# Patient Record
Sex: Female | Born: 1950 | Race: White | Hispanic: No | Smoking: Never smoker
Health system: Southern US, Community
[De-identification: ages and names within clinical notes are randomized; demographics above are authoritative.]

## PROBLEM LIST (undated history)

## (undated) DIAGNOSIS — R232 Flushing: Secondary | ICD-10-CM

## (undated) DIAGNOSIS — R55 Syncope and collapse: Secondary | ICD-10-CM

## (undated) DIAGNOSIS — R5383 Other fatigue: Secondary | ICD-10-CM

## (undated) DIAGNOSIS — M1990 Unspecified osteoarthritis, unspecified site: Secondary | ICD-10-CM

## (undated) DIAGNOSIS — N289 Disorder of kidney and ureter, unspecified: Secondary | ICD-10-CM

## (undated) DIAGNOSIS — Z9289 Personal history of other medical treatment: Secondary | ICD-10-CM

## (undated) DIAGNOSIS — G4730 Sleep apnea, unspecified: Secondary | ICD-10-CM

## (undated) DIAGNOSIS — IMO0002 Reserved for concepts with insufficient information to code with codable children: Secondary | ICD-10-CM

## (undated) DIAGNOSIS — D649 Anemia, unspecified: Secondary | ICD-10-CM

## (undated) DIAGNOSIS — Z87440 Personal history of urinary (tract) infections: Secondary | ICD-10-CM

## (undated) DIAGNOSIS — C801 Malignant (primary) neoplasm, unspecified: Secondary | ICD-10-CM

## (undated) DIAGNOSIS — K56609 Unspecified intestinal obstruction, unspecified as to partial versus complete obstruction: Secondary | ICD-10-CM

## (undated) DIAGNOSIS — Z932 Ileostomy status: Secondary | ICD-10-CM

## (undated) DIAGNOSIS — R9439 Abnormal result of other cardiovascular function study: Secondary | ICD-10-CM

## (undated) DIAGNOSIS — C50919 Malignant neoplasm of unspecified site of unspecified female breast: Secondary | ICD-10-CM

## (undated) DIAGNOSIS — M419 Scoliosis, unspecified: Secondary | ICD-10-CM

## (undated) DIAGNOSIS — K631 Perforation of intestine (nontraumatic): Secondary | ICD-10-CM

## (undated) DIAGNOSIS — M75100 Unspecified rotator cuff tear or rupture of unspecified shoulder, not specified as traumatic: Secondary | ICD-10-CM

## (undated) DIAGNOSIS — T4145XA Adverse effect of unspecified anesthetic, initial encounter: Secondary | ICD-10-CM

## (undated) DIAGNOSIS — Z8719 Personal history of other diseases of the digestive system: Secondary | ICD-10-CM

## (undated) DIAGNOSIS — I10 Essential (primary) hypertension: Secondary | ICD-10-CM

## (undated) DIAGNOSIS — Z923 Personal history of irradiation: Secondary | ICD-10-CM

## (undated) DIAGNOSIS — I809 Phlebitis and thrombophlebitis of unspecified site: Secondary | ICD-10-CM

## (undated) DIAGNOSIS — I2699 Other pulmonary embolism without acute cor pulmonale: Secondary | ICD-10-CM

## (undated) HISTORY — PX: CARDIAC CATHETERIZATION: SHX172

## (undated) HISTORY — DX: Sleep apnea, unspecified: G47.30

## (undated) HISTORY — PX: JOINT REPLACEMENT: SHX530

## (undated) HISTORY — DX: Phlebitis and thrombophlebitis of unspecified site: I80.9

## (undated) HISTORY — PX: APPENDECTOMY: SHX54

## (undated) HISTORY — DX: Scoliosis, unspecified: M41.9

## (undated) HISTORY — DX: Perforation of intestine (nontraumatic): K63.1

## (undated) HISTORY — PX: OTHER SURGICAL HISTORY: SHX169

## (undated) HISTORY — PX: BREAST BIOPSY: SHX20

## (undated) HISTORY — DX: Malignant neoplasm of unspecified site of unspecified female breast: C50.919

## (undated) HISTORY — DX: Flushing: R23.2

## (undated) HISTORY — DX: Unspecified intestinal obstruction, unspecified as to partial versus complete obstruction: K56.609

## (undated) HISTORY — PX: EXPLORATORY LAPAROTOMY: SUR591

## (undated) HISTORY — DX: Unspecified rotator cuff tear or rupture of unspecified shoulder, not specified as traumatic: M75.100

## (undated) HISTORY — DX: Personal history of other medical treatment: Z92.89

## (undated) HISTORY — PX: TOTAL SHOULDER ARTHROPLASTY: SHX126

## (undated) HISTORY — DX: Personal history of other diseases of the digestive system: Z87.19

## (undated) HISTORY — DX: Other pulmonary embolism without acute cor pulmonale: I26.99

## (undated) HISTORY — DX: Other fatigue: R53.83

## (undated) HISTORY — PX: CATARACT EXTRACTION: SUR2

## (undated) HISTORY — DX: Malignant (primary) neoplasm, unspecified: C80.1

## (undated) HISTORY — PX: KNEE ARTHROPLASTY: SHX992

## (undated) HISTORY — DX: Unspecified osteoarthritis, unspecified site: M19.90

---

## 1969-04-24 DIAGNOSIS — I809 Phlebitis and thrombophlebitis of unspecified site: Secondary | ICD-10-CM

## 1969-04-24 DIAGNOSIS — I2699 Other pulmonary embolism without acute cor pulmonale: Secondary | ICD-10-CM

## 1969-04-24 HISTORY — DX: Phlebitis and thrombophlebitis of unspecified site: I80.9

## 1969-04-24 HISTORY — DX: Other pulmonary embolism without acute cor pulmonale: I26.99

## 1971-04-25 HISTORY — PX: COLECTOMY: SHX59

## 1971-04-25 HISTORY — PX: ILEOSTOMY: SHX1783

## 1973-04-24 HISTORY — PX: VAGINOPLASTY: SHX329

## 1994-04-24 DIAGNOSIS — T8859XA Other complications of anesthesia, initial encounter: Secondary | ICD-10-CM

## 1994-04-24 HISTORY — DX: Other complications of anesthesia, initial encounter: T88.59XA

## 1994-04-24 HISTORY — PX: ABDOMINAL HYSTERECTOMY: SHX81

## 1997-07-17 ENCOUNTER — Ambulatory Visit (HOSPITAL_COMMUNITY): Admission: RE | Admit: 1997-07-17 | Discharge: 1997-07-17 | Payer: Self-pay | Admitting: Obstetrics and Gynecology

## 1998-08-24 ENCOUNTER — Encounter: Payer: Self-pay | Admitting: Obstetrics and Gynecology

## 1998-08-24 ENCOUNTER — Ambulatory Visit (HOSPITAL_COMMUNITY): Admission: RE | Admit: 1998-08-24 | Discharge: 1998-08-24 | Payer: Self-pay | Admitting: Obstetrics and Gynecology

## 1999-08-31 ENCOUNTER — Ambulatory Visit (HOSPITAL_COMMUNITY): Admission: RE | Admit: 1999-08-31 | Discharge: 1999-08-31 | Payer: Self-pay | Admitting: *Deleted

## 1999-08-31 ENCOUNTER — Encounter: Payer: Self-pay | Admitting: Internal Medicine

## 2000-10-12 ENCOUNTER — Encounter: Payer: Self-pay | Admitting: Obstetrics and Gynecology

## 2000-10-12 ENCOUNTER — Ambulatory Visit (HOSPITAL_COMMUNITY): Admission: RE | Admit: 2000-10-12 | Discharge: 2000-10-12 | Payer: Self-pay | Admitting: Obstetrics and Gynecology

## 2002-11-25 ENCOUNTER — Encounter: Payer: Self-pay | Admitting: Internal Medicine

## 2002-11-25 ENCOUNTER — Ambulatory Visit (HOSPITAL_COMMUNITY): Admission: RE | Admit: 2002-11-25 | Discharge: 2002-11-25 | Payer: Self-pay | Admitting: Internal Medicine

## 2003-04-25 DIAGNOSIS — M75100 Unspecified rotator cuff tear or rupture of unspecified shoulder, not specified as traumatic: Secondary | ICD-10-CM

## 2003-04-25 HISTORY — DX: Unspecified rotator cuff tear or rupture of unspecified shoulder, not specified as traumatic: M75.100

## 2004-04-24 HISTORY — PX: ROTATOR CUFF REPAIR: SHX139

## 2004-10-04 ENCOUNTER — Ambulatory Visit (HOSPITAL_COMMUNITY): Admission: RE | Admit: 2004-10-04 | Discharge: 2004-10-04 | Payer: Self-pay | Admitting: Obstetrics and Gynecology

## 2004-12-31 ENCOUNTER — Inpatient Hospital Stay (HOSPITAL_COMMUNITY): Admission: RE | Admit: 2004-12-31 | Discharge: 2005-01-02 | Payer: Self-pay | Admitting: Orthopedic Surgery

## 2006-04-04 ENCOUNTER — Ambulatory Visit (HOSPITAL_COMMUNITY): Admission: RE | Admit: 2006-04-04 | Discharge: 2006-04-04 | Payer: Self-pay | Admitting: Obstetrics and Gynecology

## 2007-04-25 DIAGNOSIS — G4730 Sleep apnea, unspecified: Secondary | ICD-10-CM

## 2007-04-25 HISTORY — DX: Sleep apnea, unspecified: G47.30

## 2007-06-11 ENCOUNTER — Ambulatory Visit (HOSPITAL_COMMUNITY): Admission: RE | Admit: 2007-06-11 | Discharge: 2007-06-11 | Payer: Self-pay | Admitting: Obstetrics and Gynecology

## 2009-01-07 ENCOUNTER — Ambulatory Visit (HOSPITAL_COMMUNITY): Admission: RE | Admit: 2009-01-07 | Discharge: 2009-01-07 | Payer: Self-pay | Admitting: Internal Medicine

## 2010-02-09 ENCOUNTER — Ambulatory Visit (HOSPITAL_COMMUNITY): Admission: RE | Admit: 2010-02-09 | Discharge: 2010-02-09 | Payer: Self-pay | Admitting: Obstetrics and Gynecology

## 2010-04-24 DIAGNOSIS — Z9289 Personal history of other medical treatment: Secondary | ICD-10-CM

## 2010-04-24 HISTORY — DX: Personal history of other medical treatment: Z92.89

## 2010-05-15 ENCOUNTER — Encounter: Payer: Self-pay | Admitting: Obstetrics and Gynecology

## 2010-09-09 NOTE — Op Note (Signed)
NAMELAKEDRA, WASHINGTON            ACCOUNT NO.:  [id number]   MEDICAL RECORD NO.:  [id number]          PATIENT TYPE:  AMB   LOCATION:  DAY                          FACILITY:  [facility]   PHYSICIAN:  Tarri Glenn, M.D.  DATE OF BIRTH:  [birth year]   DATE OF PROCEDURE:  12/30/2004  DATE OF DISCHARGE:                                 OPERATIVE REPORT   PREOPERATIVE DIAGNOSES:  1.  Torn rotator cuff.  2.  Degenerative arthritis acromioclavicular joint, right shoulder.   POSTOPERATIVE DIAGNOSES:  1.  Torn rotator cuff.  2.  Degenerative arthritis acromioclavicular joint, right shoulder.   OPERATION:  1.  Anterior acromionectomy and repair of torn rotator cuff.  2.  Open resection distal clavicle right shoulder.   SURGEON:  Tarri Glenn, M.D.   ASSISTANTElodia Florence. Delorse Lek, P.A.-.C.   ANESTHESIA:  General.   INDICATIONS FOR PROCEDURE:  Because of a painful right shoulder, he obtained  an MRI demonstrating the above findings.  The 2 cm of retraction of the  supraspinous tendon were accurate.   DESCRIPTION OF PROCEDURE:  Prophylactic antibiotic, satisfactory general  anesthesia, beach-chair position on the swine frame, right shoulder girdle  was prepped with DuraPrep and draped in a sterile field.  Ioban employed.  A  vertical incision over the distal clavicle curving downward to roughly the  greater tuberosity.  I opened the fascia over the distal clavicle after  identifying the [facility] joint with cutting cautery and measured out the distal 1  to 1.5 cm.  I then undermined the clavicle at this point and protected the  underlying tissues and amputated the clavicle with a microsaw.  Small  spicules of bone were removed from the cut surface which I covered with bone  wax and irrigated well.  I then continued lateralward and undermined the  distal acromion with a small Cobb elevator followed by a larger Cobb  elevator, then protecting the underlying rotator cuff tissues using  microsaw  to make my initial anterior acromionectomy.  I then performed additional  beveling of the underneath surface and also used a small rongeur until she  had a wide decompression.  The subdeltoid bursa was  excised and the large  tear identified.  It was about 1 to 1.5 cm in width and 2 cm of retraction.  The biceps tendon was intact.  After identifying the anatomy of the tear, I  roughened up the humeral head next to the greater tuberosity and used two  super Mitek anchors to splice through the torn retraction portion of the  tear and with the arm abducted reattached the rotator cuff to its original  position and then supplemented the sutures with additional lateral sutures  through the end of the rotator cuff and soft tissue in the lateral humerus.  This gave a nice smooth tight repair.  We checked to be sure there was no  additional impingement.  She had an interscalene block preoperatively and  did not use any Marcaine with Adrenaline.  The wound was irrigated with  sterile saline. Closure performed.  The deltoid muscle and fascia over the  anterior  acromion and distal clavicle was reapproximated with interrupted #1  Vicryl after placing Gelfoam in the resected bone interval.  The  subcutaneous tissue was closed with 2-0 Vicryl, skin with Steri-Strips.  Dry  sterile dressing and short immobilizer were applied.  She tolerated the  procedure well and was taken to recovery in satisfactory condition with no  known complications.           ______________________________  Tarri Glenn, M.D.     JA/MEDQ  D:  12/30/2004  T:  12/30/2004  Job:  [id number]

## 2010-09-09 NOTE — Discharge Summary (Signed)
Deanna Burke, Deanna Burke            ACCOUNT NO.:  [id number]   MEDICAL RECORD NO.:  [id number]          PATIENT TYPE:  INP   LOCATION:  [place]                         FACILITY:  [facility]   PHYSICIAN:  Tarri Glenn, M.D.  DATE OF BIRTH:  [birth year]   DATE OF ADMISSION:  12/30/2004  DATE OF DISCHARGE:  01/02/2005                                 DISCHARGE SUMMARY   ADMISSION DIAGNOSES:  1.  Torn rotator cuff of the right shoulder with degenerative arthritis of      the acromioclavicular joint and chronic, painful impingement to the      right shoulder.  2.  Hypertension.  3.  History of phlebitis.  4.  History of anemia.  5.  Ulcerative colitis.  6.  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  7.  Multiple allergies.   DISCHARGE DIAGNOSES:  1.  Torn rotator cuff of the right shoulder with degenerative arthritis of      the acromioclavicular joint and chronic, painful impingement to the      right shoulder.  2.  Hypertension.  3.  History of phlebitis.  4.  History of anemia.  5.  Ulcerative colitis.  6.  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  7.  Multiple allergies.   OPERATION:  On December 30, 2004, patient underwent repair of the rotator  cuff of the right shoulder, distal clavicle resection of the right shoulder,  and subacromial decompression, all open.  D.L. Underwood assisted.   BRIEF HISTORY:  This 60 year old lady having progressive problems concerning  pain into her right shoulder had interference with her day-to-day activity  due to this pain, lack of range of motion.  After much consideration,  including the findings on MI of torn rotator cuff as well as impingement and  AC arthritis, it was decided to go ahead with the above procedure.   HOSPITAL COURSE:  Patient tolerated the surgical procedure quite well.  She  had a regional block as well as general anesthetic.  Once the block wore  off, however, she had a marked amount of pain and  discomfort.  Intravenous  analgesics were used.  Morphine as well as Demerol IM.  Once her pain was  under control with p.o. analgesics, it was decided that she could be  returned home safely.  The wound was dry.  Neurovascular was intact to the  right upper extremity.  She had instructions from occupational therapy.  She  will not range motion of the right shoulder.  She will return to see [place] in  our office in 10-14 days.  May shower four days after surgery.  Continue  home medications and diet.   Laboratory values in the hospital hematologically showed a CBC with  differential.  The hemoglobin and hematocrit were normal.  Blood chemistries  were also normal.   Chest x-ray showed no active cardiopulmonary disease.   Electrocardiogram showed normal sinus rhythm.   CONDITION ON DISCHARGE:  Improved, stable.   PLAN:  The patient is discharged home to the care of her family.  She is to  continue with her home medications and  diet.  Vicodin will be used for  discomfort.  Robaxin as a muscle relaxer.  She is suggested to call should  she have any problems.      Dooley L. Vanita Ingles.    ______________________________  Tarri Glenn, M.D.    DLU/MEDQ  D:  01/02/2005  T:  01/02/2005  Job:  [id number]   cc:   Theda Belfast. Baird Cancer, M.D.  [address]  [place] [address]  Fax: [phone]

## 2011-02-17 ENCOUNTER — Other Ambulatory Visit (HOSPITAL_COMMUNITY): Payer: Self-pay | Admitting: Obstetrics and Gynecology

## 2011-02-17 DIAGNOSIS — Z1231 Encounter for screening mammogram for malignant neoplasm of breast: Secondary | ICD-10-CM

## 2011-03-22 ENCOUNTER — Ambulatory Visit (HOSPITAL_COMMUNITY)
Admission: RE | Admit: 2011-03-22 | Discharge: 2011-03-22 | Disposition: A | Discharge status: Home / Self Care | Payer: PRIVATE HEALTH INSURANCE | Source: Ambulatory Visit | Attending: Obstetrics and Gynecology | Admitting: Obstetrics and Gynecology

## 2011-03-22 DIAGNOSIS — Z1231 Encounter for screening mammogram for malignant neoplasm of breast: Secondary | ICD-10-CM

## 2011-03-24 ENCOUNTER — Other Ambulatory Visit: Payer: Self-pay | Admitting: Obstetrics and Gynecology

## 2011-03-24 DIAGNOSIS — R928 Other abnormal and inconclusive findings on diagnostic imaging of breast: Secondary | ICD-10-CM

## 2011-04-07 ENCOUNTER — Other Ambulatory Visit: Payer: Self-pay | Admitting: Obstetrics and Gynecology

## 2011-04-07 ENCOUNTER — Ambulatory Visit
Admission: RE | Admit: 2011-04-07 | Discharge: 2011-04-07 | Disposition: A | Discharge status: Home / Self Care | Payer: PRIVATE HEALTH INSURANCE | Source: Ambulatory Visit | Attending: Obstetrics and Gynecology | Admitting: Obstetrics and Gynecology

## 2011-04-07 DIAGNOSIS — R928 Other abnormal and inconclusive findings on diagnostic imaging of breast: Secondary | ICD-10-CM

## 2011-04-19 ENCOUNTER — Ambulatory Visit
Admission: RE | Admit: 2011-04-19 | Discharge: 2011-04-19 | Disposition: A | Discharge status: Home / Self Care | Payer: PRIVATE HEALTH INSURANCE | Source: Ambulatory Visit | Attending: Obstetrics and Gynecology | Admitting: Obstetrics and Gynecology

## 2011-04-19 DIAGNOSIS — R928 Other abnormal and inconclusive findings on diagnostic imaging of breast: Secondary | ICD-10-CM

## 2011-04-20 ENCOUNTER — Other Ambulatory Visit: Payer: Self-pay | Admitting: Obstetrics and Gynecology

## 2011-04-20 DIAGNOSIS — C50912 Malignant neoplasm of unspecified site of left female breast: Secondary | ICD-10-CM

## 2011-04-21 ENCOUNTER — Other Ambulatory Visit: Payer: Self-pay | Admitting: *Deleted

## 2011-04-21 ENCOUNTER — Telehealth: Payer: Self-pay | Admitting: *Deleted

## 2011-04-21 DIAGNOSIS — D0510 Intraductal carcinoma in situ of unspecified breast: Secondary | ICD-10-CM

## 2011-04-21 NOTE — Telephone Encounter (Signed)
Confirmed [facility] for 05/03/11 at 1215.  Instructions and contact information given.

## 2011-04-27 ENCOUNTER — Ambulatory Visit
Admission: RE | Admit: 2011-04-27 | Discharge: 2011-04-27 | Disposition: A | Discharge status: Home / Self Care | Payer: PRIVATE HEALTH INSURANCE | Source: Ambulatory Visit | Attending: Obstetrics and Gynecology | Admitting: Obstetrics and Gynecology

## 2011-04-27 DIAGNOSIS — C50912 Malignant neoplasm of unspecified site of left female breast: Secondary | ICD-10-CM

## 2011-04-27 MED ORDER — GADOBENATE DIMEGLUMINE 529 MG/ML IV SOLN
15.0000 mL | Freq: Once | INTRAVENOUS | Status: AC | PRN
  Administered 2011-04-27: 15 mL via INTRAVENOUS

## 2011-04-28 ENCOUNTER — Other Ambulatory Visit: Payer: Self-pay | Admitting: Obstetrics and Gynecology

## 2011-04-28 DIAGNOSIS — R928 Other abnormal and inconclusive findings on diagnostic imaging of breast: Secondary | ICD-10-CM

## 2011-05-03 ENCOUNTER — Other Ambulatory Visit: Payer: Self-pay | Admitting: Diagnostic Radiology

## 2011-05-03 ENCOUNTER — Ambulatory Visit: Payer: PRIVATE HEALTH INSURANCE

## 2011-05-03 ENCOUNTER — Ambulatory Visit: Payer: PRIVATE HEALTH INSURANCE | Attending: Surgery | Admitting: Physical Therapy

## 2011-05-03 ENCOUNTER — Ambulatory Visit (HOSPITAL_BASED_OUTPATIENT_CLINIC_OR_DEPARTMENT_OTHER): Payer: PRIVATE HEALTH INSURANCE | Admitting: Surgery

## 2011-05-03 ENCOUNTER — Encounter: Payer: Self-pay | Admitting: *Deleted

## 2011-05-03 ENCOUNTER — Ambulatory Visit
Admission: RE | Admit: 2011-05-03 | Discharge: 2011-05-03 | Disposition: A | Discharge status: Home / Self Care | Payer: PRIVATE HEALTH INSURANCE | Source: Ambulatory Visit | Attending: Obstetrics and Gynecology | Admitting: Obstetrics and Gynecology

## 2011-05-03 ENCOUNTER — Ambulatory Visit (HOSPITAL_BASED_OUTPATIENT_CLINIC_OR_DEPARTMENT_OTHER): Payer: PRIVATE HEALTH INSURANCE | Admitting: Oncology

## 2011-05-03 ENCOUNTER — Encounter: Payer: Self-pay | Admitting: Specialist

## 2011-05-03 ENCOUNTER — Ambulatory Visit
Admission: RE | Admit: 2011-05-03 | Discharge: 2011-05-03 | Disposition: A | Discharge status: Home / Self Care | Payer: PRIVATE HEALTH INSURANCE | Source: Ambulatory Visit | Attending: Radiation Oncology | Admitting: Radiation Oncology

## 2011-05-03 ENCOUNTER — Other Ambulatory Visit (HOSPITAL_BASED_OUTPATIENT_CLINIC_OR_DEPARTMENT_OTHER): Payer: PRIVATE HEALTH INSURANCE | Admitting: Lab

## 2011-05-03 VITALS — BP 144/78 | HR 103 | Temp 97.9°F | Ht 65.0 in | Wt 172.8 lb

## 2011-05-03 DIAGNOSIS — D0590 Unspecified type of carcinoma in situ of unspecified breast: Secondary | ICD-10-CM

## 2011-05-03 DIAGNOSIS — R928 Other abnormal and inconclusive findings on diagnostic imaging of breast: Secondary | ICD-10-CM

## 2011-05-03 DIAGNOSIS — Z7982 Long term (current) use of aspirin: Secondary | ICD-10-CM | POA: Insufficient documentation

## 2011-05-03 DIAGNOSIS — Z170 Estrogen receptor positive status [ER+]: Secondary | ICD-10-CM

## 2011-05-03 DIAGNOSIS — G4730 Sleep apnea, unspecified: Secondary | ICD-10-CM | POA: Insufficient documentation

## 2011-05-03 DIAGNOSIS — D0510 Intraductal carcinoma in situ of unspecified breast: Secondary | ICD-10-CM

## 2011-05-03 DIAGNOSIS — Z79899 Other long term (current) drug therapy: Secondary | ICD-10-CM | POA: Insufficient documentation

## 2011-05-03 DIAGNOSIS — Z01818 Encounter for other preprocedural examination: Secondary | ICD-10-CM | POA: Insufficient documentation

## 2011-05-03 DIAGNOSIS — R293 Abnormal posture: Secondary | ICD-10-CM | POA: Insufficient documentation

## 2011-05-03 DIAGNOSIS — Z803 Family history of malignant neoplasm of breast: Secondary | ICD-10-CM | POA: Insufficient documentation

## 2011-05-03 DIAGNOSIS — Z86711 Personal history of pulmonary embolism: Secondary | ICD-10-CM | POA: Insufficient documentation

## 2011-05-03 DIAGNOSIS — M79609 Pain in unspecified limb: Secondary | ICD-10-CM | POA: Insufficient documentation

## 2011-05-03 DIAGNOSIS — Z510 Encounter for antineoplastic radiation therapy: Secondary | ICD-10-CM | POA: Insufficient documentation

## 2011-05-03 DIAGNOSIS — C50919 Malignant neoplasm of unspecified site of unspecified female breast: Secondary | ICD-10-CM | POA: Insufficient documentation

## 2011-05-03 DIAGNOSIS — Z800 Family history of malignant neoplasm of digestive organs: Secondary | ICD-10-CM | POA: Insufficient documentation

## 2011-05-03 HISTORY — PX: BREAST SURGERY: SHX581

## 2011-05-03 LAB — CBC WITH DIFFERENTIAL/PLATELET
Eosinophils Absolute: 0.1 10*3/uL (ref 0.0–0.5)
HCT: 37 % (ref 34.8–46.6)
MCV: 89.8 fL (ref 79.5–101.0)
MONO#: 0.3 10*3/uL (ref 0.1–0.9)
NEUT#: 4.6 10*3/uL (ref 1.5–6.5)
NEUT%: 78.9 % — ABNORMAL HIGH (ref 38.4–76.8)
Platelets: 279 10*3/uL (ref 145–400)
RDW: 11.9 % (ref 11.2–14.5)
lymph#: 0.9 10*3/uL (ref 0.9–3.3)

## 2011-05-03 LAB — COMPREHENSIVE METABOLIC PANEL
Albumin: 3.7 g/dL (ref 3.5–5.2)
Alkaline Phosphatase: 83 U/L (ref 39–117)
BUN: 29 mg/dL — ABNORMAL HIGH (ref 6–23)
CO2: 27 mEq/L (ref 19–32)
Glucose, Bld: 107 mg/dL — ABNORMAL HIGH (ref 70–99)
Potassium: 3.6 mEq/L (ref 3.5–5.3)

## 2011-05-03 MED ORDER — GADOBENATE DIMEGLUMINE 529 MG/ML IV SOLN: 14.0000 mL | Freq: Once | INTRAVENOUS | Status: DC | PRN

## 2011-05-03 NOTE — Progress Notes (Signed)
Met pt at multi-disciplinary clinic.  Explained available support services, including breast cancer support group and [facility].

## 2011-05-03 NOTE — Progress Notes (Signed)
Patient ID: Deanna Burke, female   DOB: 1951-01-01, 61 y.o.   MRN: [id number]  Chief Complaint  Patient presents with  . Other    DCIS left breast    HPI Deanna Burke is a 61 y.o. female.  HPIThis is a pleasant female referred by Deanna Chessman PA for evaluation of a recent finding of ductal carcinoma in situ of the left breast. She actually has a separate lesion it has been demonstrated on MRI of the left breast and that lesion was just biopsied today. She has no previous history of breast biopsies or breast malignancies. She denies any nipple discharge. She is otherwise without complaints.  No past medical history on file.  No past surgical history on file.  No family history on file.  Social History History  Substance Use Topics  . Smoking status: Not on file  . Smokeless tobacco: Not on file  . Alcohol Use: Not on file    Allergies  Allergen Reactions  . Demerol Nausea And Vomiting  . Dilaudid (Hydromorphone Hcl) Nausea And Vomiting  . Stadol (Butorphanol Tartrate) Nausea And Vomiting  . Amoxicillin Rash  . Keflex Rash  . Penicillins Rash    No current outpatient prescriptions on file.   No current facility-administered medications for this visit.   Facility-Administered Medications Ordered in Other Visits  Medication Dose Route Frequency Provider Last Rate Last Dose  . gadobenate dimeglumine (MULTIHANCE) injection 14 mL  14 mL Intravenous Once PRN Medication Radiologist        Review of Systems Review of Systems  Constitutional: Negative for fever, chills and unexpected weight change.  HENT: Negative for hearing loss, congestion, sore throat, trouble swallowing and voice change.   Eyes: Negative for visual disturbance.  Respiratory: Negative for cough and wheezing.   Cardiovascular: Negative for chest pain, palpitations and leg swelling.  Gastrointestinal: Negative for nausea, vomiting, abdominal pain, diarrhea, constipation, blood in stool, abdominal  distention and anal bleeding.  Genitourinary: Positive for dysuria. Negative for hematuria, vaginal bleeding and difficulty urinating.  Musculoskeletal: Positive for back pain and joint swelling. Negative for arthralgias.  Skin: Negative for rash and wound.  Neurological: Negative for seizures, syncope and headaches.  Hematological: Negative for adenopathy. Does not bruise/bleed easily.  Psychiatric/Behavioral: Negative for confusion.    There were no vitals taken for this visit.  Physical Exam Physical Exam  Constitutional: She is oriented to person, place, and time. She appears well-developed and well-nourished. No distress.  HENT:  Head: Normocephalic and atraumatic.  Right Ear: External ear normal.  Left Ear: External ear normal.  Nose: Nose normal.  Mouth/Throat: Oropharynx is clear and moist. No oropharyngeal exudate.  Eyes: Conjunctivae are normal. Pupils are equal, round, and reactive to light. Right eye exhibits no discharge. Left eye exhibits no discharge. No scleral icterus.  Neck: Normal range of motion. Neck supple. No tracheal deviation present. No thyromegaly present.  Cardiovascular: Normal rate, regular rhythm, normal heart sounds and intact distal pulses.   No murmur heard. Pulmonary/Chest: Effort normal and breath sounds normal. No respiratory distress. She has no wheezes.  Abdominal: Soft. Bowel sounds are normal. She exhibits no distension. There is no tenderness.  Musculoskeletal: Normal range of motion. She exhibits no edema and no tenderness.  Lymphadenopathy:    She has no cervical adenopathy.    She has no axillary adenopathy.  Neurological: She is alert and oriented to person, place, and time.  Skin: Skin is warm and dry. No rash noted. No erythema.  Psychiatric: Her behavior is normal. Judgment normal.  Breasts are examined bilaterally. There are no palpable masses in either breast. Both her stereotactic biopsy sites on the left breast are healing well  with only minor scanning. Areola and nipples are normal  Data Reviewed We have reviewed the MRI, mammogram, biopsy results etc. Again, there are see suspicious areas on her MRI. Where the biopsies had shown ductal carcinoma in situ with necrosis.She is ER and PR positive.  Assessment     Ductal carcinoma in situ of the left breast the second lesion and biopsy pending.    Plan    Definitive plans for surgery will be made when the second biopsy result was noted. If it is benign, then we will proceed with a needle localized lumpectomy and possible sentinel lymph node biopsy. If the lesion is positive, I want to see whether we can proceed with a lumpectomy including but the specimens versus a mastectomy. I color back when we know the results of the second biopsy.       Jaylynne Birkhead A 05/03/2011, 1:43 PM

## 2011-05-03 NOTE — Progress Notes (Signed)
Deanna Burke  DOB: 13-Jul-1950  MR#: [id number]  CSN#: [id number]    History of present illness:   Deanna Burke is a 61 year old [place] woman with a new diagnosis of breast cancer.  She had routine screening mammography 03/22/2011 at [facility]. This showed new microcalcifications in the left breast. She was recalled for additional views December 14, and these showed the calcifications to be pleomorphic and linear. Stereotactic biopsy was performed 04/19/2011, and showed [phone] intermediate to high-grade ductal carcinoma in situ, estrogen receptor 100% positive, progesterone receptor 50% positive.  With this information the patient was referred for bilateral breast MRI, performed 04/27/2011. This showed no suspicious findings on the right. On the left there was 11 mm lobulated mass 5 cm from the prior biopsy clip. This second mass was biopsied today, but results are not yet available.  The patient presented to the multidisciplinary breast cancer clinic 05/03/2011 for definitive treatment plan.  Past medical history:      Past Medical History  Diagnosis Date  . Ulcerative colitis [phone]  . Pulmonary embolus 1971  . Phlebitis 1971  . Small bowel obstruction [id number]  . Small bowel perforation 1971/1996  . Large bowel perforation 1971/1996  . Scoliosis   . Torn rotator cuff 2005  . Sleep apnea 2009  . History of bone density study 2012  . Fatigue   . Cataract     Left Eye  . Arthritis   . Hot flashes   the patient underwent a total colectomy with ileostomy placement 1973 for her ulcerative colitis. She has done very well since.  Past surgical history:      Past Surgical History  Procedure Date  . Colectomy 1973  . Ileostomy 1973  . Vaginoplasty 1975  . Exploratory laparotomy 1978/1990    with lysis of adhesions  . Abdominal hysterectomy 1996    partial  status post appendectomy, status post hysterectomy with bilateral  salpingo-oophorectomy.  Family history:     Family History  Problem Relation Age of Onset  . Breast cancer Maternal Grandmother   . Stomach cancer Maternal Grandfather   the patient's father died at the age of 34 from congestive heart failure. The patient's mother is alive at age 47. She had one brothers no sisters. There is no history of breast or ovarian cancer in the immediate family.  Gynecologic history:menarche age 72, underwent hysterectomy and bilateral salpingo-oophorectomy in 1996. She took hormone replacement for 16 years, stopping only at the time of this diagnosis. She is GX P0.    Social history:   She is a retired Arts development officer Investment banker, corporate). She is divorced. She lives by herself, with no pets. Her good friend, Deanna Burke is present today.    ADVANCED DIRECTIVES: no  Health maintenance:       History  Substance Use Topics  . Smoking status: Never Smoker   . Smokeless tobacco: Not on file  . Alcohol Use: No      Colonoscopy:never  PAP:  Bone density: normal 2007  Cholesterol: about 200, with a high HDL  Review of systems:  She is occasionally a little fatigued, but it does not affect her daily activities. As she is having cataract surgery in the left eye this week. She has a history of urinary tract infections and kidney infections and occasionally has pain with urination. She has some back pain and joint pain with some arthritis, but these are not either more intense or more frequent than before. She does have hot  flashes which she describes as moderate. A detailed review of systems was otherwise noncontributory.  Allergies:     Allergies  Allergen Reactions  . Demerol Nausea And Vomiting  . Dilaudid (Hydromorphone Hcl) Nausea And Vomiting  . Stadol (Butorphanol Tartrate) Nausea And Vomiting  . Amoxicillin Rash  . Keflex Rash  . Penicillins Rash    Medications:      Current Outpatient Prescriptions  Medication Sig Dispense Refill  . aspirin 325 MG tablet  Take 325 mg by mouth daily.      . calcium carbonate (OS-CAL) 600 MG TABS Take 600 mg by mouth 2 (two) times daily with a meal.      . ciprofloxacin (CIPRO) 500 MG tablet Take 500 mg by mouth 2 (two) times daily.      . ferrous fumarate (HEMOCYTE - 106 MG FE) 325 (106 FE) MG TABS Take 1 tablet by mouth.      . fish oil-omega-3 fatty acids 1000 MG capsule Take 1 g by mouth daily.      . Multiple Vitamins-Minerals (MULTIVITAMIN WITH MINERALS) tablet Take 1 tablet by mouth daily.      [name] triamterene-hydrochlorothiazide (DYAZIDE) 37.5-25 MG per capsule Take 1 capsule by mouth every morning.      . vitamin E 400 UNIT capsule Take 400 Units by mouth daily.       No current facility-administered medications for this visit.   Facility-Administered Medications Ordered in Other Visits  Medication Dose Route Frequency Provider Last Rate Last Dose  . gadobenate dimeglumine (MULTIHANCE) injection 14 mL  14 mL Intravenous Once PRN Medication Radiologist        Physical exam:  Middle-aged white woman who appears comfortable    Filed Vitals:   05/03/11 1314  BP: 144/78  Pulse: 103  Temp: 97.9 F (36.6 C)     Body mass index is 28.76 kg/(m^2).  ECOG PS: 0  Sclerae unicteric Oropharynx clear No peripheral adenopathy Lungs no rales or rhonchi Heart regular rate and rhythm Abd benign, status post multiple surgeries. No masses palpated. MSK no focal spinal tenderness, no peripheral edema Neuro: nonfocal Breasts: right breast no suspicious findings. Left breast, status post recent biopsy. There is minimal ecchymosis. There is no skin change of concern.   Lab results:      CBC  Lab 05/03/11 1240  WBC 5.8  HGB 12.8  HCT 37.0  PLT 279  MCV 89.8  MCH 31.0  MCHC 34.6  RDW 11.9  LYMPHSABS 0.9  MONOABS 0.3  EOSABS 0.1  BASOSABS 0.0  BANDABS --    Chemistries   Lab 05/03/11 1240  NA 137  K 3.6  CL 100  CO2 27  GLUCOSE 107*  BUN 29*  CREATININE 1.24*  CALCIUM 9.9  MG --    Studies:   Mr Breast Bilateral W Wo Contrast  04/27/2011  *RADIOLOGY REPORT*  Clinical Data: new diagnosis of DCIS left breast upper outer quadrant  BUN and creatinine were obtained on site at [facility] at [address] Results:  BUN 25 mg/dL,  Creatinine 1.1 mg/dL.  BILATERAL BREAST MRI WITH AND WITHOUT CONTRAST  Technique: Multiplanar, multisequence MR images of both breasts were obtained prior to and following the intravenous administration of ml of .  Three dimensional images were evaluated at the independent DynaCad workstation.  Comparison:  mammograms dated 03/22/11 and subsequent diagnostic and biopsy-related images  Findings: There is mild background parenchymal enhancement bilaterally.  There no suspicious findings on the right.  There is a normal subcentimeter intramammary lymph node on the right in the 11:oo position at the middle third depth.  On the left, there is a marker clip in the 2:00 position at the middle third depth, and this is associated with an anticipated core needle biopsy tract leading to the skin.  5cm posterior, medial, and inferior to the clip is an 11 x 30m lobulated mass showing moderate initial enhancement and plateau type kinetics.  There is a normal intramammary lymph node about 1.5cm inferolateral to this.  IMPRESSION: Focal enhancing mass in the left breast.  BI-RADS CATEGORY 4:  Suspicious abnormality - biopsy should be considered.  RECOMMENDATION:  MRI-guided biopsy of the left breast mass.  THREE-DIMENSIONAL MR IMAGE RENDERING ON INDEPENDENT WORKSTATION:  Three-dimensional MR images were rendered by post-processing of the original MR data on an independent workstation.  The three- dimensional MR images were interpreted, and findings were reported in the accompanying complete MRI report for this study.  Original Report Authenticated By: [email or web address]  Assessment: 61year old [place] status post left breast biopsy December of 2012 for a grade 2 or 3  ductal carcinoma in situ, estrogen and progesterone receptor positive; with a second area noted by MRI in the same breast, status post biopsy 05/02/2010, with results pending.    Plan: we spent the better part of 3 hour-long visit today discussing the biology of her tumor, her prognosis, and treatment options. She understands that none invasive breast cancers generally are not life-threatening. She does have a second lesion it may or may not be possible to keep her breast. If she has to have a mastectomy she May consider immediate reconstruction. If breast conservation therapy is possible, of course she will need adjuvant radiation treatment.  If her cancer proves to be noninvasive and there is only 1 lesion I think she would be a good candidate for B-43--we discussed the study in detail and we will alert our research nurses to contact the patient.  From a systemic point of view I think she would be a good candidate for tamoxifen, since she does not have a uterus and she took hormone replacement for many years with no clotting issues. We will make this decision however when she returns to see me in 4-6 weeks. By that time we should have all the pathology available and a decision will have been made regarding radiation therapy.   Shanora Christensen C 05/03/2011

## 2011-05-03 NOTE — Progress Notes (Signed)
[facility] NEW PATIENT EVALUATION  Name: Deanna Burke MRN: [id number]  Date: 05/03/2011  DOB: 03-12-1951  Status: outpatient   CC: No primary provider on file.  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     HISTORY OF PRESENT ILLNESS:  Deanna Burke is a 61 y.o. 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 She was in her usual state of health when she underwent her yearly screening mammogram. She had not palpated anything in her breast. The mammogram demonstrated suspicious microcalcifications in the left breast. The right breast appeared benign. The study was on 03/22/2011. She then underwent additional diagnostic mammogram of the left breast on 04/07/2011. This demonstrated suspicious microcalcifications in the upper outer left breast. She then underwent biopsy on 04/19/2011 by stereotactic guidance. The pathology demonstrated ductal carcinoma in situ with calcifications. This was ER and PR positive. She underwent an MRI of her breasts on 04/26/2010 and this demonstrated a marker clip in the 2:00 position which was associated with the core needle biopsy chopped from her biopsy. However, additionally, there was an 11 x 6 mm lobulated mass that was 5 cm posterior, medial, and inferior to the clinic. This area was biopsied today and the pathology is pending.  She has met with Dr. Rush Farmer and he has discussed surgical options with her. If the second site is found to be benign, and she is a candidate for lumpectomy. However she may need a mastectomy if both areas demonstrated malignant cells, since they are 5 cm apart. However, there is some chance that she can undergo 2 lumpectomies with acceptable cosmesis. This will require further review of her MRI with radiology.  PREVIOUS RADIATION THERAPY: No   PAST MEDICAL  HISTORY:  has a past medical history of Ulcerative colitis (4235-3614); Pulmonary embolus (1971); Phlebitis (1971); Small bowel obstruction (4315-4008); Small bowel perforation (1971/1996); Large bowel perforation (1971/1996); Scoliosis; Torn rotator cuff (2005); Sleep apnea (2009); History of bone density study (2012); Fatigue; Cataract; Arthritis; and Hot flashes.     PAST SURGICAL HISTORY:  Past Surgical History  Procedure Date  . Colectomy 1973  . Ileostomy 1973  . Vaginoplasty 1975  . Exploratory laparotomy 1978/1990    with lysis of adhesions  . Abdominal hysterectomy 1996    partial   Gynecologic history: Age of first menstrual period was age 83; she had her last period in 21. She took hormone replacement therapy for 16 years, but she stopped this on 04/21/2011. She has not carried any children to term. She used birth control from [place].  FAMILY HISTORY: family history includes Breast cancer in her maternal grandmother and Stomach cancer in her maternal grandfather.   SOCIAL HISTORY:  reports that she has never smoked. She does not have any smokeless tobacco history on file. She reports that she does not drink alcohol or use illicit drugs.   ALLERGIES: Demerol; Dilaudid; Stadol; Amoxicillin; Keflex; and Penicillins   MEDICATIONS:  Current Outpatient Prescriptions  Medication Sig Dispense Refill  . aspirin 325 MG tablet Take 325 mg by mouth daily.      . calcium carbonate (OS-CAL) 600 MG TABS Take 600 mg by mouth 2 (two) times daily with a meal.      . ciprofloxacin (CIPRO) 500 MG tablet Take 500 mg by mouth 2 (two) times daily.      [name]  ferrous fumarate (HEMOCYTE - 106 MG FE) 325 (106 FE) MG TABS Take 1 tablet by mouth.      . fish oil-omega-3 fatty acids 1000 MG capsule Take 1 g by mouth daily.      . Multiple Vitamins-Minerals (MULTIVITAMIN WITH MINERALS) tablet Take 1 tablet by mouth daily.      [name] triamterene-hydrochlorothiazide (DYAZIDE) 37.5-25 MG per capsule  Take 1 capsule by mouth every morning.      . vitamin E 400 UNIT capsule Take 400 Units by mouth daily.       No current facility-administered medications for this encounter.   Facility-Administered Medications Ordered in Other Encounters  Medication Dose Route Frequency Provider Last Rate Last Dose  . gadobenate dimeglumine (MULTIHANCE) injection 14 mL  14 mL Intravenous Once PRN Medication Radiologist          REVIEW OF SYSTEMS: review of systems is positive for loss of sleep, fatigue, change in vision, urinary tract infections and hot flashes    PHYSICAL EXAM:   General: Alert and oriented, in no acute distress HEENT: Head is normocephalic. Pupils are equally round and reactive to light. Extraocular movements are intact. Oropharynx is clear. Neck: Neck is supple, no palpable cervical or supraclavicular lymphadenopathy. Heart: Regular in rate and rhythm with no murmurs, rubs, or gallops. Chest: Clear to auscultation bilaterally, with no rhonchi, wheezes, or rales. Abdomen: Soft, nontender, nondistended, with no rigidity or guarding. Extremities: No cyanosis or edema. Lymphatics: No concerning lymphadenopathy. Skin: No concerning lesions. Musculoskeletal: symmetric strength and muscle tone throughout. Neurologic: Cranial nerves II through XII are grossly intact. No obvious focalities. Speech is fluent. Coordination is intact. Psychiatric: Judgment and insight are intact. Affect is appropriate. Breast exam demonstrates a healing biopsy scar in the approximate 3:00 position of the left breast. Her breast is very sore and I did not palpate it much at all as she just had her biopsy today. The right breast demonstrates no palpable lesions. There is no palpable axillary adenopathy on either side.     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 She has an adjacent lesion that is about 5 cm from the proven biopsy site which was biopsied today as well in the left breast. The pathology is pending. Depending on the pathology, we will be able to tell if she will be a good candidate for breast conservation. She is interested in breast conservation, but she understands that her cosmetic outcome may be suboptimal if both abnormal sites seen on MRI need to be resected.  I talked to her about lumpectomy versus mastectomy as curative treatment for her cancer. Since she is interested in breast conservation, I explained to her that in the setting of breast conservation radiotherapy plays an important role in decreasing the risk of a local recurrence in the breast. I explained that radiotherapy to the whole  breast would take place over approximately 6 weeks. The main side effects would be fatigue and skin irritation in the acute setting. I will review the results of her pathology with her breast cancer team and be happy to see the patient in followup after surgery if she undergoes breast conservation. All the patient's questions were answered today and she has my contact information she has any further questions the future. It was a pleasure meeting her.

## 2011-05-08 ENCOUNTER — Other Ambulatory Visit (INDEPENDENT_AMBULATORY_CARE_PROVIDER_SITE_OTHER): Payer: Self-pay | Admitting: Surgery

## 2011-05-08 DIAGNOSIS — D0510 Intraductal carcinoma in situ of unspecified breast: Secondary | ICD-10-CM

## 2011-05-08 HISTORY — PX: EYE SURGERY: SHX253

## 2011-05-09 ENCOUNTER — Encounter: Payer: Self-pay | Admitting: *Deleted

## 2011-05-09 NOTE — Progress Notes (Signed)
Mailed after appt letter to pt. 

## 2011-05-10 ENCOUNTER — Other Ambulatory Visit (INDEPENDENT_AMBULATORY_CARE_PROVIDER_SITE_OTHER): Payer: Self-pay | Admitting: Surgery

## 2011-05-10 DIAGNOSIS — D0510 Intraductal carcinoma in situ of unspecified breast: Secondary | ICD-10-CM

## 2011-05-11 ENCOUNTER — Telehealth: Payer: Self-pay | Admitting: *Deleted

## 2011-05-11 NOTE — Telephone Encounter (Signed)
Left VM concerning [facility] from 05/03/11.

## 2011-05-18 ENCOUNTER — Encounter (HOSPITAL_COMMUNITY): Payer: Self-pay | Admitting: Pharmacy Technician

## 2011-05-24 ENCOUNTER — Other Ambulatory Visit: Payer: Self-pay

## 2011-05-24 ENCOUNTER — Encounter (HOSPITAL_COMMUNITY)
Admission: RE | Admit: 2011-05-24 | Discharge: 2011-05-24 | Disposition: A | Discharge status: Home / Self Care | Payer: PRIVATE HEALTH INSURANCE | Source: Ambulatory Visit | Attending: Surgery | Admitting: Surgery

## 2011-05-24 ENCOUNTER — Ambulatory Visit (HOSPITAL_COMMUNITY)
Admission: RE | Admit: 2011-05-24 | Discharge: 2011-05-24 | Disposition: A | Discharge status: Home / Self Care | Payer: PRIVATE HEALTH INSURANCE | Source: Ambulatory Visit | Attending: Anesthesiology | Admitting: Anesthesiology

## 2011-05-24 ENCOUNTER — Encounter (HOSPITAL_COMMUNITY): Payer: Self-pay

## 2011-05-24 DIAGNOSIS — D0510 Intraductal carcinoma in situ of unspecified breast: Secondary | ICD-10-CM

## 2011-05-24 DIAGNOSIS — Z01812 Encounter for preprocedural laboratory examination: Secondary | ICD-10-CM | POA: Insufficient documentation

## 2011-05-24 DIAGNOSIS — Z01818 Encounter for other preprocedural examination: Secondary | ICD-10-CM | POA: Insufficient documentation

## 2011-05-24 DIAGNOSIS — Z01810 Encounter for preprocedural cardiovascular examination: Secondary | ICD-10-CM | POA: Insufficient documentation

## 2011-05-24 HISTORY — DX: Adverse effect of unspecified anesthetic, initial encounter: T41.45XA

## 2011-05-24 HISTORY — DX: Essential (primary) hypertension: I10

## 2011-05-24 LAB — BASIC METABOLIC PANEL
CO2: 31 mEq/L (ref 19–32)
Chloride: 97 mEq/L (ref 96–112)
Potassium: 3.3 mEq/L — ABNORMAL LOW (ref 3.5–5.1)
Sodium: 141 mEq/L (ref 135–145)

## 2011-05-24 LAB — SURGICAL PCR SCREEN: Staphylococcus aureus: NEGATIVE

## 2011-05-24 LAB — CBC
HCT: 36.2 % (ref 36.0–46.0)
MCV: 87.4 fL (ref 78.0–100.0)
RBC: 4.14 MIL/uL (ref 3.87–5.11)
WBC: 5.6 10*3/uL (ref 4.0–10.5)

## 2011-05-24 NOTE — Anesthesia Preprocedure Evaluation (Addendum)
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  She is very fearful of recurrence with surgery.    Endo/Other    Renal/GU      Musculoskeletal   Abdominal   Peds  Hematology   Anesthesia Other Findings   Reproductive/Obstetrics                        Anesthesia Physical Anesthesia Plan  ASA: III  Anesthesia Plan: General   Post-op Pain Management:    Induction: Intravenous  Airway Management Planned: LMA  Additional Equipment:   Intra-op Plan:   Post-operative Plan: Extubation in OR  Informed Consent: I have reviewed the patients History and Physical, chart, labs and discussed the procedure including the risks, benefits and alternatives for the proposed anesthesia with the patient or authorized representative who has indicated his/her understanding and acceptance.   Dental advisory given  Plan Discussed with: CRNA, Anesthesiologist and Surgeon  Anesthesia Plan Comments:         Anesthesia Quick Evaluation

## 2011-05-24 NOTE — Pre-Procedure Instructions (Addendum)
[place]  05/24/2011   Your procedure is scheduled on:  May 31, 2011 (Wed)  Report to [facility] at [place] AM.  Call this number if you have problems the morning of surgery: [phone]   Remember:   Do not eat food:After Midnight.  May have clear liquids: up to 4 Hours before arrival.  Clear liquids include soda, tea, black coffee, apple or grape juice, broth.  Take these medicines the morning of surgery with A SIP OF WATER: hemocyte, Stop fish oil, ibuprofen   Do not wear jewelry, make-up or nail polish.  Do not wear lotions, powders, or perfumes. You may wear deodorant.  Do not shave 48 hours prior to surgery.  Do not bring valuables to the hospital.  Contacts, dentures or bridgework may not be worn into surgery.  Leave suitcase in the car. After surgery it may be brought to your room.  For patients admitted to the hospital, checkout time is 11:00 AM the day of discharge.   Patients discharged the day of surgery will not be allowed to drive home.  Name and phone number of your driver: Thera Flake  [phone]  Special Instructions: CHG Shower Use Special Wash: 1/2 bottle night before surgery and 1/2 bottle morning of surgery.   Please read over the following fact sheets that you were given: Pain Booklet, MRSA Information and Surgical Site Infection Prevention

## 2011-05-24 NOTE — Pre-Procedure Instructions (Deleted)
[place]  05/24/2011   Your procedure is scheduled on:  2.6.13  Report to [facility] at730am or when finish at breast clinic  Call this number if you have problems the morning of surgery: [phone]   Remember:   Do not eat food:After Midnight.  May have clear liquids: up to 4 Hours before arrival.  Clear liquids include soda, tea, black coffee, apple or grape juice, broth.  Take these medicines the morning of surgery with A SIP OF WATER: none              STOP fish oil, ibuprofen, multi vit, vit E TODAY  Do not wear jewelry, make-up or nail polish.  Do not wear lotions, powders, or perfumes. You may wear deodorant.  Do not shave 48 hours prior to surgery.  Do not bring valuables to the hospital.  Contacts, dentures or bridgework may not be worn into surgery.  Leave suitcase in the car. After surgery it may be brought to your room.  For patients admitted to the hospital, checkout time is 11:00 AM the day of discharge.   Patients discharged the day of surgery will not be allowed to drive home.  Name and phone number of your driver:   Special Instructions: CHG Shower Use Special Wash: 1/2 bottle night before surgery and 1/2 bottle morning of surgery.   Please read over the following fact sheets that you were given: Pain Booklet, Coughing and Deep Breathing, MRSA Information and Surgical Site Infection Prevention

## 2011-05-25 ENCOUNTER — Telehealth (INDEPENDENT_AMBULATORY_CARE_PROVIDER_SITE_OTHER): Payer: Self-pay

## 2011-05-25 NOTE — Telephone Encounter (Signed)
Patient is scheduled for surgery next week.  She wants you to know that in 1996 her small bowel ruptured and she had emergency hysterectomy.  She has an ileostomy.  Postop she had an ileus due to  the anesthesia and her ileostomy wasn't functioning right away.  She wants you to know that in considering her type of anesthesia.  In recovery she needs her ileostomy to function before she goes home.  She doesn't want to get home and have a blockage.  She said she may need to be given food that she knows will go through her ostomy.

## 2011-05-30 MED ORDER — CIPROFLOXACIN IN D5W 400 MG/200ML IV SOLN
400.0000 mg | INTRAVENOUS | Status: AC
  Administered 2011-05-31: 400 mg via INTRAVENOUS
  Filled 2011-05-30: qty 200

## 2011-05-30 NOTE — H&P (Signed)
Chief Complaint   Patient presents with   .  Other     DCIS left breast    HPI  Deanna Burke is Burke 61 y.o. female.  HPIThis is Burke pleasant female referred by Halford Chessman PA for evaluation of Burke recent finding of ductal carcinoma in situ of the left breast. She actually has Burke separate lesion it has been demonstrated on MRI of the left breast and that lesion was just biopsied today. She has no previous history of breast biopsies or breast malignancies. She denies any nipple discharge. She is otherwise without complaints.  No past medical history on file.  No past surgical history on file.  No family history on file.  Social History  History   Substance Use Topics   .  Smoking status:  Not on file   .  Smokeless tobacco:  Not on file   .  Alcohol Use:  Not on file    Allergies   Allergen  Reactions   .  Demerol  Nausea And Vomiting   .  Dilaudid (Hydromorphone Hcl)  Nausea And Vomiting   .  Stadol (Butorphanol Tartrate)  Nausea And Vomiting   .  Amoxicillin  Rash   .  Keflex  Rash   .  Penicillins  Rash    No current outpatient prescriptions on file.    No current facility-administered medications for this visit.    Facility-Administered Medications Ordered in Other Visits   Medication  Dose  Route  Frequency  Provider  Last Rate  Last Dose   .  gadobenate dimeglumine (MULTIHANCE) injection 14 mL  14 mL  Intravenous  Once PRN  Medication Radiologist      Review of Systems  Review of Systems  Constitutional: Negative for fever, chills and unexpected weight change.  HENT: Negative for hearing loss, congestion, sore throat, trouble swallowing and voice change.  Eyes: Negative for visual disturbance.  Respiratory: Negative for cough and wheezing.  Cardiovascular: Negative for chest pain, palpitations and leg swelling.  Gastrointestinal: Negative for nausea, vomiting, abdominal pain, diarrhea, constipation, blood in stool, abdominal distention and anal bleeding.  Genitourinary:  Positive for dysuria. Negative for hematuria, vaginal bleeding and difficulty urinating.  Musculoskeletal: Positive for back pain and joint swelling. Negative for arthralgias.  Skin: Negative for rash and wound.  Neurological: Negative for seizures, syncope and headaches.  Hematological: Negative for adenopathy. Does not bruise/bleed easily.  Psychiatric/Behavioral: Negative for confusion.   There were no vitals taken for this visit.  Physical Exam  Physical Exam  Constitutional: She is oriented to person, place, and time. She appears well-developed and well-nourished. No distress.  HENT:  Head: Normocephalic and atraumatic.  Right Ear: External ear normal.  Left Ear: External ear normal.  Nose: Nose normal.  Mouth/Throat: Oropharynx is clear and moist. No oropharyngeal exudate.  Eyes: Conjunctivae are normal. Pupils are equal, round, and reactive to light. Right eye exhibits no discharge. Left eye exhibits no discharge. No scleral icterus.  Neck: Normal range of motion. Neck supple. No tracheal deviation present. No thyromegaly present.  Cardiovascular: Normal rate, regular rhythm, normal heart sounds and intact distal pulses.  No murmur heard.  Pulmonary/Chest: Effort normal and breath sounds normal. No respiratory distress. She has no wheezes.  Abdominal: Soft. Bowel sounds are normal. She exhibits no distension. There is no tenderness.  Musculoskeletal: Normal range of motion. She exhibits no edema and no tenderness.  Lymphadenopathy:  She has no cervical adenopathy.  She has no  axillary adenopathy.  Neurological: She is alert and oriented to person, place, and time.  Skin: Skin is warm and dry. No rash noted. No erythema.  Psychiatric: Her behavior is normal. Judgment normal.  Breasts are examined bilaterally. There are no palpable masses in either breast. Both her stereotactic biopsy sites on the left breast are healing well with only minor scanning. Areola and nipples are normal    Data Reviewed  We have reviewed the MRI, mammogram, biopsy results etc. Again, there are see suspicious areas on her MRI. Where the biopsies had shown ductal carcinoma in situ with necrosis.She is ER and PR positive.  Assessment   Ductal carcinoma in situ of the left breast the second lesion and biopsy pending.   Plan   Definitive plans for surgery will be made when the second biopsy result was noted. If it is benign, then we will proceed with Burke needle localized lumpectomy and possible sentinel lymph node biopsy. If the lesion is positive, I want to see whether we can proceed with Burke lumpectomy including but the specimens versus Burke mastectomy. I color back when we know the results of the second biopsy.   Deanna Burke

## 2011-05-31 ENCOUNTER — Encounter (HOSPITAL_COMMUNITY)
Admission: RE | Disposition: A | Discharge status: Home / Self Care | Payer: Self-pay | Source: Ambulatory Visit | Attending: Surgery

## 2011-05-31 ENCOUNTER — Encounter (HOSPITAL_COMMUNITY): Payer: Self-pay | Admitting: Vascular Surgery

## 2011-05-31 ENCOUNTER — Ambulatory Visit (HOSPITAL_COMMUNITY)
Admission: RE | Admit: 2011-05-31 | Discharge: 2011-06-01 | Disposition: A | Discharge status: Home / Self Care | Payer: PRIVATE HEALTH INSURANCE | Source: Ambulatory Visit | Attending: Surgery | Admitting: Surgery

## 2011-05-31 ENCOUNTER — Other Ambulatory Visit (INDEPENDENT_AMBULATORY_CARE_PROVIDER_SITE_OTHER): Payer: Self-pay | Admitting: Surgery

## 2011-05-31 ENCOUNTER — Ambulatory Visit (HOSPITAL_COMMUNITY): Payer: PRIVATE HEALTH INSURANCE | Admitting: Vascular Surgery

## 2011-05-31 ENCOUNTER — Encounter (HOSPITAL_COMMUNITY): Payer: Self-pay

## 2011-05-31 ENCOUNTER — Ambulatory Visit
Admission: RE | Admit: 2011-05-31 | Discharge: 2011-05-31 | Disposition: A | Discharge status: Home / Self Care | Payer: PRIVATE HEALTH INSURANCE | Source: Ambulatory Visit | Attending: Surgery | Admitting: Surgery

## 2011-05-31 DIAGNOSIS — G4730 Sleep apnea, unspecified: Secondary | ICD-10-CM | POA: Insufficient documentation

## 2011-05-31 DIAGNOSIS — Z8711 Personal history of peptic ulcer disease: Secondary | ICD-10-CM | POA: Insufficient documentation

## 2011-05-31 DIAGNOSIS — D0510 Intraductal carcinoma in situ of unspecified breast: Secondary | ICD-10-CM

## 2011-05-31 DIAGNOSIS — D0590 Unspecified type of carcinoma in situ of unspecified breast: Secondary | ICD-10-CM | POA: Insufficient documentation

## 2011-05-31 DIAGNOSIS — C50919 Malignant neoplasm of unspecified site of unspecified female breast: Secondary | ICD-10-CM

## 2011-05-31 HISTORY — PX: BREAST LUMPECTOMY: SHX2

## 2011-05-31 SURGERY — Surgical Case
Anesthesia: *Unknown

## 2011-05-31 SURGERY — BREAST LUMPECTOMY WITH NEEDLE LOCALIZATION
Anesthesia: General | Site: Breast | Laterality: Left | Wound class: Clean

## 2011-05-31 MED ORDER — MORPHINE SULFATE 2 MG/ML IJ SOLN: 2.0000 mg | INTRAMUSCULAR | Status: DC | PRN

## 2011-05-31 MED ORDER — SODIUM CHLORIDE 0.9 % IV SOLN: INTRAVENOUS | Status: DC

## 2011-05-31 MED ORDER — PROPOFOL 10 MG/ML IV EMUL
INTRAVENOUS | Status: DC | PRN
  Administered 2011-05-31: 200 mg via INTRAVENOUS

## 2011-05-31 MED ORDER — ONDANSETRON HCL 4 MG/2ML IJ SOLN: 4.0000 mg | Freq: Once | INTRAMUSCULAR | Status: DC | PRN

## 2011-05-31 MED ORDER — TRIAMTERENE-HCTZ 37.5-25 MG PO CAPS
1.0000 | ORAL_CAPSULE | ORAL | Status: DC
  Administered 2011-06-01: 1 via ORAL
  Filled 2011-05-31 (×2): qty 1

## 2011-05-31 MED ORDER — LACTATED RINGERS IV SOLN
INTRAVENOUS | Status: DC
  Administered 2011-05-31: 12:00:00 via INTRAVENOUS

## 2011-05-31 MED ORDER — ONDANSETRON HCL 4 MG/2ML IJ SOLN
INTRAMUSCULAR | Status: DC | PRN
  Administered 2011-05-31: 4 mg via INTRAVENOUS

## 2011-05-31 MED ORDER — HYDROCODONE-ACETAMINOPHEN 5-325 MG PO TABS: 1.0000 | ORAL_TABLET | ORAL | Status: DC | PRN

## 2011-05-31 MED ORDER — FERROUS FUMARATE 325 (106 FE) MG PO TABS
1.0000 | ORAL_TABLET | Freq: Every day | ORAL | Status: DC
Start: 2011-06-01 — End: 2011-06-01
  Administered 2011-06-01: 106 mg via ORAL
  Filled 2011-05-31: qty 1

## 2011-05-31 MED ORDER — LACTATED RINGERS IV SOLN
INTRAVENOUS | Status: DC | PRN
  Administered 2011-05-31 (×2): via INTRAVENOUS

## 2011-05-31 MED ORDER — KETOROLAC TROMETHAMINE 15 MG/ML IJ SOLN
15.0000 mg | Freq: Four times a day (QID) | INTRAMUSCULAR | Status: DC | PRN
  Administered 2011-05-31: 15 mg via INTRAVENOUS
  Filled 2011-05-31: qty 1

## 2011-05-31 MED ORDER — ENOXAPARIN SODIUM 40 MG/0.4ML ~~LOC~~ SOLN
40.0000 mg | SUBCUTANEOUS | Status: DC
  Filled 2011-05-31: qty 0.4

## 2011-05-31 MED ORDER — FENTANYL CITRATE 0.05 MG/ML IJ SOLN
INTRAMUSCULAR | Status: DC | PRN
  Administered 2011-05-31: 25 ug via INTRAVENOUS
  Administered 2011-05-31: 50 ug via INTRAVENOUS
  Administered 2011-05-31: 100 ug via INTRAVENOUS

## 2011-05-31 MED ORDER — MEPERIDINE HCL 25 MG/ML IJ SOLN: 6.2500 mg | INTRAMUSCULAR | Status: DC | PRN

## 2011-05-31 MED ORDER — ONDANSETRON HCL 4 MG PO TABS: 4.0000 mg | ORAL_TABLET | Freq: Four times a day (QID) | ORAL | Status: DC | PRN

## 2011-05-31 MED ORDER — MIDAZOLAM HCL 5 MG/5ML IJ SOLN
INTRAMUSCULAR | Status: DC | PRN
  Administered 2011-05-31: 2 mg via INTRAVENOUS

## 2011-05-31 MED ORDER — 0.9 % SODIUM CHLORIDE (POUR BTL) OPTIME
TOPICAL | Status: DC | PRN
  Administered 2011-05-31: 1000 mL

## 2011-05-31 MED ORDER — ONDANSETRON HCL 4 MG/2ML IJ SOLN: 4.0000 mg | Freq: Four times a day (QID) | INTRAMUSCULAR | Status: DC | PRN

## 2011-05-31 MED ORDER — BUPIVACAINE-EPINEPHRINE PF 0.25-1:200000 % IJ SOLN
INTRAMUSCULAR | Status: DC | PRN
  Administered 2011-05-31: 20 mL

## 2011-05-31 MED ORDER — FERROUS FUMARATE 325 (106 FE) MG PO TABS
1.0000 | ORAL_TABLET | Freq: Every day | ORAL | Status: DC
  Filled 2011-05-31: qty 1

## 2011-05-31 SURGICAL SUPPLY — 46 items
APL SKNCLS STERI-STRIP NONHPOA (GAUZE/BANDAGES/DRESSINGS)
BENZOIN TINCTURE PRP APPL 2/3 (GAUZE/BANDAGES/DRESSINGS) ×1 IMPLANT
BLADE SURG 10 STRL SS (BLADE) ×1 IMPLANT
BLADE SURG 15 STRL LF DISP TIS (BLADE) IMPLANT
BLADE SURG 15 STRL SS (BLADE) ×2
CHLORAPREP W/TINT 26ML (MISCELLANEOUS) ×2 IMPLANT
CLOTH BEACON ORANGE TIMEOUT ST (SAFETY) ×2 IMPLANT
COVER SURGICAL LIGHT HANDLE (MISCELLANEOUS) ×2 IMPLANT
DEVICE DUBIN SPECIMEN MAMMOGRA (MISCELLANEOUS) ×2 IMPLANT
DRAPE CHEST BREAST 15X10 FENES (DRAPES) ×2 IMPLANT
DRSG TEGADERM 2-3/8X2-3/4 SM (GAUZE/BANDAGES/DRESSINGS) ×1 IMPLANT
DRSG TEGADERM 4X4.75 (GAUZE/BANDAGES/DRESSINGS) ×1 IMPLANT
ELECT CAUTERY BLADE 6.4 (BLADE) ×2 IMPLANT
ELECT REM PT RETURN 9FT ADLT (ELECTROSURGICAL) ×2
ELECTRODE REM PT RTRN 9FT ADLT (ELECTROSURGICAL) ×1 IMPLANT
GLOVE BIOGEL PI IND STRL 7.0 (GLOVE) IMPLANT
GLOVE BIOGEL PI IND STRL 7.5 (GLOVE) IMPLANT
GLOVE BIOGEL PI INDICATOR 7.0 (GLOVE) ×1
GLOVE BIOGEL PI INDICATOR 7.5 (GLOVE) ×1
GLOVE ECLIPSE 7.0 STRL STRAW (GLOVE) ×1 IMPLANT
GLOVE SS BIOGEL STRL SZ 6.5 (GLOVE) IMPLANT
GLOVE SUPERSENSE BIOGEL SZ 6.5 (GLOVE) ×1
GLOVE SURG SIGNA 7.5 PF LTX (GLOVE) ×2 IMPLANT
GOWN PREVENTION PLUS XLARGE (GOWN DISPOSABLE) ×2 IMPLANT
GOWN STRL NON-REIN LRG LVL3 (GOWN DISPOSABLE) ×2 IMPLANT
KIT BASIN OR (CUSTOM PROCEDURE TRAY) ×2 IMPLANT
KIT MARKER MARGIN INK (KITS) ×1 IMPLANT
KIT ROOM TURNOVER OR (KITS) ×2 IMPLANT
NS IRRIG 1000ML POUR BTL (IV SOLUTION) ×2 IMPLANT
PACK SURGICAL SETUP 50X90 (CUSTOM PROCEDURE TRAY) ×2 IMPLANT
PAD ARMBOARD 7.5X6 YLW CONV (MISCELLANEOUS) ×2 IMPLANT
PENCIL BUTTON HOLSTER BLD 10FT (ELECTRODE) ×2 IMPLANT
SPONGE GAUZE 4X4 12PLY (GAUZE/BANDAGES/DRESSINGS) ×1 IMPLANT
SPONGE LAP 4X18 X RAY DECT (DISPOSABLE) ×3 IMPLANT
STAPLER VISISTAT 35W (STAPLE) IMPLANT
STRIP CLOSURE SKIN 1/2X4 (GAUZE/BANDAGES/DRESSINGS) ×1 IMPLANT
SUT MON AB 4-0 PC3 18 (SUTURE) ×2 IMPLANT
SUT SILK 2 0 SH (SUTURE) IMPLANT
SUT VIC AB 3-0 SH 27 (SUTURE) ×2
SUT VIC AB 3-0 SH 27XBRD (SUTURE) ×1 IMPLANT
SYR BULB 3OZ (MISCELLANEOUS) ×1 IMPLANT
SYR CONTROL 10ML LL (SYRINGE) ×2 IMPLANT
TOWEL OR 17X24 6PK STRL BLUE (TOWEL DISPOSABLE) ×1 IMPLANT
TOWEL OR 17X26 10 PK STRL BLUE (TOWEL DISPOSABLE) ×2 IMPLANT
TUBE CONNECTING 12X1/4 (SUCTIONS) ×1 IMPLANT
YANKAUER SUCT BULB TIP NO VENT (SUCTIONS) ×1 IMPLANT

## 2011-05-31 NOTE — Addendum Note (Signed)
Addendum  created 05/31/11 1405 by Napoleon Form, MD   Modules edited:Orders, PRL Based Order Sets

## 2011-05-31 NOTE — Anesthesia Procedure Notes (Signed)
Procedure Name: LMA Insertion Date/Time: 05/31/2011 12:19 PM Performed by: Virgil Benedict Pre-anesthesia Checklist: Patient identified, Emergency Drugs available, Suction available, Patient being monitored and Timeout performed Patient Re-evaluated:Patient Re-evaluated prior to inductionPreoxygenation: Pre-oxygenation with 100% oxygen Intubation Type: IV induction Ventilation: Mask ventilation without difficulty LMA: LMA inserted LMA Size: 4.0 Number of attempts: 2 Placement Confirmation: positive ETCO2 and breath sounds checked- equal and bilateral Tube secured with: Tape Dental Injury: Teeth and Oropharynx as per pre-operative assessment

## 2011-05-31 NOTE — Transfer of Care (Signed)
Immediate Anesthesia Transfer of Care Note  Patient: Deanna Burke  Procedure(s) Performed:  BREAST LUMPECTOMY WITH NEEDLE LOCALIZATION - Left breast needle localized lumpectomy   Patient Location: PACU  Anesthesia Type: General  Level of Consciousness: sedated  Airway & Oxygen Therapy: Patient Spontanous Breathing and Patient connected to nasal cannula oxygen  Post-op Assessment: Report given to PACU RN and Post -op Vital signs reviewed and stable  Post vital signs: Reviewed  Complications: No apparent anesthesia complications

## 2011-05-31 NOTE — Anesthesia Postprocedure Evaluation (Signed)
  Anesthesia Post-op Note  Patient: Deanna Burke  Procedure(s) Performed:  BREAST LUMPECTOMY WITH NEEDLE LOCALIZATION - Left breast needle localized lumpectomy   Patient Location: PACU  Anesthesia Type: General  Level of Consciousness: awake, alert  and oriented  Airway and Oxygen Therapy: Patient Spontanous Breathing and Patient connected to nasal cannula oxygen  Post-op Pain: none  Post-op Assessment: Post-op Vital signs reviewed, Patient's Cardiovascular Status Stable, Respiratory Function Stable, Patent Airway, No signs of Nausea or vomiting and Pain level controlled  Post-op Vital Signs: Reviewed and stable  Complications: No apparent anesthesia complications

## 2011-05-31 NOTE — Interval H&P Note (Signed)
History and Physical Interval Note: she has had no change in her history or exam  05/31/2011 11:24 AM  Sharyne Richters  has presented today for surgery, with the diagnosis of left breast DCIS   The various methods of treatment have been discussed with the patient and family. After consideration of risks, benefits and other options for treatment, the patient has consented to  Procedure(s): LEFT BREAST LUMPECTOMY WITH NEEDLE LOCALIZATION as a surgical intervention .  The patients' history has been reviewed, patient examined, no change in status, stable for surgery.  I have reviewed the patients' chart and labs.  Questions were answered to the patient's satisfaction.     Brytni Dray A

## 2011-05-31 NOTE — Op Note (Signed)
05/31/2011  1:12 PM  PATIENT:  Deanna Burke  61 y.o. female  PRE-OPERATIVE DIAGNOSIS:  left breast ductal carcinoma in situ  POST-OPERATIVE DIAGNOSIS:  left breast ductal carcinoma in situ  PROCEDURE:  Procedure(s): LEFT BREAST LUMPECTOMY WITH NEEDLE LOCALIZATION  SURGEON:  Surgeon(s): Harl Bowie, MD  PHYSICIAN ASSISTANT:   ASSISTANTS: none   ANESTHESIA:   general  EBL:  Total I/O In: 1000 [I.V.:1000] Out: -   BLOOD ADMINISTERED:none  DRAINS: none   LOCAL MEDICATIONS USED:  MARCAINE 20CC  SPECIMEN:  Excision  DISPOSITION OF SPECIMEN:  PATHOLOGY  COUNTS:  YES  TOURNIQUET:  * No tourniquets in log *  DICTATION: Viviann Spare Dictation Indications: This is a 61 year old female with 2 separate areas of ductal carcinoma in situ in the same quadrant of the left breast. Decision was made to proceed with needle localized lumpectomy of the area.  Procedure: The patient presented to the breast center and had 2 separate wires placed to bracket the area of ductal carcinoma in situ of left breast. She was then taken to the operating room and identified as the correct patient. She was placed on the operating room table. Her left chest and breast were prepped and draped in the usual sterile fashion. Both needles were in the lateral breast. I made a longitudinal incision between the 2 wires with the scalpel. I did this down to the breast tissue with electrocautery. I then did a very large lumpectomy going only to the chest wall with cautery. I was able to incorporate both wires and a single lobectomy specimen. Once the specimen was completely removed I marked all margins were obtained. The specimen was x-rayed and the suspicious areas were confirmed to be in the lumpectomy specimen. I then irrigated with saline and achieved hemostasis with the cautery. Occlusive disease tissue with interrupted 3-0 Vicryl sutures and closed the skin with a running 4-0 Monocryl. Steri-Strips, gauze,  and Tegaderm were then applied. The patient tolerated the procedure well. All the counts were correct at the end of the procedure. The patient was then extubated in the operating room and taken in a stable condition to the recovery room. PLAN OF CARE: Admit for overnight observation  PATIENT DISPOSITION:  PACU - hemodynamically stable.   Delay start of Pharmacological VTE agent (>24hrs) due to surgical blood loss or risk of bleeding:  {YES/NO/NOT APPLICABLE:20182

## 2011-06-01 MED ORDER — HYDROCODONE-ACETAMINOPHEN 5-325 MG PO TABS: 1.0000 | ORAL_TABLET | Freq: Four times a day (QID) | ORAL | Status: AC | PRN

## 2011-06-01 NOTE — Progress Notes (Signed)
Discharge home. Home discharge instruction given, no question verbalized. Alert and oriented, not in any distress, dressing to left breast intact, no drainage.

## 2011-06-01 NOTE — Discharge Summary (Signed)
Physician Discharge Summary  Patient ID: Deanna Burke MRN: [id number] DOB/AGE: [birth year] 61 y.o.  Admit date: 05/31/2011 Discharge date: 06/01/2011  Admission Diagnoses:  DCIS left breast  Discharge Diagnoses: same Active Problems:  * No active hospital problems. *    Discharged Condition: good  Hospital Course: did well, discharged pod#1  Consults: None  Significant Diagnostic Studies:   Treatments: surgery: lumpectomy  Discharge Exam: Blood pressure 116/51, pulse 85, temperature 97.6 F (36.4 C), temperature source Oral, resp. rate 18, height 5' 4"  (1.626 m), weight 169 lb (76.658 kg), SpO2 99.00%. incision clean  Disposition:   Discharge Orders    Future Appointments: Provider: Department: Dept Phone: Center:   06/14/2011 10:30 AM [facility] Nurse [facility] [phone] None   06/14/2011 11:00 AM Acquanetta Belling, MD [facility] [phone] None   06/15/2011 3:30 PM Chauncey Cruel, MD [facility] [phone] None     Medication List  As of 06/01/2011  7:17 AM   TAKE these medications         Bismuth Subgallate 200 MG Chew   Chew by mouth.      calcium carbonate 600 MG Tabs   Commonly known as: OS-CAL   Take 600 mg by mouth 2 (two) times daily with a meal.      ferrous fumarate 325 (106 FE) MG Tabs   Commonly known as: HEMOCYTE - 106 mg FE   Take 1 tablet by mouth.      fish oil-omega-3 fatty acids 1000 MG capsule   Take 1 g by mouth daily.      HYDROcodone-acetaminophen 5-325 MG per tablet   Commonly known as: NORCO   Take 1-2 tablets by mouth every 6 (six) hours as needed for pain.      ibuprofen 800 MG tablet   Commonly known as: ADVIL,MOTRIN   Take 800 mg by mouth 2 (two) times daily.      multivitamin with minerals tablet   Take 1 tablet by mouth daily.      triamterene-hydrochlorothiazide 37.5-25 MG per capsule   Commonly known as: DYAZIDE   Take 1 capsule by mouth every morning.      vitamin C 500 MG tablet   Commonly known as: ASCORBIC ACID   Take 1,000 mg by mouth daily.      vitamin E 400 UNIT capsule   Take 400 Units by mouth daily.           Follow-up Information    Follow up with [facility] A, MD. Schedule an appointment as soon as possible for a visit on 06/12/2011. [phone])    Contact information:   [facility], [place] [facility] [place] [address] [place] [phone]          Signed: Harl Bowie 06/01/2011, 7:17 AM

## 2011-06-01 NOTE — Progress Notes (Signed)
Patient ID: Deanna Burke, female   DOB: Aug 31, 1950, 61 y.o.   MRN: [id number] POD#1 Doing well No complaints Dressing dry and intact Plan: Discharge home

## 2011-06-08 ENCOUNTER — Encounter (INDEPENDENT_AMBULATORY_CARE_PROVIDER_SITE_OTHER): Payer: Self-pay | Admitting: General Surgery

## 2011-06-12 ENCOUNTER — Encounter (INDEPENDENT_AMBULATORY_CARE_PROVIDER_SITE_OTHER): Payer: Self-pay | Admitting: Surgery

## 2011-06-12 ENCOUNTER — Ambulatory Visit (INDEPENDENT_AMBULATORY_CARE_PROVIDER_SITE_OTHER): Payer: PRIVATE HEALTH INSURANCE | Admitting: Surgery

## 2011-06-12 VITALS — BP 128/82 | HR 112 | Temp 98.4°F | Resp 18 | Ht 64.0 in | Wt 171.0 lb

## 2011-06-12 DIAGNOSIS — Z09 Encounter for follow-up examination after completed treatment for conditions other than malignant neoplasm: Secondary | ICD-10-CM

## 2011-06-12 NOTE — Telephone Encounter (Signed)
error 

## 2011-06-12 NOTE — Progress Notes (Signed)
Subjective:     Patient ID: Deanna Burke, female   DOB: 11-Apr-1951, 61 y.o.   MRN: [id number]  HPI She is here for her first postoperative visit status post left breast needle localized lumpectomy. She is doing well and has no complaints.  Review of Systems     Objective:   Physical Exam On exam, her incision is well healed without evidence of infection. The final pathology showed 2 areas which were both quite small contained ductal carcinoma in situ. 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 I will see her back in 6 months

## 2011-06-13 ENCOUNTER — Encounter: Payer: Self-pay | Admitting: *Deleted

## 2011-06-14 ENCOUNTER — Encounter: Payer: Self-pay | Admitting: Radiation Oncology

## 2011-06-14 ENCOUNTER — Ambulatory Visit
Admission: RE | Admit: 2011-06-14 | Discharge: 2011-06-14 | Disposition: A | Discharge status: Home / Self Care | Payer: PRIVATE HEALTH INSURANCE | Source: Ambulatory Visit | Attending: Radiation Oncology | Admitting: Radiation Oncology

## 2011-06-14 DIAGNOSIS — D0510 Intraductal carcinoma in situ of unspecified breast: Secondary | ICD-10-CM

## 2011-06-14 DIAGNOSIS — C50419 Malignant neoplasm of upper-outer quadrant of unspecified female breast: Secondary | ICD-10-CM

## 2011-06-14 HISTORY — DX: Ileostomy status: Z93.2

## 2011-06-14 NOTE — Progress Notes (Signed)
[facility] Follow up Note  Name: Deanna Burke   Date: 06/14/2011    MRN: [id number] DOB: [birth year]  CC:  [name], MD, MD  Harl Bowie, MD  DIAGNOSIS: DCIS, left breast  NARRATIVE: Patient returns for followup postoperatively. Since I saw her in consultation the second area of suspicion on her MRI was biopsied and positive for DCIS. Dr. Ninfa Linden was able to accomplish a lumpectomy and he was able to resect both sites of DCIS. I've reviewed her surgical pathology report. Her lumpectomy was performed on 05-31-11; revealing 2 foci of high-grade ductal carcinoma in situ. It is ER and PR positive. The margins are clear but the closest margin is 0.11 cm. I have discussed the case with Christianne Dolin of radiology and she agrees that it would be prudent to obtain a postoperative mammogram to see if she has any residual calcifications. The patient feels well. She would like to take a short vacation before she starts radiotherapy. She does have a seroma that is healing postoperatively.   ALLERGIES: Demerol; Dilaudid; Stadol; Amoxicillin; Keflex; and Penicillins   MEDICATIONS:  Current Outpatient Prescriptions  Medication Sig Dispense Refill  . Bismuth Subgallate 200 MG CHEW Chew by mouth.      . calcium carbonate (OS-CAL) 600 MG TABS Take 600 mg by mouth 2 (two) times daily with a meal.      . ferrous fumarate (HEMOCYTE - 106 MG FE) 325 (106 FE) MG TABS Take 1 tablet by mouth.      . fish oil-omega-3 fatty acids 1000 MG capsule Take 1 g by mouth daily.      . Multiple Vitamins-Minerals (MULTIVITAMIN WITH MINERALS) tablet Take 1 tablet by mouth daily.      [name] triamterene-hydrochlorothiazide (DYAZIDE) 37.5-25 MG per capsule Take 1 capsule by mouth every morning.      . vitamin C (ASCORBIC ACID) 500 MG tablet Take 1,000 mg by mouth daily.      . vitamin E 400 UNIT capsule Take 400 Units by mouth daily.         PHYSICAL EXAM:   weight is 170 lb  3.2 oz (77.202 kg). Her temperature is 98 F (36.7 C). Her blood pressure is 137/82 and her pulse is 93.  Her breast exam is notable for a healing seroma in the left breast. There are no concerning palpable lesions in either breast, nor any palpable axillary adenopathy   LABORATORY DATA:  Lab Results  Component Value Date   WBC 5.6 05/24/2011   HGB 12.2 05/24/2011   HCT 36.2 05/24/2011   MCV 87.4 05/24/2011   PLT 307 05/24/2011   Lab Results  Component Value Date   NA 141 05/24/2011   K 3.3* 05/24/2011   CL 97 05/24/2011   CO2 31 05/24/2011   Lab Results  Component Value Date   ALT 17 05/03/2011   AST 23 05/03/2011   ALKPHOS 83 05/03/2011   BILITOT 0.5 05/03/2011       Pathology as above   IMPRESSION/PLAN: I have contacted our research nurse about the NSABP B. 43 to see if the patient is still eligible for this. In the meantime, I will obtain a postoperative mammogram. I will allow at least 5-6 weeks for the patient to heal before her mammography. My goal to start radiotherapy around the end of March. Will deliver approximately 50Gy in 25 fractions followed by 10 Gray in 5 fractions as a boost.   It was a pleasure  seeing the patient and I have encouraged her to call me if she has any questions or concerns in the interim.  We discussed the risks, benefits, and side effects of radiotherapy. No guarantees of treatment were given. A consent form was signed and placed in the patient's medical record. The patient is enthusiastic about proceeding with treatment. I look forward to participating in the patient's care.

## 2011-06-14 NOTE — Progress Notes (Signed)
Please see the Nurse Progress Note in the MD Initial Consult Encounter for this patient. 

## 2011-06-14 NOTE — Progress Notes (Signed)
[place] visit today.

## 2011-06-14 NOTE — Progress Notes (Signed)
Encounter addended by: Deirdre Evener, RN on: 06/14/2011  6:54 PM<BR>     Documentation filed: Charges VN

## 2011-06-14 NOTE — Progress Notes (Signed)
Encounter addended by: Deirdre Evener, RN on: 06/14/2011  7:02 PM<BR>     Documentation filed: Inpatient Patient Education

## 2011-06-15 ENCOUNTER — Telehealth: Payer: Self-pay | Admitting: *Deleted

## 2011-06-15 ENCOUNTER — Ambulatory Visit (HOSPITAL_BASED_OUTPATIENT_CLINIC_OR_DEPARTMENT_OTHER): Payer: PRIVATE HEALTH INSURANCE | Admitting: Oncology

## 2011-06-15 VITALS — BP 135/70 | HR 91 | Temp 98.3°F | Ht 64.0 in | Wt 172.0 lb

## 2011-06-15 DIAGNOSIS — D0510 Intraductal carcinoma in situ of unspecified breast: Secondary | ICD-10-CM

## 2011-06-15 DIAGNOSIS — D0590 Unspecified type of carcinoma in situ of unspecified breast: Secondary | ICD-10-CM

## 2011-06-15 NOTE — Progress Notes (Signed)
Deanna Burke  DOB: 03-17-1951  MR#: [id number]  CSN#: [id number]    History of present illness:   Deanna Burke is a 61 year old [place] woman with a new diagnosis of breast cancer.  She had routine screening mammography 03/22/2011 at [facility]. This showed new microcalcifications in the left breast. She was recalled for additional views December 14, and these showed the calcifications to be pleomorphic and linear. Stereotactic biopsy was performed 04/19/2011, and showed [phone] intermediate to high-grade ductal carcinoma in situ, estrogen receptor 100% positive, progesterone receptor 50% positive.  With this information the patient was referred for bilateral breast MRI, performed 04/27/2011. This showed no suspicious findings on the right. On the left there was 11 mm lobulated mass 5 cm from the prior biopsy clip. This second mass was biopsied today, but results are not yet available.  Interval history: Deanna Burke returns today for followup of her breast cancer. Since the last visit here she had her definitive surgery. Luckily it confirmed a ductal carcinoma in situ, with no invasive component. Both sites were removed. Margins were negative.  ROS: She did very well with the surgery, with no unusual pain, dehiscence, bleeding, fever, or rash. She did have some swelling. This was followed by Dr. Rush Farmer. That has stabilized. She has also met with Dr. Laurene Footman and the plan is to start radiation after she heals sometime in April. Aside from all these issues a detailed review of systems today was entirely negative. [facility] on a [facility] basis.  Past medical history:      Past Medical History  Diagnosis Date  . Ulcerative colitis [phone]  . Pulmonary embolus 1971  . Phlebitis 1971  . Small bowel obstruction [id number]  . Small bowel perforation 1971/1996  . Large bowel perforation 1971/1996  . Scoliosis   . Torn rotator cuff  2005  . Sleep apnea 2009  . History of bone density study 2012  . Fatigue   . Cataract     Left Eye  . Hot flashes   . 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 Hypertension   . Cancer     DCIS L breast  . H/O ulcerative colitis   . Breast cancer   . Arthritis     hands, lumbar spine, hips, right ankle  . S/P ileostomy   the patient underwent a total colectomy with ileostomy placement 1973 for her ulcerative colitis. She has done very well since.  Past surgical history:      Past Surgical History  Procedure Date  . Colectomy 1973  . Ileostomy 1973  . Vaginoplasty 1975  . Exploratory laparotomy 1978/1990    with lysis of adhesions  . Eye surgery 05/08/2011    left cataract removal   . Appendectomy   . Rotator cuff repair 2006    Right  . Breast surgery 05/03/11    LEFT BREAST NEEDLE CORE BIOPSY- DCIS  . 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  ER:+, PR+,   . Abdominal hysterectomy 1996    partial  status post appendectomy, status post hysterectomy with bilateral salpingo-oophorectomy.  Family history:     Family History  Problem Relation Age of Onset  . Breast cancer Maternal Grandmother   . Stomach cancer Maternal Grandfather   . Cancer Paternal  Grandmother     stomach  . Cancer Other     Breast  the patient's father died at the age of 31 from congestive heart failure. The patient's mother is alive at age 47. She had one brothers no sisters. There is no history of breast or ovarian cancer in the immediate family.  Gynecologic history:menarche age 53, underwent hysterectomy and bilateral salpingo-oophorectomy in 1996. She took hormone replacement for 16 years, stopping only at the time of this diagnosis. She is GX P0.    Social history:   She is a retired Arts development officer Investment banker, [employer]). She is divorced. She lives by  herself, with no pets. Her good friend, Deanna Burke is present today.    ADVANCED DIRECTIVES: no  Health maintenance:       History  Substance Use Topics  . Smoking status: Never Smoker   . Smokeless tobacco: Not on file  . Alcohol Use: No      Colonoscopy:never  PAP:  Bone density: normal 2007  Cholesterol: about 200, with a high HDL  Review of systems:  She is occasionally a little fatigued, but it does not affect her daily activities. As she is having cataract surgery in the left eye this week. She has a history of urinary tract infections and kidney infections and occasionally has pain with urination. She has some back pain and joint pain with some arthritis, but these are not either more intense or more frequent than before. She does have hot flashes which she describes as moderate. A detailed review of systems was otherwise noncontributory.  Allergies:     Allergies  Allergen Reactions  . Demerol Nausea And Vomiting  . Dilaudid (Hydromorphone Hcl) Nausea And Vomiting  . Stadol (Butorphanol Tartrate) Nausea And Vomiting  . Amoxicillin Rash  . Keflex Rash  . Penicillins Rash    Medications:      Current Outpatient Prescriptions  Medication Sig Dispense Refill  . Bismuth Subgallate 200 MG CHEW Chew by mouth.      . calcium carbonate (OS-CAL) 600 MG TABS Take 600 mg by mouth 2 (two) times daily with a meal.      . ferrous fumarate (HEMOCYTE - 106 MG FE) 325 (106 FE) MG TABS Take 1 tablet by mouth.      . fish oil-omega-3 fatty acids 1000 MG capsule Take 1 g by mouth daily.      . Multiple Vitamins-Minerals (MULTIVITAMIN WITH MINERALS) tablet Take 1 tablet by mouth daily.      [name] triamterene-hydrochlorothiazide (DYAZIDE) 37.5-25 MG per capsule Take 1 capsule by mouth every morning.      . vitamin C (ASCORBIC ACID) 500 MG tablet Take 1,000 mg by mouth daily.      . vitamin E 400 UNIT capsule Take 400 Units by mouth daily.        Physical exam:  Middle-aged white woman who  appears comfortable    Filed Vitals:   06/15/11 1548  BP: 135/70  Burke: 91  Temp: 98.3 F (36.8 C)     Body mass index is 29.52 kg/(m^2).  ECOG PS: 1  Sclerae unicteric Oropharynx clear No peripheral adenopathy Lungs no rales or rhonchi Heart regular rate and rhythm Abd bowel sounds positive. No tenderness MSK no focal spinal tenderness, no peripheral edema Neuro: nonfocal Breasts: right breast no suspicious findings. Left breast, status post lumpectomy. The incision is healed very nicely. There is no erythema or dehiscence. There is a small amount of fluid palpable beneath the scar. There  is no leakage.   Lab results:      CBC No results found for this basename: WBC:5,HGB:5,HCT:5,PLT:5,MCV:5,MCH:5,MCHC:5,RDW:5,NEUTRABS:5,LYMPHSABS:5,MONOABS:5,EOSABS:5,BASOSABS:5,BANDABS:5,BANDSABD:5 in the last 168 hours  Chemistries  No results found for this basename: NA:5,K:5,CL:5,CO2:5,GLUCOSE:5,BUN:5,CREATININE:5,CALCIUM:5,MG:5 in the last 168 hours Studies:   Mr Breast Bilateral W Wo Contrast  04/27/2011  *RADIOLOGY REPORT*  Clinical Data: new diagnosis of DCIS left breast upper outer quadrant  BUN and creatinine were obtained on site at [facility] at [address] Results:  BUN 25 mg/dL,  Creatinine 1.1 mg/dL.  BILATERAL BREAST MRI WITH AND WITHOUT CONTRAST  Technique: Multiplanar, multisequence MR images of both breasts were obtained prior to and following the intravenous administration of ml of .  Three dimensional images were evaluated at the independent DynaCad workstation.  Comparison:  mammograms dated 03/22/11 and subsequent diagnostic and biopsy-related images  Findings: There is mild background parenchymal enhancement bilaterally.  There no suspicious findings on the right.  There is a normal subcentimeter intramammary lymph node on the right in the 11:oo position at the middle third depth.  On the left, there is a marker clip in the 2:00 position at the middle third  depth, and this is associated with an anticipated core needle biopsy tract leading to the skin.  5cm posterior, medial, and inferior to the clip is an 11 x 17m lobulated mass showing moderate initial enhancement and plateau type kinetics.  There is a normal intramammary lymph node about 1.5cm inferolateral to this.  IMPRESSION: Focal enhancing mass in the left breast.  BI-RADS CATEGORY 4:  Suspicious abnormality - biopsy should be considered.  RECOMMENDATION:  MRI-guided biopsy of the left breast mass.  THREE-DIMENSIONAL MR IMAGE RENDERING ON INDEPENDENT WORKSTATION:  Three-dimensional MR images were rendered by post-processing of the original MR data on an independent workstation.  The three- dimensional MR images were interpreted, and findings were reported in the accompanying complete MRI report for this study.  Original Report Authenticated By: [email or web address]  Assessment: 61year old [place] status post left lumpectomy 05/31/2011, for ductal carcinoma in situ, involving 2 sites, both estrogen and progesterone receptor positive, high-grade, with negative margins.  Plan: We spent the better part of today's visit discussing anti-estrogen therapy and she understands that this will reduce slightly the risk of this cancer coming back in the ipsilateral breast. She will get most of the benefit in that regard from radiation. On the other hand anti-estrogens would cut in half her risk of developing a new breast cancer in either breast. That risk is estimated at about 1% per year. We then discussed the possible side effects toxicities and complications of anti-estrogens and she is particularly interested in tamoxifen, because she is status post hysterectomy, and because of concerns regarding the arthralgia/myalgia symptoms with aromatase inhibitors.  Accordingly she will see Dr. SLanell Personssometime in April, likely have radiation into June. She will start tamoxifen as she finishes radiation and she will see me  again in August. If she is tolerating tamoxifen well we will start following her on a once a year basis.  We'll so discussed the B-43 study today. One of our study nurses will be contacting her regarding enrollment.

## 2011-06-15 NOTE — Telephone Encounter (Signed)
xxx

## 2011-06-16 ENCOUNTER — Encounter: Payer: Self-pay | Admitting: *Deleted

## 2011-07-07 ENCOUNTER — Encounter: Payer: PRIVATE HEALTH INSURANCE | Admitting: *Deleted

## 2011-07-07 ENCOUNTER — Ambulatory Visit
Admission: RE | Admit: 2011-07-07 | Discharge: 2011-07-07 | Disposition: A | Discharge status: Home / Self Care | Payer: PRIVATE HEALTH INSURANCE | Source: Ambulatory Visit | Attending: Radiation Oncology | Admitting: Radiation Oncology

## 2011-07-07 DIAGNOSIS — D0510 Intraductal carcinoma in situ of unspecified breast: Secondary | ICD-10-CM

## 2011-07-07 DIAGNOSIS — C50419 Malignant neoplasm of upper-outer quadrant of unspecified female breast: Secondary | ICD-10-CM

## 2011-07-13 ENCOUNTER — Encounter: Payer: Self-pay | Admitting: *Deleted

## 2011-07-13 ENCOUNTER — Other Ambulatory Visit: Payer: Self-pay | Admitting: Radiation Oncology

## 2011-07-13 NOTE — Progress Notes (Signed)
Seen by Dr. Jana Hakim on 06/15/11/  Will start Tamoxifen after completion of Radition Therapy and will be contacted by Research for possible enrollment into the B-43 study

## 2011-07-14 ENCOUNTER — Encounter: Payer: Self-pay | Admitting: *Deleted

## 2011-07-14 ENCOUNTER — Encounter: Payer: Self-pay | Admitting: Radiation Oncology

## 2011-07-14 ENCOUNTER — Ambulatory Visit
Admission: RE | Admit: 2011-07-14 | Discharge: 2011-07-14 | Disposition: A | Discharge status: Home / Self Care | Payer: PRIVATE HEALTH INSURANCE | Source: Ambulatory Visit | Attending: Radiation Oncology | Admitting: Radiation Oncology

## 2011-07-14 VITALS — BP 149/83 | HR 79 | Temp 97.4°F | Wt 172.3 lb

## 2011-07-14 DIAGNOSIS — C50419 Malignant neoplasm of upper-outer quadrant of unspecified female breast: Secondary | ICD-10-CM

## 2011-07-14 NOTE — Progress Notes (Signed)
Here for consideration/planning for radiation therapy for diagnosis of left breast/dcis. Mammogram performed on 07/07/2011 reveals some scattered indeterminate calcifications.  Scheduled for ct scan on 07/20/2011 for h/o of recurrent uti's.Currently on cipro 5 day regime for uti now.

## 2011-07-14 NOTE — Progress Notes (Signed)
FUNC Left Breast Lumpectomy,neg margins,high grade ductal ca in situ with assoc. Calcifications,ER?PR+

## 2011-07-14 NOTE — Progress Notes (Signed)
Encounter addended by: Deirdre Evener, RN on: 07/14/2011  5:15 PM<BR>     Documentation filed: Charges VN

## 2011-07-14 NOTE — Progress Notes (Signed)
Met with patient to discuss RO billing.  Dx: 174.4 Upper-outer quadrant of breast  Attending Rad: Dr. [name] Tx: [place] Ross for outpt rad tx: Reb Nbr [phone]  [phone] - [place] [phone] - Healthcare Mgmt.

## 2011-07-14 NOTE — Progress Notes (Signed)
Encounter addended by: Deirdre Evener, RN on: 07/14/2011  5:16 PM<BR>     Documentation filed: Charges VN

## 2011-07-14 NOTE — Progress Notes (Signed)
Simulation treatment planning note, DCIS of the left breast  The patient is status post lumpectomy. She will receive whole breast radiotherapy. The patient was laid in the supine position on the treatment table with her arms over her head. Her head was in the Accuform device.  I placed adhesive wiring over her lumpectomy scar and around the borders of her left breast tissue . High-resolution CT axial imaging was obtained of the patient's chest. An isocenter was placed in her anterior left lung. Skin markings were made and she tolerated the procedure well without any complications.  Treatment planning note: the patient will be treated with opposed tangential fields using MLCs for custom blocks. I plan to prescribe 50 gray in 25 fractions to her left breast followed by a boost using a wedged pair to treat the lumpectomy cavity to 10 gray in 5 fractions. MLCs will be used for the blocks for the wedged pair as well.

## 2011-07-14 NOTE — Progress Notes (Signed)
Please see the Nurse Progress Note in the MD Initial Consult Encounter for this patient. 

## 2011-07-14 NOTE — Progress Notes (Signed)
I spoke with Deanna Burke of radiology about the patient's postoperative mammogram. [name] kindly reviewed numerous mammograms from the patient's history of screening imaging; the calcifications appear stable, and one-year followup mammography is appropriate. No biopsies were recommended.  I let patient know and  we will proceed with treatment planning.

## 2011-07-14 NOTE — Progress Notes (Signed)
[facility] Follow up Note  Name: Deanna Burke   Date: 07/14/2011    MRN: [id number] DOB: 02-28-1951  CC:  Deanna Spanish, MD, MD  Harl Bowie, MD  DIAGNOSIS: ER/PR positive DCIS of the left breast, multifocal    NARRATIVE: Is Deanna Burke returns for followup today. She has been bothered by recurrent UTIs and is undergoing a CT scan in the near future to further work this up. Right now she is taking ciprofloxacin. She had a postoperative mammogram on 07/07/2011. There were residual calcifications around the lumpectomy that were indeterminate. I called [name] of radiology (Dr. Purcell Nails is not attainable by Pager today) and asked her to review this further just to make sure that the patient is cleared for radiotherapy. Patient also had her tissue sent for HER-2/neu testing to see if she is eligible for NSABP B 43. No other complaints today other than her UTI.    ALLERGIES: Demerol; Dilaudid; Stadol; Amoxicillin; Keflex; and Penicillins   MEDICATIONS:  Current Outpatient Prescriptions  Medication Sig Dispense Refill  . aspirin 325 MG tablet Take 325 mg by mouth daily.      . Bismuth Subgallate 200 MG CHEW Chew by mouth.      . calcium carbonate (OS-CAL) 600 MG TABS Take 600 mg by mouth 2 (two) times daily with a meal.      . ciprofloxacin (CIPRO) 500 MG tablet Take 500 mg by mouth 2 (two) times daily. uti      . ferrous fumarate (HEMOCYTE - 106 MG FE) 325 (106 FE) MG TABS Take 1 tablet by mouth.      . fish oil-omega-3 fatty acids 1000 MG capsule Take 1 g by mouth daily.      . Multiple Vitamins-Minerals (MULTIVITAMIN WITH MINERALS) tablet Take 1 tablet by mouth daily.      [name] triamterene-hydrochlorothiazide (DYAZIDE) 37.5-25 MG per capsule Take 1 capsule by mouth every morning.      . vitamin C (ASCORBIC ACID) 500 MG tablet Take 1,000 mg by mouth daily.      . vitamin E 400 UNIT capsule Take 400 Units by mouth daily.         PHYSICAL EXAM:     weight is 172 lb 4.8 oz (78.155 kg). Her temperature is 97.4 F (36.3 C). Her blood pressure is 149/83 and her pulse is 79.  She is in no acute distress sitting in a chair   LABORATORY DATA:  Lab Results  Component Value Date   WBC 5.6 05/24/2011   HGB 12.2 05/24/2011   HCT 36.2 05/24/2011   MCV 87.4 05/24/2011   PLT 307 05/24/2011   CMP     Component Value Date/Time   NA 141 05/24/2011 1203   K 3.3* 05/24/2011 1203   CL 97 05/24/2011 1203   CO2 31 05/24/2011 1203   GLUCOSE 94 05/24/2011 1203   BUN 31* 05/24/2011 1203   CREATININE 1.18* 05/24/2011 1203   CALCIUM 11.4* 05/24/2011 1203   PROT 7.7 05/03/2011 1240   ALBUMIN 3.7 05/03/2011 1240   AST 23 05/03/2011 1240   ALT 17 05/03/2011 1240   ALKPHOS 83 05/03/2011 1240   BILITOT 0.5 05/03/2011 1240   GFRNONAA 48* 05/24/2011 1203   GFRAA 56* 05/24/2011 1203       RADIOGRAPHIC STUDIES:   Mm Digital Diagnostic Unilat L  07/07/2011  *RADIOLOGY REPORT*  Clinical Data:  Malignant lumpectomy of the left breast in February, 2013.  Pathology revealed high-grade DCIS.  Pre radiation therapy evaluation to determine if there is residual calcification.  DIGITAL DIAGNOSTIC LEFT MAMMOGRAM WITH CAD  Comparison:  05/31/2011, 05/03/2011, dating back to 04/04/2006.  Findings:  CC and MLO views of the left breast and a spot tangential view at the lumpectomy site was obtained. There are scattered residual calcifications at the inferior and anterior margin of the lumpectomy site which are indeterminate; these are difficult to visualize unless one uses the inverted magnifying glass function.  There are more benign appearing calcifications in the anterior left breast.  Expected post lumpectomy density is present. Mammographic images were processed with CAD.  IMPRESSION: Scattered indeterminate calcifications at the inferior and anterior margins of the lumpectomy site.  Benign appearing calcifications in the far anterior breast.  Recommendation:  The patient will be due for  annual bilateral diagnostic mammography 1 year after the lumpectomy in February, 2014.  BI-RADS CATEGORY 6:  Known biopsy-proven malignancy - appropriate action should be taken.  Original Report Authenticated By: Deniece Portela, M.D.     IMPRESSION/PLAN: I will proceed with her scheduled simulation today. I am waiting to hear back from radiology regarding whether any further biopsies are recommended in light of the residual calcifications. I explained this to the patient, telling her that I want to be thorough before we proceed with 6 weeks of radiotherapy. She is agreeable to this plan.  I will be in touch with the research nurses regarding her eligibility for  NSABP B. 43

## 2011-07-21 ENCOUNTER — Encounter: Payer: Self-pay | Admitting: Radiation Oncology

## 2011-07-21 ENCOUNTER — Ambulatory Visit
Admission: RE | Admit: 2011-07-21 | Discharge: 2011-07-21 | Disposition: A | Discharge status: Home / Self Care | Payer: PRIVATE HEALTH INSURANCE | Source: Ambulatory Visit | Attending: Radiation Oncology | Admitting: Radiation Oncology

## 2011-07-21 DIAGNOSIS — C50419 Malignant neoplasm of upper-outer quadrant of unspecified female breast: Secondary | ICD-10-CM

## 2011-07-21 MED ORDER — RADIAPLEXRX EX GEL
Freq: Once | CUTANEOUS | Status: AC
  Administered 2011-07-21: 1 via TOPICAL

## 2011-07-21 MED ORDER — ALRA NON-METALLIC DEODORANT (RAD-ONC)
1.0000 "application " | Freq: Once | TOPICAL | Status: AC
  Administered 2011-07-21: 1 via TOPICAL

## 2011-07-21 NOTE — Progress Notes (Signed)
Post [employer].  Given Radiation therapy and You Booklet.  Side Effect management Treatment documented in Patient Education section of EMR.

## 2011-07-21 NOTE — Progress Notes (Signed)
NARRATIVE: The patient was laid in the correct position on the treatment table for simulation verification. Portal imaging was obtained of her left breast  and I verified the fields and MLCs to be accurate. The patient tolerated the procedure well.

## 2011-07-24 ENCOUNTER — Ambulatory Visit
Admission: RE | Admit: 2011-07-24 | Discharge: 2011-07-24 | Disposition: A | Discharge status: Home / Self Care | Payer: PRIVATE HEALTH INSURANCE | Source: Ambulatory Visit | Attending: Radiation Oncology | Admitting: Radiation Oncology

## 2011-07-24 ENCOUNTER — Encounter: Payer: Self-pay | Admitting: Radiation Oncology

## 2011-07-24 VITALS — BP 128/83 | HR 79 | Temp 98.0°F | Wt 169.1 lb

## 2011-07-24 DIAGNOSIS — C50419 Malignant neoplasm of upper-outer quadrant of unspecified female breast: Secondary | ICD-10-CM

## 2011-07-24 NOTE — Progress Notes (Signed)
   Weekly Management Note Current Dose:   200 cGy  Projected Dose:  6000 cGy   Narrative:  The patient presents for routine under treatment assessment.  CBCT/MVCT images/Port film x-rays were reviewed.  The chart was checked. She is doing well with no complaints  Physical Findings: Weight: 169 lb 1.6 oz (76.703 kg). Skin exam deferred today  Impression:  The patient is tolerating radiotherapy.  Plan:  Continue radiotherapy as planned.

## 2011-07-24 NOTE — Progress Notes (Signed)
First treatment today.  No voiced concerns.  Post Sim- Education on Friday last week.

## 2011-07-25 ENCOUNTER — Ambulatory Visit
Admission: RE | Admit: 2011-07-25 | Discharge: 2011-07-25 | Disposition: A | Discharge status: Home / Self Care | Payer: PRIVATE HEALTH INSURANCE | Source: Ambulatory Visit | Attending: Radiation Oncology | Admitting: Radiation Oncology

## 2011-07-26 ENCOUNTER — Ambulatory Visit
Admission: RE | Admit: 2011-07-26 | Discharge: 2011-07-26 | Disposition: A | Discharge status: Home / Self Care | Payer: PRIVATE HEALTH INSURANCE | Source: Ambulatory Visit | Attending: Radiation Oncology | Admitting: Radiation Oncology

## 2011-07-27 ENCOUNTER — Ambulatory Visit
Admission: RE | Admit: 2011-07-27 | Discharge: 2011-07-27 | Disposition: A | Discharge status: Home / Self Care | Payer: PRIVATE HEALTH INSURANCE | Source: Ambulatory Visit | Attending: Radiation Oncology | Admitting: Radiation Oncology

## 2011-07-28 ENCOUNTER — Ambulatory Visit
Admission: RE | Admit: 2011-07-28 | Discharge: 2011-07-28 | Disposition: A | Discharge status: Home / Self Care | Payer: PRIVATE HEALTH INSURANCE | Source: Ambulatory Visit | Attending: Radiation Oncology | Admitting: Radiation Oncology

## 2011-07-31 ENCOUNTER — Encounter: Payer: Self-pay | Admitting: Radiation Oncology

## 2011-07-31 ENCOUNTER — Ambulatory Visit
Admission: RE | Admit: 2011-07-31 | Discharge: 2011-07-31 | Disposition: A | Discharge status: Home / Self Care | Payer: PRIVATE HEALTH INSURANCE | Source: Ambulatory Visit | Attending: Radiation Oncology | Admitting: Radiation Oncology

## 2011-07-31 VITALS — Wt 169.7 lb

## 2011-07-31 DIAGNOSIS — C50419 Malignant neoplasm of upper-outer quadrant of unspecified female breast: Secondary | ICD-10-CM

## 2011-07-31 NOTE — Progress Notes (Signed)
Alert and oriented x 3, skin intact, no changes noted left breast area,using radiaplex gel bid, stated,"feels she has another UTI", pressure, "urine is cloudy this am at 4am, go to Dr. Lovenia Shuck tomorrow 11:47 AM

## 2011-07-31 NOTE — Progress Notes (Signed)
   Weekly Management Note Current Dose:   1200 cGy  Projected Dose: 6000 cGy   Narrative:  The patient presents for routine under treatment assessment.  CBCT/MVCT images/Port film x-rays were reviewed.  The chart was checked. She is doing well, but has symptoms of recurrent UTI. Will see her PCP tomorrow.  Physical Findings: Weight: 169 lb 11.2 oz (76.975 kg).  NAD, no skin changes thus far.  Impression:  The patient is tolerating radiotherapy.  Plan:  Continue radiotherapy as planned.

## 2011-08-01 ENCOUNTER — Ambulatory Visit
Admission: RE | Admit: 2011-08-01 | Discharge: 2011-08-01 | Disposition: A | Discharge status: Home / Self Care | Payer: PRIVATE HEALTH INSURANCE | Source: Ambulatory Visit | Attending: Radiation Oncology | Admitting: Radiation Oncology

## 2011-08-01 DIAGNOSIS — C50419 Malignant neoplasm of upper-outer quadrant of unspecified female breast: Secondary | ICD-10-CM | POA: Insufficient documentation

## 2011-08-02 ENCOUNTER — Ambulatory Visit
Admission: RE | Admit: 2011-08-02 | Discharge: 2011-08-02 | Disposition: A | Discharge status: Home / Self Care | Payer: PRIVATE HEALTH INSURANCE | Source: Ambulatory Visit | Attending: Radiation Oncology | Admitting: Radiation Oncology

## 2011-08-03 ENCOUNTER — Emergency Department (INDEPENDENT_AMBULATORY_CARE_PROVIDER_SITE_OTHER): Payer: PRIVATE HEALTH INSURANCE

## 2011-08-03 ENCOUNTER — Ambulatory Visit: Payer: PRIVATE HEALTH INSURANCE

## 2011-08-03 ENCOUNTER — Inpatient Hospital Stay (HOSPITAL_BASED_OUTPATIENT_CLINIC_OR_DEPARTMENT_OTHER)
Admission: EM | Admit: 2011-08-03 | Discharge: 2011-08-13 | DRG: 389 | Disposition: A | Discharge status: Home / Self Care | Payer: PRIVATE HEALTH INSURANCE | Attending: Surgery | Admitting: Surgery

## 2011-08-03 ENCOUNTER — Encounter (HOSPITAL_BASED_OUTPATIENT_CLINIC_OR_DEPARTMENT_OTHER): Payer: Self-pay | Admitting: Family Medicine

## 2011-08-03 DIAGNOSIS — Z923 Personal history of irradiation: Secondary | ICD-10-CM

## 2011-08-03 DIAGNOSIS — N1330 Unspecified hydronephrosis: Secondary | ICD-10-CM

## 2011-08-03 DIAGNOSIS — R109 Unspecified abdominal pain: Secondary | ICD-10-CM

## 2011-08-03 DIAGNOSIS — E876 Hypokalemia: Secondary | ICD-10-CM | POA: Diagnosis present

## 2011-08-03 DIAGNOSIS — N390 Urinary tract infection, site not specified: Secondary | ICD-10-CM | POA: Diagnosis present

## 2011-08-03 DIAGNOSIS — K56609 Unspecified intestinal obstruction, unspecified as to partial versus complete obstruction: Secondary | ICD-10-CM

## 2011-08-03 DIAGNOSIS — Z86711 Personal history of pulmonary embolism: Secondary | ICD-10-CM

## 2011-08-03 DIAGNOSIS — R110 Nausea: Secondary | ICD-10-CM

## 2011-08-03 DIAGNOSIS — K565 Intestinal adhesions [bands], unspecified as to partial versus complete obstruction: Principal | ICD-10-CM | POA: Diagnosis present

## 2011-08-03 DIAGNOSIS — Z9049 Acquired absence of other specified parts of digestive tract: Secondary | ICD-10-CM

## 2011-08-03 DIAGNOSIS — Z932 Ileostomy status: Secondary | ICD-10-CM

## 2011-08-03 DIAGNOSIS — Z9010 Acquired absence of unspecified breast and nipple: Secondary | ICD-10-CM

## 2011-08-03 DIAGNOSIS — I10 Essential (primary) hypertension: Secondary | ICD-10-CM | POA: Diagnosis present

## 2011-08-03 DIAGNOSIS — N134 Hydroureter: Secondary | ICD-10-CM | POA: Diagnosis present

## 2011-08-03 DIAGNOSIS — E860 Dehydration: Secondary | ICD-10-CM | POA: Diagnosis present

## 2011-08-03 DIAGNOSIS — M542 Cervicalgia: Secondary | ICD-10-CM | POA: Diagnosis present

## 2011-08-03 DIAGNOSIS — R143 Flatulence: Secondary | ICD-10-CM

## 2011-08-03 DIAGNOSIS — N135 Crossing vessel and stricture of ureter without hydronephrosis: Secondary | ICD-10-CM | POA: Diagnosis present

## 2011-08-03 DIAGNOSIS — C50919 Malignant neoplasm of unspecified site of unspecified female breast: Secondary | ICD-10-CM | POA: Diagnosis present

## 2011-08-03 DIAGNOSIS — R6884 Jaw pain: Secondary | ICD-10-CM | POA: Diagnosis present

## 2011-08-03 LAB — LIPASE, BLOOD: Lipase: 44 U/L (ref 11–59)

## 2011-08-03 LAB — URINALYSIS, ROUTINE W REFLEX MICROSCOPIC
Bilirubin Urine: NEGATIVE
Hgb urine dipstick: NEGATIVE
Ketones, ur: NEGATIVE mg/dL
Protein, ur: NEGATIVE mg/dL
Urobilinogen, UA: 0.2 mg/dL (ref 0.0–1.0)

## 2011-08-03 LAB — BASIC METABOLIC PANEL
CO2: 26 mEq/L (ref 19–32)
Calcium: 10.3 mg/dL (ref 8.4–10.5)
GFR calc Af Amer: 42 mL/min — ABNORMAL LOW (ref >90)
GFR calc non Af Amer: 36 mL/min — ABNORMAL LOW (ref >90)
Sodium: 139 mEq/L (ref 135–145)

## 2011-08-03 LAB — CBC
MCH: 30.6 pg (ref 26.0–34.0)
MCHC: 35.3 g/dL (ref 30.0–36.0)
Platelets: 288 10*3/uL (ref 150–400)
RBC: 4.35 MIL/uL (ref 3.87–5.11)
RDW: 12.5 % (ref 11.5–15.5)

## 2011-08-03 LAB — HEPATIC FUNCTION PANEL
Indirect Bilirubin: 1 mg/dL — ABNORMAL HIGH (ref 0.3–0.9)
Total Protein: 8 g/dL (ref 6.0–8.3)

## 2011-08-03 MED ORDER — CIPROFLOXACIN IN D5W 400 MG/200ML IV SOLN
400.0000 mg | Freq: Two times a day (BID) | INTRAVENOUS | Status: DC
  Administered 2011-08-03 – 2011-08-11 (×17): 400 mg via INTRAVENOUS
  Filled 2011-08-03 (×18): qty 200

## 2011-08-03 MED ORDER — ONDANSETRON HCL 4 MG/2ML IJ SOLN
INTRAMUSCULAR | Status: AC
  Administered 2011-08-03: 4 mg via INTRAVENOUS
  Filled 2011-08-03: qty 2

## 2011-08-03 MED ORDER — IOHEXOL 300 MG/ML  SOLN
80.0000 mL | Freq: Once | INTRAMUSCULAR | Status: AC | PRN
  Administered 2011-08-03: 80 mL via INTRAVENOUS

## 2011-08-03 MED ORDER — ACETAMINOPHEN 650 MG RE SUPP: 650.0000 mg | Freq: Four times a day (QID) | RECTAL | Status: DC | PRN

## 2011-08-03 MED ORDER — MORPHINE SULFATE 4 MG/ML IJ SOLN
4.0000 mg | Freq: Once | INTRAMUSCULAR | Status: AC
  Administered 2011-08-03: 4 mg via INTRAVENOUS

## 2011-08-03 MED ORDER — ONDANSETRON HCL 4 MG/2ML IJ SOLN
4.0000 mg | Freq: Once | INTRAMUSCULAR | Status: AC
  Administered 2011-08-03: 4 mg via INTRAVENOUS
  Filled 2011-08-03: qty 2

## 2011-08-03 MED ORDER — MORPHINE SULFATE 4 MG/ML IJ SOLN
4.0000 mg | Freq: Once | INTRAMUSCULAR | Status: AC
  Administered 2011-08-03: 4 mg via INTRAVENOUS
  Filled 2011-08-03: qty 1

## 2011-08-03 MED ORDER — ONDANSETRON HCL 4 MG/2ML IJ SOLN
4.0000 mg | Freq: Once | INTRAMUSCULAR | Status: AC
  Administered 2011-08-03: 4 mg via INTRAVENOUS

## 2011-08-03 MED ORDER — MORPHINE SULFATE 2 MG/ML IJ SOLN
1.0000 mg | INTRAMUSCULAR | Status: DC | PRN
  Administered 2011-08-03 – 2011-08-09 (×19): 2 mg via INTRAVENOUS
  Filled 2011-08-03 (×19): qty 1

## 2011-08-03 MED ORDER — IOHEXOL 300 MG/ML  SOLN
20.0000 mL | INTRAMUSCULAR | Status: AC
  Administered 2011-08-03 (×2): 20 mL via ORAL

## 2011-08-03 MED ORDER — SODIUM CHLORIDE 0.9 % IV BOLUS (SEPSIS)
1000.0000 mL | Freq: Once | INTRAVENOUS | Status: AC
  Administered 2011-08-03: 1000 mL via INTRAVENOUS

## 2011-08-03 MED ORDER — KCL IN DEXTROSE-NACL 40-5-0.45 MEQ/L-%-% IV SOLN
INTRAVENOUS | Status: AC
  Administered 2011-08-04: 125 mL via INTRAVENOUS
  Administered 2011-08-04 – 2011-08-09 (×9): via INTRAVENOUS
  Filled 2011-08-03 (×22): qty 1000

## 2011-08-03 MED ORDER — MORPHINE SULFATE 4 MG/ML IJ SOLN
INTRAMUSCULAR | Status: AC
  Administered 2011-08-03: 4 mg via INTRAVENOUS
  Filled 2011-08-03: qty 1

## 2011-08-03 MED ORDER — ONDANSETRON HCL 4 MG/2ML IJ SOLN
4.0000 mg | Freq: Four times a day (QID) | INTRAMUSCULAR | Status: DC | PRN
  Administered 2011-08-03 – 2011-08-08 (×3): 4 mg via INTRAVENOUS
  Filled 2011-08-03 (×3): qty 2

## 2011-08-03 MED ORDER — ACETAMINOPHEN 325 MG PO TABS: 650.0000 mg | ORAL_TABLET | Freq: Four times a day (QID) | ORAL | Status: DC | PRN

## 2011-08-03 MED ORDER — PANTOPRAZOLE SODIUM 40 MG IV SOLR
40.0000 mg | Freq: Every day | INTRAVENOUS | Status: DC
  Administered 2011-08-03 – 2011-08-12 (×10): 40 mg via INTRAVENOUS
  Filled 2011-08-03 (×11): qty 40

## 2011-08-03 NOTE — ED Provider Notes (Signed)
History     CSN: [id number]  Arrival date & time 08/03/11  7096   First MD Initiated Contact with Patient 08/03/11 1002      Chief Complaint  Patient presents with  . Abdominal Pain    (Consider location/radiation/quality/duration/timing/severity/associated sxs/prior treatment) The history is provided by the patient.   the patient has had an ileostomy for approximately 40 years secondary to severe ulcerative colitis.  She presents today complaining of abdominal distention nausea without vomiting and no alcohol from her ileostomy bag x18 hours.  She reports no gas formation in her ileostomy either.  She has a history of bowel obstruction and ileus in the past.  She is currently being treated with antibiotics for recurrent urinary tract infection.  She is being evaluated by her primary care Dr. for right-sided hydronephrosis secondary to distal ureteral obstruction without ureteral stones.  She is scheduled to meet a  urologist for possible cystoscopy.  She reports her urine output continues to be normal.  She denies unilateral flank pain specifically no right flank pain.  She does however report some pain throughout her upper back.  She denies chest pain or shortness of breath.  She's had no fevers or chills.  She reports when she started antibiotics for the urinary tract infection is changed the quality of her stools and made them thicker and harder.  She's not tried stool softeners at home  Past Medical History  Diagnosis Date  . Ulcerative colitis [phone]  . Pulmonary embolus 1971  . Phlebitis 1971  . Small bowel obstruction [id number]  . Small bowel perforation 1971/1996  . Large bowel perforation 1971/1996  . Scoliosis   . Torn rotator cuff 2005  . Sleep apnea 2009  . History of bone density study 2012  . Fatigue   . Cataract     Left Eye  . Hot flashes   . 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 Hypertension   . Cancer       DCIS L breast  . H/O ulcerative colitis   . Breast cancer   . Arthritis     hands, lumbar spine, hips, right ankle  . S/P ileostomy     Past Surgical History  Procedure Date  . Colectomy 1973  . Ileostomy 1973  . Vaginoplasty 1975  . Exploratory laparotomy 1978/1990    with lysis of adhesions  . Eye surgery 05/08/2011    left cataract removal   . Appendectomy   . Rotator cuff repair 2006    Right  . Breast surgery 05/03/11    LEFT BREAST NEEDLE CORE BIOPSY- DCIS  . 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  ER:+, PR+,   . Abdominal hysterectomy 1996    partial    Family History  Problem Relation Age of Onset  . Breast cancer Maternal Grandmother   . Stomach cancer Maternal Grandfather   . Cancer Paternal Grandmother     stomach  . Cancer Other     Breast    History  Substance Use Topics  . Smoking status: Never Smoker   . Smokeless tobacco: Not on file  . Alcohol Use: No    OB History    Grav Para Term Preterm Abortions TAB SAB Ect Mult Living  Review of Systems  Gastrointestinal: Positive for abdominal pain.  All other systems reviewed and are negative.    Allergies  Demerol; Dilaudid; Stadol; Amoxicillin; Keflex; and Penicillins  Home Medications   Current Outpatient Rx  Name Route Sig Dispense Refill  . ASPIRIN 325 MG PO TABS Oral Take 325 mg by mouth daily.    [name] BISMUTH SUBGALLATE 200 MG PO CHEW Oral Chew by mouth.    [name] CALCIUM CARBONATE 600 MG PO TABS Oral Take 600 mg by mouth 2 (two) times daily with a meal.    . FERROUS FUMARATE 325 (106 FE) MG PO TABS Oral Take 1 tablet by mouth.    . OMEGA-3 FATTY ACIDS 1000 MG PO CAPS Oral Take 1 g by mouth daily.    . MULTI-VITAMIN/MINERALS PO TABS Oral Take 1 tablet by mouth daily.    . TRIAMTERENE-HCTZ 37.5-25 MG PO CAPS Oral Take 1 capsule by mouth every morning.    [name] VITAMIN C 500 MG  PO TABS Oral Take 1,000 mg by mouth daily.    [name] VITAMIN E 400 UNITS PO CAPS Oral Take 400 Units by mouth daily.      BP 152/84  Pulse 109  Temp(Src) 98.2 F (36.8 C) (Oral)  Resp 16  SpO2 97%  Physical Exam  Nursing note and vitals reviewed. Constitutional: She is oriented to person, place, and time. She appears well-developed and well-nourished. No distress.  HENT:  Head: Normocephalic and atraumatic.  Eyes: EOM are normal.  Neck: Normal range of motion.  Cardiovascular: Normal rate, regular rhythm and normal heart sounds.   Pulmonary/Chest: Effort normal and breath sounds normal.  Abdominal: Soft. She exhibits no distension. There is no rebound and no guarding.       Multiple healed surgical scars.  Ileostomy present in the right lower quadrant.  There is normal colored stool remnant in her ileostomy.  There is no gas nor solid stool formation in her ileostomy.  Left-sided abdominal tenderness  Musculoskeletal: Normal range of motion.  Neurological: She is alert and oriented to person, place, and time.  Skin: Skin is warm and dry.  Psychiatric: She has a normal mood and affect. Judgment normal.    ED Course  Procedures (including critical care time)  Labs Reviewed  BASIC METABOLIC PANEL - Abnormal; Notable for the following:    Potassium 3.2 (*)    Glucose, Bld 106 (*)    BUN 27 (*)    Creatinine, Ser 1.50 (*)    GFR calc non Af Amer 36 (*)    GFR calc Af Amer 42 (*)    All other components within normal limits  CBC   Ct Abdomen Pelvis W Contrast  08/03/2011  *RADIOLOGY REPORT*  Clinical Data: Abdominal pain, history of colectomy, ileostomy, appendectomy, hysterectomy, small bowel obstruction, history breast cancer  CT ABDOMEN AND PELVIS WITH CONTRAST  Technique:  Multidetector CT imaging of the abdomen and pelvis was performed following the standard protocol during bolus administration of intravenous contrast.  Contrast: 85m OMNIPAQUE IOHEXOL 300 MG/ML  SOLN   Comparison: None.  Findings:  Normal hepatic contour.  No discrete hepatic lesions.  Normal gallbladder.  No intrahepatic biliary duct dilatation.  There is moderate to severe right-sided hydronephrosis with associated delayed nephrogram.  The superior aspect of the right ureter is not visualized but appears anastomose to the adjacent loop of small bowel, possibly the sequela of provided history of urostomy.  There is a nonobstructing approximately 6 mm stone within the right  kidney.  The left kidney appears normal without evidence of urinary obstruction.  Normal noncontrast appearance of the bilateral adrenal glands, pancreas and spleen.  Enteric tube terminates within the distal esophagus.  There is extensive postsurgical change of the bowel with multiple enteric anastomoses.  Enteric contrast is seen to the mid small bowel. There is moderate dilatation of multiple loops of small bowel with index of the small bowel within the left mid hemiabdomen measuring approximately 4.5 cm in greatest coronal dimension (image 27). There is an apparent transition site within the left lower abdomen ( coronal image 24) with decompression of the distal loops of small bowel.  Small bowel feces are seen within the most distal dilated loops of small bowel.  Right lower quadrant and colostomy with multiple decompressed distal loops of bowel.  No pneumoperitoneum, pneumatosis or portal venous gas.  Scattered minimal atherosclerotic calcifications within a mildly tortuous but nonaneurysmal abdominal aorta.  The major branch vessels of the abdominal aorta, including the IMA, are patent. Shoddy retroperitoneal lymph nodes are not enlarged by CT criteria.  The urinary bladder is distended.  There is an approximately 1.8 x 1.8 cm hypoattenuating (18 HU) fluid collection (image 68, series 2) within the cranial aspect of the vaginal cuff.  No free fluid within the pelvis.  Limited visualization of the lower thorax demonstrates minimal  bibasilar linear atelectasis.  No focal airspace opacity.  No pleural effusion.  Normal heart size.  No pericardial effusion.  No acute or aggressive osseous abnormalities.  Mild to moderate multilevel lumbar spine degenerative change.  Bone island within the T12 vertebral body.  IMPRESSION: 1.  Extensive postsurgical change of the bowel with a high-grade small bowel obstruction with transition point within the left lower abdominal quadrant, presumably due to adhesions.  2.  Moderate right-sided hydronephrosis with delayed right-sided urogram.  The right-sided renal collecting system appears to drain into the adjacent loop of small bowel.  The chronicity of these findings is indeterminate in the absence of prior examinations.  3. Small amount of fluid within the cranial aspect of the vaginal cuff.  4. Enteric tube terminates within the distal esophagus. Advancement is recommended.  Original Report Authenticated By: Rachel Moulds, M.D.   Dg Abd 2 Views  08/03/2011  *RADIOLOGY REPORT*  Clinical Data: Nausea, abdominal distention, pain.  Ileostomy. History of small bowel obstruction.  ABDOMEN - 2 VIEW  Comparison: None.  Findings: Surgical clips in the right abdomen.  There are dilated small bowel loops with air-fluid levels concerning for small bowel obstruction.  No organomegaly.  Small calcification projects over the right renal lower pole, likely small nonobstructing stone.  Lung bases are clear.  Levoscoliosis in the lumbar spine with associated degenerative changes.  No acute bony abnormality.  IMPRESSION: Dilated small bowel with air-fluid levels concerning for small bowel obstruction.  Right nephrolithiasis suspected.  Original Report Authenticated By: Raelyn Number, M.D.   I personally reviewed the images   1. Small bowel obstruction       MDM  Plain films demonstrate bowel gas pattern consistent with small bowel obstruction.  An NG placed at this time resulted in minimal output.  Contrast  will be placed down her NG tube and a CT scan obtained to evaluate for possible transition point.  The patient is nontoxic-appearing and has no peritonitis on exam.  Her last session about obstruction she required adhesion takedown in the operating room 20 years ago.  The patient's pain is significantly improved after  4 mg IV morphine.   3:42 PM Ng tube advance. 100cc of contrast removed by ng. Back on LIWS now. Awaiting CT abd read   4:06 PM High grade SBO. NG in place. Still with left-sided abdominal tenderness.  No peritoneal signs. Awaiting return call from Surgery.  CT findings of right-sided hydronephrosis as expected and is being evaluated on an outpatient basis.  Hoy Morn, MD 08/03/11 [phone]

## 2011-08-03 NOTE — ED Provider Notes (Signed)
Patient was discussed with Dr. Margot Chimes. He accepted the patient for evaluation in the emergency department. His colleague Dr. Delana Meyer will be on call at the time of the patient's arrival. Report was called to Dr. Wilson Singer in emergency department. Patient will either be admitted to surgery or may require a general medicine admission for her small bowel obstruction.  Chauncy Passy, MD 08/03/11 [phone]

## 2011-08-03 NOTE — H&P (Signed)
Deanna Burke is an 61 y.o. female.    Chief Complaint: abdominal pain, distension, no output from ileostomy, recent UTI  HPI: patient is a 61 yo WF with less than 24 hour hx of abdominal distension, abdominal pain, nausea, and minimal output from end ileostomy.  Pt with hx of total proctocolectomy at [facility] in 1973 for ulcerative colitis.  Previous SBO with laparotomy 20 yrs ago.  Recent recurrent UTI's - appointment to see Urology consult on 4/22.  Last PM developed abd pain, distension, and has had no output from ileostomy since 7 PM last night.  Seen at [place] shows dilated SB loops with air fluid levels consistent with SBO.  CT abdomen confirms SBO with likely transition point in LLQ due to adhesions.  Also noted right hydroureter with communication of oral GI constrast into ureter - ? Fistula.  Patient transferred to [facility] ER for admission.  Patient currently undergoing treatment for left breast cancer - surgeon Dr. Ninfa Linden.  Presently undergoing XRT.  Past Medical History  Diagnosis Date  . Ulcerative colitis [phone]  . Pulmonary embolus 1971  . Phlebitis 1971  . Small bowel obstruction [id number]  . Small bowel perforation 1971/1996  . Large bowel perforation 1971/1996  . Scoliosis   . Torn rotator cuff 2005  . Sleep apnea 2009  . History of bone density study 2012  . Fatigue   . Cataract     Left Eye  . Hot flashes   . 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 Hypertension   . Cancer     DCIS L breast  . H/O ulcerative colitis   . Breast cancer   . Arthritis     hands, lumbar spine, hips, right ankle  . S/P ileostomy     Past Surgical History  Procedure Date  . Colectomy 1973  . Ileostomy 1973  . Vaginoplasty 1975  . Exploratory laparotomy 1978/1990    with lysis of adhesions  . Eye surgery 05/08/2011    left cataract removal   . Appendectomy   . Rotator cuff repair 2006    Right  . Breast  surgery 05/03/11    LEFT BREAST NEEDLE CORE BIOPSY- DCIS  . 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  ER:+, PR+,   . Abdominal hysterectomy 1996    partial    Family History  Problem Relation Age of Onset  . Breast cancer Maternal Grandmother   . Stomach cancer Maternal Grandfather   . Cancer Paternal Grandmother     stomach  . Cancer Other     Breast   Social History:  reports that she has never smoked. She does not have any smokeless tobacco history on file. She reports that she does not drink alcohol or use illicit drugs.  Allergies:  Allergies  Allergen Reactions  . Demerol Nausea And Vomiting  . Dilaudid (Hydromorphone Hcl) Nausea And Vomiting  . Stadol (Butorphanol Tartrate) Nausea And Vomiting  . Amoxicillin Rash  . Keflex Rash  . Penicillins Rash    Medications Prior to Admission  Medication Dose Route Frequency Provider Last Rate Last Dose  . iohexol (OMNIPAQUE) 300 MG/ML solution 20 mL  20 mL Oral Q1 Hr x 2 Medication Radiologist, MD      . iohexol (OMNIPAQUE) 300 MG/ML solution 80 mL  80 mL Intravenous  Once PRN Medication Radiologist, MD   80 mL at 08/03/11 1512  . morphine 4 MG/ML injection 4 mg  4 mg Intravenous Once Hoy Morn, MD   4 mg at 08/03/11 1215  . morphine 4 MG/ML injection 4 mg  4 mg Intravenous Once Hoy Morn, MD   4 mg at 08/03/11 1439  . ondansetron (ZOFRAN) injection 4 mg  4 mg Intravenous Once Hoy Morn, MD   4 mg at 08/03/11 1214  . ondansetron (ZOFRAN) injection 4 mg  4 mg Intravenous Once Hoy Morn, MD   4 mg at 08/03/11 1439  . sodium chloride 0.9 % bolus 1,000 mL  1,000 mL Intravenous Once Hoy Morn, MD   1,000 mL at 08/03/11 1046   Medications Prior to Admission  Medication Sig Dispense Refill  . aspirin 325 MG tablet Take 325 mg by mouth daily.      . Bismuth Subgallate 200 MG CHEW Chew by mouth.      . calcium  carbonate (OS-CAL) 600 MG TABS Take 600 mg by mouth 2 (two) times daily with a meal.      . fish oil-omega-3 fatty acids 1000 MG capsule Take 1 g by mouth daily.      . Multiple Vitamins-Minerals (MULTIVITAMIN WITH MINERALS) tablet Take 1 tablet by mouth daily.      [name] triamterene-hydrochlorothiazide (DYAZIDE) 37.5-25 MG per capsule Take 1 capsule by mouth every morning.      . vitamin C (ASCORBIC ACID) 500 MG tablet Take 1,000 mg by mouth daily.      . vitamin E 400 UNIT capsule Take 400 Units by mouth daily.      . ferrous fumarate (HEMOCYTE - 106 MG FE) 325 (106 FE) MG TABS Take 1 tablet by mouth.        Results for orders placed during the hospital encounter of 08/03/11 (from the past 48 hour(s))  CBC     Status: Normal   Collection Time   08/03/11 10:35 AM      Component Value Range Comment   WBC 5.6  4.0 - 10.5 (K/uL)    RBC 4.35  3.87 - 5.11 (MIL/uL)    Hemoglobin 13.3  12.0 - 15.0 (g/dL)    HCT 37.7  36.0 - 46.0 (%)    MCV 86.7  78.0 - 100.0 (fL)    MCH 30.6  26.0 - 34.0 (pg)    MCHC 35.3  30.0 - 36.0 (g/dL)    RDW 12.5  11.5 - 15.5 (%)    Platelets 288  150 - 400 (K/uL)   BASIC METABOLIC PANEL     Status: Abnormal   Collection Time   08/03/11 10:35 AM      Component Value Range Comment   Sodium 139  135 - 145 (mEq/L)    Potassium 3.2 (*) 3.5 - 5.1 (mEq/L)    Chloride 99  96 - 112 (mEq/L)    CO2 26  19 - 32 (mEq/L)    Glucose, Bld 106 (*) 70 - 99 (mg/dL)    BUN 27 (*) 6 - 23 (mg/dL)    Creatinine, Ser 1.50 (*) 0.50 - 1.10 (mg/dL)    Calcium 10.3  8.4 - 10.5 (mg/dL)    GFR calc non Af Amer 36 (*) >90 (mL/min)    GFR calc Af Amer 42 (*) >90 (mL/min)   LIPASE, BLOOD     Status: Normal   Collection Time   08/03/11 10:35 AM  Component Value Range Comment   Lipase 44  11 - 59 (U/L)   HEPATIC FUNCTION PANEL     Status: Abnormal   Collection Time   08/03/11 10:35 AM      Component Value Range Comment   Total Protein 8.0  6.0 - 8.3 (g/dL)    Albumin 4.3  3.5 - 5.2 (g/dL)     AST 28  0 - 37 (U/L)    ALT 25  0 - 35 (U/L)    Alkaline Phosphatase 99  39 - 117 (U/L)    Total Bilirubin 1.2  0.3 - 1.2 (mg/dL)    Bilirubin, Direct 0.2  0.0 - 0.3 (mg/dL)    Indirect Bilirubin 1.0 (*) 0.3 - 0.9 (mg/dL)    Ct Abdomen Pelvis W Contrast  08/03/2011  *RADIOLOGY REPORT*  Clinical Data: Abdominal pain, history of colectomy, ileostomy, appendectomy, hysterectomy, small bowel obstruction, history breast cancer  CT ABDOMEN AND PELVIS WITH CONTRAST  Technique:  Multidetector CT imaging of the abdomen and pelvis was performed following the standard protocol during bolus administration of intravenous contrast.  Contrast: 45m OMNIPAQUE IOHEXOL 300 MG/ML  SOLN  Comparison: None.  Findings:  Normal hepatic contour.  No discrete hepatic lesions.  Normal gallbladder.  No intrahepatic biliary duct dilatation.  There is moderate to severe right-sided hydronephrosis with associated delayed nephrogram.  The superior aspect of the right ureter is not visualized but appears anastomose to the adjacent loop of small bowel, possibly the sequela of provided history of urostomy.  There is a nonobstructing approximately 6 mm stone within the right kidney.  The left kidney appears normal without evidence of urinary obstruction.  Normal noncontrast appearance of the bilateral adrenal glands, pancreas and spleen.  Enteric tube terminates within the distal esophagus.  There is extensive postsurgical change of the bowel with multiple enteric anastomoses.  Enteric contrast is seen to the mid small bowel. There is moderate dilatation of multiple loops of small bowel with index of the small bowel within the left mid hemiabdomen measuring approximately 4.5 cm in greatest coronal dimension (image 27). There is an apparent transition site within the left lower abdomen ( coronal image 24) with decompression of the distal loops of small bowel.  Small bowel feces are seen within the most distal dilated loops of small bowel.   Right lower quadrant and colostomy with multiple decompressed distal loops of bowel.  No pneumoperitoneum, pneumatosis or portal venous gas.  Scattered minimal atherosclerotic calcifications within a mildly tortuous but nonaneurysmal abdominal aorta.  The major branch vessels of the abdominal aorta, including the IMA, are patent. Shoddy retroperitoneal lymph nodes are not enlarged by CT criteria.  The urinary bladder is distended.  There is an approximately 1.8 x 1.8 cm hypoattenuating (18 HU) fluid collection (image 68, series 2) within the cranial aspect of the vaginal cuff.  No free fluid within the pelvis.  Limited visualization of the lower thorax demonstrates minimal bibasilar linear atelectasis.  No focal airspace opacity.  No pleural effusion.  Normal heart size.  No pericardial effusion.  No acute or aggressive osseous abnormalities.  Mild to moderate multilevel lumbar spine degenerative change.  Bone island within the T12 vertebral body.  IMPRESSION: 1.  Extensive postsurgical change of the bowel with a high-grade small bowel obstruction with transition point within the left lower abdominal quadrant, presumably due to adhesions.  2.  Moderate right-sided hydronephrosis with delayed right-sided urogram.  The right-sided renal collecting system appears to drain into the adjacent loop  of small bowel.  The chronicity of these findings is indeterminate in the absence of prior examinations.  3. Small amount of fluid within the cranial aspect of the vaginal cuff.  4. Enteric tube terminates within the distal esophagus. Advancement is recommended.  Original Report Authenticated By: Rachel Moulds, M.D.   Dg Abd 2 Views  08/03/2011  *RADIOLOGY REPORT*  Clinical Data: Nausea, abdominal distention, pain.  Ileostomy. History of small bowel obstruction.  ABDOMEN - 2 VIEW  Comparison: None.  Findings: Surgical clips in the right abdomen.  There are dilated small bowel loops with air-fluid levels concerning for small  bowel obstruction.  No organomegaly.  Small calcification projects over the right renal lower pole, likely small nonobstructing stone.  Lung bases are clear.  Levoscoliosis in the lumbar spine with associated degenerative changes.  No acute bony abnormality.  IMPRESSION: Dilated small bowel with air-fluid levels concerning for small bowel obstruction.  Right nephrolithiasis suspected.  Original Report Authenticated By: Raelyn Number, M.D.    Review of Systems  Constitutional: Negative.  Negative for fever and chills.  HENT: Negative.   Eyes: Negative.   Respiratory: Negative.   Cardiovascular: Negative.   Gastrointestinal: Positive for nausea, abdominal pain and constipation. Negative for vomiting, diarrhea, blood in stool and melena.  Genitourinary: Positive for dysuria and frequency.  Musculoskeletal: Positive for back pain.  Skin: Negative.   Neurological: Negative.   Endo/Heme/Allergies: Negative.   Psychiatric/Behavioral: Negative.     Blood pressure 136/63, pulse 76, temperature 97.7 F (36.5 C), temperature source Oral, resp. rate 20, height 5' 4"  (1.626 m), weight 165 lb (74.844 kg), SpO2 99.00%. Physical Exam  Constitutional: She is oriented to person, place, and time. She appears well-developed and well-nourished.  HENT:  Head: Normocephalic and atraumatic.  Right Ear: External ear normal.  Left Ear: External ear normal.  Nose: Nose normal.  Mouth/Throat: Oropharynx is clear and moist.  Eyes: Conjunctivae and EOM are normal. Pupils are equal, round, and reactive to light.  Neck: Normal range of motion. Neck supple. No tracheal deviation present. No thyromegaly present.  Cardiovascular: Normal rate, regular rhythm, normal heart sounds and intact distal pulses.  Exam reveals no gallop and no friction rub.   No murmur heard. Respiratory: Effort normal and breath sounds normal. No respiratory distress. She has no wheezes. She has no rales.  GI: She exhibits distension. She  exhibits no mass. There is tenderness. There is guarding. There is no rebound.       Diffusely distended abdomen; moderate diffuse tenderness; ileostomy in RLQ with minimal succus in bag; well healed midline incision - no hernia  Musculoskeletal: Normal range of motion.  Lymphadenopathy:    She has no cervical adenopathy.  Neurological: She is alert and oriented to person, place, and time. She has normal reflexes.  Skin: Skin is warm and dry.  Psychiatric: She has a normal mood and affect. Her behavior is normal. Judgment and thought content normal.     Assessment/Plan 1.  Small bowel obstruction, likely secondary to adhesions.  Agree with NG tube, NPO, IV hydration. 2.  Recurrent UTI's - consider fistula to right ureter - ? Etiology - will start empiric IV abx.  Will need urology consultation. 3.  Dehydration - IVF 4.  Hypokalemia - IVF with KCL  Discussed with patient and friend at bedside.  She agrees with plans for admission and management.  Earnstine Regal, MD, [facility] Office: [place]  08/03/2011, 8:40 PM

## 2011-08-03 NOTE — ED Notes (Signed)
Pt. From [place] with diagnosed small bowel obstruction.  Transferred by [facility] for surgical consult.  Diffuse abdominal pain she rates as a 5.  Denies vomiting and diarrhea but reports occasional nausea.  Pt. Medicated for nausea and pain at [place].

## 2011-08-03 NOTE — ED Notes (Signed)
Pt sts she is concerned that she has blockage to ileostomy. No output since 7pm last evening. Pt c/o abdominal cramping and low back pain. 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 Pt is currently undergoing radiation treatment for breast cancer. Pt reports recent UTIs and sts he had [place] of kidneys yesterday but does not know results.

## 2011-08-03 NOTE — ED Notes (Signed)
KHT:[id number]<FS> Expected date:08/03/11<BR> Expected time: 5:55 PM<BR> Means of arrival:Other<BR> Comments:<BR> [facility] transfer from [facility]

## 2011-08-04 ENCOUNTER — Ambulatory Visit
Admission: RE | Admit: 2011-08-04 | Discharge: 2011-08-04 | Disposition: A | Discharge status: Home / Self Care | Payer: PRIVATE HEALTH INSURANCE | Source: Ambulatory Visit | Attending: Radiation Oncology | Admitting: Radiation Oncology

## 2011-08-04 ENCOUNTER — Encounter (HOSPITAL_COMMUNITY): Payer: Self-pay | Admitting: *Deleted

## 2011-08-04 ENCOUNTER — Inpatient Hospital Stay (HOSPITAL_COMMUNITY): Payer: PRIVATE HEALTH INSURANCE

## 2011-08-04 ENCOUNTER — Emergency Department (HOSPITAL_COMMUNITY): Payer: PRIVATE HEALTH INSURANCE

## 2011-08-04 ENCOUNTER — Telehealth: Payer: Self-pay | Admitting: *Deleted

## 2011-08-04 LAB — BASIC METABOLIC PANEL
Chloride: 99 mEq/L (ref 96–112)
Creatinine, Ser: 1.27 mg/dL — ABNORMAL HIGH (ref 0.50–1.10)
GFR calc Af Amer: 51 mL/min — ABNORMAL LOW (ref >90)
Potassium: 3 mEq/L — ABNORMAL LOW (ref 3.5–5.1)
Sodium: 135 mEq/L (ref 135–145)

## 2011-08-04 MED ORDER — POTASSIUM CHLORIDE 10 MEQ/100ML IV SOLN
10.0000 meq | Freq: Once | INTRAVENOUS | Status: AC
  Administered 2011-08-04: 10 meq via INTRAVENOUS
  Filled 2011-08-04: qty 100

## 2011-08-04 MED ORDER — POTASSIUM CHLORIDE 10 MEQ/100ML IV SOLN
10.0000 meq | INTRAVENOUS | Status: AC
  Administered 2011-08-04 (×2): 10 meq via INTRAVENOUS
  Filled 2011-08-04 (×3): qty 100

## 2011-08-04 NOTE — Consult Note (Signed)
Urology Consult   Physician requesting consult: CCS  Reason for consult: right sided hydronephrosis  History of Present Illness: Deanna Burke is a 61 y.o. with PMH significant for ulcerative colitis, PE, breast cancer, and HTN who presented to [facility] for eval of abd pain and no output from her ileostomy.  She had a colectomy approx 40 years ago and has a hx of SBOs.  She was admitted and CT scan revealed a SBO as well as right sided hydronephrosis with questionable fistula between the right ureter and small bowel.  She has an NGT in place and currently feels better.    Pt states she began to have UTIs in Sept 2012 which have persisted since then without resolution.  She was evaled and treated by her PCP Dr. Cherylann Ratel.  She states urine cultures were performed and she was treated with 7 days of Cipro each time.  After each course she would feel better and then approx 1 week later her sx would return.  She cannot remember the organism but states she was told it was the same with every culture.   She denies a history of voiding or storage urinary symptoms, hematuria, urolithiasis, GU malignancy/trauma/surgery.  She currently denies CP, SOB, N/V, abd pain, and fevers.  Past Medical History  Diagnosis Date  . Ulcerative colitis [phone]  . Pulmonary embolus 1971  . Phlebitis 1971  . Small bowel obstruction [id number]  . Small bowel perforation 1971/1996  . Large bowel perforation 1971/1996  . Scoliosis   . Torn rotator cuff 2005  . Sleep apnea 2009  . History of bone density study 2012  . Fatigue   . Cataract     Left Eye  . Hot flashes   . 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 Hypertension   . Cancer     DCIS L breast  . H/O ulcerative colitis   . Breast cancer   . Arthritis     hands, lumbar spine, hips, right ankle  . S/P ileostomy     Past Surgical History  Procedure Date  . Colectomy 1973  . Ileostomy 1973  .  Vaginoplasty 1975  . Exploratory laparotomy 1978/1990    with lysis of adhesions  . Eye surgery 05/08/2011    left cataract removal   . Appendectomy   . Rotator cuff repair 2006    Right  . Breast surgery 05/03/11    LEFT BREAST NEEDLE CORE BIOPSY- DCIS  . 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  ER:+, PR+,   . Abdominal hysterectomy 1996    partial     Current Hospital Medications: Scheduled Meds:   . ciprofloxacin  400 mg Intravenous Q12H  . iohexol  20 mL Oral Q1 Hr x 2  .  morphine injection  4 mg Intravenous Once  . ondansetron (ZOFRAN) IV  4 mg Intravenous Once  . pantoprazole (PROTONIX) IV  40 mg Intravenous QHS  . potassium chloride  10 mEq Intravenous Q1 Hr x 3   Continuous Infusions:   . dextrose 5 % and 0.45 % NaCl with KCl 40 mEq/L 125 mL (08/04/11 0145)   PRN Meds:.acetaminophen, acetaminophen, iohexol, morphine, ondansetron  Allergies:  Allergies  Allergen Reactions  . Demerol Nausea And Vomiting  . Dilaudid (Hydromorphone Hcl) Nausea And Vomiting  . Stadol (Butorphanol Tartrate) Nausea And Vomiting  .  Amoxicillin Rash  . Keflex Rash  . Penicillins Rash    Family History  Problem Relation Age of Onset  . Breast cancer Maternal Grandmother   . Stomach cancer Maternal Grandfather   . Cancer Paternal Grandmother     stomach  . Cancer Other     Breast    Social History:  reports that she has never smoked. She does not have any smokeless tobacco history on file. She reports that she does not drink alcohol or use illicit drugs.  ROS: A complete review of systems was performed.  All systems are negative except for pertinent findings as noted.  Physical Exam:  Vital signs in last 24 hours: Temp:  [97.7 F (36.5 C)-98.4 F (36.9 C)] 97.8 F (36.6 C) (04/12 1000) Pulse Rate:  [73-99] 99  (04/12 1000) Resp:  [18-20] 20  (04/12 1000) BP:  (125-148)/(60-79) 127/79 mmHg (04/12 1000) SpO2:  [97 %-100 %] 97 % (04/12 1000) Weight:  [74.844 kg (165 lb)] 74.844 kg (165 lb) (04/11 1819) General:  Alert and oriented, No acute distress HEENT: Normocephalic, atraumatic Neck: No JVD or lymphadenopathy Cardiovascular: Regular rate and rhythm Lungs: Clear bilaterally Abdomen: mildly tender, slightly distended; ileostomy viable Extremities: No edema Neurologic: Grossly intact  Laboratory Data:   Basename 08/03/11 1035  WBC 5.6  HGB 13.3  HCT 37.7  PLT 288     Basename 08/04/11 0432 08/03/11 1035  NA 135 139  K 3.0* 3.2*  CL 99 99  GLUCOSE 121* 106*  BUN 19 27*  CALCIUM 9.2 10.3  CREATININE 1.27* 1.50*     Results for orders placed during the hospital encounter of 08/03/11 (from the past 24 hour(s))  URINALYSIS, ROUTINE W REFLEX MICROSCOPIC     Status: Normal   Collection Time   08/03/11 10:17 PM      Component Value Range   Color, Urine YELLOW  YELLOW    APPearance CLEAR  CLEAR    Specific Gravity, Urine 1.027  1.005 - 1.030    pH 6.5  5.0 - 8.0    Glucose, UA NEGATIVE  NEGATIVE (mg/dL)   Hgb urine dipstick NEGATIVE  NEGATIVE    Bilirubin Urine NEGATIVE  NEGATIVE    Ketones, ur NEGATIVE  NEGATIVE (mg/dL)   Protein, ur NEGATIVE  NEGATIVE (mg/dL)   Urobilinogen, UA 0.2  0.0 - 1.0 (mg/dL)   Nitrite NEGATIVE  NEGATIVE    Leukocytes, UA NEGATIVE  NEGATIVE   BASIC METABOLIC PANEL     Status: Abnormal   Collection Time   08/04/11  4:32 AM      Component Value Range   Sodium 135  135 - 145 (mEq/L)   Potassium 3.0 (*) 3.5 - 5.1 (mEq/L)   Chloride 99  96 - 112 (mEq/L)   CO2 26  19 - 32 (mEq/L)   Glucose, Bld 121 (*) 70 - 99 (mg/dL)   BUN 19  6 - 23 (mg/dL)   Creatinine, Ser 1.27 (*) 0.50 - 1.10 (mg/dL)   Calcium 9.2  8.4 - 10.5 (mg/dL)   GFR calc non Af Amer 44 (*) >90 (mL/min)   GFR calc Af Amer 51 (*) >90 (mL/min)   No results found for this or any previous visit (from the past 240 hour(s)).  Renal  Function:  Basename 08/04/11 0432 08/03/11 1035  CREATININE 1.27* 1.50*   Estimated Creatinine Clearance: 45.5 ml/min (by C-G formula based on Cr of 1.27).  Radiologic Imaging: Ct Abdomen Pelvis W Contrast  08/03/2011  *RADIOLOGY REPORT*  Clinical Data: Abdominal pain, history of colectomy, ileostomy, appendectomy, hysterectomy, small bowel obstruction, history breast cancer  CT ABDOMEN AND PELVIS WITH CONTRAST  Technique:  Multidetector CT imaging of the abdomen and pelvis was performed following the standard protocol during bolus administration of intravenous contrast.  Contrast: 92m OMNIPAQUE IOHEXOL 300 MG/ML  SOLN  Comparison: None.  Findings:  Normal hepatic contour.  No discrete hepatic lesions.  Normal gallbladder.  No intrahepatic biliary duct dilatation.  There is moderate to severe right-sided hydronephrosis with associated delayed nephrogram.  The superior aspect of the right ureter is not visualized but appears anastomose to the adjacent loop of small bowel, possibly the sequela of provided history of urostomy.  There is a nonobstructing approximately 6 mm stone within the right kidney.  The left kidney appears normal without evidence of urinary obstruction.  Normal noncontrast appearance of the bilateral adrenal glands, pancreas and spleen.  Enteric tube terminates within the distal esophagus.  There is extensive postsurgical change of the bowel with multiple enteric anastomoses.  Enteric contrast is seen to the mid small bowel. There is moderate dilatation of multiple loops of small bowel with index of the small bowel within the left mid hemiabdomen measuring approximately 4.5 cm in greatest coronal dimension (image 27). There is an apparent transition site within the left lower abdomen ( coronal image 24) with decompression of the distal loops of small bowel.  Small bowel feces are seen within the most distal dilated loops of small bowel.  Right lower quadrant and colostomy with multiple  decompressed distal loops of bowel.  No pneumoperitoneum, pneumatosis or portal venous gas.  Scattered minimal atherosclerotic calcifications within a mildly tortuous but nonaneurysmal abdominal aorta.  The major branch vessels of the abdominal aorta, including the IMA, are patent. Shoddy retroperitoneal lymph nodes are not enlarged by CT criteria.  The urinary bladder is distended.  There is an approximately 1.8 x 1.8 cm hypoattenuating (18 HU) fluid collection (image 68, series 2) within the cranial aspect of the vaginal cuff.  No free fluid within the pelvis.  Limited visualization of the lower thorax demonstrates minimal bibasilar linear atelectasis.  No focal airspace opacity.  No pleural effusion.  Normal heart size.  No pericardial effusion.  No acute or aggressive osseous abnormalities.  Mild to moderate multilevel lumbar spine degenerative change.  Bone island within the T12 vertebral body.  IMPRESSION: 1.  Extensive postsurgical change of the bowel with a high-grade small bowel obstruction with transition point within the left lower abdominal quadrant, presumably due to adhesions.  2.  Moderate right-sided hydronephrosis with delayed right-sided urogram.  The right-sided renal collecting system appears to drain into the adjacent loop of small bowel.  The chronicity of these findings is indeterminate in the absence of prior examinations.  3. Small amount of fluid within the cranial aspect of the vaginal cuff.  4. Enteric tube terminates within the distal esophagus. Advancement is recommended.  Original Report Authenticated By: JRachel Moulds M.D.   Dg Abd 2 Views  08/04/2011  *RADIOLOGY REPORT*  Clinical Data: 61year old female with abdominal pain and distention.  NG tube.  History of ileostomy.  ABDOMEN - 2 VIEW  Comparison: 08/04/2011 and earlier.  Findings: No pneumoperitoneum.  The lung bases are within normal limits.  Stable enteric tube.  Stable right upper quadrant surgical clips, not related  to cholecystectomy.  The colon appeared to largely be surgically absent on the recent cross-sectional imaging. Continued mild to moderately dilated bowel loops in  the right upper quadrant with fluid levels.  These measure up to 55 mm diameter, stable or mildly decreased.  Decompressed appearing loops in the pelvis.  Stable scoliosis and osseous structures.  IMPRESSION: 1.  Stable to mildly decreased small bowel dilatation.  No free air. 2.  Stable enteric tube.  Original Report Authenticated By: Randall An, M.D.   Dg Abd 2 Views  08/03/2011  *RADIOLOGY REPORT*  Clinical Data: Nausea, abdominal distention, pain.  Ileostomy. History of small bowel obstruction.  ABDOMEN - 2 VIEW  Comparison: None.  Findings: Surgical clips in the right abdomen.  There are dilated small bowel loops with air-fluid levels concerning for small bowel obstruction.  No organomegaly.  Small calcification projects over the right renal lower pole, likely small nonobstructing stone.  Lung bases are clear.  Levoscoliosis in the lumbar spine with associated degenerative changes.  No acute bony abnormality.  IMPRESSION: Dilated small bowel with air-fluid levels concerning for small bowel obstruction.  Right nephrolithiasis suspected.  Original Report Authenticated By: Raelyn Number, M.D.   Dg Abd Portable 2v  08/04/2011  *RADIOLOGY REPORT*  Clinical Data: Follow-up small bowel obstruction and evaluate NG tube position.  PORTABLE ABDOMEN - 2 VIEW  Comparison: CT scan of the abdomen and plain films of the abdomen 08/03/2011  Findings: Contrast fills the distal small bowel and colon, as well as the bladder from the recent CT scan.  Nasogastric tube is coiled in the stomach with its tip in the duodenum.  Decubitus film demonstrates no free air.  Moderately dilated one or more mid abdominal small bowel loops persist, but there is decrease size and number of multiple other small bowel loops demonstrated previously suggesting interval  improvement. Contrast is present within the right colon although no definite rectal contrast or appear/stool can be seen.  IMPRESSION: Satisfactory nasogastric tube placement.  Improved small bowel obstruction, although there is persistence of one more mid abdominal small bowel loops without free air.  See comments above.  Original Report Authenticated By: Staci Righter, M.D.     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   I have carefully reviewed Deanna Burke x-rays and discussed her situation with her. I see no evidence of fistula between her urinary tract and bowel. She does have a markedly dilated right renal pelvis which certainly adjacent to a loop of bowel and there are number of clips in that area from prior surgical procedures. There is great difficulty seeing her proximal and more distal ureter. 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 She did also have some extrinsic compression in that area from prior surgery and scar. It is very difficult to know of the acuteness of this hydronephrosis. She certainly does not appear to be having any significant flank discomfort and I don't think that this is germane to her current admission and GI problem. I do feel that if she is explored on a non-emergent basis it would be quite reasonable to add a cystoscopy, retrograde pyelogram and stent placement to her surgical procedure. Obviously if she needs emergent surgery due to a substantial decline in her status and the skin the done at a later date. If her bowel obstruction can be managed non-operatively then certainly we can address her hydronephrosis at a later date after  her bowel obstruction resolves. I have noted on multiple imaging that she does appear to have a  distended bladder. She feels as though she has had some long-standing issues with hesitancy and incomplete bladder emptying and I suspect this placed quarter roll in her recurrent cystitis. As long as she is voiding okay and her renal function doesn't deteriorate I think it BE reasonable to monitor this. Otherwise consideration for an indwelling Foley catheter will need to be made. I will continue to monitor her here in the hospital.  Marcie Bal. 08/04/2011, 1:56 PM

## 2011-08-04 NOTE — Progress Notes (Signed)
Small bowel obstruction  Subjective: Feels a little better. Slightly less pain, no nausea, no output via ostomy  Objective: Vital signs in last 24 hours: Temp:  [97.7 F (36.5 C)-98.5 F (36.9 C)] 97.7 F (36.5 C) (04/12 0600) Pulse Rate:  [73-109] 81  (04/12 0600) Resp:  [16-20] 18  (04/12 0600) BP: (125-152)/(60-84) 134/64 mmHg (04/12 0600) SpO2:  [97 %-100 %] 97 % (04/12 0600) Weight:  [165 lb (74.844 kg)] 165 lb (74.844 kg) (04/11 1819)    Intake/Output from previous day: 04/11 0701 - 04/12 0700 In: 625 [I.V.:625] Out: 550 [Urine:550] Intake/Output this shift: Total I/O In: -  Out: 450 [Urine:450]  General appearance: cooperative and mild distress Resp: clear to auscultation bilaterally GI: Slightly distended, soft, mild direct tender. No output via ostomy,  Lab Results:  Results for orders placed during the hospital encounter of 08/03/11 (from the past 24 hour(s))  CBC     Status: Normal   Collection Time   08/03/11 10:35 AM      Component Value Range   WBC 5.6  4.0 - 10.5 (K/uL)   RBC 4.35  3.87 - 5.11 (MIL/uL)   Hemoglobin 13.3  12.0 - 15.0 (g/dL)   HCT 37.7  36.0 - 46.0 (%)   MCV 86.7  78.0 - 100.0 (fL)   MCH 30.6  26.0 - 34.0 (pg)   MCHC 35.3  30.0 - 36.0 (g/dL)   RDW 12.5  11.5 - 15.5 (%)   Platelets 288  150 - 400 (K/uL)  BASIC METABOLIC PANEL     Status: Abnormal   Collection Time   08/03/11 10:35 AM      Component Value Range   Sodium 139  135 - 145 (mEq/L)   Potassium 3.2 (*) 3.5 - 5.1 (mEq/L)   Chloride 99  96 - 112 (mEq/L)   CO2 26  19 - 32 (mEq/L)   Glucose, Bld 106 (*) 70 - 99 (mg/dL)   BUN 27 (*) 6 - 23 (mg/dL)   Creatinine, Ser 1.50 (*) 0.50 - 1.10 (mg/dL)   Calcium 10.3  8.4 - 10.5 (mg/dL)   GFR calc non Af Amer 36 (*) >90 (mL/min)   GFR calc Af Amer 42 (*) >90 (mL/min)  LIPASE, BLOOD     Status: Normal   Collection Time   08/03/11 10:35 AM      Component Value Range   Lipase 44  11 - 59 (U/L)  HEPATIC FUNCTION PANEL     Status:  Abnormal   Collection Time   08/03/11 10:35 AM      Component Value Range   Total Protein 8.0  6.0 - 8.3 (g/dL)   Albumin 4.3  3.5 - 5.2 (g/dL)   AST 28  0 - 37 (U/L)   ALT 25  0 - 35 (U/L)   Alkaline Phosphatase 99  39 - 117 (U/L)   Total Bilirubin 1.2  0.3 - 1.2 (mg/dL)   Bilirubin, Direct 0.2  0.0 - 0.3 (mg/dL)   Indirect Bilirubin 1.0 (*) 0.3 - 0.9 (mg/dL)  URINALYSIS, ROUTINE W REFLEX MICROSCOPIC     Status: Normal   Collection Time   08/03/11 10:17 PM      Component Value Range   Color, Urine YELLOW  YELLOW    APPearance CLEAR  CLEAR    Specific Gravity, Urine 1.027  1.005 - 1.030    pH 6.5  5.0 - 8.0    Glucose, UA NEGATIVE  NEGATIVE (mg/dL)   Hgb urine dipstick NEGATIVE  NEGATIVE    Bilirubin Urine NEGATIVE  NEGATIVE    Ketones, ur NEGATIVE  NEGATIVE (mg/dL)   Protein, ur NEGATIVE  NEGATIVE (mg/dL)   Urobilinogen, UA 0.2  0.0 - 1.0 (mg/dL)   Nitrite NEGATIVE  NEGATIVE    Leukocytes, UA NEGATIVE  NEGATIVE   BASIC METABOLIC PANEL     Status: Abnormal   Collection Time   08/04/11  4:32 AM      Component Value Range   Sodium 135  135 - 145 (mEq/L)   Potassium 3.0 (*) 3.5 - 5.1 (mEq/L)   Chloride 99  96 - 112 (mEq/L)   CO2 26  19 - 32 (mEq/L)   Glucose, Bld 121 (*) 70 - 99 (mg/dL)   BUN 19  6 - 23 (mg/dL)   Creatinine, Ser 1.27 (*) 0.50 - 1.10 (mg/dL)   Calcium 9.2  8.4 - 10.5 (mg/dL)   GFR calc non Af Amer 44 (*) >90 (mL/min)   GFR calc Af Amer 51 (*) >90 (mL/min)     Studies/Results Radiology     MEDS, Scheduled    . ciprofloxacin  400 mg Intravenous Q12H  . iohexol  20 mL Oral Q1 Hr x 2  .  morphine injection  4 mg Intravenous Once  .  morphine injection  4 mg Intravenous Once  . ondansetron (ZOFRAN) IV  4 mg Intravenous Once  . ondansetron (ZOFRAN) IV  4 mg Intravenous Once  . pantoprazole (PROTONIX) IV  40 mg Intravenous QHS  . potassium chloride  10 mEq Intravenous Q1 Hr x 3  . sodium chloride  1,000 mL Intravenous Once     Assessment: Small  bowel obstruction SBO secondary to adhesions x ray maybe slightly better Right hydronephrosis Breast cancer  Plan: Will continue NG, follow up xray in AM Urology consult pending Will go ahead with radiation for breast cancer today  LOS: 1 day    Haywood Lasso, MD, [facility], [place]   08/04/2011 9:23 AM

## 2011-08-04 NOTE — Telephone Encounter (Signed)
Nevin Bloodgood, RN for pt in rm [phone] states Dr Margot Chimes was in to see pt, and stated pt may receive radiation tx today. Notified Claudia, RT, linac #2.

## 2011-08-05 LAB — CBC
HCT: 35.5 % — ABNORMAL LOW (ref 36.0–46.0)
Hemoglobin: 12.2 g/dL (ref 12.0–15.0)
MCH: 30.5 pg (ref 26.0–34.0)
MCHC: 34.4 g/dL (ref 30.0–36.0)
MCV: 88.8 fL (ref 78.0–100.0)
Platelets: ADEQUATE K/uL (ref 150–400)
RBC: 4 MIL/uL (ref 3.87–5.11)
RDW: 13 % (ref 11.5–15.5)
WBC: 3.7 K/uL — ABNORMAL LOW (ref 4.0–10.5)

## 2011-08-05 LAB — URINE CULTURE: Culture: NO GROWTH

## 2011-08-05 LAB — BASIC METABOLIC PANEL
BUN: 14 mg/dL (ref 6–23)
Chloride: 101 mEq/L (ref 96–112)
GFR calc Af Amer: 54 mL/min — ABNORMAL LOW (ref >90)
Potassium: 4 mEq/L (ref 3.5–5.1)

## 2011-08-05 MED ORDER — LORAZEPAM 2 MG/ML IJ SOLN
0.5000 mg | Freq: Once | INTRAMUSCULAR | Status: AC
  Administered 2011-08-05: 0.5 mg via INTRAVENOUS
  Filled 2011-08-05: qty 1

## 2011-08-05 NOTE — Progress Notes (Signed)
Small bowel obstruction  Subjective: Feels about the same. No gas or BM. Not much pain, no nausea with NG  Objective: Vital signs in last 24 hours: Temp:  [97.8 F (36.6 C)-98.7 F (37.1 C)] 97.8 F (36.6 C) (04/13 0600) Pulse Rate:  [80-99] 86  (04/13 0600) Resp:  [18-20] 20  (04/13 0600) BP: (126-149)/(61-80) 139/75 mmHg (04/13 0600) SpO2:  [97 %-98 %] 98 % (04/13 0600) Last BM Date:  (pt. has ileostomy)  Intake/Output from previous day: 04/12 0701 - 04/13 0700 In: 1160 [I.V.:800; NG/GT:60; IV Piggyback:300] Out: 850 [Urine:750; Emesis/NG output:100] Intake/Output this shift: Total I/O In: 30 [NG/GT:30] Out: -   General appearance: alert, cooperative and no distress GI: Still distended, mildly tender, but no peritoneal signs. BS few, but somwhat high pitched  Lab Results:  Results for orders placed during the hospital encounter of 08/03/11 (from the past 24 hour(s))  CBC     Status: Abnormal   Collection Time   08/05/11  5:21 AM      Component Value Range   WBC 3.7 (*) 4.0 - 10.5 (K/uL)   RBC 4.00  3.87 - 5.11 (MIL/uL)   Hemoglobin 12.2  12.0 - 15.0 (g/dL)   HCT 35.5 (*) 36.0 - 46.0 (%)   MCV 88.8  78.0 - 100.0 (fL)   MCH 30.5  26.0 - 34.0 (pg)   MCHC 34.4  30.0 - 36.0 (g/dL)   RDW 13.0  11.5 - 15.5 (%)   Platelets    150 - 400 (K/uL)   Value: PLATELET CLUMPS NOTED ON SMEAR, COUNT APPEARS ADEQUATE  BASIC METABOLIC PANEL     Status: Abnormal   Collection Time   08/05/11  5:21 AM      Component Value Range   Sodium 135  135 - 145 (mEq/L)   Potassium 4.0  3.5 - 5.1 (mEq/L)   Chloride 101  96 - 112 (mEq/L)   CO2 24  19 - 32 (mEq/L)   Glucose, Bld 123 (*) 70 - 99 (mg/dL)   BUN 14  6 - 23 (mg/dL)   Creatinine, Ser 1.21 (*) 0.50 - 1.10 (mg/dL)   Calcium 9.2  8.4 - 10.5 (mg/dL)   GFR calc non Af Amer 47 (*) >90 (mL/min)   GFR calc Af Amer 54 (*) >90 (mL/min)     Studies/Results Radiology     MEDS, Scheduled    . ciprofloxacin  400 mg Intravenous Q12H  .  LORazepam  0.5 mg Intravenous Once  . pantoprazole (PROTONIX) IV  40 mg Intravenous QHS  . potassium chloride  10 mEq Intravenous Q1 Hr x 3  . potassium chloride  10 mEq Intravenous Once     Assessment: Small bowel obstruction SBO likely secondary to adhesions Right UPJ obstruction  Plan: Will continue ng and repeat labs and xrays in the am. She has had several surgeries for SBO, the most recent in 1996 by Dr Excell Seltzer. This will likely be a difficult operation and a difficult recovery. Since there is no signs of deterioration clinically, I think it is appropriate to wait a while and see if this will resolve. Urology want to do retrograde if we put her to sleep for SBO.  LOS: 2 days    Haywood Lasso, MD, [facility], [place]   08/05/2011 9:50 AM

## 2011-08-05 NOTE — Progress Notes (Signed)
Spoke to Dr. Georgette Dover informed him that patient had no output from NGT last night and only 61m today, trouble flushing and patient was firm and distended, backed out NGT approx 1inch and now over [id number] from NGEagletates ok.

## 2011-08-05 NOTE — Progress Notes (Signed)
Patient complaining that abd feels firm and distended, continues to have bowel sounds. ngt difficult to flush and not sumping, backed out NGT approx 1inch and checked for placement via auscultation. Flushed with NS and sump flushed with air, immediately found to have 286m out and patient soft and less distended. Will continue to monitor.

## 2011-08-06 ENCOUNTER — Inpatient Hospital Stay (HOSPITAL_COMMUNITY): Payer: PRIVATE HEALTH INSURANCE

## 2011-08-06 LAB — CBC
Hemoglobin: 11.7 g/dL — ABNORMAL LOW (ref 12.0–15.0)
MCHC: 33.1 g/dL (ref 30.0–36.0)

## 2011-08-06 LAB — BASIC METABOLIC PANEL
GFR calc Af Amer: 53 mL/min — ABNORMAL LOW (ref >90)
GFR calc non Af Amer: 46 mL/min — ABNORMAL LOW (ref >90)
Glucose, Bld: 115 mg/dL — ABNORMAL HIGH (ref 70–99)
Potassium: 3.7 mEq/L (ref 3.5–5.1)
Sodium: 136 mEq/L (ref 135–145)

## 2011-08-06 NOTE — Progress Notes (Signed)
  Subjective: Feels much better this AM after ng tube repositioned.  Less distended.  No BM or flatus.  Objective: Vital signs in last 24 hours: Temp:  [98.1 F (36.7 C)-98.6 F (37 C)] 98.6 F (37 C) (04/14 0431) Pulse Rate:  [80-87] 87  (04/14 0431) Resp:  [18] 18  (04/14 0431) BP: (132-144)/(52-78) 132/52 mmHg (04/14 0431) SpO2:  [98 %-100 %] 98 % (04/14 0431) Last BM Date:  (no output in ileostomy)  Intake/Output from previous day: 04/13 0701 - 04/14 0700 In: 3142.9 [I.V.:3072.9; NG/GT:60; IV Piggyback:10] Out: 2275 [Urine:900; Emesis/NG output:1375] Intake/Output this shift: Total I/O In: 30 [NG/GT:30] Out: 200 [Urine:200]  PE: Soft, nontender, nothing in ileostomy bag, rare bowel sound  Lab Results:   Basename 08/06/11 0530 08/05/11 0521  WBC 3.7* 3.7*  HGB 11.7* 12.2  HCT 35.4* 35.5*  PLT 249 PLATELET CLUMPS NOTED ON SMEAR, COUNT APPEARS ADEQUATE   BMET  Basename 08/06/11 0530 08/05/11 0521  NA 136 135  K 3.7 4.0  CL 103 101  CO2 25 24  GLUCOSE 115* 123*  BUN 11 14  CREATININE 1.23* 1.21*  CALCIUM 9.4 9.2   PT/INR No results found for this basename: LABPROT:2,INR:2 in the last 72 hours Comprehensive Metabolic Panel:    Component Value Date/Time   NA 136 08/06/2011 0530   K 3.7 08/06/2011 0530   CL 103 08/06/2011 0530   CO2 25 08/06/2011 0530   BUN 11 08/06/2011 0530   CREATININE 1.23* 08/06/2011 0530   GLUCOSE 115* 08/06/2011 0530   CALCIUM 9.4 08/06/2011 0530   AST 28 08/03/2011 1035   ALT 25 08/03/2011 1035   ALKPHOS 99 08/03/2011 1035   BILITOT 1.2 08/03/2011 1035   PROT 8.0 08/03/2011 1035   ALBUMIN 4.3 08/03/2011 1035     Studies/Results: Dg Abd 2 Views  08/28/2011  *RADIOLOGY REPORT*  Clinical Data: 61 year old female with abdominal pain and distention.  NG tube.  History of ileostomy.  ABDOMEN - 2 VIEW  Comparison: 08-28-2011 and earlier.  Findings: No pneumoperitoneum.  The lung bases are within normal limits.  Stable enteric tube.  Stable  right upper quadrant surgical clips, not related to cholecystectomy.  The colon appeared to largely be surgically absent on the recent cross-sectional imaging. Continued mild to moderately dilated bowel loops in the right upper quadrant with fluid levels.  These measure up to 55 mm diameter, stable or mildly decreased.  Decompressed appearing loops in the pelvis.  Stable scoliosis and osseous structures.  IMPRESSION: 1.  Stable to mildly decreased small bowel dilatation.  No free air. 2.  Stable enteric tube.  Original Report Authenticated By: Randall An, M.D.    Anti-infectives: Anti-infectives     Start     Dose/Rate Route Frequency Ordered Stop   08/03/11 2200   ciprofloxacin (CIPRO) IVPB 400 mg        400 mg 200 mL/hr over 60 Minutes Intravenous Every 12 hours 08/03/11 2054            Assessment Principal Problem:  *Small bowel obstruction-feels a little better, but no flatus or BM Active Problems:  Hydroureter on right    LOS: 3 days   Plan: Check x-rays today.  If no significant improvement in a day or two, may need exploratory lap.  Would also have Urology due cystoscopy with retrograde of right ureter.   Deanna Burke 08/06/2011

## 2011-08-06 NOTE — Progress Notes (Signed)
Called to room by patient, having scant amt bright bloody drainage from NG tube into wall suction. Patient has been c/o nose itching and running, has been wiping nose vigorously with tissues. Checked NG tube for placement via auscultation and flushed with NS, returned to LIWS and contents returned to light green color. Will continue to monitor.

## 2011-08-06 NOTE — Progress Notes (Signed)
Patient ID: Deanna Burke, female   DOB: [birth year], 61 y.o.   MRN: [id number]   Subjective: Patient reports not much change over the course of the evening. Much better NG tube output with repositioning of the tube. Still not much in the way of any air or stool in her ostomy bag. No urologic change.  Objective: Vital signs in last 24 hours: Temp:  [98.1 F (36.7 C)-98.6 F (37 C)] 98.6 F (37 C) (04/14 0431) Pulse Rate:  [80-87] 87  (04/14 0431) Resp:  [18] 18  (04/14 0431) BP: (132-144)/(52-78) 132/52 mmHg (04/14 0431) SpO2:  [98 %-100 %] 98 % (04/14 0431)  Intake/Output from previous day: 04/13 0701 - 04/14 0700 In: 3142.9 [I.V.:3072.9; NG/GT:60; IV Piggyback:10] Out: 2275 [Urine:900; Emesis/NG output:1375] Intake/Output this shift: Total I/O In: 30 [NG/GT:30] Out: 200 [Urine:200]  Physical Exam:  No significant change  Lab Results:  Basename 08/06/11 0530 08/05/11 0521 08/03/11 1035  HGB 11.7* 12.2 13.3  HCT 35.4* 35.5* 37.7   BMET  Basename 08/06/11 0530 08/05/11 0521  NA 136 135  K 3.7 4.0  CL 103 101  CO2 25 24  GLUCOSE 115* 123*  BUN 11 14  CREATININE 1.23* 1.21*  CALCIUM 9.4 9.2   No results found for this basename: LABPT:3,INR:3 in the last 72 hours No results found for this basename: LABURIN:1 in the last 72 hours Results for orders placed during the hospital encounter of 08/03/11  URINE CULTURE     Status: Normal   Collection Time   08/03/11 10:17 PM      Component Value Range Status Comment   Specimen Description URINE, CLEAN CATCH   Final    Special Requests levaquin, doxycycline   Final    Culture  Setup Time [id number]   Final    Colony Count NO GROWTH   Final    Culture NO GROWTH   Final    Report Status 08/05/2011 FINAL   Final     Studies/Results: Dg Abd 2 Views  08-15-2011  *RADIOLOGY REPORT*  Clinical Data: 61 year old female with abdominal pain and distention.  NG tube.  History of ileostomy.  ABDOMEN - 2 VIEW  Comparison:  15-Aug-2011 and earlier.  Findings: No pneumoperitoneum.  The lung bases are within normal limits.  Stable enteric tube.  Stable right upper quadrant surgical clips, not related to cholecystectomy.  The colon appeared to largely be surgically absent on the recent cross-sectional imaging. Continued mild to moderately dilated bowel loops in the right upper quadrant with fluid levels.  These measure up to 55 mm diameter, stable or mildly decreased.  Decompressed appearing loops in the pelvis.  Stable scoliosis and osseous structures.  IMPRESSION: 1.  Stable to mildly decreased small bowel dilatation.  No free air. 2.  Stable enteric tube.  Original Report Authenticated By: Randall An, M.D.    Assessment/Plan:   Plan at this point will be as per previous notes. We'll attempt to perform cystoscopy retrograde pyelogram and double-J stent placement if abdominal exploration is required in the next few days. If conservative management successful then urologic evaluation will be done at a later date after the patient resolved her acute issues.   LOS: 3 days   Jackelyne Sayer S 08/06/2011, 10:05 AM

## 2011-08-07 ENCOUNTER — Ambulatory Visit
Admission: RE | Admit: 2011-08-07 | Discharge: 2011-08-07 | Disposition: A | Discharge status: Home / Self Care | Payer: PRIVATE HEALTH INSURANCE | Source: Ambulatory Visit | Attending: Radiation Oncology | Admitting: Radiation Oncology

## 2011-08-07 ENCOUNTER — Encounter: Payer: Self-pay | Admitting: Radiation Oncology

## 2011-08-07 DIAGNOSIS — C50419 Malignant neoplasm of upper-outer quadrant of unspecified female breast: Secondary | ICD-10-CM

## 2011-08-07 MED ORDER — RADIAPLEXRX EX GEL
Freq: Once | CUTANEOUS | Status: AC
  Administered 2011-08-07: 12:00:00 via TOPICAL

## 2011-08-07 NOTE — Progress Notes (Signed)
  Subjective: Says she feels better, some gas and stool in ostomy bag.  Objective: Vital signs in last 24 hours: Temp:  [97.7 F (36.5 C)-97.9 F (36.6 C)] 97.9 F (36.6 C) (04/15 0637) Pulse Rate:  [81-85] 81  (04/15 0637) Resp:  [18] 18  (04/15 0637) BP: (134-136)/(63-70) 135/69 mmHg (04/15 0637) SpO2:  [97 %-100 %] 97 % (04/15 0637) Last BM Date:  (no output in ileostomy) Afebrile, VSS, 1100 ml/ NG yesterday. Green in [place], more light brown in tube now Intake/Output from previous day: Aug 07, 2022 0701 - 04/15 0700 In: 4292.1 [I.V.:2802.1; NG/GT:90; IV Piggyback:1400] Out: 2400 [Urine:1300; Emesis/NG output:1100] Intake/Output this shift:    General appearance: alert, cooperative and no distress Resp: clear to auscultation bilaterally GI: soft, non-tender; bowel sounds normal; no masses,  no organomegaly and some Gas and small amount of stool in ostomy bag.  Lab Results:   Basename 08-07-2011 0530 08/05/11 0521  WBC 3.7* 3.7*  HGB 11.7* 12.2  HCT 35.4* 35.5*  PLT 249 PLATELET CLUMPS NOTED ON SMEAR, COUNT APPEARS ADEQUATE    BMET  Basename 2011-08-07 0530 08/05/11 0521  NA 136 135  K 3.7 4.0  CL 103 101  CO2 25 24  GLUCOSE 115* 123*  BUN 11 14  CREATININE 1.23* 1.21*  CALCIUM 9.4 9.2   PT/INR No results found for this basename: LABPROT:2,INR:2 in the last 72 hours   Lab 08/03/11 1035  AST 28  ALT 25  ALKPHOS 99  BILITOT 1.2  PROT 8.0  ALBUMIN 4.3     Lipase     Component Value Date/Time   LIPASE 44 08/03/2011 1035     Studies/Results: Dg Abd 2 Views  08/07/2011  *RADIOLOGY REPORT*  Clinical Data: Small bowel obstruction  ABDOMEN - 2 VIEW  Comparison: Abdominal film 08/04/2011  Findings: NG tube extends to the gastric antrum.  There are dilated loops of small bowel measuring up to 3 cm in diameter.  There is short air fluid levels.  No gas in the rectum.  There are surgical clips in the lower pelvis on the left.  No intraperitoneal free air on the  upright exam.  IMPRESSION: No changes in small bowel obstruction pattern. No intraperitoneal free air.  Original Report Authenticated By: Suzy Bouchard, M.D.    Medications:    . ciprofloxacin  400 mg Intravenous Q12H  . 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 ER:+, PR+, followed by Dr. Ninfa Linden, Currently having radiation therapy.  Hx: Total Colectomy, Ileostomy 1973 for ulcerative colitis,Exploratory laparotomy with lysis of adhesions 1978 & 1990,Vaginoplasty, Appendectomy  Pulmonary embolus  Sleep apnea  Hypertension  Mild Renal Insuffiency   Plan:  I was going to consider clamping NG, but will wait for now with 1100 out yesterday.  Continue to mobilize.      LOS: 4 days    Deanna Burke 08/07/2011

## 2011-08-07 NOTE — Progress Notes (Signed)
[facility] Therapy Treatment Record Phone [phone]   Radiation Therapy was administered to Deanna Burke on: 08/07/2011  11:01 AM and was treatment # 10 out of a planned course of 30 treatments.

## 2011-08-07 NOTE — Progress Notes (Signed)
   Weekly Management Note DCIS of the left breast, Inpatient Current Dose:  2000 cGy  Projected Dose: 6000 cGy   Narrative:  The patient presents for routine under treatment assessment.  CBCT/MVCT images/Port film x-rays were reviewed.  The chart was checked. She was recently admitted for small bowel obstruction.  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 This is assumed to be the underlying cause of her recurrent urinary tract infections  Physical Findings: Weight:  . She has a nasogastric tube in place. She has minimal erythema thus far over her left breast.  Impression:  The patient is tolerating radiotherapy.  Plan:  Continue radiotherapy as planned.

## 2011-08-07 NOTE — Progress Notes (Signed)
UR complete 

## 2011-08-07 NOTE — Progress Notes (Signed)
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

## 2011-08-07 NOTE — Progress Notes (Signed)
General Surgery [facility] - Attending  Patient known to me from admission.  Currently in radiation therapy.  Discussed with family at bedside.  Will monitor NG output and ostomy function for an additional 24 hours.  Hopefully SBO resolving.  Earnstine Regal, MD, [facility] Office: [phone]

## 2011-08-08 ENCOUNTER — Inpatient Hospital Stay (HOSPITAL_COMMUNITY): Payer: PRIVATE HEALTH INSURANCE

## 2011-08-08 ENCOUNTER — Ambulatory Visit: Admission: RE | Admit: 2011-08-08 | Payer: PRIVATE HEALTH INSURANCE | Source: Ambulatory Visit

## 2011-08-08 DIAGNOSIS — R6884 Jaw pain: Secondary | ICD-10-CM

## 2011-08-08 DIAGNOSIS — M542 Cervicalgia: Secondary | ICD-10-CM

## 2011-08-08 LAB — CBC
Hemoglobin: 11.4 g/dL — ABNORMAL LOW (ref 12.0–15.0)
MCH: 30.7 pg (ref 26.0–34.0)
RBC: 3.71 MIL/uL — ABNORMAL LOW (ref 3.87–5.11)
WBC: 7.5 10*3/uL (ref 4.0–10.5)

## 2011-08-08 LAB — COMPREHENSIVE METABOLIC PANEL
ALT: 22 U/L (ref 0–35)
Alkaline Phosphatase: 94 U/L (ref 39–117)
CO2: 26 mEq/L (ref 19–32)
Chloride: 98 mEq/L (ref 96–112)
GFR calc Af Amer: 57 mL/min — ABNORMAL LOW (ref >90)
GFR calc non Af Amer: 49 mL/min — ABNORMAL LOW (ref >90)
Glucose, Bld: 137 mg/dL — ABNORMAL HIGH (ref 70–99)
Potassium: 4.1 mEq/L (ref 3.5–5.1)
Sodium: 133 mEq/L — ABNORMAL LOW (ref 135–145)
Total Bilirubin: 1.2 mg/dL (ref 0.3–1.2)

## 2011-08-08 MED ORDER — METHOCARBAMOL 100 MG/ML IJ SOLN
500.0000 mg | Freq: Once | INTRAVENOUS | Status: AC
  Administered 2011-08-08: 500 mg via INTRAVENOUS
  Filled 2011-08-08: qty 5

## 2011-08-08 MED ORDER — IOHEXOL 300 MG/ML  SOLN
100.0000 mL | Freq: Once | INTRAMUSCULAR | Status: AC | PRN
  Administered 2011-08-08: 80 mL via INTRAVENOUS

## 2011-08-08 MED ORDER — HEPARIN SODIUM (PORCINE) 5000 UNIT/ML IJ SOLN
5000.0000 [IU] | Freq: Three times a day (TID) | INTRAMUSCULAR | Status: DC
  Administered 2011-08-08 – 2011-08-09 (×3): 5000 [IU] via SUBCUTANEOUS
  Filled 2011-08-08 (×6): qty 1

## 2011-08-08 NOTE — Progress Notes (Signed)
General Surgery [facility] - Attending  Patient seen and examined.  Labs and x-rays reviewed.  Family and friends at bedside.  Patient complains of severe pain bilaterally at area of parotid and submandibular glands, anterior cervical chains.  No palpable mass, induration, or evidence of cellulitis.  Pain unrelieved by narcotics.  No previous history.  Will request ENT consultation.  WBC normal.  Patient notes minimal output from ileostomy.  AXR today shows persistent SBO with little to no improvement despite NG decompression.  Will likely require laparotomy and lysis of adhesions.  Will coordinate with urology for retrograde evaluation and stent placement to right ureter concurrently.  Likely to OR next 1-2 days pending ENT evaluation and coordination of OR schedule.  Discussed with patient and family who understand and agree to proceed.  Earnstine Regal, MD, [facility] Office: [phone]

## 2011-08-08 NOTE — Consult Note (Addendum)
Deanna Burke, Pfannenstiel [id number] March 26, 1951 Md Ccs, MD  Reason for Consult: bilateral face and neck pain   HPI: admitted for small bowel obstruction, had remote partial bowel resection and ostomy for Crohn's. Also being treated for breast cancer currently. Normal white count although has increase from ~3.5 to 7.5 over the past 2 days. Has been NPO. Has been complaining of severe bilateral face and neck pain since yesterday which is being treated with morphine. ENT consulted for the face and neck pain.  Allergies:  Allergies  Allergen Reactions  . Demerol Nausea And Vomiting  . Dilaudid (Hydromorphone Hcl) Nausea And Vomiting  . Stadol (Butorphanol Tartrate) Nausea And Vomiting  . Amoxicillin Rash  . Keflex Rash  . Penicillins Rash    ROS: + bilateral face and neck pain. + abdominal pain. Review of systems negative x  10 systems except per HPI.  PMH:  Past Medical History  Diagnosis Date  . Ulcerative colitis [phone]  . Pulmonary embolus 1971  . Phlebitis 1971  . Small bowel obstruction [id number]  . Small bowel perforation 1971/1996  . Large bowel perforation 1971/1996  . Scoliosis   . Torn rotator cuff 2005  . Sleep apnea 2009  . History of bone density study 2012  . Fatigue   . Cataract     Left Eye  . Hot flashes   . 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 Hypertension   . Cancer     DCIS L breast  . H/O ulcerative colitis   . Breast cancer   . Arthritis     hands, lumbar spine, hips, right ankle  . S/P ileostomy     FH:  Family History  Problem Relation Age of Onset  . Breast cancer Maternal Grandmother   . Stomach cancer Maternal Grandfather   . Cancer Paternal Grandmother     stomach  . Cancer Other     Breast    SH:  History   Social History  . Marital Status: Divorced    Spouse Name: N/A    Number of Children: N/A  . Years of Education: N/A   Occupational History  . Not on file.    Social History Main Topics  . Smoking status: Never Smoker   . Smokeless tobacco: Not on file  . Alcohol Use: No  . Drug Use: No  . Sexually Active: Not on file     MENARCHE 11, Nulliparity, MENOPAUSE 1996, HRT X 16 YEARS- STOPPED 04/21/11, BC X 2 YEARS   Other Topics Concern  . Not on file   Social History Narrative  . No narrative on file    PSH:  Past Surgical History  Procedure Date  . Colectomy 1973  . Ileostomy 1973  . Vaginoplasty 1975  . Exploratory laparotomy 1978/1990    with lysis of adhesions  . Eye surgery 05/08/2011    left cataract removal   . Appendectomy   . Rotator cuff repair 2006    Right  . Breast surgery 05/03/11    LEFT BREAST NEEDLE CORE BIOPSY- DCIS  . 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  ER:+, PR+,   . Abdominal hysterectomy 1996    partial    Physical  Exam: CN 2-12 grossly intact and symmetric. EAC/TMs normal BL. Oral cavity, lips, gums, ororpharynx normal with no masses or lesions. No  purulence from [facility] duct bilaterally. Skin warm and dry. Nasal cavity without polyps or purulence. External nose and ears without masses or lesions. EOMI, PERRLA. Neck supple with no masses or lesions. She has tenderness to palpation over the parotids, submandibular area, and TMJ bilaterally which is diffuse but I don't palpate any fluctuance in the salivary glands or neck bilaterally. No lymphadenopathy palpated. Thyroid normal with no masses.   A/P: bilateral face and neck pain of unknown etiology with essentially normal exam other than diffuse tenderness. I would recommend checking neck ultrasound or preferably CT neck with contrast to evaluate for parotitis/sialoadenitis. Could treat empirically with prednisone taper and antibiotics (doxycycline or bactrim) for possible sialoadenitis. Can continue cold compresses, and add sialogouges and parotid  massage.   Ruby Cola 08/08/2011 5:40 PM    I personally reviewed the neck CT from 08/08/11. CT shows completely normal, symmetric salivary glands with no sialolithiasis or sialoadenitis and no cervical or facial masses or lesions and no lymphadenopathy. Nothing on CT that would explain the patient's symptoms. The radiology read concurs with this. The patient could have temporomandibular joint pain but her pain is out of proportion with usual TMJ pain. Would recommend treating neck and jaw pain symptomatically and can follow up with ENT as needed.

## 2011-08-08 NOTE — Progress Notes (Signed)
Patient with pain to neck entire shift and medicated for pain with morphine 2 mg throughout the shift, PA Percell Miller aware and Gerkin aware, ice pack applied and patient stated this did give some relief, patient has not felt good and did not want to go to radiation treatment today, 323m of light green drainage to from NG tube, bowel sounds are hypoactive, patient medicated for nausea once this shift with zofran no emesis, family and friends at bedside and supportive will continue to monitor Means, Deanna Burke N 08-08-11 17:49pm

## 2011-08-08 NOTE — Progress Notes (Signed)
Spoke with PA Percell Miller concerning neck and jaw pain, patient's vital signs are stable, pt medicated with morphine with some relief Means, Pierre Dellarocco N 08-08-11 15:26pm

## 2011-08-08 NOTE — Progress Notes (Signed)
  Subjective: Complaining of neck and jaw pain started last PM before 8pm and ongoing all night,  Nurses have been treating her with Morphine and just contacted me to see her.  Objective: Vital signs in last 24 hours: Temp:  [98.4 F (36.9 C)] 98.4 F (36.9 C) (04/16 0552) Pulse Rate:  [76-85] 76  (04/16 0552) Resp:  [16-18] 16  (04/16 0552) BP: (127-136)/(63-76) 127/63 mmHg (04/16 0552) SpO2:  [97 %-98 %] 98 % (04/16 0552) Last BM Date:  (no output in ileostomy) Afebrile, VSS, no labs, 850 from NG Intake/Output from previous day: 04/15 0701 - 04/16 0700 In: 1125 [I.V.:1125] Out: 2350 [Urine:1500; Emesis/NG output:850] Intake/Output this shift:    General appearance: alert, moderate distress and uncomfortable and very anxious about it.  Ongoing since 8 PM last night. Head: Normocephalic, without obvious abnormality, atraumatic, I don't see any carries, or dental changes Neck: no adenopathy, no carotid bruit, no JVD, supple, symmetrical, trachea midline, thyroid not enlarged, symmetric,; she is very tender both side of jaw and neck to back of neck Back: negative, symmetric, no curvature. ROM normal. No CVA tenderness. Resp: clear to auscultation bilaterally Chest wall: no tenderness Breasts: normal appearance, no masses or tenderness Cardio: regular rate and rhythm, S1, S2 normal, no murmur, click, rub or gallop GI: soft, non-tender; bowel sounds normal; no masses,  no organomegaly and some stool and gas in colostomy Extremities: no edema, redness or tenderness in the calves or thighs  Lab Results:   Basename 08/06/11 0530  WBC 3.7*  HGB 11.7*  HCT 35.4*  PLT 249    BMET  Basename 08/06/11 0530  NA 136  K 3.7  CL 103  CO2 25  GLUCOSE 115*  BUN 11  CREATININE 1.23*  CALCIUM 9.4   PT/INR No results found for this basename: LABPROT:2,INR:2 in the last 72 hours   Lab 08/03/11 1035  AST 28  ALT 25  ALKPHOS 99  BILITOT 1.2  PROT 8.0  ALBUMIN 4.3     Lipase      Component Value Date/Time   LIPASE 44 08/03/2011 1035     Studies/Results: No results found.  Medications:    . ciprofloxacin  400 mg Intravenous Q12H  . methocarbamol (ROBAXIN) IV  500 mg Intravenous Once  . 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 ER:+, PR+, followed by Dr. Ninfa Linden, Currently having radiation therapy.  Hx: Total Colectomy, Ileostomy 1973 for ulcerative colitis,Exploratory laparotomy with lysis of adhesions 1978 & 1990,Vaginoplasty, Appendectomy  Pulmonary embolus  Sleep apnea  Hypertension  Mild Renal Insuffiency New neck and jaw pain, it seems very localize  The same on both sides,    Plan:  Check labs and film.  I will start her on heparin sq, she has a hx of PE, this seems very localized to neck and both sides of jaw.Dr. Harlow Asa will see shortly     LOS: 5 days    Deanna Burke 08/08/2011

## 2011-08-09 ENCOUNTER — Ambulatory Visit
Admission: RE | Admit: 2011-08-09 | Discharge: 2011-08-09 | Disposition: A | Discharge status: Home / Self Care | Payer: PRIVATE HEALTH INSURANCE | Source: Ambulatory Visit | Attending: Radiation Oncology | Admitting: Radiation Oncology

## 2011-08-09 ENCOUNTER — Encounter (HOSPITAL_COMMUNITY): Payer: Self-pay | Admitting: Anesthesiology

## 2011-08-09 ENCOUNTER — Other Ambulatory Visit: Payer: Self-pay | Admitting: Urology

## 2011-08-09 LAB — GLUCOSE, CAPILLARY

## 2011-08-09 MED ORDER — SODIUM CHLORIDE 0.9 % IJ SOLN: 10.0000 mL | INTRAMUSCULAR | Status: DC | PRN

## 2011-08-09 MED ORDER — KCL IN DEXTROSE-NACL 40-5-0.45 MEQ/L-%-% IV SOLN
INTRAVENOUS | Status: AC
  Filled 2011-08-09 (×3): qty 1000

## 2011-08-09 MED ORDER — FAT EMULSION 20 % IV EMUL
240.0000 mL | INTRAVENOUS | Status: AC
  Administered 2011-08-09: 240 mL via INTRAVENOUS
  Filled 2011-08-09: qty 250

## 2011-08-09 MED ORDER — INSULIN ASPART 100 UNIT/ML ~~LOC~~ SOLN
0.0000 [IU] | Freq: Three times a day (TID) | SUBCUTANEOUS | Status: DC
  Administered 2011-08-09 – 2011-08-12 (×7): 1 [IU] via SUBCUTANEOUS

## 2011-08-09 MED ORDER — TRACE MINERALS CR-CU-MN-SE-ZN 10-1000-500-60 MCG/ML IV SOLN
INTRAVENOUS | Status: AC
  Administered 2011-08-09: 19:00:00 via INTRAVENOUS
  Filled 2011-08-09: qty 1000

## 2011-08-09 MED ORDER — METRONIDAZOLE IN NACL 5-0.79 MG/ML-% IV SOLN: 500.0000 mg | INTRAVENOUS | Status: DC

## 2011-08-09 NOTE — Anesthesia Preprocedure Evaluation (Deleted)
Anesthesia Evaluation  Patient identified by MRN, date of birth, ID band Patient awake    Reviewed: Allergy & Precautions, H&P , NPO status , Patient's Chart, lab work & pertinent test results  Airway Mallampati: II TM Distance: >3 FB Neck ROM: Full    Dental No notable dental hx.    Pulmonary sleep apnea ,  breath sounds clear to auscultation  Pulmonary exam normal       Cardiovascular hypertension, Pt. on medications Rhythm:Regular Rate:Normal     Neuro/Psych negative neurological ROS  negative psych ROS   GI/Hepatic negative GI ROS, Neg liver ROS, H/o UC   Endo/Other  negative endocrine ROS  Renal/GU negative Renal ROS  negative genitourinary   Musculoskeletal negative musculoskeletal ROS (+)   Abdominal   Peds negative pediatric ROS (+)  Hematology negative hematology ROS (+)   Anesthesia Other Findings   Reproductive/Obstetrics negative OB ROS                           Anesthesia Physical Anesthesia Plan  ASA: III  Anesthesia Plan: General   Post-op Pain Management:    Induction: Intravenous, Rapid sequence and Cricoid pressure planned  Airway Management Planned: Oral ETT  Additional Equipment:   Intra-op Plan:   Post-operative Plan: Extubation in OR  Informed Consent: I have reviewed the patients History and Physical, chart, labs and discussed the procedure including the risks, benefits and alternatives for the proposed anesthesia with the patient or authorized representative who has indicated his/her understanding and acceptance.   Dental advisory given  Plan Discussed with: CRNA  Anesthesia Plan Comments:         Anesthesia Quick Evaluation

## 2011-08-09 NOTE — Progress Notes (Signed)
PARENTERAL NUTRITION CONSULT NOTE - INITIAL  Pharmacy Consult for TNA Indication: Prolonged Ileus  Allergies  Allergen Reactions  . Demerol Nausea And Vomiting  . Dilaudid (Hydromorphone Hcl) Nausea And Vomiting  . Stadol (Butorphanol Tartrate) Nausea And Vomiting  . Amoxicillin Rash  . Keflex Rash  . Penicillins Rash    Patient Measurements: Height: 5' 4"  (162.6 cm) Weight: 165 lb (74.844 kg) IBW/kg (Calculated) : 54.7  Adjusted Body Weight: 61 kg  Vital Signs: Temp: 98.4 F (36.9 C) (04/17 0630) Temp src: Oral (04/17 0630) BP: 106/64 mmHg (04/17 0630) Pulse Rate: 78  (04/17 0630) Intake/Output from previous day: 04/16 0701 - 04/17 0700 In: 5000 [I.V.:5000] Out: 3000 [Urine:2800; Emesis/NG output:200] Intake/Output from this shift: Total I/O In: -  Out: 500 [Urine:500]  Labs:  [facility] 08/08/11 1300  WBC 7.5  HGB 11.4*  HCT 32.7*  PLT 212  APTT --  INR --     Basename 08/08/11 1300  NA 133*  K 4.1  CL 98  CO2 26  GLUCOSE 137*  BUN 7  CREATININE 1.16*  LABCREA --  CREAT24HRUR --  CALCIUM 9.2  MG --  PHOS --  PROT 6.7  ALBUMIN 3.2*  AST 24  ALT 22  ALKPHOS 94  BILITOT 1.2  BILIDIR --  IBILI --  PREALBUMIN --  TRIG --  CHOLHDL --  CHOL --   Estimated Creatinine Clearance: 49.8 ml/min (by C-G formula based on Cr of 1.16).   No results found for this basename: GLUCAP:3 in the last 72 hours  Insulin Requirements in the past 24 hours:  None  Current Nutrition:  NPO x 1 week   IVF:  D5 1/2 NS with 40 KCL at 125 ml/hr.  Assessment:  49 YOF with h/o breast cancer, colectomy 40 years ago with h/o of SBOs with CT scan this admit revealed SBO and has been treated conservatively.  Repeat Abx X-ray shows persistent SBO with little improvement.  Will need laparotomy and lysis of adhesions per Surgery tomorrow.  At the same time, will coordinate with urology for retrograde eval and stent placeemnt to R ureter.    Surgery ordered to place  PICC and start TNA giving prolonged ileus  Electrolytes:  Na 133, other lytes okay.  Baseline alb = 3.2  CBGs < 150  GI ppx: Protonix IV daily  Nutritional Goals:  1525 - 2100 kCal, ~82 grams of protein per day Pending RD evaluation  Clinimix E5/20 at 70 ml/hr + Lipid 20% at 82m/hr will provide 84 grams of protein/day and 1958 kCal/day  Plan:  At 1800 today:   Start Clinimix E 5/20 at 40 ml/hr   20% fat emulsion at 10 ml/hr   Plan to advance as tolerated to a goal rate of 70 ml/hr.    TNA to contain standard multivitamins and trace elements only on MWF due to ongoing shortages  Reduce IVF to 85 ml/hr.  Add Sensitive SSI q8h .   TNA lab panels on Mondays & Thursdays.  Dietician consult - f/u recommendations.  PVanessa DurhamThi 08/09/2011,11:29 AM

## 2011-08-09 NOTE — Progress Notes (Signed)
INITIAL ADULT NUTRITION ASSESSMENT Date: 08/09/2011   Time: 12:00 PM Reason for Assessment: Consult - TNA  Food/Nutrition Related Hx: Pt with breast CA s/p left breast lumpectomy, on radiation. 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 Pt admitted with concern for blockage to ileostomy as pt with abdominal pain, distention, nausea, and minimal output from ileostomy. Pt was found to have high grade SBO. Pt developed neck and jaw pain the night of 4/15 and otolaryngologist was consulted. Met with pt who reports neck and jaw pain persists, but slightly better than yesterday. Pt reports eating 3 meals/day at home, mostly low fiber foods r/t ileostomy, and no recent changes in weight. Pt states she has been on TNA in the past after her hysterectomy in 1996 and she thinks the TNA caused her ileostomy not to work. Pt states the only thing that made it work was letting her eat by mouth for 24 hours. Pt states at home she would empty her ileostomy once a week. Pt denies any nausea. Noted plans to possibly have laparotomy with lysis of adhesions with retrograde pyelogram and double J stent placement for right sided hydronephrosis. Pt with NGT in place with light green drainage.   CT of neck on 08/08/11 showed: 1. No acute or focal lesion to explain the patients symptoms.  2. The salivary glands appear normal. There is no evidence for  sialoadenitis.  3. Moderate spondylosis of the cervical spine.  DG of abdomen on 08/08/11 showed: Stable small bowel obstruction pattern.  ASSESSMENT: Female 61 y.o.  Dx: Small bowel obstruction  Hx:  Past Medical History  Diagnosis Date  . Ulcerative colitis [phone]  . Pulmonary embolus 1971  . Phlebitis 1971  . Small bowel obstruction [id number]  . Small bowel perforation 1971/1996  . Large bowel perforation 1971/1996  . Scoliosis   . Torn rotator cuff 2005  . Sleep apnea 2009  . History of bone density study 2012  . Fatigue   . Cataract     Left Eye  . Hot  flashes   . 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 Hypertension   . Cancer     DCIS L breast  . H/O ulcerative colitis   . Breast cancer   . Arthritis     hands, lumbar spine, hips, right ankle  . S/P ileostomy    Related Meds:  Scheduled Meds:   . ciprofloxacin  400 mg Intravenous Q12H  . heparin subcutaneous  5,000 Units Subcutaneous Q8H  . insulin aspart  0-9 Units Subcutaneous Q8H  . pantoprazole (PROTONIX) IV  40 mg Intravenous QHS   Continuous Infusions:   . dextrose 5 % and 0.45 % NaCl with KCl 40 mEq/L 125 mL/hr at 08/09/11 0630  . dextrose 5 % and 0.45 % NaCl with KCl 40 mEq/L    . TPN (CLINIMIX) +/- additives     And  . fat emulsion     PRN Meds:.acetaminophen, acetaminophen, iohexol, morphine, ondansetron  Ht: 5' 4"  (162.6 cm)  Wt: 165 lb (74.844 kg)  Ideal Wt: 120 lb % Ideal Wt: 137  Usual Wt: 165 lb % Usual Wt: 100  Body mass index is 28.32 kg/(m^2).   Labs:  CMP     Component Value Date/Time   NA 133* 08/08/2011 1300   K 4.1 08/08/2011 1300   CL 98 08/08/2011 1300   CO2 26 08/08/2011 1300   GLUCOSE 137* 08/08/2011 1300  BUN 7 08/08/2011 1300   CREATININE 1.16* 08/08/2011 1300   CALCIUM 9.2 08/08/2011 1300   PROT 6.7 08/08/2011 1300   ALBUMIN 3.2* 08/08/2011 1300   AST 24 08/08/2011 1300   ALT 22 08/08/2011 1300   ALKPHOS 94 08/08/2011 1300   BILITOT 1.2 08/08/2011 1300   GFRNONAA 49* 08/08/2011 1300   GFRAA 57* 08/08/2011 1300    Intake/Output Summary (Last 24 hours) at 08/09/11 1233 Last data filed at 08/09/11 0919  Gross per 24 hour  Intake   5000 ml  Output   3500 ml  Net   1500 ml   NGT output - 285m total yesterday  Ileostomy - No output charted  Diet Order: NPO   IVF:    dextrose 5 % and 0.45 % NaCl with KCl 40 mEq/L Last Rate: 125 mL/hr at 08/09/11 0630  dextrose 5 % and 0.45 % NaCl with KCl 40 mEq/L   TPN (CLINIMIX) +/- additives   And   fat emulsion     Estimated  Nutritional Needs:   Kcal: 16568-1275Protein: 90-115g Fluid: 1.8-2.2L  NUTRITION DIAGNOSIS: -Inadequate oral intake (NI-2.1).  Status: Ongoing  RELATED TO: SBO  AS EVIDENCE BY: NPO  MONITORING/EVALUATION(Goals): TNA to meet >90% of estimated nutritional needs.  EDUCATION NEEDS: -No education needs identified at this time  INTERVENTION: TNA per pharmacy. Diet advancement per MD. Will monitor.   Dietitian #: [phone] DEast BendPer approved criteria  -Not Applicable    BGlory Rosebush4/17/2013, 12:00 PM

## 2011-08-09 NOTE — Progress Notes (Signed)
Patient ID: Deanna Burke, female   DOB: October 20, 1950, 61 y.o.   MRN: [id number]   Subjective: Patient reports continued fairly severe bilateral facial pain. Etiology of this remains unclear. She is having minimal abdominal complaints at this point but still has not had resolution of her partial small bowel obstruction. Surgery has been strongly considered for tomorrow pending ongoing assessment of her other complaints.  Objective: Vital signs in last 24 hours: Temp:  [98.4 F (36.9 C)] 98.4 F (36.9 C) (04/17 0630) Pulse Rate:  [78-84] 78  (04/17 0630) Resp:  [17-18] 18  (04/17 0630) BP: (106-139)/(60-74) 106/64 mmHg (04/17 0630) SpO2:  [96 %-99 %] 99 % (04/17 0630)  Intake/Output from previous day: 04/16 0701 - 04/17 0700 In: 5000 [I.V.:5000] Out: 3000 [Urine:2800; Emesis/NG output:200] Intake/Output this shift: Total I/O In: -  Out: 500 [Urine:500]  Physical Exam:  Constitutional: Vital signs reviewed. WD WN in NAD   Eyes: PERRL, No scleral icterus.   Cardiovascular: RRR Pulmonary/Chest: Normal effort Abdominal: Soft. No change Genitourinary: Not examined Extremities: No cyanosis or edema   Lab Results:  Basename 08/08/11 1300  HGB 11.4*  HCT 32.7*   BMET  Basename 08/08/11 1300  NA 133*  K 4.1  CL 98  CO2 26  GLUCOSE 137*  BUN 7  CREATININE 1.16*  CALCIUM 9.2   No results found for this basename: LABPT:3,INR:3 in the last 72 hours No results found for this basename: LABURIN:1 in the last 72 hours Results for orders placed during the hospital encounter of 08/03/11  URINE CULTURE     Status: Normal   Collection Time   08/03/11 10:17 PM      Component Value Range Status Comment   Specimen Description URINE, CLEAN CATCH   Final    Special Requests levaquin, doxycycline   Final    Culture  Setup Time [id number]   Final    Colony Count NO GROWTH   Final    Culture NO GROWTH   Final    Report Status 08/05/2011 FINAL   Final     Studies/Results: Ct  Soft Tissue Neck W Contrast  08/09/2011  *RADIOLOGY REPORT*  Clinical Data: Neck and face pain.  Evaluate for parotitis, mass, or abscess.  History of breast cancer.  CT NECK WITH CONTRAST  Technique:  Multidetector CT imaging of the neck was performed with intravenous contrast.  Contrast: 32m OMNIPAQUE IOHEXOL 300 MG/ML  SOLN  Comparison: None available.  Findings: Limited imaging of the brain is unremarkable.  The parotid glands are symmetric in size and enhancement.  There is no duct dilation.  The submandibular glands are also symmetric in size and density.  No duct dilation or stone is evident.  An NG tube is in place.  No significant mucosal or submucosal lesion is evident.  There is no significant cervical adenopathy.  The bone windows demonstrate to moderate to endplate and facet degenerative changes throughout the cervical spine.  This is most pronounced t C5-6 and C6-7.  The lung apices are clear.  IMPRESSION:  1.  No acute or focal lesion to explain the patients symptoms. 2.  The salivary glands appear normal.  There is no evidence for sialoadenitis. 3.  Moderate spondylosis of the cervical spine.  Original Report Authenticated By: CResa Miner MATTERN, M.D.   Dg Abd 2 Views  08/08/2011  *RADIOLOGY REPORT*  Clinical Data: Small bowel obstruction.  ABDOMEN - 2 VIEW  Comparison: 08/06/2011  Findings: NG tube remains stable position within the  stomach. Dilated small bowel loops are again noted within the mid abdomen, unchanged.  No free air organomegaly.  No acute bony abnormality. The left scoliosis and degenerative changes in the lumbar spine.  IMPRESSION: Stable small bowel obstruction pattern.  Original Report Authenticated By: Raelyn Number, M.D.    Assessment/Plan:   Urology service will be available to perform cystoscopy with retrograde pyelogram and double-J stent placement if surgery for her bowel obstruction is performed in the next day or so.   LOS: 6 days   Yoshi Mancillas  S 08/09/2011, 12:20 PM

## 2011-08-09 NOTE — Progress Notes (Signed)
[facility] Therapy Treatment Record Phone [phone]   Radiation Therapy was administered to Deanna Burke on: 08/09/2011  2:31 PM and was treatment # 11 out of a planned course of 30 treatments.

## 2011-08-09 NOTE — Progress Notes (Signed)
General Surgery [facility] - Attending  Patient seen and examined.  Persistent pain in face, neck.  CT negative.  ENT evaluating.  Small bowel obstruction persists.  No significant output from ileostomy.  PICC line to be placed and TNA to be initiated.  Plan OR tomorrow afternoon for ex lap with lysis of adhesions.  Urology to perform cystoscopy with retrograde and possible right stent placement - see Dr. Risa Grill note.  The risks and benefits of the procedure have been discussed at length with the patient.  The patient understands the proposed procedure, potential alternative treatments, and the course of recovery to be expected.  All of the patient's questions have been answered at this time.  The patient wishes to proceed with surgery.  Earnstine Regal, MD, [facility] Office: [phone]

## 2011-08-09 NOTE — Progress Notes (Signed)
Subjective: Neck and jaws still painful, better with ice, not as bad as yesterday.  Nothing thru colostomy, no gas, stool in it is old.  I don't think NG drainage properly recorded for yesterday.  Objective: Vital signs in last 24 hours: Temp:  [98.4 F (36.9 C)] 98.4 F (36.9 C) (04/17 0630) Pulse Rate:  [78-84] 78  (04/17 0630) Resp:  [17-18] 18  (04/17 0630) BP: (106-139)/(60-74) 106/64 mmHg (04/17 0630) SpO2:  [96 %-99 %] 99 % (04/17 0630) Last BM Date: 08/08/11 Afebrile, VSS, 200 recorded from NG yesterday, CT neck shows no acute changes/spondylosis of cervical spine Intake/Output from previous day: 04/16 0701 - 04/17 0700 In: 5000 [I.V.:5000] Out: 3000 [Urine:2800; Emesis/NG output:200] Intake/Output this shift: Total I/O In: -  Out: 500 [Urine:500]  General appearance: mild distress Neck: no adenopathy, no carotid bruit, no JVD, supple, symmetrical, trachea midline, thyroid not enlarged, symmetric, no tenderness/mass/nodules and still tender using ice for pain jaw and neck Resp: clear to auscultation bilaterally GI: few bowel sounds, not tender, no distension.  nothing new, no gas in her ostomy.  Lab Results:   Basename 08/08/11 1300  WBC 7.5  HGB 11.4*  HCT 32.7*  PLT 212    BMET  Basename 08/08/11 1300  NA 133*  K 4.1  CL 98  CO2 26  GLUCOSE 137*  BUN 7  CREATININE 1.16*  CALCIUM 9.2   PT/INR No results found for this basename: LABPROT:2,INR:2 in the last 72 hours   Lab 08/08/11 1300 08/03/11 1035  AST 24 28  ALT 22 25  ALKPHOS 94 99  BILITOT 1.2 1.2  PROT 6.7 8.0  ALBUMIN 3.2* 4.3     Lipase     Component Value Date/Time   LIPASE 44 08/03/2011 1035     Studies/Results: Ct Soft Tissue Neck W Contrast  08/09/2011  *RADIOLOGY REPORT*  Clinical Data: Neck and face pain.  Evaluate for parotitis, mass, or abscess.  History of breast cancer.  CT NECK WITH CONTRAST  Technique:  Multidetector CT imaging of the neck was performed with  intravenous contrast.  Contrast: 67m OMNIPAQUE IOHEXOL 300 MG/ML  SOLN  Comparison: None available.  Findings: Limited imaging of the brain is unremarkable.  The parotid glands are symmetric in size and enhancement.  There is no duct dilation.  The submandibular glands are also symmetric in size and density.  No duct dilation or stone is evident.  An NG tube is in place.  No significant mucosal or submucosal lesion is evident.  There is no significant cervical adenopathy.  The bone windows demonstrate to moderate to endplate and facet degenerative changes throughout the cervical spine.  This is most pronounced t C5-6 and C6-7.  The lung apices are clear.  IMPRESSION:  1.  No acute or focal lesion to explain the patients symptoms. 2.  The salivary glands appear normal.  There is no evidence for sialoadenitis. 3.  Moderate spondylosis of the cervical spine.  Original Report Authenticated By: CResa Miner MATTERN, M.D.   Dg Abd 2 Views  08/08/2011  *RADIOLOGY REPORT*  Clinical Data: Small bowel obstruction.  ABDOMEN - 2 VIEW  Comparison: 08/06/2011  Findings: NG tube remains stable position within the stomach. Dilated small bowel loops are again noted within the mid abdomen, unchanged.  No free air organomegaly.  No acute bony abnormality. The left scoliosis and degenerative changes in the lumbar spine.  IMPRESSION: Stable small bowel obstruction pattern.  Original Report Authenticated By: KRaelyn Number M.D.  Medications:    . ciprofloxacin  400 mg Intravenous Q12H  . heparin subcutaneous  5,000 Units Subcutaneous Q8H  . 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 ER:+, PR+, followed by Dr. Ninfa Linden, Currently having radiation  therapy.  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 Simeon Craft, continue NPO, NG.  i have talked with Dr. Risa Grill and he is aware we may take her to OR tomorrow.  Once we have a time he will help coordinate assist. Tomorrow will a week since admission, and she now a week NPO and with SBO.  I will have a PICC placed and start TNA.     LOS: 6 days    Deanna Burke 08/09/2011

## 2011-08-10 ENCOUNTER — Encounter (HOSPITAL_COMMUNITY): Admission: EM | Disposition: A | Discharge status: Home / Self Care | Payer: Self-pay | Source: Home / Self Care

## 2011-08-10 ENCOUNTER — Ambulatory Visit: Payer: PRIVATE HEALTH INSURANCE

## 2011-08-10 ENCOUNTER — Ambulatory Visit
Admission: RE | Admit: 2011-08-10 | Discharge: 2011-08-10 | Disposition: A | Discharge status: Home / Self Care | Payer: PRIVATE HEALTH INSURANCE | Source: Ambulatory Visit | Attending: Radiation Oncology | Admitting: Radiation Oncology

## 2011-08-10 LAB — DIFFERENTIAL
Basophils Absolute: 0 10*3/uL (ref 0.0–0.1)
Basophils Relative: 0 % (ref 0–1)
Eosinophils Absolute: 0.1 10*3/uL (ref 0.0–0.7)
Neutro Abs: 4.3 10*3/uL (ref 1.7–7.7)
Neutrophils Relative %: 81 % — ABNORMAL HIGH (ref 43–77)

## 2011-08-10 LAB — CBC
HCT: 32.7 % — ABNORMAL LOW (ref 36.0–46.0)
Hemoglobin: 10.9 g/dL — ABNORMAL LOW (ref 12.0–15.0)
MCH: 29.5 pg (ref 26.0–34.0)
MCV: 88.6 fL (ref 78.0–100.0)
RBC: 3.69 MIL/uL — ABNORMAL LOW (ref 3.87–5.11)
WBC: 5.4 10*3/uL (ref 4.0–10.5)

## 2011-08-10 LAB — COMPREHENSIVE METABOLIC PANEL
ALT: 19 U/L (ref 0–35)
AST: 19 U/L (ref 0–37)
Alkaline Phosphatase: 109 U/L (ref 39–117)
CO2: 25 mEq/L (ref 19–32)
Chloride: 100 mEq/L (ref 96–112)
GFR calc Af Amer: 60 mL/min — ABNORMAL LOW (ref >90)
GFR calc non Af Amer: 52 mL/min — ABNORMAL LOW (ref >90)
Glucose, Bld: 120 mg/dL — ABNORMAL HIGH (ref 70–99)
Sodium: 134 mEq/L — ABNORMAL LOW (ref 135–145)
Total Bilirubin: 1 mg/dL (ref 0.3–1.2)

## 2011-08-10 SURGERY — LAPAROTOMY, EXPLORATORY
Anesthesia: General

## 2011-08-10 MED ORDER — FAT EMULSION 20 % IV EMUL
240.0000 mL | INTRAVENOUS | Status: AC
  Administered 2011-08-10: 240 mL via INTRAVENOUS
  Filled 2011-08-10: qty 250

## 2011-08-10 MED ORDER — KCL IN DEXTROSE-NACL 40-5-0.45 MEQ/L-%-% IV SOLN
INTRAVENOUS | Status: AC
  Administered 2011-08-11: 12:00:00 via INTRAVENOUS
  Filled 2011-08-10 (×2): qty 1000

## 2011-08-10 MED ORDER — CLINIMIX E/DEXTROSE (5/20) 5 % IV SOLN
INTRAVENOUS | Status: AC
  Administered 2011-08-10: 18:00:00 via INTRAVENOUS
  Filled 2011-08-10: qty 1320

## 2011-08-10 SURGICAL SUPPLY — 35 items
APPLICATOR COTTON TIP 6IN STRL (MISCELLANEOUS) ×1 IMPLANT
BLADE EXTENDED COATED 6.5IN (ELECTRODE) IMPLANT
BLADE HEX COATED 2.75 (ELECTRODE) ×1 IMPLANT
CANISTER SUCTION 2500CC (MISCELLANEOUS) ×1 IMPLANT
CLOTH BEACON ORANGE TIMEOUT ST (SAFETY) ×1 IMPLANT
COVER MAYO STAND STRL (DRAPES) IMPLANT
DRAPE LAPAROSCOPIC ABDOMINAL (DRAPES) ×1 IMPLANT
DRAPE WARM FLUID 44X44 (DRAPE) IMPLANT
ELECT REM PT RETURN 9FT ADLT (ELECTROSURGICAL)
ELECTRODE REM PT RTRN 9FT ADLT (ELECTROSURGICAL) ×1 IMPLANT
GLOVE BIOGEL PI IND STRL 7.0 (GLOVE) ×1 IMPLANT
GLOVE BIOGEL PI INDICATOR 7.0 (GLOVE)
GLOVE SURG ORTHO 8.0 STRL STRW (GLOVE) ×1 IMPLANT
GOWN STRL NON-REIN LRG LVL3 (GOWN DISPOSABLE) ×1 IMPLANT
GOWN STRL REIN XL XLG (GOWN DISPOSABLE) ×2 IMPLANT
KIT BASIN OR (CUSTOM PROCEDURE TRAY) ×1 IMPLANT
NS IRRIG 1000ML POUR BTL (IV SOLUTION) ×1 IMPLANT
PACK GENERAL/GYN (CUSTOM PROCEDURE TRAY) ×1 IMPLANT
SPONGE GAUZE 4X4 12PLY (GAUZE/BANDAGES/DRESSINGS) ×1 IMPLANT
SPONGE LAP 18X18 X RAY DECT (DISPOSABLE) IMPLANT
STAPLER VISISTAT 35W (STAPLE) ×1 IMPLANT
SUCTION POOLE TIP (SUCTIONS) IMPLANT
SUT NOV 1 T60/GS (SUTURE) IMPLANT
SUT SILK 2 0 (SUTURE)
SUT SILK 2 0 SH CR/8 (SUTURE) IMPLANT
SUT SILK 2-0 18XBRD TIE 12 (SUTURE) IMPLANT
SUT SILK 3 0 (SUTURE)
SUT SILK 3 0 SH CR/8 (SUTURE) IMPLANT
SUT SILK 3-0 18XBRD TIE 12 (SUTURE) IMPLANT
SUT VICRYL 2 0 18  UND BR (SUTURE)
SUT VICRYL 2 0 18 UND BR (SUTURE) IMPLANT
TOWEL OR 17X26 10 PK STRL BLUE (TOWEL DISPOSABLE) ×2 IMPLANT
TRAY FOLEY CATH 14FRSI W/METER (CATHETERS) IMPLANT
WATER STERILE IRR 1500ML POUR (IV SOLUTION) ×1 IMPLANT
YANKAUER SUCT BULB TIP NO VENT (SUCTIONS) IMPLANT

## 2011-08-10 NOTE — Progress Notes (Signed)
Called surgeon on call to inform that pt wants to talk with MD prior to having surgery to discuss. Md stated that surgeon performing surgery will be on floor this am to talk with pt prior to surgery. Pt informed. Will continue to monitor.

## 2011-08-10 NOTE — Progress Notes (Signed)
PARENTERAL NUTRITION CONSULT NOTE - INITIAL  Pharmacy Consult for TNA Indication: Prolonged Ileus  Allergies  Allergen Reactions  . Demerol Nausea And Vomiting  . Dilaudid (Hydromorphone Hcl) Nausea And Vomiting  . Stadol (Butorphanol Tartrate) Nausea And Vomiting  . Amoxicillin Rash  . Keflex Rash  . Penicillins Rash    Patient Measurements: Height: 5' 4"  (162.6 cm) Weight: 165 lb (74.844 kg) IBW/kg (Calculated) : 54.7  Adjusted Body Weight: 61 kg  Vital Signs: Temp: 97.9 F (36.6 C) (04/18 0550) Temp src: Oral (04/18 0550) BP: 138/61 mmHg (04/18 0550) Pulse Rate: 85  (04/18 0550) Intake/Output from previous day: 04/17 0701 - 04/18 0700 In: 1389.7 [I.V.:841.5; NG/GT:90; TPN:458.2] Out: 3750 [Urine:3600; Emesis/NG output:150] Intake/Output from this shift:    Labs:  [facility] 08/10/11 0640 08/08/11 1300  WBC 5.4 7.5  HGB 10.9* 11.4*  HCT 32.7* 32.7*  PLT 265 212  APTT -- --  INR -- --     Basename 08/10/11 0640 08/08/11 1300  NA 134* 133*  K 3.7 4.1  CL 100 98  CO2 25 26  GLUCOSE 120* 137*  BUN 12 7  CREATININE 1.12* 1.16*  LABCREA -- --  CREAT24HRUR -- --  CALCIUM 9.5 9.2  MG 1.9 --  PHOS 2.6 --  PROT 6.8 6.7  ALBUMIN 2.9* 3.2*  AST 19 24  ALT 19 22  ALKPHOS 109 94  BILITOT 1.0 1.2  BILIDIR -- --  IBILI -- --  PREALBUMIN -- --  TRIG -- --  CHOLHDL -- --  CHOL -- --   Estimated Creatinine Clearance: 49.8 ml/min (by C-G formula based on Cr of 1.16).    Basename 08/10/11 0547 08/09/11 2156 08/09/11 1527  GLUCAP 126* 128* 99    Insulin Requirements in the past 24 hours:  2 units  Current Nutrition:  NPO x 1 week. CL diet started this am.   IVF:  D5 1/2 NS with 40 KCL at 85 ml/hr.  Assessment:  61 YOF with h/o breast cancer, colectomy/ileostomy 40 years ago with h/o of SBOs. CT scan this admit revealed SBO. Repeat Abx X-ray shows persistent SBO with little improvement. Was planned for laparotomy and lysis of adhesions per surgery  today but pt wishes to delay surgical intervention.   TNA started yesterday given prolonged ileus  Electrolytes:  Na still low, other lytes okay.  Baseline alb = 3.2, 2.9 today  CBGs < 150  GI ppx: Protonix IV daily  Nutritional Goals:  Estimated Nutritional Needs per RD:  Kcal: 8916-9450  Protein: 90-115g  Fluid: 1.8-2.2L  Clinimix E5/20 at 70 ml/hr + Lipid 20% at 55m/hr will provide 84 grams of protein/day and 1958 kCal/day  Plan:   Increase Clinimix E 5/20 at 55 ml/hr, advance as tolerated to a goal rate of 70 ml/hr.   Continue 20% fat emulsion at 10 ml/hr    TNA to contain standard multivitamins and trace elements only on MWF due to ongoing shortages  Reduce IVF to 60 ml/hr.  Continue sensitive SSI q8h .   TNA lab panels on Mondays & Thursdays.  CMarcell AngerPharmD  [phone]/18/2013 7:14 AM

## 2011-08-10 NOTE — Progress Notes (Signed)
Patient ID: Deanna Burke, female   DOB: 1951/04/03, 61 y.o.   MRN: [id number]  General Surgery - [facility] - Progress Note  HD# [id number]   Subjective: Patient with output from ileostomy overnight - moderate flatus and thick succus.  Abdomen more comfortable.  Facial pain improved.  Patient wishes to delay surgical intervention and try clear liquids today.  Objective: Vital signs in last 24 hours: Temp:  [97.9 F (36.6 C)-98.6 F (37 C)] 97.9 F (36.6 C) (04/18 0550) Pulse Rate:  [78-85] 85  (04/18 0550) Resp:  [16-18] 18  (04/18 0550) BP: (121-138)/(56-62) 138/61 mmHg (04/18 0550) SpO2:  [98 %-99 %] 98 % (04/18 0550) Last BM Date: 08/08/11  Intake/Output from previous day: 04/17 0701 - 04/18 0700 In: 1389.7 [I.V.:841.5; NG/GT:90; TPN:458.2] Out: 3750 [Urine:3600; Emesis/NG output:150]  Exam: HEENT - clear, not icteric Neck - soft Chest - clear bilaterally Cor - RRR, no murmur Abd - much softer, no tenderness; few BS; stoma with 1/2 bag thick succus, flatus Ext - no significant edema Neuro - grossly intact, no focal deficits  Lab Results:   Basename 08/10/11 0640 08/08/11 1300  WBC 5.4 7.5  HGB 10.9* 11.4*  HCT 32.7* 32.7*  PLT 265 212     Basename 08/08/11 1300  NA 133*  K 4.1  CL 98  CO2 26  GLUCOSE 137*  BUN 7  CREATININE 1.16*  CALCIUM 9.2    Studies/Results: Ct Soft Tissue Neck W Contrast  08/09/2011  *RADIOLOGY REPORT*  Clinical Data: Neck and face pain.  Evaluate for parotitis, mass, or abscess.  History of breast cancer.  CT NECK WITH CONTRAST  Technique:  Multidetector CT imaging of the neck was performed with intravenous contrast.  Contrast: 34m OMNIPAQUE IOHEXOL 300 MG/ML  SOLN  Comparison: None available.  Findings: Limited imaging of the brain is unremarkable.  The parotid glands are symmetric in size and enhancement.  There is no duct dilation.  The submandibular glands are also symmetric in size and density.  No duct dilation  or stone is evident.  An NG tube is in place.  No significant mucosal or submucosal lesion is evident.  There is no significant cervical adenopathy.  The bone windows demonstrate to moderate to endplate and facet degenerative changes throughout the cervical spine.  This is most pronounced t C5-6 and C6-7.  The lung apices are clear.  IMPRESSION:  1.  No acute or focal lesion to explain the patients symptoms. 2.  The salivary glands appear normal.  There is no evidence for sialoadenitis. 3.  Moderate spondylosis of the cervical spine.  Original Report Authenticated By: CResa Miner MATTERN, M.D.   Dg Abd 2 Views  08/08/2011  *RADIOLOGY REPORT*  Clinical Data: Small bowel obstruction.  ABDOMEN - 2 VIEW  Comparison: 08/06/2011  Findings: NG tube remains stable position within the stomach. Dilated small bowel loops are again noted within the mid abdomen, unchanged.  No free air organomegaly.  No acute bony abnormality. The left scoliosis and degenerative changes in the lumbar spine.  IMPRESSION: Stable small bowel obstruction pattern.  Original Report Authenticated By: KRaelyn Number M.D.    Assessment / Plan: 1.  SBO  - interval improvement with output from stoma, benign abd exam  - will clamp NG tube and allow CL diet  - OOB, ambulate  - cancel laparotomy and cystoscopy for today 2.  Breast Ca  - ongoing XRT 3.  Facial/jaw pain  - improving - ? etiology  Earnstine Regal, MD, [facility] Office: [phone]  08/10/2011

## 2011-08-10 NOTE — Progress Notes (Signed)
Pt informed RN that she did not want to have surgery this am if it was deemed unnecessary. Pt states she has gas in stool in her colostomy bag and wants to talk to MD prior to performing surgery. Will call MD and inform.

## 2011-08-10 NOTE — Progress Notes (Signed)
[place] Therapy Treatment Record Phone [phone]   Radiation Therapy was administered to Deanna Burke on: 08/10/2011  2:42 PM and was treatment # 12 out of a planned course of 30 treatments.

## 2011-08-11 ENCOUNTER — Ambulatory Visit
Admission: RE | Admit: 2011-08-11 | Discharge: 2011-08-11 | Disposition: A | Discharge status: Home / Self Care | Payer: PRIVATE HEALTH INSURANCE | Source: Ambulatory Visit | Attending: Radiation Oncology | Admitting: Radiation Oncology

## 2011-08-11 LAB — BASIC METABOLIC PANEL
CO2: 25 mEq/L (ref 19–32)
Chloride: 101 mEq/L (ref 96–112)
GFR calc non Af Amer: 53 mL/min — ABNORMAL LOW (ref >90)
Glucose, Bld: 125 mg/dL — ABNORMAL HIGH (ref 70–99)
Potassium: 3.9 mEq/L (ref 3.5–5.1)
Sodium: 136 mEq/L (ref 135–145)

## 2011-08-11 LAB — GLUCOSE, CAPILLARY: Glucose-Capillary: 152 mg/dL — ABNORMAL HIGH (ref 70–99)

## 2011-08-11 MED ORDER — FAT EMULSION 20 % IV EMUL
240.0000 mL | INTRAVENOUS | Status: DC
  Administered 2011-08-11: 240 mL via INTRAVENOUS
  Filled 2011-08-11: qty 250

## 2011-08-11 MED ORDER — KCL IN DEXTROSE-NACL 40-5-0.45 MEQ/L-%-% IV SOLN
INTRAVENOUS | Status: DC
  Filled 2011-08-11 (×3): qty 1000

## 2011-08-11 MED ORDER — TRACE MINERALS CR-CU-MN-SE-ZN 10-1000-500-60 MCG/ML IV SOLN
INTRAVENOUS | Status: DC
  Administered 2011-08-11: 17:00:00 via INTRAVENOUS
  Filled 2011-08-11: qty 2000

## 2011-08-11 NOTE — Progress Notes (Signed)
PARENTERAL NUTRITION CONSULT NOTE - Follow Up  Pharmacy Consult for TNA Indication: Prolonged Ileus, SBO  Allergies  Allergen Reactions  . Demerol Nausea And Vomiting  . Dilaudid (Hydromorphone Hcl) Nausea And Vomiting  . Stadol (Butorphanol Tartrate) Nausea And Vomiting  . Amoxicillin Rash  . Keflex Rash  . Penicillins Rash    Patient Measurements: Height: 5' 4"  (162.6 cm) Weight: 165 lb (74.844 kg) IBW/kg (Calculated) : 54.7  Adjusted Body Weight: 61 kg  Vital Signs: Temp: 98.1 F (36.7 C) (04/19 0600) Temp src: Oral (04/19 0600) BP: 143/74 mmHg (04/19 0600) Pulse Rate: 74  (04/19 0600)  Intake/Output from previous day: 04/18 0701 - 04/19 0700 In: 3350 [P.O.:720; I.V.:1570; IV Piggyback:200; TPN:780] Out: 1500 [Urine:1200; Emesis/NG output:300] 4/18 I/O net +1.9L  Labs:  [facility] 08/10/11 0640 08/08/11 1300  WBC 5.4 7.5  HGB 10.9* 11.4*  HCT 32.7* 32.7*  PLT 265 212  APTT -- --  INR -- --     Basename 08/11/11 0543 08/10/11 0640 08/08/11 1300  NA 136 134* 133*  K 3.9 3.7 4.1  CL 101 100 98  CO2 25 25 26   GLUCOSE 125* 120* 137*  BUN 17 12 7   CREATININE 1.09 1.12* 1.16*  LABCREA -- -- --  CREAT24HRUR -- -- --  CALCIUM 9.5 9.5 9.2  MG -- 1.9 --  PHOS -- 2.6 --  PROT -- 6.8 6.7  ALBUMIN -- 2.9* 3.2*  AST -- 19 24  ALT -- 19 22  ALKPHOS -- 109 94  BILITOT -- 1.0 1.2  BILIDIR -- -- --  IBILI -- -- --  PREALBUMIN -- 12.5* --  TRIG -- 103 --  CHOLHDL -- -- --  CHOL -- 176 --   Estimated Creatinine Clearance: 53 ml/min (by C-G formula based on Cr of 1.09).    Basename 08/10/11 1428 08/10/11 0547 08/09/11 2156  GLUCAP 126* 126* 128*    Insulin Requirements in the past 24 hours: 2 units  Current Nutrition:  CL diet (NPO x1 week)  IVF:  D5 1/2 NS with 40 KCL at 60 ml/hr.  Assessment:  89 YOF with h/o breast cancer, colectomy/ileostomy 40 years ago with h/o of SBOs. CT scan this admit revealed SBO. Repeat Abx X-ray shows persistent SBO with  little improvement. Was planned for laparotomy and lysis of adhesions per surgery today but pt wishes to delay surgical intervention.   D#3 TNA   Electrolytes:  Within normal limits  Baseline alb = 3.2, 2.9 (4/18)  CBGs < 150  GI ppx: Protonix IV daily  Nutritional Goals:  Estimated Nutritional Needs per RD 4/17:   Kcal: 0932-6712  Protein: 90-115g  Fluid: 1.8-2.2L  Clinimix E5/20 at 70 ml/hr + Lipid 20% at 63m/hr will provide 84 grams of protein/day and 1958 kCal/day  Plan:   Increase Clinimix E 5/20 to goal rate of 70 ml/hr  Continue 20% fat emulsion at 10 ml/hr    TNA to contain standard multivitamins and trace elements only on MWF due to ongoing shortages  Reduce IVF to 45 ml/hr.  Continue sensitive SSI q8h .   TNA lab panels on Mondays & Thursdays.   CGretta ArabPharmD, BCPS Pager [phone]/19/2013 7:40 AM

## 2011-08-11 NOTE — Progress Notes (Signed)
1 Day Post-Op  Subjective: Some stool and gas in ostomy, she feels better.  Says this is how she does after an obstruction  Objective: Vital signs in last 24 hours: Temp:  [98.1 F (36.7 C)-98.2 F (36.8 C)] 98.1 F (36.7 C) (04/19 0600) Pulse Rate:  [74-94] 74  (04/19 0600) Resp:  [18] 18  (04/19 0600) BP: (132-143)/(70-74) 143/74 mmHg (04/19 0600) SpO2:  [98 %] 98 % (04/19 0600) Last BM Date: 08/11/11  300/NG, 720 /PO, afebrile, VSS  Intake/Output from previous day: 04/18 0701 - 04/19 0700 In: 3350 [P.O.:720; I.V.:1570; IV Piggyback:200; TPN:780] Out: 2900 [Urine:2600; Emesis/NG output:300] Intake/Output this shift: Total I/O In: -  Out: 500 [Urine:500]  Jaw pain resolved. General appearance: alert, cooperative and no distress Resp: clear to auscultation bilaterally GI: soft, non-tender; bowel sounds normal; no masses,  no organomegaly and small amout of stool in ostomy, some gas. Pouch emptied about 3 hours ago. Lab Results:   Basename 08/10/11 0640 08/08/11 1300  WBC 5.4 7.5  HGB 10.9* 11.4*  HCT 32.7* 32.7*  PLT 265 212    BMET  Basename 08/11/11 0543 08/10/11 0640  NA 136 134*  K 3.9 3.7  CL 101 100  CO2 25 25  GLUCOSE 125* 120*  BUN 17 12  CREATININE 1.09 1.12*  CALCIUM 9.5 9.5   PT/INR No results found for this basename: LABPROT:2,INR:2 in the last 72 hours   Lab 08/10/11 0640 08/08/11 1300  AST 19 24  ALT 19 22  ALKPHOS 109 94  BILITOT 1.0 1.2  PROT 6.8 6.7  ALBUMIN 2.9* 3.2*     Lipase     Component Value Date/Time   LIPASE 44 08/03/2011 1035     Studies/Results: No results found.  Medications:    . ciprofloxacin  400 mg Intravenous Q12H  . insulin aspart  0-9 Units Subcutaneous Q8H  . metronidazole  500 mg Intravenous 60 min Pre-Op  . 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 ER:+, PR+, followed by Dr. Ninfa Linden, Currently having radiation therapy.  Hx: Total Colectomy, Ileostomy 1973 for ulcerative colitis,Exploratory laparotomy with lysis of adhesions 1978 & 1990,Vaginoplasty, Appendectomy  Pulmonary embolus  Sleep apnea  Hypertension  Mild Renal Insuffiency  New neck and jaw pain, it seems very localize The same on both sides,   PLan:  She would like to go to full liquids, and keep NG for now to be sure she is still progressing.    LOS: 8 days    Deanna Burke 08/11/2011

## 2011-08-11 NOTE — Progress Notes (Signed)
Patient currently  resting in bed,  Alert and oriented X4, RA.  No signs of respiratory or cardiac distress.  Patient has NG tube clamped and place in RT nare.  Ostomy bag is intact other wise no other skin issues noted.  Patient has tolerated medications thus far during the shift.  She denies any other verbal complaints or request.  IV fluids, TPN and lipids are infusing with no complications.  PICC Line is intact.  Will continue to monitor patient during the course of the shift.  No other verbal complaints at this time.  Call bell in reach, side rails are in an upright position.

## 2011-08-11 NOTE — Progress Notes (Signed)
General Surgery [facility] - Attending  Patient seen and examined.  Continued output from ileostomy.  No nausea or emesis.  Will remove NG tube and advance to full liquid diet.  If fails, will need laparotomy.  Earnstine Regal, MD, [facility] Office: [phone]

## 2011-08-11 NOTE — Progress Notes (Signed)
[facility] Therapy Treatment Record Phone [phone]   Radiation Therapy was administered to Deanna Burke on: 08/11/2011  10:26 AM and was treatment # 13 out of a planned course of 30 treatments.

## 2011-08-11 NOTE — Progress Notes (Signed)
UR complete 

## 2011-08-12 MED ORDER — TRACE MINERALS CR-CU-MN-SE-ZN 10-1000-500-60 MCG/ML IV SOLN: INTRAVENOUS | Status: AC

## 2011-08-12 MED ORDER — FAT EMULSION 20 % IV EMUL: 240.0000 mL | INTRAVENOUS | Status: AC

## 2011-08-12 NOTE — Progress Notes (Signed)
Patient resting in bed, no signs of cardiac or respiratory distress.  IV fluids infusing, no complications noted.  NG tube is secure and clamped.  Will continue to monitor patient during the course of the shift.  Bed brakes are engaged and side rails are in an upright position.

## 2011-08-12 NOTE — Progress Notes (Signed)
2 Days Post-Op  Subjective: Tolerating full liquids.  Ostomy working normally, denies pain and nausea.  Feels "back to normal"  Objective: Vital signs in last 24 hours: Temp:  [97.9 F (36.6 C)-99 F (37.2 C)] 99 F (37.2 C) (04/20 0500) Pulse Rate:  [85-95] 88  (04/20 0500) Resp:  [18] 18  (04/20 0500) BP: (133-146)/(75-80) 146/75 mmHg (04/20 0500) SpO2:  [97 %-99 %] 99 % (04/20 0500) Last BM Date: 08/11/11  Intake/Output from previous day: 04/19 0701 - 04/20 0700 In: 3144.6 [P.O.:420; I.V.:642.3; IV Piggyback:260; TPN:1822.3] Out: 1600 [Urine:1600] Intake/Output this shift:    General appearance: alert, cooperative and no distress Resp: nonlabored, RA Cardio: normal rate, regular rhythm GI: soft, nt, nd, ostomy functioning  Lab Results:   [facility] 08/10/11 0640  WBC 5.4  HGB 10.9*  HCT 32.7*  PLT 265   BMET  Basename 08/11/11 0543 08/10/11 0640  NA 136 134*  K 3.9 3.7  CL 101 100  CO2 25 25  GLUCOSE 125* 120*  BUN 17 12  CREATININE 1.09 1.12*  CALCIUM 9.5 9.5   PT/INR No results found for this basename: LABPROT:2,INR:2 in the last 72 hours ABG No results found for this basename: PHART:2,PCO2:2,PO2:2,HCO3:2 in the last 72 hours  Studies/Results: No results found.  Anti-infectives: Anti-infectives     Start     Dose/Rate Route Frequency Ordered Stop   08/09/11 1916   metroNIDAZOLE (FLAGYL) IVPB 500 mg        500 mg 100 mL/hr over 60 Minutes Intravenous 60 min pre-op 08/09/11 1916     08/03/11 2200   ciprofloxacin (CIPRO) IVPB 400 mg        400 mg 200 mL/hr over 60 Minutes Intravenous Every 12 hours 08/03/11 2054            Assessment/Plan: s/p Procedure(s) (LRB): EXPLORATORY LAPAROTOMY (N/A) LYSIS OF ADHESION (N/A) CYSTOSCOPY WITH RETROGRADE PYELOGRAM, URETEROSCOPY AND STENT PLACEMENT (Right) Advance diet, remove NG, likely home tomorrow if tolerating diet.  LOS: 9 days    Deanna Burke DAVID 08/12/2011

## 2011-08-12 NOTE — Progress Notes (Signed)
CC: Rt hydronephrosis, incidental on imaging for bowel obstruction  Subjective: 1- Rt Hydronephrosis - No flank pain, dysuria, fevers. Completely incidental.  Today Vaishali is having clinical resolution of fer bowel obstruction with conservative measures and will likely not need general surgery this admission. She again notes h/o recurrent uncomplicated cystitis without pneumaturia or fecaluria.  Objective: Vital signs in last 24 hours: Temp:  [97.9 F (36.6 C)-99 F (37.2 C)] 99 F (37.2 C) (04/20 0500) Pulse Rate:  [85-95] 88  (04/20 0500) Resp:  [18] 18  (04/20 0500) BP: (133-146)/(75-80) 146/75 mmHg (04/20 0500) SpO2:  [97 %-99 %] 99 % (04/20 0500) Last BM Date: 08/11/11  Intake/Output from previous day: 04/19 0701 - 04/20 0700 In: 3144.6 [P.O.:420; I.V.:642.3; IV Piggyback:260; TPN:1822.3] Out: 1600 [Urine:1600] Intake/Output this shift:    General appearance: alert, cooperative and NGT in place with minimal output Head: Normocephalic, without obvious abnormality, atraumatic Eyes: conjunctivae/corneas clear. PERRL, EOM's intact. Fundi benign. Neck: no adenopathy, no carotid bruit, no JVD, supple, symmetrical, trachea midline and thyroid not enlarged, symmetric, no tenderness/mass/nodules Back: symmetric, no curvature. ROM normal. No CVA tenderness., No CVAT Resp: clear to auscultation bilaterally GI: soft, non-tender; bowel sounds normal; no masses,  no organomegaly and enterostomy patent with gas and stool in bag Neurologic: Alert and oriented X 3, normal strength and tone. Normal symmetric reflexes. Normal coordination and gait Incision/Wound:  Lab Results:   Basename 08/10/11 0640  WBC 5.4  HGB 10.9*  HCT 32.7*  PLT 265   BMET  Basename 08/11/11 0543 08/10/11 0640  NA 136 134*  K 3.9 3.7  CL 101 100  CO2 25 25  GLUCOSE 125* 120*  BUN 17 12  CREATININE 1.09 1.12*  CALCIUM 9.5 9.5   PT/INR No results found for this basename: LABPROT:2,INR:2 in the last  72 hours ABG No results found for this basename: PHART:2,PCO2:2,PO2:2,HCO3:2 in the last 72 hours  Studies/Results: No results found.  Anti-infectives: Anti-infectives     Start     Dose/Rate Route Frequency Ordered Stop   08/09/11 1916   metroNIDAZOLE (FLAGYL) IVPB 500 mg  Status:  Discontinued        500 mg 100 mL/hr over 60 Minutes Intravenous 60 min pre-op 08/09/11 1916 08/12/11 0747   08/03/11 2200   ciprofloxacin (CIPRO) IVPB 400 mg  Status:  Discontinued        400 mg 200 mL/hr over 60 Minutes Intravenous Every 12 hours 08/03/11 2054 08/12/11 0747          Assessment/Plan: 1 - Rt Hydronephrosis - by imaging may represent UPJ obstruction or possibly stricture /fistual due to inflammatory bowel disease. GFR normal and asymptomatic presently. As pt will not require general surgery operative intervention this admission, we recommend further w/u as outpatient.  Please call with any concerns. [phone]  Alexis Frock 08/12/2011

## 2011-08-13 MED ORDER — PANTOPRAZOLE SODIUM 40 MG PO TBEC: 40.0000 mg | DELAYED_RELEASE_TABLET | Freq: Every day | ORAL | Status: DC

## 2011-08-13 NOTE — Progress Notes (Signed)
D/C PICC line from RUA.  Pressure applied and guaze pressure dressing applied to site. No ADN.

## 2011-08-13 NOTE — Progress Notes (Signed)
3 Days Post-Op  Subjective: No complaints. No pain or nausea and ostomy working well.  Objective: Vital signs in last 24 hours: Temp:  [98.4 F (36.9 C)-98.5 F (36.9 C)] 98.4 F (36.9 C) (04/21 0551) Pulse Rate:  [82-113] 82  (04/21 0551) Resp:  [18-20] 20  (04/21 0551) BP: (124-149)/(55-75) 149/75 mmHg (04/21 0551) SpO2:  [99 %-100 %] 99 % (04/21 0551) Last BM Date: 08/12/11  Intake/Output from previous day: 04/20 0701 - 04/21 0700 In: 1296.7 [P.O.:240; I.V.:480; TPN:576.7] Out: 2200 [Urine:2200] Intake/Output this shift:    General appearance: alert, cooperative and no distress GI: soft, non-tender; bowel sounds normal; no masses,  no organomegaly and good ostomy output  Lab Results:  No results found for this basename: WBC:2,HGB:2,HCT:2,PLT:2 in the last 72 hours BMET  [facility] 08/11/11 0543  NA 136  K 3.9  CL 101  CO2 25  GLUCOSE 125*  BUN 17  CREATININE 1.09  CALCIUM 9.5   PT/INR No results found for this basename: LABPROT:2,INR:2 in the last 72 hours ABG No results found for this basename: PHART:2,PCO2:2,PO2:2,HCO3:2 in the last 72 hours  Studies/Results: No results found.  Anti-infectives: Anti-infectives     Start     Dose/Rate Route Frequency Ordered Stop   08/09/11 1916   metroNIDAZOLE (FLAGYL) IVPB 500 mg  Status:  Discontinued        500 mg 100 mL/hr over 60 Minutes Intravenous 60 min pre-op 08/09/11 1916 08/12/11 0747   08/03/11 2200   ciprofloxacin (CIPRO) IVPB 400 mg  Status:  Discontinued        400 mg 200 mL/hr over 60 Minutes Intravenous Every 12 hours 08/03/11 2054 08/12/11 0747          Assessment/Plan: s/p Procedure(s) (LRB): EXPLORATORY LAPAROTOMY (N/A) LYSIS OF ADHESION (N/A) CYSTOSCOPY WITH RETROGRADE PYELOGRAM, URETEROSCOPY AND STENT PLACEMENT (Right) Discharge  LOS: 10 days    [place], [place] 08/13/2011

## 2011-08-14 ENCOUNTER — Ambulatory Visit
Admission: RE | Admit: 2011-08-14 | Discharge: 2011-08-14 | Disposition: A | Discharge status: Home / Self Care | Payer: PRIVATE HEALTH INSURANCE | Source: Ambulatory Visit | Attending: Radiation Oncology | Admitting: Radiation Oncology

## 2011-08-14 ENCOUNTER — Encounter: Payer: Self-pay | Admitting: Radiation Oncology

## 2011-08-14 DIAGNOSIS — C50419 Malignant neoplasm of upper-outer quadrant of unspecified female breast: Secondary | ICD-10-CM

## 2011-08-14 LAB — GLUCOSE, CAPILLARY
Glucose-Capillary: 130 mg/dL — ABNORMAL HIGH (ref 70–99)
Glucose-Capillary: 94 mg/dL (ref 70–99)

## 2011-08-14 NOTE — Progress Notes (Signed)
Pt alert and orientedx 3, left breast slight erythema, using radiaplex gel, low back pain, none in breast 11:50 AM

## 2011-08-14 NOTE — Progress Notes (Signed)
   Weekly Management Note, left breast DCIS Current Dose: 2800 cGy  Projected Dose:6000  cGy   Narrative:  The patient presents for routine under treatment assessment.  CBCT/MVCT images/Port film x-rays were reviewed.  The chart was checked. She was recently discharged from the hospital. Fortunately, she did not need to undergo surgery for her small bowel obstruction. This has resolved. Her skin irritation is minimal  Physical Findings: Weight:  . Vitals were not taken today. She is in no acute distress. She has erythema but no desquamation over her left breast.  Impression:  The patient is tolerating radiotherapy.  Plan:  Continue radiotherapy as planned.

## 2011-08-15 ENCOUNTER — Ambulatory Visit
Admission: RE | Admit: 2011-08-15 | Discharge: 2011-08-15 | Disposition: A | Discharge status: Home / Self Care | Payer: PRIVATE HEALTH INSURANCE | Source: Ambulatory Visit | Attending: Radiation Oncology | Admitting: Radiation Oncology

## 2011-08-15 NOTE — Discharge Summary (Signed)
Physician Discharge Summary  Patient ID: Deanna Burke MRN: [id number] DOB/AGE: 1950/05/07 61 y.o.  Admit date: 08/03/2011 Discharge date: 08/15/2011  Admission Diagnoses:Small bowel obstruction, likely secondary to adhesions. Agree with NG tube, NPO, IV hydration.  2. Recurrent UTI's - consider fistula to right ureter - ? Etiology - will start empiric IV abx. Will need urology consultation.  3. Dehydration - IVF  4. 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 ER:+, PR+, followed by Dr. Ninfa Linden, Currently having radiation therapy.  Hx: Total Colectomy, Ileostomy 1973 for ulcerative colitis,Exploratory laparotomy with lysis of adhesions 1978 & 1990,Vaginoplasty, Appendectomy  Pulmonary embolus  Sleep apnea  Hypertension  Mild Renal Insuffiency  New neck and jaw pain, it seems very localize The same on both sides,   Principal Problem:  *Small bowel obstruction Active Problems:  Hydroureter on right   PROCEDURES: None  Hospital Course: 61 yo WF with less than 24 hour hx of abdominal distension, abdominal pain, nausea, and minimal output from end ileostomy. Pt with hx of total proctocolectomy at [facility] in 1973 for ulcerative colitis. Previous SBO with laparotomy 20 yrs ago. Recent recurrent UTI's - appointment to see Urology consult on 4/22. Last PM developed abd pain, distension, and has had no output from ileostomy since 7 PM last night. Seen at [place] shows dilated SB loops with air fluid levels consistent with SBO. CT abdomen confirms SBO with likely transition point in LLQ due to adhesions. Also noted right hydroureter with communication of oral GI constrast into ureter - ? Fistula. Patient transferred to [facility] ER  for admission. Patient currently undergoing treatment for left breast cancer - surgeon Dr. Ninfa Linden. Presently undergoing XRT Pt was admitted and placed on bowel rest, rehydration and NG drainage.She was seen by Dr Risa Grill for Right sided hydronephrosis, with possible fistula.  Plans were made to follow as Outpatient if her SBO resolved.  If she needed surgery they would go to OR with [place] to do cystoscopy and retrograde pyelogram/stent placement as needed.  We thought she was not going to open up and started coordinating surgery with the Urology service with plans for 4/17.  She started to open up at that time.  And eventually opened up and did not require surgery.  We had started TNA because we didn't feel she was opening up. We also had her Radiation RX for breast cancer continued. By 4/21 she was doing well and discharged home by DR. Layton.  Condition on D/C:  Improved.   Disposition: 01-Home or Self Care  Discharge Orders    Future Appointments: Provider: Department: Dept Phone: Center:   08/16/2011 11:20 AM [facility] [phone] None   08/17/2011 11:20 AM [facility] [phone] None   08/18/2011 11:20 AM [facility] [phone] None   08/21/2011 4:20 PM [facility] [phone] None   08/22/2011 4:20 PM [facility] [phone] None   08/23/2011 4:20 PM [facility] [phone] None   08/24/2011 4:20 PM [facility] [phone] None   08/25/2011 4:20 PM [facility] [phone] None   08/28/2011 4:20 PM [facility] [phone] None  08/29/2011 4:20 PM [facility] [phone] None   08/30/2011 4:20 PM [facility] [phone] None   08/31/2011 4:20 PM [facility] [phone] None    09/01/2011 4:20 PM [facility] [phone] None   09/04/2011 4:20 PM [facility] [phone] None   09/05/2011 4:20 PM [facility] [phone] None   12/14/2011 3:30 PM Chauncey Cruel, MD [facility] [phone] None     Future Orders Please Complete By Expires   Diet - low sodium heart healthy      Increase activity slowly      Discharge instructions      Comments:   Call your Urologist for follow up as soon as possible.   Call MD for:  persistant nausea and vomiting      Call MD for:  severe uncontrolled pain      PICC line removal        Medication List  As of 08/15/2011  1:40 PM   TAKE these medications         aspirin 325 MG tablet   Take 325 mg by mouth daily.      Bismuth Subgallate 200 MG Chew   Chew by mouth.      calcium carbonate 600 MG Tabs   Commonly known as: OS-CAL   Take 600 mg by mouth 2 (two) times daily with a meal.      doxycycline 100 MG DR capsule   Commonly known as: DORYX   Take 100 mg by mouth 2 (two) times daily.      ferrous fumarate 325 (106 FE) MG Tabs   Commonly known as: HEMOCYTE - 106 mg FE   Take 1 tablet by mouth.      fish oil-omega-3 fatty acids 1000 MG capsule   Take 1 g by mouth daily.      hyaluronate sodium Gel   Apply 1 application topically 2 (two) times daily as needed. To apply cream  topically  to left breast area after radiation treatment and bedtime bid      levofloxacin 500 MG tablet   Commonly known as: LEVAQUIN   Take 500 mg by mouth daily.      multivitamin with minerals tablet   Take 1 tablet by mouth daily.      triamterene-hydrochlorothiazide 37.5-25 MG per capsule   Commonly known as: DYAZIDE   Take 1 capsule by mouth every morning.      vitamin C 500 MG tablet   Commonly known as: ASCORBIC ACID   Take 1,000 mg by mouth daily.      vitamin E 400 UNIT capsule   Take 400 Units by mouth daily.           Follow-up  Information    Follow up with [name], MD.   Contact information:   [address] [facility] [place] [address]          Signed: Earnstine Regal 08/15/2011, 1:40 PM

## 2011-08-16 ENCOUNTER — Encounter: Payer: Self-pay | Admitting: Radiation Oncology

## 2011-08-16 ENCOUNTER — Ambulatory Visit
Admission: RE | Admit: 2011-08-16 | Discharge: 2011-08-16 | Disposition: A | Discharge status: Home / Self Care | Payer: PRIVATE HEALTH INSURANCE | Source: Ambulatory Visit | Attending: Radiation Oncology | Admitting: Radiation Oncology

## 2011-08-17 ENCOUNTER — Ambulatory Visit
Admission: RE | Admit: 2011-08-17 | Discharge: 2011-08-17 | Disposition: A | Discharge status: Home / Self Care | Payer: PRIVATE HEALTH INSURANCE | Source: Ambulatory Visit | Attending: Radiation Oncology | Admitting: Radiation Oncology

## 2011-08-18 ENCOUNTER — Ambulatory Visit
Admission: RE | Admit: 2011-08-18 | Discharge: 2011-08-18 | Disposition: A | Discharge status: Home / Self Care | Payer: PRIVATE HEALTH INSURANCE | Source: Ambulatory Visit | Attending: Radiation Oncology | Admitting: Radiation Oncology

## 2011-08-21 ENCOUNTER — Ambulatory Visit
Admission: RE | Admit: 2011-08-21 | Discharge: 2011-08-21 | Disposition: A | Discharge status: Home / Self Care | Payer: PRIVATE HEALTH INSURANCE | Source: Ambulatory Visit | Attending: Radiation Oncology | Admitting: Radiation Oncology

## 2011-08-21 ENCOUNTER — Encounter: Payer: Self-pay | Admitting: Radiation Oncology

## 2011-08-21 VITALS — BP 109/65 | HR 108 | Temp 97.5°F | Wt 163.0 lb

## 2011-08-21 DIAGNOSIS — C50419 Malignant neoplasm of upper-outer quadrant of unspecified female breast: Secondary | ICD-10-CM

## 2011-08-21 NOTE — Progress Notes (Signed)
Discharged from hospital Sunday 1 week ago.. Reports fatigue. Vss.  Ileostomy patent.  Left upper inner region of left breast with rash-like appearance with itching.

## 2011-08-21 NOTE — Progress Notes (Addendum)
   Weekly Management Note, left breast DCIS Current Dose:   3800 cGy  Projected Dose: 6000  cGy   Narrative:  The patient presents for routine under treatment assessment.  CBCT/MVCT images/Port film x-rays were reviewed.  The chart was checked. SHe is doing well. She has some itching in the upper inner quadrant of her breast.  Physical Findings: Weight: 163 lb (73.936 kg). The left breast is notable for radiation dermatitis in the upper inner quadrant. The rest of the breast is diffusely erythematous. Skin is intact.  Impression:  The patient is tolerating radiotherapy.  Plan:  Continue radiotherapy as planned. The patient was given a tube of Biafine for her skin.

## 2011-08-22 ENCOUNTER — Ambulatory Visit
Admission: RE | Admit: 2011-08-22 | Discharge: 2011-08-22 | Disposition: A | Discharge status: Home / Self Care | Payer: PRIVATE HEALTH INSURANCE | Source: Ambulatory Visit | Attending: Radiation Oncology | Admitting: Radiation Oncology

## 2011-08-23 ENCOUNTER — Ambulatory Visit
Admission: RE | Admit: 2011-08-23 | Discharge: 2011-08-23 | Disposition: A | Discharge status: Home / Self Care | Payer: PRIVATE HEALTH INSURANCE | Source: Ambulatory Visit | Attending: Radiation Oncology | Admitting: Radiation Oncology

## 2011-08-24 ENCOUNTER — Ambulatory Visit: Payer: PRIVATE HEALTH INSURANCE

## 2011-08-25 ENCOUNTER — Ambulatory Visit
Admission: RE | Admit: 2011-08-25 | Discharge: 2011-08-25 | Disposition: A | Discharge status: Home / Self Care | Payer: PRIVATE HEALTH INSURANCE | Source: Ambulatory Visit | Attending: Radiation Oncology | Admitting: Radiation Oncology

## 2011-08-28 ENCOUNTER — Ambulatory Visit
Admission: RE | Admit: 2011-08-28 | Discharge: 2011-08-28 | Disposition: A | Discharge status: Home / Self Care | Payer: PRIVATE HEALTH INSURANCE | Source: Ambulatory Visit | Attending: Radiation Oncology | Admitting: Radiation Oncology

## 2011-08-28 ENCOUNTER — Encounter: Payer: Self-pay | Admitting: Radiation Oncology

## 2011-08-28 DIAGNOSIS — C50419 Malignant neoplasm of upper-outer quadrant of unspecified female breast: Secondary | ICD-10-CM

## 2011-08-28 NOTE — Progress Notes (Signed)
HERE TODAY FOR PUT OF LEFT BREAST.   SKIN RED UNDER BREAST WITH SOME DRY DESQUAMATION, SORENESS AND BURNING PRESENT.  USING BIAFINE

## 2011-08-28 NOTE — Progress Notes (Signed)
   Weekly Management Note: left breast DCIS Current Dose:   4600cGy  Projected Dose:  6000cGy   Narrative:  The patient presents for routine under treatment assessment.  CBCT/MVCT images/Port film x-rays were reviewed.  The chart was checked. She is doing relatively well. Her main issue is that she needs a stent placed in her ureter and she has a " swollen kidney".  She is using Biafine over her skin. She has noticed a little bit of peeling at the inframammary fold   Physical Findings: Weight:  . Her weight is 162 pounds. She is in no acute distress. Her left breast is erythematous. There is a small amount of early moist desquamation at the inframammary fold, measuring about 3 mm in size. The left axilla is hyperpigmented  Impression:  The patient is tolerating radiotherapy.  Plan:  Continue radiotherapy as planned. I recommended a little bit of Neosporin at the inframammary fold and otherwise continue Biafine elsewhere

## 2011-08-29 ENCOUNTER — Ambulatory Visit
Admission: RE | Admit: 2011-08-29 | Discharge: 2011-08-29 | Disposition: A | Discharge status: Home / Self Care | Payer: PRIVATE HEALTH INSURANCE | Source: Ambulatory Visit | Attending: Radiation Oncology | Admitting: Radiation Oncology

## 2011-08-30 ENCOUNTER — Encounter: Payer: Self-pay | Admitting: Radiation Oncology

## 2011-08-30 ENCOUNTER — Ambulatory Visit
Admission: RE | Admit: 2011-08-30 | Discharge: 2011-08-30 | Disposition: A | Discharge status: Home / Self Care | Payer: PRIVATE HEALTH INSURANCE | Source: Ambulatory Visit | Attending: Radiation Oncology | Admitting: Radiation Oncology

## 2011-08-30 NOTE — Progress Notes (Signed)
NARRATIVE: The patient was laid in the correct position on the treatment table for simulation verification. Portal imaging was obtained for her boost treatments and I verified the fields and MLCs to be accurate. The patient tolerated the procedure well.

## 2011-08-31 ENCOUNTER — Ambulatory Visit
Admission: RE | Admit: 2011-08-31 | Discharge: 2011-08-31 | Disposition: A | Discharge status: Home / Self Care | Payer: PRIVATE HEALTH INSURANCE | Source: Ambulatory Visit | Attending: Radiation Oncology | Admitting: Radiation Oncology

## 2011-09-01 ENCOUNTER — Ambulatory Visit: Payer: PRIVATE HEALTH INSURANCE

## 2011-09-01 ENCOUNTER — Ambulatory Visit
Admission: RE | Admit: 2011-09-01 | Discharge: 2011-09-01 | Disposition: A | Discharge status: Home / Self Care | Payer: PRIVATE HEALTH INSURANCE | Source: Ambulatory Visit | Attending: Radiation Oncology | Admitting: Radiation Oncology

## 2011-09-04 ENCOUNTER — Ambulatory Visit
Admission: RE | Admit: 2011-09-04 | Discharge: 2011-09-04 | Disposition: A | Discharge status: Home / Self Care | Payer: PRIVATE HEALTH INSURANCE | Source: Ambulatory Visit | Attending: Radiation Oncology | Admitting: Radiation Oncology

## 2011-09-04 ENCOUNTER — Ambulatory Visit: Payer: PRIVATE HEALTH INSURANCE

## 2011-09-04 ENCOUNTER — Encounter: Payer: Self-pay | Admitting: Radiation Oncology

## 2011-09-04 VITALS — Wt 163.8 lb

## 2011-09-04 DIAGNOSIS — C50419 Malignant neoplasm of upper-outer quadrant of unspecified female breast: Secondary | ICD-10-CM

## 2011-09-04 NOTE — Progress Notes (Signed)
HERE TODAY FOR PUT OF LEFT BREAST.  HAS SOME LOWER BACK PAIN WHEN GETTING OFF THE TX TABLE DUE TO A KIDNEY PROBLEM OTHERWISE NO C/O PAIN.  SKIN DARK BROWN ON BREAST AND RED UNDER BREAST WITH SOME DRY DESQUAMATION.  USING BIAFINE

## 2011-09-04 NOTE — Progress Notes (Signed)
   Weekly Management Note: Left Breast DCIS Current Dose:   5600 cGy  Projected Dose:  6000 cGy   Narrative:  The patient presents for routine under treatment assessment.  CBCT/MVCT images/Port film x-rays were reviewed.  The chart was checked. She is doing well. She thinks her skin is improving   Physical Findings: Weight: 163 lb 12.8 oz (74.299 kg). She is diffuse erythema throughout the left breast and axilla. There is some dry desquamation and skin peeling at the left inframammary fold.  Impression:  The patient is tolerating radiotherapy.  Plan:  Continue boost treatments / radiotherapy as planned. She has a prescription for her anti-estrogen to start this after completion of radiotherapy. I told her to continue the Biafine over her skin and when she runs out of this she can transition to the radiaplex that she still has on hand. Routine followup in one month after completion of radiotherapy.

## 2011-09-05 ENCOUNTER — Ambulatory Visit
Admission: RE | Admit: 2011-09-05 | Discharge: 2011-09-05 | Disposition: A | Discharge status: Home / Self Care | Payer: PRIVATE HEALTH INSURANCE | Source: Ambulatory Visit | Attending: Radiation Oncology | Admitting: Radiation Oncology

## 2011-09-05 ENCOUNTER — Other Ambulatory Visit (HOSPITAL_COMMUNITY): Payer: Self-pay | Admitting: Urology

## 2011-09-05 ENCOUNTER — Ambulatory Visit: Payer: PRIVATE HEALTH INSURANCE

## 2011-09-05 DIAGNOSIS — N135 Crossing vessel and stricture of ureter without hydronephrosis: Secondary | ICD-10-CM

## 2011-09-06 ENCOUNTER — Ambulatory Visit
Admission: RE | Admit: 2011-09-06 | Discharge: 2011-09-06 | Disposition: A | Discharge status: Home / Self Care | Payer: PRIVATE HEALTH INSURANCE | Source: Ambulatory Visit | Attending: Radiation Oncology | Admitting: Radiation Oncology

## 2011-09-06 ENCOUNTER — Ambulatory Visit: Payer: PRIVATE HEALTH INSURANCE

## 2011-09-06 ENCOUNTER — Encounter: Payer: Self-pay | Admitting: Radiation Oncology

## 2011-09-07 ENCOUNTER — Encounter (HOSPITAL_COMMUNITY): Payer: Self-pay

## 2011-09-07 ENCOUNTER — Encounter (HOSPITAL_COMMUNITY)
Admission: RE | Admit: 2011-09-07 | Discharge: 2011-09-07 | Disposition: A | Discharge status: Home / Self Care | Payer: PRIVATE HEALTH INSURANCE | Source: Ambulatory Visit | Attending: Urology | Admitting: Urology

## 2011-09-07 DIAGNOSIS — N135 Crossing vessel and stricture of ureter without hydronephrosis: Secondary | ICD-10-CM

## 2011-09-07 DIAGNOSIS — N2889 Other specified disorders of kidney and ureter: Secondary | ICD-10-CM | POA: Insufficient documentation

## 2011-09-07 MED ORDER — TECHNETIUM TC 99M MERTIATIDE
15.7000 | Freq: Once | INTRAVENOUS | Status: AC | PRN
Start: 2011-09-07 — End: 2011-09-07
  Administered 2011-09-07: 15.7 via INTRAVENOUS

## 2011-09-07 MED ORDER — FUROSEMIDE 10 MG/ML IJ SOLN
40.0000 mg | INTRAMUSCULAR | Status: DC
  Filled 2011-09-07: qty 4

## 2011-09-20 NOTE — Progress Notes (Signed)
Simulation treatment planning note. Diagnosis: breast cancer  The patient's CT images were used to plan her lumpectomy boost. I plan to treat her boost with 2 photon fields using MLCs for custom blocks.  I looked at her plan, and approved it. 1000 cGy 5 fractions will be prescribed to the lumpectomy cavity.

## 2011-09-20 NOTE — Progress Notes (Signed)
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 tolerated her treatments very well without any excessive side effects. She had more difficulty with issues related to other comorbidities, including a small bowel obstruction. At the end of treatment, the patient was also pursuing consultation with urology regarding genito-urinary issues.                        PLAN: Routine followup in one month. Patient instructed to call if questions or worsening complaints in interim.

## 2011-10-19 ENCOUNTER — Encounter: Payer: Self-pay | Admitting: Radiation Oncology

## 2011-10-19 DIAGNOSIS — C801 Malignant (primary) neoplasm, unspecified: Secondary | ICD-10-CM | POA: Insufficient documentation

## 2011-10-19 DIAGNOSIS — C50919 Malignant neoplasm of unspecified site of unspecified female breast: Secondary | ICD-10-CM | POA: Insufficient documentation

## 2011-10-20 ENCOUNTER — Ambulatory Visit
Admission: RE | Admit: 2011-10-20 | Discharge: 2011-10-20 | Disposition: A | Discharge status: Home / Self Care | Payer: PRIVATE HEALTH INSURANCE | Source: Ambulatory Visit | Attending: Radiation Oncology | Admitting: Radiation Oncology

## 2011-10-20 ENCOUNTER — Encounter: Payer: Self-pay | Admitting: Radiation Oncology

## 2011-10-20 VITALS — BP 121/70 | HR 86 | Temp 98.3°F | Wt 165.0 lb

## 2011-10-20 DIAGNOSIS — C50419 Malignant neoplasm of upper-outer quadrant of unspecified female breast: Secondary | ICD-10-CM

## 2011-10-20 HISTORY — DX: Personal history of irradiation: Z92.3

## 2011-10-20 NOTE — Progress Notes (Signed)
  Radiation Oncology         [phone]) [phone] ________________________________  Name: Deanna Burke MRN: [id number]  Date: 10/20/2011  DOB: 1951/02/17  Follow-Up Visit Note  Diagnosis:  ER/PR positive DCIS of the left breast, multifocal  Interval Since Last Radiation:  The patient completed radiotherapy on 09/06/2011. She received 5000 cGy in 25 fractions the left breast followed by left breast boost of 1000 cGy in 5 fractions.  Narrative:  The patient returns today for routine follow-up.  Doing very well. Taking tamoxifen with resultant hot flashes. She has met with urology again and has no plans for any surgery in the near future. She is completing Macrodantin for recurrent generative urinary issues.                           ALLERGIES:  is allergic to demerol; dilaudid; stadol; amoxicillin; cephalexin; and penicillins.  Meds: Current Outpatient Prescriptions  Medication Sig Dispense Refill  . aspirin 325 MG tablet Take 325 mg by mouth daily.      . Bismuth Subgallate 200 MG CHEW Chew by mouth.      . calcium carbonate (OS-CAL) 600 MG TABS Take 600 mg by mouth 2 (two) times daily with a meal.      . ferrous fumarate (HEMOCYTE - 106 MG FE) 325 (106 FE) MG TABS Take 1 tablet by mouth.      . fish oil-omega-3 fatty acids 1000 MG capsule Take 1 g by mouth daily.      . hyaluronate sodium (RADIAPLEXRX) GEL Apply 1 application topically 2 (two) times daily as needed. To apply cream  topically  to left breast area after radiation treatment and bedtime bid      . levofloxacin (LEVAQUIN) 500 MG tablet Take 500 mg by mouth daily.      . Multiple Vitamins-Minerals (MULTIVITAMIN WITH MINERALS) tablet Take 1 tablet by mouth daily.      [name] triamterene-hydrochlorothiazide (DYAZIDE) 37.5-25 MG per capsule Take 1 capsule by mouth every morning.      . vitamin C (ASCORBIC ACID) 500 MG tablet Take 1,000 mg by mouth daily.      . vitamin E 400 UNIT capsule Take 400 Units by mouth daily.        Physical  Findings: The patient is in no acute distress. Patient is alert and oriented.  weight is 165 lb (74.844 kg). Her temperature is 98.3 F (36.8 C). Her blood pressure is 121/70 and her pulse is 86. . The patient's skin has healed nicely over her left breast. No residual desquamation.  Lab Findings: Lab Results  Component Value Date   WBC 5.4 08/10/2011   HGB 10.9* 08/10/2011   HCT 32.7* 08/10/2011   MCV 88.6 08/10/2011   PLT 265 08/10/2011    Radiographic Findings: No results found.  Impression:  The patient is recovering from the effects of radiation.    Plan: I told the patient that she can followup on an as-needed basis. I encouraged her to call my office if she has any issues in the future. She feels comfortable with this plan. She will continue tamoxifen and following with medical oncology.  -----------------------------------  Eppie Gibson, MD

## 2011-10-20 NOTE — Progress Notes (Signed)
Fu Today.  No voiced concerns.

## 2011-12-13 ENCOUNTER — Ambulatory Visit (INDEPENDENT_AMBULATORY_CARE_PROVIDER_SITE_OTHER): Payer: PRIVATE HEALTH INSURANCE | Admitting: Surgery

## 2011-12-13 ENCOUNTER — Encounter (INDEPENDENT_AMBULATORY_CARE_PROVIDER_SITE_OTHER): Payer: Self-pay | Admitting: Surgery

## 2011-12-13 VITALS — BP 130/68 | HR 86 | Temp 98.7°F | Ht 64.0 in | Wt 164.8 lb

## 2011-12-13 DIAGNOSIS — Z86000 Personal history of in-situ neoplasm of breast: Secondary | ICD-10-CM

## 2011-12-13 DIAGNOSIS — Z853 Personal history of malignant neoplasm of breast: Secondary | ICD-10-CM

## 2011-12-13 NOTE — Progress Notes (Signed)
Subjective:     Patient ID: Deanna Burke, female   DOB: [birth year], 61 y.o.   MRN: [id number]  HPI  She is here for long-term followup of her left breast ductal carcinoma in situ. She has finished radiation and is on tamoxifen. She has no complaints. She did have an admission for bowel obstruction but this resolved without need for surgery. She has a significant past  surgical history on her abdomen. She has no problems regarding her breast. Review of Systems     Objective:   Physical Exam On exam, her left breast incision is well healed. There are no palpable masses in the breast. There is no axillary adenopathy    Assessment:     Long-term followup left breast DCIS    Plan:     She will continue self examinations. She will get her mammograms November. I will see her back in one year unless there is a problem.

## 2011-12-14 ENCOUNTER — Ambulatory Visit (HOSPITAL_BASED_OUTPATIENT_CLINIC_OR_DEPARTMENT_OTHER): Payer: PRIVATE HEALTH INSURANCE | Admitting: Oncology

## 2011-12-14 VITALS — BP 115/73 | HR 97 | Temp 98.0°F | Resp 18 | Ht 64.0 in | Wt 166.6 lb

## 2011-12-14 DIAGNOSIS — D0510 Intraductal carcinoma in situ of unspecified breast: Secondary | ICD-10-CM

## 2011-12-14 DIAGNOSIS — N959 Unspecified menopausal and perimenopausal disorder: Secondary | ICD-10-CM

## 2011-12-14 DIAGNOSIS — D0590 Unspecified type of carcinoma in situ of unspecified breast: Secondary | ICD-10-CM

## 2011-12-14 DIAGNOSIS — N898 Other specified noninflammatory disorders of vagina: Secondary | ICD-10-CM

## 2011-12-14 DIAGNOSIS — Z170 Estrogen receptor positive status [ER+]: Secondary | ICD-10-CM

## 2011-12-14 MED ORDER — ESTRADIOL 25 MCG VA TABS: 25.0000 ug | ORAL_TABLET | VAGINAL | Status: DC

## 2011-12-14 MED ORDER — GABAPENTIN 300 MG PO CAPS: 300.0000 mg | ORAL_CAPSULE | Freq: Every day | ORAL | Status: DC

## 2011-12-14 NOTE — Progress Notes (Signed)
Deanna Burke  DOB: 1951-02-25  MR#: [id number]  CSN#: [id number]    History of present illness:   Deanna Burke is a 61 year old [place] woman with a new diagnosis of breast cancer.  She had routine screening mammography 03/22/2011 at [facility]. This showed new microcalcifications in the left breast. She was recalled for additional views December 14, and these showed the calcifications to be pleomorphic and linear. Stereotactic biopsy was performed 04/19/2011, and showed [phone] intermediate to high-grade ductal carcinoma in situ, estrogen receptor 100% positive, progesterone receptor 50% positive.  With this information the patient was referred for bilateral breast MRI, performed 04/27/2011. This showed no suspicious findings on the right. On the left there was 11 mm lobulated mass 5 cm from the prior biopsy clip. This second mass was biopsied today, but results are not yet available.  Interval history: [name] returns today for followup of her breast cancer. The interval history has been generally unremarkable, except that she has been found to have right hydronephrosis. She's also had repeated urinary tract infections. She is working with [name] great be on these questions.  ROS: She tells me urinary tract infections have been almost constant of this year. She was finally started on suppressive Macrodantin and did well with that, but within a few days of going off the medication she had another urinary tract infection. She was started on Cipro for this and she will be seeing Dr. [name] began of mid-September. Aside from these issues she's having moderate hot flashes from her tamoxifen. Vaginal dryness is moderate. A detailed review of systems is otherwise noncontributory.  Past medical history:      Past Medical History  Diagnosis Date  . Ulcerative colitis [phone]  . Pulmonary embolus 1971  . Phlebitis 1971  . Small bowel obstruction [id number]  . Small bowel  perforation 1971/1996  . Large bowel perforation 1971/1996  . Scoliosis   . Torn rotator cuff 2005  . Sleep apnea 2009  . History of bone density study 2012  . Fatigue   . Cataract     Left Eye  . Hot flashes   . 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 Hypertension   . Cancer     DCIS L breast  . H/O ulcerative colitis   . Breast cancer   . Arthritis     hands, lumbar spine, hips, right ankle  . S/P ileostomy   . S/P radiation therapy 07/24/11 - 09/06/11    Left Breast/ 5000 cGy in 25 Fractions with Boost of 1000 cGy in 5 Fractions  the patient underwent a total colectomy with ileostomy placement 1973 for her ulcerative colitis. She has done very well since.  Past surgical history:      Past Surgical History  Procedure Date  . Colectomy 1973  . Ileostomy 1973  . Vaginoplasty 1975  . Exploratory laparotomy 1978/1990    with lysis of adhesions  . Eye surgery 05/08/2011    left cataract removal   . Appendectomy   . Rotator cuff repair 2006    Right  . Breast surgery 05/03/11    LEFT BREAST NEEDLE CORE BIOPSY- DCIS  . 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  ER:+, PR+,   . Abdominal hysterectomy 1996    partial  status post appendectomy, status post hysterectomy with bilateral salpingo-oophorectomy.  Family history:     Family History  Problem Relation Age of Onset  . Breast cancer Maternal Grandmother   . Stomach cancer Maternal Grandfather   . Cancer Paternal Grandmother     stomach  . Cancer Other     Breast  the patient's father died at the age of 31 from congestive heart failure. The patient's mother is alive at age 66. She had one brothers no sisters. There is no history of breast or ovarian cancer in the immediate family.  Gynecologic history:menarche age 46, underwent hysterectomy and bilateral  salpingo-oophorectomy in 1996. She took hormone replacement for 16 years, stopping only at the time of this diagnosis. She is GX P0.    Social history:   She is a retired Arts development officer Investment banker, corporate). She is divorced. She lives by herself, with no pets. Her good friend, [name] is present today.    ADVANCED DIRECTIVES: no  Health maintenance:       History  Substance Use Topics  . Smoking status: Never Smoker   . Smokeless tobacco: Not on file  . Alcohol Use: No      Colonoscopy: status post total colectomy for ulcerative colitis  PAP: November 2012  Bone density: normal 2007  Cholesterol: about 200, with a high HDL  Allergies:     Allergies  Allergen Reactions  . Demerol Nausea And Vomiting  . Dilaudid (Hydromorphone Hcl) Nausea And Vomiting  . Stadol (Butorphanol Tartrate) Nausea And Vomiting  . Amoxicillin Rash  . Cephalexin Rash  . Penicillins Rash    Medications:      Current Outpatient Prescriptions  Medication Sig Dispense Refill  . aspirin 325 MG tablet Take 325 mg by mouth daily.      . Bismuth Subgallate 200 MG CHEW Chew by mouth.      . calcium carbonate (OS-CAL) 600 MG TABS Take 600 mg by mouth 2 (two) times daily with a meal.      . ciprofloxacin (CIPRO) 500 MG tablet Take 500 mg by mouth 2 (two) times daily.      . ferrous fumarate (HEMOCYTE - 106 MG FE) 325 (106 FE) MG TABS Take 1 tablet by mouth.      . fish oil-omega-3 fatty acids 1000 MG capsule Take 1 g by mouth daily.      . Multiple Vitamins-Minerals (MULTIVITAMIN WITH MINERALS) tablet Take 1 tablet by mouth daily.      . tamoxifen (NOLVADEX) 10 MG tablet Take 20 mg by mouth daily.       [name] triamterene-hydrochlorothiazide (DYAZIDE) 37.5-25 MG per capsule Take 1 capsule by mouth every morning.      . vitamin C (ASCORBIC ACID) 500 MG tablet Take 1,000 mg by mouth daily.      . vitamin E 400 UNIT capsule Take 400 Units by mouth daily.        Physical exam:  Middle-aged white woman in no acute  distress    Filed Vitals:   12/14/11 1500  BP: 115/73  Pulse: 97  Temp: 98 F (36.7 C)  Resp: 18     Body mass index is 28.60 kg/(m^2).  ECOG PS: 1  Sclerae unicteric Oropharynx clear No peripheral adenopathy Lungs no rales or rhonchi Heart regular rate and rhythm Abd bowel sounds positive. Ileostomy in place MSK no focal spinal tenderness, no peripheral edema Neuro: nonfocal Breasts: right breast no suspicious findings. Left breast, status post lumpectomy. No evidence of local recurrence   Lab results:  CBC No results found for this basename: WBC:5,HGB:5,HCT:5,PLT:5,MCV:5,MCH:5,MCHC:5,RDW:5,NEUTRABS:5,LYMPHSABS:5,MONOABS:5,EOSABS:5,BASOSABS:5,BANDABS:5,BANDSABD:5 in the last 168 hours  Chemistries  No results found for this basename: NA:5,K:5,CL:5,CO2:5,GLUCOSE:5,BUN:5,CREATININE:5,CALCIUM:5,MG:5 in the last 168 hours Studies:   Mr Breast Bilateral W Wo Contrast  04/27/2011  *RADIOLOGY REPORT*  Clinical Data: new diagnosis of DCIS left breast upper outer quadrant  BUN and creatinine were obtained on site at [facility] at [address] Results:  BUN 25 mg/dL,  Creatinine 1.1 mg/dL.  BILATERAL BREAST MRI WITH AND WITHOUT CONTRAST  Technique: Multiplanar, multisequence MR images of both breasts were obtained prior to and following the intravenous administration of ml of .  Three dimensional images were evaluated at the independent DynaCad workstation.  Comparison:  mammograms dated 03/22/11 and subsequent diagnostic and biopsy-related images  Findings: There is mild background parenchymal enhancement bilaterally.  There no suspicious findings on the right.  There is a normal subcentimeter intramammary lymph node on the right in the 11:oo position at the middle third depth.  On the left, there is a marker clip in the 2:00 position at the middle third depth, and this is associated with an anticipated core needle biopsy tract leading to the skin.  5cm posterior, medial, and  inferior to the clip is an 11 x 37m lobulated mass showing moderate initial enhancement and plateau type kinetics.  There is a normal intramammary lymph node about 1.5cm inferolateral to this.  IMPRESSION: Focal enhancing mass in the left breast.  BI-RADS CATEGORY 4:  Suspicious abnormality - biopsy should be considered.  RECOMMENDATION:  MRI-guided biopsy of the left breast mass.  THREE-DIMENSIONAL MR IMAGE RENDERING ON INDEPENDENT WORKSTATION:  Three-dimensional MR images were rendered by post-processing of the original MR data on an independent workstation.  The three- dimensional MR images were interpreted, and findings were reported in the accompanying complete MRI report for this study.  Original Report Authenticated By: [id number]  Assessment: 61year old [place] status post left lumpectomy 05/31/2011, for ductal carcinoma in situ, involving 2 sites, both estrogen and progesterone receptor positive, high-grade, with negative margins. Completed adjuvant radiation may 2013, at which point she started tamoxifen.  Plan:  If her vaginal dryness is felt to be contributing to the urinary tract infection issue, I am very comfortable with her using Vagifem suppositories or similar agents to relieve this problem, so long as she continues on tamoxifen.. In my experience it would be unusual for patient to have essentially constant urinary infections solely from this cause. At any rate I went ahead and wrote her for Vagifem suppositories, asking her to use them 3 times a week initially until the vaginal dryness issue is essentially resolved, then taper as tolerated. I also wrote her for gabapentin, to take at bedtime, to see that helps with the hot flashes problems. She will continue to follow with Dr. GRisa Grillto resolution of the urinary infection problem. Otherwise she will return to see [place] in one year. She knows to call for any problems that may develop before the next visit here.

## 2011-12-15 ENCOUNTER — Telehealth: Payer: Self-pay | Admitting: Oncology

## 2011-12-15 NOTE — Telephone Encounter (Signed)
lmonvm advising the pt of her feb 2014 appt

## 2012-02-06 ENCOUNTER — Encounter (HOSPITAL_COMMUNITY): Payer: Self-pay | Admitting: Pharmacy Technician

## 2012-02-12 ENCOUNTER — Other Ambulatory Visit: Payer: Self-pay | Admitting: Cardiovascular Disease

## 2012-02-13 ENCOUNTER — Encounter (HOSPITAL_COMMUNITY): Payer: Self-pay | Admitting: Cardiology

## 2012-02-13 ENCOUNTER — Encounter (HOSPITAL_COMMUNITY)
Admission: RE | Disposition: A | Discharge status: Home / Self Care | Payer: Self-pay | Source: Ambulatory Visit | Attending: Cardiovascular Disease

## 2012-02-13 ENCOUNTER — Ambulatory Visit (HOSPITAL_COMMUNITY)
Admission: RE | Admit: 2012-02-13 | Discharge: 2012-02-13 | Disposition: A | Discharge status: Home / Self Care | Payer: PRIVATE HEALTH INSURANCE | Source: Ambulatory Visit | Attending: Cardiovascular Disease | Admitting: Cardiovascular Disease

## 2012-02-13 DIAGNOSIS — D0510 Intraductal carcinoma in situ of unspecified breast: Secondary | ICD-10-CM | POA: Diagnosis present

## 2012-02-13 DIAGNOSIS — Z923 Personal history of irradiation: Secondary | ICD-10-CM | POA: Insufficient documentation

## 2012-02-13 DIAGNOSIS — N189 Chronic kidney disease, unspecified: Secondary | ICD-10-CM | POA: Insufficient documentation

## 2012-02-13 DIAGNOSIS — C50919 Malignant neoplasm of unspecified site of unspecified female breast: Secondary | ICD-10-CM | POA: Insufficient documentation

## 2012-02-13 DIAGNOSIS — N1832 Chronic kidney disease, stage 3b: Secondary | ICD-10-CM | POA: Diagnosis present

## 2012-02-13 DIAGNOSIS — R9439 Abnormal result of other cardiovascular function study: Secondary | ICD-10-CM

## 2012-02-13 DIAGNOSIS — I2510 Atherosclerotic heart disease of native coronary artery without angina pectoris: Secondary | ICD-10-CM | POA: Insufficient documentation

## 2012-02-13 DIAGNOSIS — R55 Syncope and collapse: Secondary | ICD-10-CM

## 2012-02-13 DIAGNOSIS — Z932 Ileostomy status: Secondary | ICD-10-CM

## 2012-02-13 DIAGNOSIS — R079 Chest pain, unspecified: Secondary | ICD-10-CM | POA: Insufficient documentation

## 2012-02-13 DIAGNOSIS — I129 Hypertensive chronic kidney disease with stage 1 through stage 4 chronic kidney disease, or unspecified chronic kidney disease: Secondary | ICD-10-CM | POA: Insufficient documentation

## 2012-02-13 DIAGNOSIS — Z87440 Personal history of urinary (tract) infections: Secondary | ICD-10-CM | POA: Diagnosis present

## 2012-02-13 DIAGNOSIS — Z8249 Family history of ischemic heart disease and other diseases of the circulatory system: Secondary | ICD-10-CM | POA: Insufficient documentation

## 2012-02-13 DIAGNOSIS — N289 Disorder of kidney and ureter, unspecified: Secondary | ICD-10-CM

## 2012-02-13 DIAGNOSIS — K56609 Unspecified intestinal obstruction, unspecified as to partial versus complete obstruction: Secondary | ICD-10-CM | POA: Diagnosis present

## 2012-02-13 HISTORY — DX: Abnormal result of other cardiovascular function study: R94.39

## 2012-02-13 HISTORY — PX: LEFT HEART CATHETERIZATION WITH CORONARY ANGIOGRAM: SHX5451

## 2012-02-13 HISTORY — DX: Syncope and collapse: R55

## 2012-02-13 HISTORY — DX: Disorder of kidney and ureter, unspecified: N28.9

## 2012-02-13 HISTORY — DX: Ileostomy status: Z93.2

## 2012-02-13 HISTORY — DX: Personal history of urinary (tract) infections: Z87.440

## 2012-02-13 LAB — POCT I-STAT, CHEM 8
BUN: 25 mg/dL — ABNORMAL HIGH (ref 6–23)
Calcium, Ion: 1.17 mmol/L (ref 1.13–1.30)
Chloride: 106 mEq/L (ref 96–112)
Creatinine, Ser: 1.4 mg/dL — ABNORMAL HIGH (ref 0.50–1.10)
HCT: 33 % — ABNORMAL LOW (ref 36.0–46.0)
TCO2: 21 mmol/L (ref 0–100)

## 2012-02-13 LAB — CBC
HCT: 35.1 % — ABNORMAL LOW (ref 36.0–46.0)
MCHC: 33.3 g/dL (ref 30.0–36.0)
MCV: 88.6 fL (ref 78.0–100.0)
Platelets: 250 10*3/uL (ref 150–400)
RDW: 13.6 % (ref 11.5–15.5)
WBC: 6.6 10*3/uL (ref 4.0–10.5)

## 2012-02-13 LAB — PROTIME-INR: INR: 1.01 (ref 0.00–1.49)

## 2012-02-13 SURGERY — LEFT HEART CATHETERIZATION WITH CORONARY ANGIOGRAM
Anesthesia: LOCAL

## 2012-02-13 MED ORDER — NITROGLYCERIN 0.2 MG/ML ON CALL CATH LAB
INTRAVENOUS | Status: AC
  Filled 2012-02-13: qty 1

## 2012-02-13 MED ORDER — POTASSIUM CHLORIDE CRYS ER 10 MEQ PO TBCR
10.0000 meq | EXTENDED_RELEASE_TABLET | ORAL | Status: DC
  Filled 2012-02-13: qty 1

## 2012-02-13 MED ORDER — HEPARIN (PORCINE) IN NACL 2-0.9 UNIT/ML-% IJ SOLN
INTRAMUSCULAR | Status: AC
  Filled 2012-02-13: qty 1000

## 2012-02-13 MED ORDER — SODIUM CHLORIDE 0.9 % IJ SOLN: 3.0000 mL | INTRAMUSCULAR | Status: DC | PRN

## 2012-02-13 MED ORDER — POTASSIUM CHLORIDE CRYS ER 20 MEQ PO TBCR
40.0000 meq | EXTENDED_RELEASE_TABLET | Freq: Once | ORAL | Status: AC
  Administered 2012-02-13: 40 meq via ORAL

## 2012-02-13 MED ORDER — LIDOCAINE HCL (PF) 1 % IJ SOLN
INTRAMUSCULAR | Status: AC
  Filled 2012-02-13: qty 30

## 2012-02-13 MED ORDER — SODIUM CHLORIDE 0.9 % IV SOLN
INTRAVENOUS | Status: DC
  Administered 2012-02-13: 14:00:00 via INTRAVENOUS

## 2012-02-13 MED ORDER — SODIUM CHLORIDE 0.9 % IV SOLN: INTRAVENOUS | Status: DC

## 2012-02-13 MED ORDER — POTASSIUM CHLORIDE CRYS ER 20 MEQ PO TBCR
EXTENDED_RELEASE_TABLET | ORAL | Status: AC
  Filled 2012-02-13: qty 2

## 2012-02-13 MED ORDER — DIAZEPAM 5 MG PO TABS
5.0000 mg | ORAL_TABLET | ORAL | Status: AC
  Administered 2012-02-13: 5 mg via ORAL

## 2012-02-13 MED ORDER — ONDANSETRON HCL 4 MG/2ML IJ SOLN: 4.0000 mg | Freq: Four times a day (QID) | INTRAMUSCULAR | Status: DC | PRN

## 2012-02-13 MED ORDER — ACETAMINOPHEN 325 MG PO TABS: 650.0000 mg | ORAL_TABLET | ORAL | Status: DC | PRN

## 2012-02-13 MED ORDER — MORPHINE SULFATE 4 MG/ML IJ SOLN: 1.0000 mg | INTRAMUSCULAR | Status: DC | PRN

## 2012-02-13 MED ORDER — DIAZEPAM 5 MG PO TABS
ORAL_TABLET | ORAL | Status: AC
  Administered 2012-02-13: 5 mg via ORAL
  Filled 2012-02-13: qty 1

## 2012-02-13 NOTE — H&P (Signed)
   Pt was reexamined and existing H & P reviewed. No changes found.  Lorretta Harp, MD [facility] 02/13/2012 3:20 PM

## 2012-02-13 NOTE — Op Note (Signed)
Deanna Burke is a 61 y.o. female    [id number] LOCATION:  FACILITY: [facility]  PHYSICIAN: Quay Burow, M.D. [birth year]   DATE OF PROCEDURE:  02/13/2012  DATE OF DISCHARGE:  [facility]  CARDIAC CATHETERIZATION     History obtained from chart review. 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  The stress Myoview revealed anterior lateral wall and normal EF of 63%. This was done for weakness fatigue chest discomfort that would come and twinges and strong family history. She presents now for elective cardiac catheterization to further evaluate her coronary artery disease.    PROCEDURE DESCRIPTION:    The patient was brought to the second floor  [facility] cath lab in the postabsorptive state. She was  premedicated with Valium 5 mg by mouth. Her right groin was prepped and shaved in usual sterile fashion. Xylocaine 1% was used  for local anesthesia. A 5 French sheath was inserted into the right common femoral artery using standard Seldinger technique. 5 French right and left Judkins diagnostic catheters along with a 5 French pigtail catheter were used for selective coronary angiography and left ventriculography respectively. Visipaque dye was used for the entirety of the case. Retrograde aortic, ventricular and pulmonary pressures were recorded.   HEMODYNAMICS:    AO SYSTOLIC/AO DIASTOLIC: 811/03   LV SYSTOLIC/LV DIASTOLIC: 159/45  ANGIOGRAPHIC RESULTS:   1. Left main; normal  2. LAD; fluoroscopically calcified in its proximal segment without any obstructive disease. 3. Left  circumflex; dominant with a 50% ostial OM1 stenosis.  4. Right coronary artery; nondominant and free of significant disease 5. Left ventriculography; RAO left ventriculogram was performed using  25 mL of Visipaque dye at 12 mL/second. The overall LVEF estimated  60 %  With/Without wall motion abnormalities  IMPRESSION:[name] has essentially normal coronary arteries and normal left ventricular function. I do not think her OM1 ostial stenosis is physiologically significant. I think that her Myoview was false positive and her symptoms noncardiac. Her right common femoral arterial puncture site was hemodynamically sealed with the "[facility] device. She left the Cath Lab in stable condition. She will remain recumbent for 2 hours and then will be discharged home for followup with Wannetta Sender  Lorretta Harp MD, [facility] 02/13/2012 4:04 PM

## 2012-02-13 NOTE — H&P (Signed)
Deanna Burke is an 61 y.o. female.   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  The stress Myoview revealed anterior lateral wall and normal EF of 63%. This was done for weakness fatigue chest discomfort that would come and twinges and strong family history. She presents now for elective cardiac catheterization to further evaluate her coronary artery disease.  She is pain-free on this exam  Past Medical History  Diagnosis Date  . Ulcerative colitis [phone]  . Pulmonary embolus 1971  . Phlebitis 1971  . Small bowel obstruction [id number]  . Small bowel perforation 1971/1996  . Large bowel perforation 1971/1996  . Scoliosis   . Torn rotator cuff 2005  . Sleep apnea 2009  . History of bone density study 2012  . Fatigue   . Cataract     Left Eye  . Hot flashes   . 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 Hypertension   . Cancer     DCIS L breast  . H/O ulcerative colitis   . Breast cancer   . Arthritis     hands, lumbar spine, hips, right ankle  . S/P ileostomy   . S/P radiation therapy 07/24/11 - 09/06/11    Left Breast/ 5000 cGy in 25 Fractions with Boost of 1000 cGy in 5 Fractions  . 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 Abnormal finding on cardiovascular stress test, ischemia anterolateral 02/13/2012  . Syncope, ? anginal equivilant 02/13/2012  . Ileostomy in place, secondary to ulcerative colitis x 20 years 02/13/2012  . History of  recurrent UTIs 02/13/2012    Past Surgical History  Procedure Date  . Colectomy 1973  . Ileostomy 1973  . Vaginoplasty 1975  . Exploratory laparotomy 1978/1990    with lysis of adhesions  . Eye surgery 05/08/2011    left cataract removal   . Appendectomy   . Rotator cuff repair 2006    Right  . Breast surgery 05/03/11    LEFT BREAST NEEDLE CORE BIOPSY- DCIS  . 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  ER:+, PR+,   . Abdominal hysterectomy 1996    partial  . Exploratory laps     several due to abd pain related to ulcerative colitis    Family History  Problem Relation Age of Onset  . Breast cancer Maternal Grandmother   . Stomach cancer Maternal Grandfather   . Cancer Paternal Grandmother     stomach  . Cancer Other     Breast  . Atrial fibrillation Mother   . Heart attack Father   . Atrial fibrillation Brother   . Heart attack Paternal Uncle    Social History:  reports that she has never smoked. She does not have any smokeless tobacco history on file. She reports that she does not drink alcohol or use illicit drugs.She is divorced and no children.  Allergies:  Allergies  Allergen Reactions  .  Demerol Nausea And Vomiting  . Dilaudid (Hydromorphone Hcl) Nausea And Vomiting  . Stadol (Butorphanol Tartrate) Nausea And Vomiting  . Amoxicillin Rash  . Cephalexin Rash  . Penicillins Rash    Medications Prior to Admission  Medication Sig Dispense Refill  . aspirin 325 MG tablet Take 325 mg by mouth daily.      . Bismuth Subgallate (DEVROM) 200 MG CHEW Chew 400 mg by mouth 4 (four) times daily.      . calcium carbonate (OS-CAL) 600 MG TABS Take 600 mg by mouth 2 (two) times daily with a meal.      . ferrous fumarate (HEMOCYTE - 106 MG FE) 325 (106 FE) MG TABS Take 1 tablet by mouth.      . Multiple Vitamins-Minerals (MULTIVITAMIN WITH MINERALS) tablet Take 1 tablet by mouth  daily.      . nitrofurantoin (MACRODANTIN) 50 MG capsule Take 50 mg by mouth daily.      . tamoxifen (NOLVADEX) 10 MG tablet Take 20 mg by mouth daily.       [name] triamterene-hydrochlorothiazide (DYAZIDE) 37.5-25 MG per capsule Take 1 capsule by mouth every morning.      . vitamin C (ASCORBIC ACID) 500 MG tablet Take 1,000 mg by mouth daily.      . vitamin E 400 UNIT capsule Take 400 Units by mouth daily.        Results for orders placed during the hospital encounter of 02/13/12 (from the past 48 hour(s))  CBC     Status: Abnormal   Collection Time   02/13/12  1:30 PM      Component Value Range Comment   WBC 6.6  4.0 - 10.5 K/uL    RBC 3.96  3.87 - 5.11 MIL/uL    Hemoglobin 11.7 (*) 12.0 - 15.0 g/dL    HCT 35.1 (*) 36.0 - 46.0 %    MCV 88.6  78.0 - 100.0 fL    MCH 29.5  26.0 - 34.0 pg    MCHC 33.3  30.0 - 36.0 g/dL    RDW 13.6  11.5 - 15.5 %    Platelets 250  150 - 400 K/uL   PROTIME-INR     Status: Normal   Collection Time   02/13/12  1:30 PM      Component Value Range Comment   Prothrombin Time 13.2  11.6 - 15.2 seconds    INR 1.01  0.00 - 1.49    No results found.  ROS: General:No colds Or fevers Skin:No rashes no ulcer HEENT:No blurred vision or double vision history of left cataract removal CV:See history of present illness PUL:No shortness of breath GI:No diarrhea constipation or melena GU:No current hematuria or dysuria has just completed Levaquin, and has frequent recurrent urinary tract infections for the last year, we'll followup with infectious disease in the near future to see if this is due to a systemic infection ZO:XWRUEAV of rotator cuff repair, Has been having discomfort between her shoulder blades Neuro:One episode of syncope otherwise no dizziness Endo:No diabetes or thyroid disease   Blood pressure 116/77, pulse 98, temperature 97.6 F (36.4 C), temperature source Oral, resp. rate 18, height 5' 4"  (1.626 m), weight 71.668 kg (158 lb), SpO2 96.00%. PE:   General:Alert and oriented x3, pleasant affect, no acute distress Skin:Warm and dry brisk capillary refill HEENT:Normocephalic sclera clear Neck:Supple no JVD no carotid bruit Heart:S1-S2 regular rate and rhythm without obvious murmur gallop or Lungs:Clear without rales rhonchi or wheezes WUJ:WJXBJYNWG is in place  abdomen soft nontender positive bowel sounds Ext:No edema 2+ pedal pulses bilaterally Neuro:Alert and oriented x3 moves all extremities follows commands  Assessment/Plan Principal Problem:  *Abnormal finding on cardiovascular stress test, ischemia anterolateral Active Problems:  DCIS (ductal carcinoma in situ) of breast, treated with lumpectomy and radiation  Small bowel obstruction, resolved  Kidney disease, with 15% use of Rt 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 anginal equivilant  Ileostomy in place, secondary to ulcerative colitis x 20 years  History of recurrent UTIs  Plan: Patient is here for elective cardiac catheterization, secondary to anginal symptoms and a positive stress Myoview.  She does have only 15% use of the right kidney due to a congenital hydroureter, waiting for labs prior to taking her to the cath lab.  JLUNGB,MBOMQ R 02/13/2012, 2:33 PM    Agree with note written by Cecilie Kicks RNP  + Symptoms, +CRF,+ Myoview for cath  [facility] 02/13/2012 3:17 PM

## 2012-02-19 ENCOUNTER — Other Ambulatory Visit: Payer: Self-pay | Admitting: Internal Medicine

## 2012-02-19 DIAGNOSIS — Z853 Personal history of malignant neoplasm of breast: Secondary | ICD-10-CM

## 2012-02-22 ENCOUNTER — Ambulatory Visit: Payer: PRIVATE HEALTH INSURANCE | Admitting: Internal Medicine

## 2012-02-28 ENCOUNTER — Ambulatory Visit (INDEPENDENT_AMBULATORY_CARE_PROVIDER_SITE_OTHER): Payer: PRIVATE HEALTH INSURANCE | Admitting: Internal Medicine

## 2012-02-28 ENCOUNTER — Encounter: Payer: Self-pay | Admitting: Internal Medicine

## 2012-02-28 VITALS — BP 129/84 | HR 109 | Temp 98.2°F | Ht 64.0 in | Wt 165.5 lb

## 2012-02-28 DIAGNOSIS — Z87440 Personal history of urinary (tract) infections: Secondary | ICD-10-CM

## 2012-02-28 NOTE — Progress Notes (Signed)
Patient ID: Deanna Burke, female   DOB: 09-25-1950, 61 y.o.   MRN: [id number]    [facility]  Reason for Consult: Recurrent cystitis Referring Physician: Dr. Rana Snare  Patient Active Problem List  Diagnosis  . DCIS (ductal carcinoma in situ) of breast, treated with lumpectomy and radiation  . Malignant neoplasm of upper-outer quadrant of female breast  . Small bowel obstruction, resolved  . 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 Cancer  . Breast cancer  . 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 Abnormal finding on cardiovascular stress test, ischemia anterolateral  . Syncope, ? anginal equivilant  . Ileostomy in place, secondary to ulcerative colitis x 20 years  . History of recurrent UTIs    Patient's Medications  New Prescriptions   No medications on file  Previous Medications   ASPIRIN 325 MG TABLET    Take 325 mg by mouth daily.   BISMUTH SUBGALLATE (DEVROM) 200 MG CHEW    Chew 400 mg by mouth 4 (four) times daily.   CALCIUM CARBONATE (OS-CAL) 600 MG TABS    Take 600 mg by mouth 2 (two) times daily with a meal.   FERROUS FUMARATE (HEMOCYTE - 106 MG FE) 325 (106 FE) MG TABS    Take 1 tablet by mouth.   FISH OIL-OMEGA-3 FATTY ACIDS 1000 MG CAPSULE    Take 1 g by mouth daily.   MULTIPLE VITAMINS-MINERALS (MULTIVITAMIN WITH MINERALS) TABLET    Take 1 tablet by mouth daily.   TAMOXIFEN (NOLVADEX) 10 MG TABLET    Take 20 mg by mouth daily.    TRIAMTERENE-HYDROCHLOROTHIAZIDE (DYAZIDE) 37.5-25 MG PER CAPSULE    Take 1 capsule by mouth every morning.   TRIMETHOPRIM (TRIMPEX) 100 MG TABLET    Take 100 mg by mouth daily.   VITAMIN C (ASCORBIC ACID) 500 MG TABLET    Take 1,000 mg by mouth daily.   VITAMIN E 400 UNIT CAPSULE    Take 400 Units by mouth daily.   ZOLPIDEM (AMBIEN) 5 MG TABLET    Take 5 mg by mouth at bedtime as needed.  Modified Medications   No medications on file  Discontinued Medications   CRANBERRY EXTRACT PO     Take 1 tablet by mouth daily.   METHEN-HYOSC-METH BLUE-NA PHOS (UROGESIC-BLUE) 81.6 MG TABS    Take 1 tablet by mouth 3 (three) times daily as needed.   NITROFURANTOIN (MACRODANTIN) 50 MG CAPSULE    Take 50 mg by mouth daily.    Recommendations: 1. Repeat UA and urine culture 2. I will call her with results tomorrow   Assessment: Her symptoms, urinalyses and urine culture certainly suggest recurrent cystitis do to a variety of organisms. I will check a urinalysis and repeat urine culture today to see if she has a different organism that might be unresponsive to trimethoprim now. I will also consider putting her back on Macrodantin prophylaxis since that is the only treatment that has given her lasting relief of her symptoms over the past year.   All of her symptoms appeared to arise from the bladder and do not suggest pyelonephritis. I doubt that the stone or dilatation of the right renal collecting system are related to her symptoms.  HPI: Deanna Burke is a 61 y.o. female who is referred to me by Dr. Risa Grill for evaluation of recurrent urinary tract infections over the past year. She states that over the past 30 or 40 years she's  had 1-2 episodes of bladder infection each year. Starting one year ago she began to have more frequent episodes without any obvious reason. She described each episode as a burning sensation in her "private parts", suprapubic tenderness, dysuria and cloudy urine. She states that her primary care physician, Dr. Holley Raring, obtained urine for urinalysis and culture. Her urine cultures grew Klebsiella in September and November of last year. She recalls being on ciprofloxacin on multiple occasions usually with a complete and prompt response. However once stopping ciprofloxacin she would have recurrences. Initially recurrences came about once each month. However they started to be more frequent so she was switched to oral levofloxacin and doxycycline earlier this year. Urine  cultures grew enterococcus on multiple occasions. She was then admitted to the hospital with small bowel obstruction and was seen by Dr. Risa Grill. She was tried on daily Macrodantin and did well for 3 months from late April until July. She recalls that the dose of Macrodantin was decreased and within a short period of time she was starting to have recurrences again. Since that time she has been on continuous levofloxacin until urine culture grew Providencia one month ago. She was switched to trimethoprim 2 weeks ago and feels a little bit better but has not had complete resolution of her symptoms. She has not had any fever, chills or sweats. She denies any vaginal discharge or itching. She states that she does note that when she drinks carbonated sodas, coffee or tea it will seem to exacerbate her symptoms. She also noticed a transient worsening last week after eating some chocolate. She states that she is very concerned about these persistent bladder symptoms. She has been under a great deal of stress this past year do to also having to undergo treatment for her breast cancer. At one point there was concern that her symptoms might be do to vaginal dryness and there was consideration of vaginal estrogen cream therapy. However that was never utilized because she denies having vaginal dryness currently and did not want to take any risk of medications that might cause a recurrence of her breast cancer more likely. She has been living with an ileostomy for the last 40 years after a severe flare of ulcerative colitis. She states that she lives in fear of having severe bladder problems that could require surgery and she is not sure that she could withstand that. She is questioning whether or not she should undergo a cystoscopy in case there are other problems in her bladder causing her symptoms. Renal ultrasound has revealed a stone in the renal pelvis and dilatation of the collecting system but Dr. Cy Blamer notes indicate  that he believes that the dilatation may be congenital and unrelated to the stone.  Review of Systems: Pertinent items are noted in HPI.      Past Medical History  Diagnosis Date  . Ulcerative colitis [phone]  . Pulmonary embolus 1971  . Phlebitis 1971  . Small bowel obstruction [id number]  . Small bowel perforation 1971/1996  . Large bowel perforation 1971/1996  . Scoliosis   . Torn rotator cuff 2005  . Sleep apnea 2009  . History of bone density study 2012  . Fatigue   . Cataract     Left Eye  . Hot flashes   . 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 Hypertension   . Cancer     DCIS L breast  . H/O ulcerative colitis   .  Breast cancer   . Arthritis     hands, lumbar spine, hips, right ankle  . S/P ileostomy   . S/P radiation therapy 07/24/11 - 09/06/11    Left Breast/ 5000 cGy in 25 Fractions with Boost of 1000 cGy in 5 Fractions  . 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 Abnormal finding on cardiovascular stress test, ischemia anterolateral 02/13/2012  . Syncope, ? anginal equivilant 02/13/2012  . Ileostomy in place, secondary to ulcerative colitis x 20 years 02/13/2012  . History of recurrent UTIs 02/13/2012    History  Substance Use Topics  . Smoking status: Never Smoker   . Smokeless tobacco: Not on file  . Alcohol Use: No    Family History  Problem Relation Age of Onset  . Breast cancer Maternal Grandmother   . Stomach cancer Maternal Grandfather   . Cancer Paternal Grandmother     stomach  . Cancer Other     Breast  . Atrial fibrillation Mother   . Heart attack Father   . Atrial fibrillation Brother   . Heart attack Paternal Uncle    Allergies  Allergen Reactions  . Demerol Nausea And Vomiting  . Dilaudid (Hydromorphone Hcl) Nausea And Vomiting  . Stadol (Butorphanol Tartrate) Nausea And Vomiting  . Amoxicillin Rash  . Cephalexin Rash  . Penicillins Rash     OBJECTIVE: Blood pressure 129/84, pulse 109, temperature 98.2 F (36.8 C), temperature source Oral, height 5' 4"  (1.626 m), weight 165 lb 8 oz (75.07 kg). General: She is well dressed and in good spirits Skin: No rash Lungs: Clear Cor: Regular S1 and S2 no murmurs Abdomen: Ileostomy bag present. Soft and nontender. No suprapubic or CVA tenderness   Microbiology: No results found for this or any previous visit (from the past 240 hour(s)).  Michel Bickers, MD [facility] [phone] pager   [phone] cell 02/28/2012, 2:35 PM

## 2012-02-29 LAB — URINALYSIS, ROUTINE W REFLEX MICROSCOPIC
Ketones, ur: NEGATIVE mg/dL
Nitrite: NEGATIVE
Specific Gravity, Urine: 1.017 (ref 1.005–1.030)
Urobilinogen, UA: 0.2 mg/dL (ref 0.0–1.0)

## 2012-02-29 LAB — URINALYSIS, MICROSCOPIC ONLY
Casts: NONE SEEN
WBC, UA: >50 WBC/hpf — AB (ref <3)

## 2012-03-01 ENCOUNTER — Telehealth: Payer: Self-pay | Admitting: *Deleted

## 2012-03-01 NOTE — Telephone Encounter (Signed)
Patient called requesting that Dr. Megan Salon call her cell # on Monday with results [phone] Deanna Burke

## 2012-03-02 LAB — CULTURE, URINE COMPREHENSIVE: Colony Count: >=100000

## 2012-03-04 ENCOUNTER — Other Ambulatory Visit: Payer: Self-pay | Admitting: Internal Medicine

## 2012-03-04 DIAGNOSIS — Z87440 Personal history of urinary (tract) infections: Secondary | ICD-10-CM

## 2012-03-04 MED ORDER — NITROFURANTOIN MONOHYD MACRO 100 MG PO CAPS: ORAL_CAPSULE | ORAL | Status: DC

## 2012-03-04 NOTE — Telephone Encounter (Signed)
Her urine culture grew enterococcus sensitive to nitrofurantoin. I asked her to stop taking the trimethoprim and switched her to nitrofurantoin 100 mg by mouth twice daily for 7 days before switching to a suppressive dose of 100 mg by mouth at bedtime thereafter.

## 2012-04-01 ENCOUNTER — Ambulatory Visit
Admission: RE | Admit: 2012-04-01 | Discharge: 2012-04-01 | Disposition: A | Discharge status: Home / Self Care | Payer: PRIVATE HEALTH INSURANCE | Source: Ambulatory Visit | Attending: Internal Medicine | Admitting: Internal Medicine

## 2012-04-01 DIAGNOSIS — Z853 Personal history of malignant neoplasm of breast: Secondary | ICD-10-CM

## 2012-04-17 ENCOUNTER — Inpatient Hospital Stay (HOSPITAL_COMMUNITY)
Admission: EM | Admit: 2012-04-17 | Discharge: 2012-04-23 | DRG: 389 | Disposition: A | Discharge status: Home / Self Care | Payer: PRIVATE HEALTH INSURANCE | Attending: Surgery | Admitting: Surgery

## 2012-04-17 ENCOUNTER — Encounter (HOSPITAL_COMMUNITY): Payer: Self-pay | Admitting: *Deleted

## 2012-04-17 ENCOUNTER — Emergency Department (HOSPITAL_COMMUNITY): Payer: PRIVATE HEALTH INSURANCE

## 2012-04-17 DIAGNOSIS — M479 Spondylosis, unspecified: Secondary | ICD-10-CM | POA: Diagnosis present

## 2012-04-17 DIAGNOSIS — E876 Hypokalemia: Secondary | ICD-10-CM | POA: Diagnosis not present

## 2012-04-17 DIAGNOSIS — Z853 Personal history of malignant neoplasm of breast: Secondary | ICD-10-CM

## 2012-04-17 DIAGNOSIS — Z7982 Long term (current) use of aspirin: Secondary | ICD-10-CM

## 2012-04-17 DIAGNOSIS — K56609 Unspecified intestinal obstruction, unspecified as to partial versus complete obstruction: Principal | ICD-10-CM | POA: Diagnosis present

## 2012-04-17 DIAGNOSIS — N390 Urinary tract infection, site not specified: Secondary | ICD-10-CM | POA: Diagnosis present

## 2012-04-17 DIAGNOSIS — G4730 Sleep apnea, unspecified: Secondary | ICD-10-CM | POA: Diagnosis present

## 2012-04-17 DIAGNOSIS — Z8249 Family history of ischemic heart disease and other diseases of the circulatory system: Secondary | ICD-10-CM

## 2012-04-17 DIAGNOSIS — Q6212 Congenital occlusion of ureterovesical orifice: Secondary | ICD-10-CM

## 2012-04-17 DIAGNOSIS — Z9089 Acquired absence of other organs: Secondary | ICD-10-CM

## 2012-04-17 DIAGNOSIS — Z885 Allergy status to narcotic agent status: Secondary | ICD-10-CM

## 2012-04-17 DIAGNOSIS — Z880 Allergy status to penicillin: Secondary | ICD-10-CM

## 2012-04-17 DIAGNOSIS — Z881 Allergy status to other antibiotic agents status: Secondary | ICD-10-CM

## 2012-04-17 DIAGNOSIS — Z86711 Personal history of pulmonary embolism: Secondary | ICD-10-CM

## 2012-04-17 DIAGNOSIS — Z923 Personal history of irradiation: Secondary | ICD-10-CM

## 2012-04-17 DIAGNOSIS — Z8719 Personal history of other diseases of the digestive system: Secondary | ICD-10-CM

## 2012-04-17 DIAGNOSIS — Z888 Allergy status to other drugs, medicaments and biological substances status: Secondary | ICD-10-CM

## 2012-04-17 DIAGNOSIS — M129 Arthropathy, unspecified: Secondary | ICD-10-CM | POA: Diagnosis present

## 2012-04-17 DIAGNOSIS — Z79899 Other long term (current) drug therapy: Secondary | ICD-10-CM

## 2012-04-17 DIAGNOSIS — Z87440 Personal history of urinary (tract) infections: Secondary | ICD-10-CM

## 2012-04-17 DIAGNOSIS — M4120 Other idiopathic scoliosis, site unspecified: Secondary | ICD-10-CM | POA: Diagnosis present

## 2012-04-17 DIAGNOSIS — Z9049 Acquired absence of other specified parts of digestive tract: Secondary | ICD-10-CM

## 2012-04-17 DIAGNOSIS — Z932 Ileostomy status: Secondary | ICD-10-CM

## 2012-04-17 LAB — CBC WITH DIFFERENTIAL/PLATELET
Eosinophils Absolute: 0 10*3/uL (ref 0.0–0.7)
Hemoglobin: 15 g/dL (ref 12.0–15.0)
Lymphocytes Relative: 4 % — ABNORMAL LOW (ref 12–46)
Lymphs Abs: 0.4 10*3/uL — ABNORMAL LOW (ref 0.7–4.0)
MCH: 30.1 pg (ref 26.0–34.0)
Neutro Abs: 11 10*3/uL — ABNORMAL HIGH (ref 1.7–7.7)
Neutrophils Relative %: 93 % — ABNORMAL HIGH (ref 43–77)
Platelets: 310 10*3/uL (ref 150–400)
RBC: 4.98 MIL/uL (ref 3.87–5.11)
WBC: 11.8 10*3/uL — ABNORMAL HIGH (ref 4.0–10.5)

## 2012-04-17 LAB — COMPREHENSIVE METABOLIC PANEL
ALT: 15 U/L (ref 0–35)
Alkaline Phosphatase: 64 U/L (ref 39–117)
Chloride: 96 mEq/L (ref 96–112)
GFR calc Af Amer: 50 mL/min — ABNORMAL LOW (ref >90)
Glucose, Bld: 134 mg/dL — ABNORMAL HIGH (ref 70–99)
Potassium: 3 mEq/L — ABNORMAL LOW (ref 3.5–5.1)
Sodium: 138 mEq/L (ref 135–145)
Total Bilirubin: 1.3 mg/dL — ABNORMAL HIGH (ref 0.3–1.2)
Total Protein: 8.2 g/dL (ref 6.0–8.3)

## 2012-04-17 LAB — URINALYSIS, ROUTINE W REFLEX MICROSCOPIC
Bilirubin Urine: NEGATIVE
Glucose, UA: NEGATIVE mg/dL
pH: 5.5 (ref 5.0–8.0)

## 2012-04-17 LAB — URINE MICROSCOPIC-ADD ON

## 2012-04-17 MED ORDER — HEPARIN SODIUM (PORCINE) 5000 UNIT/ML IJ SOLN
5000.0000 [IU] | Freq: Three times a day (TID) | INTRAMUSCULAR | Status: DC
  Administered 2012-04-17 – 2012-04-23 (×18): 5000 [IU] via SUBCUTANEOUS
  Filled 2012-04-17 (×21): qty 1

## 2012-04-17 MED ORDER — SODIUM CHLORIDE 0.9 % IV BOLUS (SEPSIS)
500.0000 mL | Freq: Once | INTRAVENOUS | Status: AC
  Administered 2012-04-17: 500 mL via INTRAVENOUS

## 2012-04-17 MED ORDER — ONDANSETRON HCL 4 MG/2ML IJ SOLN
4.0000 mg | Freq: Once | INTRAMUSCULAR | Status: AC
  Administered 2012-04-17: 4 mg via INTRAVENOUS
  Filled 2012-04-17: qty 2

## 2012-04-17 MED ORDER — KCL IN DEXTROSE-NACL 20-5-0.9 MEQ/L-%-% IV SOLN
INTRAVENOUS | Status: DC
  Administered 2012-04-17 – 2012-04-21 (×7): via INTRAVENOUS
  Filled 2012-04-17 (×14): qty 1000

## 2012-04-17 MED ORDER — ONDANSETRON HCL 4 MG/2ML IJ SOLN
4.0000 mg | Freq: Four times a day (QID) | INTRAMUSCULAR | Status: DC | PRN
  Administered 2012-04-18 – 2012-04-21 (×6): 4 mg via INTRAVENOUS
  Filled 2012-04-17 (×6): qty 2

## 2012-04-17 MED ORDER — MORPHINE SULFATE 2 MG/ML IJ SOLN
2.0000 mg | INTRAMUSCULAR | Status: DC | PRN
  Administered 2012-04-17 – 2012-04-20 (×5): 2 mg via INTRAVENOUS
  Filled 2012-04-17: qty 1
  Filled 2012-04-17: qty 2
  Filled 2012-04-17 (×3): qty 1

## 2012-04-17 MED ORDER — PANTOPRAZOLE SODIUM 40 MG IV SOLR
40.0000 mg | Freq: Every day | INTRAVENOUS | Status: DC
  Administered 2012-04-17 – 2012-04-22 (×6): 40 mg via INTRAVENOUS
  Filled 2012-04-17 (×7): qty 40

## 2012-04-17 MED ORDER — FENTANYL CITRATE 0.05 MG/ML IJ SOLN
50.0000 ug | Freq: Once | INTRAMUSCULAR | Status: AC
  Administered 2012-04-17: 50 ug via INTRAVENOUS
  Filled 2012-04-17: qty 2

## 2012-04-17 NOTE — ED Notes (Signed)
Patient transported to X-ray 

## 2012-04-17 NOTE — ED Provider Notes (Signed)
History     CSN: [id number]  Arrival date & time 04/17/12  1003   First MD Initiated Contact with Patient 04/17/12 1021      Chief Complaint  Patient presents with  . small bowel obstruction,ileostomy problem     (Consider location/radiation/quality/duration/timing/severity/associated sxs/prior treatment) The history is provided by the patient.   patient believes she has a small bowel obstruction. She is at a stop to her ileostomy output. He began earlier today. She's had some vomiting. She's had no ostomy output. She states her abdomen is tender and getting larger. He's a previous history of ulcerative colitis for which she had a total colectomy. She has had previous obstructions also. No fevers.  Past Medical History  Diagnosis Date  . Ulcerative colitis [phone]  . Pulmonary embolus 1971  . Phlebitis 1971  . Small bowel obstruction [id number]  . Small bowel perforation 1971/1996  . Large bowel perforation 1971/1996  . Scoliosis   . Torn rotator cuff 2005  . Sleep apnea 2009  . History of bone density study 2012  . Fatigue   . Cataract     Left Eye  . Hot flashes   . 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 Hypertension   . Cancer     DCIS L breast  . H/O ulcerative colitis   . Breast cancer   . Arthritis     hands, lumbar spine, hips, right ankle  . S/P ileostomy   . S/P radiation therapy 07/24/11 - 09/06/11    Left Breast/ 5000 cGy in 25 Fractions with Boost of 1000 cGy in 5 Fractions  . 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 Abnormal finding on cardiovascular stress test, ischemia anterolateral 02/13/2012  . Syncope, ? anginal equivilant 02/13/2012  . Ileostomy in place, secondary to ulcerative colitis x 20 years 02/13/2012  . History of recurrent UTIs 02/13/2012    Past Surgical History  Procedure Date  . Colectomy 1973  . Ileostomy 1973  . Vaginoplasty 1975  .  Exploratory laparotomy 1978/1990    with lysis of adhesions  . Eye surgery 05/08/2011    left cataract removal   . Appendectomy   . Rotator cuff repair 2006    Right  . Breast surgery 05/03/11    LEFT BREAST NEEDLE CORE BIOPSY- DCIS  . 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  ER:+, PR+,   . Abdominal hysterectomy 1996    partial  . Exploratory laps     several due to abd pain related to ulcerative colitis    Family History  Problem Relation Age of Onset  . Breast cancer Maternal Grandmother   . Stomach cancer Maternal Grandfather   . Cancer Paternal Grandmother     stomach  . Cancer Other     Breast  . Atrial fibrillation Mother   . Heart attack Father   . Atrial fibrillation Brother   . Heart attack Paternal Uncle     History  Substance Use Topics  . Smoking status: Never Smoker   . Smokeless tobacco: Never Used  . Alcohol Use: No    OB History    Grav Para Term Preterm Abortions TAB SAB Ect Mult Living                  Review  of Systems  Constitutional: Negative for activity change and appetite change.  HENT: Negative for neck stiffness.   Eyes: Negative for pain.  Respiratory: Negative for chest tightness and shortness of breath.   Cardiovascular: Negative for chest pain and leg swelling.  Gastrointestinal: Positive for nausea, vomiting, abdominal pain and abdominal distention. Negative for diarrhea.  Genitourinary: Negative for flank pain.  Musculoskeletal: Negative for back pain.  Skin: Negative for rash.  Neurological: Negative for weakness, numbness and headaches.  Psychiatric/Behavioral: Negative for behavioral problems.    Allergies  Demerol; Dilaudid; Stadol; Amoxicillin; Cephalexin; and Penicillins  Home Medications   No current outpatient prescriptions on file.  BP 138/81  Pulse 98  Temp 98.3 F (36.8 C) (Oral)  Resp 18  SpO2  95%  Physical Exam  Nursing note and vitals reviewed. Constitutional: She is oriented to person, place, and time. She appears well-developed and well-nourished.  HENT:  Head: Normocephalic and atraumatic.  Eyes: EOM are normal. Pupils are equal, round, and reactive to light.  Neck: Normal range of motion. Neck supple.  Cardiovascular: Normal rate, regular rhythm and normal heart sounds.   No murmur heard. Pulmonary/Chest: Effort normal and breath sounds normal. No respiratory distress. She has no wheezes. She has no rales.  Abdominal: She exhibits distension. There is tenderness. There is no rebound and no guarding.       Patient with an ileostomy in the right lower quadrant. Pink stoma. No palpated hernias. Abdomen is distended and somewhat tense. There is diffuse tenderness. Decreased bowel sounds throughout.  Musculoskeletal: Normal range of motion.  Neurological: She is alert and oriented to person, place, and time. No cranial nerve deficit.  Skin: Skin is warm and dry.  Psychiatric: She has a normal mood and affect. Her speech is normal.    ED Course  Procedures (including critical care time)  Labs Reviewed  CBC WITH DIFFERENTIAL - Abnormal; Notable for the following:    WBC 11.8 (*)     Neutrophils Relative 93 (*)     Neutro Abs 11.0 (*)     Lymphocytes Relative 4 (*)     Lymphs Abs 0.4 (*)     All other components within normal limits  COMPREHENSIVE METABOLIC PANEL - Abnormal; Notable for the following:    Potassium 3.0 (*)     Glucose, Bld 134 (*)     BUN 31 (*)     Creatinine, Ser 1.28 (*)     Total Bilirubin 1.3 (*)     GFR calc non Af Amer 44 (*)     GFR calc Af Amer 50 (*)     All other components within normal limits  LIPASE, BLOOD  URINALYSIS, ROUTINE W REFLEX MICROSCOPIC   Dg Abd Acute W/chest  04/17/2012  *RADIOLOGY REPORT*  Clinical Data: Vomiting  ACUTE ABDOMEN SERIES (ABDOMEN 2 VIEW & CHEST 1 VIEW)  Comparison: 08/08/2011 and 05/24/2011  Findings:  Cardiomediastinal silhouette is stable.  No acute infiltrate or pleural effusion.  No pulmonary edema.  Levoscoliosis of the lumbar spine is noted.  Distended small bowel loops right abdomen with multiple air-fluid levels highly suspicious for bowel obstruction.  No free abdominal air.  IMPRESSION: No acute disease within the chest.  Distended small bowel loops right abdomen with air-fluid levels highly suspicious for small bowel obstruction.   Original Report Authenticated By: Lahoma Crocker, M.D.      1. Small bowel obstruction       MDM  Patient with long-term ileostomy after colectomy do  to ulcerative colitis. She has symptoms of small bowel obstruction x-ray shows small bowel obstruction. She will be admitted to general surgery.        Jasper Riling. Alvino Chapel, MD 04/17/12 1600

## 2012-04-17 NOTE — ED Notes (Signed)
Report called to [place], RN pt will be transported to [id number]. Pt and family is aware of the transfer and verbalizes understanding.

## 2012-04-17 NOTE — ED Notes (Signed)
Pt states "she began having severe abd pain, discomfort, slight bloating, and nausea during the night and late yest evening. Pain comes in waves." She has hx of multiple abd surgeries with ileostomy. Pt states "she probably has an obstruction bc she felt this way before. Abd tender, slightly distended, guarded.

## 2012-04-17 NOTE — H&P (Signed)
Deanna Burke is an 61 y.o. female.   Chief Complaint: abdominal pain, nausea and vomiting HPI: patient is a 61 year old female with a history of total colectomy and ileostomy in the 1970s secondary to ulcerative colitis. She has had several abdominal procedures since. She had a laparotomy for small bowel obstruction in the 1970s and had a small bowel resection by me in the 1990s. She has had several episodes of small bowel obstruction previously that have resolved nonoperatively. The last time was in April of this year. This resolved without surgery and she has done well until yesterday. She developed initially abdominal swelling and crampy diffuse pain followed by several episodes of vomiting which were green and then feculent. She states she has not had ileostomy output since yesterday afternoon. She has chronic urinary infections but no current symptoms. No fever or chills. No blood per stoma.  Past Medical History  Diagnosis Date  . Ulcerative colitis [phone]  . Pulmonary embolus 1971  . Phlebitis 1971  . Small bowel obstruction [id number]  . Small bowel perforation 1971/1996  . Large bowel perforation 1971/1996  . Scoliosis   . Torn rotator cuff 2005  . Sleep apnea 2009  . History of bone density study 2012  . Fatigue   . Cataract     Left Eye  . Hot flashes   . 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 Hypertension   . Cancer     DCIS L breast  . H/O ulcerative colitis   . Breast cancer   . Arthritis     hands, lumbar spine, hips, right ankle  . S/P ileostomy   . S/P radiation therapy 07/24/11 - 09/06/11    Left Breast/ 5000 cGy in 25 Fractions with Boost of 1000 cGy in 5 Fractions  . 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 Abnormal finding on cardiovascular stress test, ischemia anterolateral 02/13/2012  . Syncope, ? anginal equivilant 02/13/2012  . Ileostomy in place, secondary to  ulcerative colitis x 20 years 02/13/2012  . History of recurrent UTIs 02/13/2012    Past Surgical History  Procedure Date  . Colectomy 1973  . Ileostomy 1973  . Vaginoplasty 1975  . Exploratory laparotomy 1978/1990    with lysis of adhesions  . Eye surgery 05/08/2011    left cataract removal   . Appendectomy   . Rotator cuff repair 2006    Right  . Breast surgery 05/03/11    LEFT BREAST NEEDLE CORE BIOPSY- DCIS  . 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  ER:+, PR+,   . Abdominal hysterectomy 1996    partial  . Exploratory laps     several due to abd pain related to ulcerative colitis    Family History  Problem Relation Age of Onset  . Breast cancer Maternal Grandmother   . Stomach cancer Maternal Grandfather   . Cancer Paternal Grandmother     stomach  . Cancer Other     Breast  . Atrial fibrillation Mother   . Heart attack Father   . Atrial fibrillation Brother   . Heart attack Paternal Uncle    Social History:  reports that she has never smoked. She does not have any smokeless tobacco history on file. She reports that she does not drink alcohol or use illicit  drugs.  Allergies:  Allergies  Allergen Reactions  . Demerol Nausea And Vomiting  . Dilaudid (Hydromorphone Hcl) Nausea And Vomiting and Other (See Comments)    Reaction: muscle flexion and cramping also with nausea and vomiting  . Stadol (Butorphanol Tartrate) Nausea And Vomiting  . Amoxicillin Rash  . Cephalexin Rash  . Penicillins Rash    Current Facility-Administered Medications  Medication Dose Route Frequency Provider Last Rate Last Dose  . sodium chloride 0.9 % bolus 500 mL  500 mL Intravenous Once Ovid Curd R. Pickering, MD 500 mL/hr at 04/17/12 1111 500 mL at 04/17/12 1111   Current Outpatient Prescriptions  Medication Sig Dispense Refill  . aspirin 325 MG tablet Take 325 mg by mouth at bedtime.        . Bismuth Subgallate (DEVROM) 200 MG CHEW Chew 400 mg by mouth 4 (four) times daily.      . calcium carbonate (OS-CAL) 600 MG TABS Take 600 mg by mouth 2 (two) times daily with a meal.      . diclofenac sodium (VOLTAREN) 1 % GEL Apply 2 g topically 4 (four) times daily as needed. For shoulder/ joint pain      . ferrous sulfate 325 (65 FE) MG tablet Take 325 mg by mouth daily with breakfast.      . fish oil-omega-3 fatty acids 1000 MG capsule Take 1 g by mouth every morning.       . Multiple Vitamins-Minerals (MULTIVITAMIN WITH MINERALS) tablet Take 1 tablet by mouth daily.      . nitrofurantoin, macrocrystal-monohydrate, (MACROBID) 100 MG capsule Take 100 mg by mouth at bedtime.      . tamoxifen (NOLVADEX) 10 MG tablet Take 20 mg by mouth every morning.       . triamterene-hydrochlorothiazide (DYAZIDE) 37.5-25 MG per capsule Take 1 capsule by mouth every morning.      . vitamin C (ASCORBIC ACID) 500 MG tablet Take 1,000 mg by mouth daily.      . vitamin E 400 UNIT capsule Take 400 Units by mouth every morning.       . zolpidem (AMBIEN) 5 MG tablet Take 5 mg by mouth at bedtime as needed. For sleep         Results for orders placed during the hospital encounter of 04/17/12 (from the past 48 hour(s))  CBC WITH DIFFERENTIAL     Status: Abnormal   Collection Time   04/17/12 11:10 AM      Component Value Range Comment   WBC 11.8 (*) 4.0 - 10.5 K/uL    RBC 4.98  3.87 - 5.11 MIL/uL    Hemoglobin 15.0  12.0 - 15.0 g/dL    HCT 42.7  36.0 - 46.0 %    MCV 85.7  78.0 - 100.0 fL    MCH 30.1  26.0 - 34.0 pg    MCHC 35.1  30.0 - 36.0 g/dL    RDW 12.7  11.5 - 15.5 %    Platelets 310  150 - 400 K/uL    Neutrophils Relative 93 (*) 43 - 77 %    Neutro Abs 11.0 (*) 1.7 - 7.7 K/uL    Lymphocytes Relative 4 (*) 12 - 46 %    Lymphs Abs 0.4 (*) 0.7 - 4.0 K/uL    Monocytes Relative 3  3 - 12 %    Monocytes Absolute 0.4  0.1 - 1.0 K/uL    Eosinophils Relative 0  0 - 5 %    Eosinophils Absolute 0.0  0.0 -  0.7 K/uL    Basophils Relative 0  0 - 1 %    Basophils Absolute 0.0  0.0 - 0.1 K/uL    Dg Abd Acute W/chest  04/17/2012  *RADIOLOGY REPORT*  Clinical Data: Vomiting  ACUTE ABDOMEN SERIES (ABDOMEN 2 VIEW & CHEST 1 VIEW)  Comparison: 08/08/2011 and 05/24/2011  Findings: Cardiomediastinal silhouette is stable.  No acute infiltrate or pleural effusion.  No pulmonary edema.  Levoscoliosis of the lumbar spine is noted.  Distended small bowel loops right abdomen with multiple air-fluid levels highly suspicious for bowel obstruction.  No free abdominal air.  IMPRESSION: No acute disease within the chest.  Distended small bowel loops right abdomen with air-fluid levels highly suspicious for small bowel obstruction.   Original Report Authenticated By: Lahoma Crocker, M.D.     Review of Systems  Constitutional: Negative for fever and chills.  HENT: Negative.   Eyes: Negative.   Respiratory: Negative.   Cardiovascular: Negative.   Gastrointestinal: Positive for nausea, vomiting and abdominal pain. Negative for diarrhea, constipation and blood in stool.  Genitourinary: Negative.   Musculoskeletal: Negative.   Neurological: Negative for weakness.  Psychiatric/Behavioral: Negative.     Blood pressure 114/56, pulse 117, temperature 98 F (36.7 C), temperature source Oral, resp. rate 18, SpO2 96.00%. Physical Exam  General: Alert pleasant Caucasian female who does not appear in distress Skin: Warm and dry without rash or infection HEENT: No palpable masses or thyromegaly. NG tube is in place. Oropharynx clear. Sclera nonicteric. Lymph nodes: No cervical, supraclavicular, axillary or inguinal nodes palpable Breasts: Healed lumpectomy site on the left. No palpable masses or other abnormalities Respiratory: Clear equal breath sounds bilaterally Cardiac: Regular rate and rhythm without murmur. No edema. No JVD. Peripheral pulses intact Abdomen: Long healed midline incision. Moderate distention. No palpable  hernias in the incision or groins. Healthy-appearing ileostomy in the right lower quadrant. There is actually a small amount of stool and gas at exam that is new. There is mild diffuse tenderness without guarding and no discernible masses or organomegaly. Extremities: No edema or joint swelling or deformity Neurologic: She is alert and fully oriented. Affect normal. No gross motor deficits.  Assessment/Plan Recurrent small bowel obstruction history of total colectomy and ileostomy. NG tube has been placed. She will be admitted for initially nonoperative management and close observation. She does have a little stool and gas per ileostomy since she arrived and hopefully this will continue.  Rashelle Ireland T 04/17/2012, 11:57 AM

## 2012-04-17 NOTE — ED Notes (Signed)
NG inserted into R nare w/o complications. Pt tolerated well. Placement verified x2 RN. Connected to LWS intermitt. Immediate return of 256m of greenish colored content. Pt states she "is feeling better already". Tube secured in place and pinned to gown for extra support.

## 2012-04-17 NOTE — ED Notes (Signed)
Pt reports hx of illeostomy x40 years. Pt has hx of small bowel obstruction, last one in April 2013. Pt reports decreased stool output from ileostomy starting yesterday, abdominal pain/cramping 8/10. Pt vomited x4 brown emesis.

## 2012-04-17 NOTE — Progress Notes (Signed)
Patient resting, no pain associated with NG tube, changed back to intermittent suction as ordered, Will continue to monitor

## 2012-04-18 ENCOUNTER — Inpatient Hospital Stay (HOSPITAL_COMMUNITY): Payer: PRIVATE HEALTH INSURANCE

## 2012-04-18 LAB — CBC
HCT: 35.9 % — ABNORMAL LOW (ref 36.0–46.0)
Hemoglobin: 12.3 g/dL (ref 12.0–15.0)
MCV: 89.3 fL (ref 78.0–100.0)
RBC: 4.02 MIL/uL (ref 3.87–5.11)
WBC: 4.1 10*3/uL (ref 4.0–10.5)

## 2012-04-18 LAB — BASIC METABOLIC PANEL
CO2: 30 mEq/L (ref 19–32)
Chloride: 105 mEq/L (ref 96–112)
Sodium: 141 mEq/L (ref 135–145)

## 2012-04-18 NOTE — Progress Notes (Signed)
ATTENDING ADDENDUM:  I personally reviewed patient's record, examined the patient, and formulated the following plan:  Doing better.  Slight stool and gas in bag.  Min NG output.  Continue medical management with NPO and NGT

## 2012-04-18 NOTE — Progress Notes (Signed)
Patient ID: Deanna Burke, female   DOB: 1950/10/04, 61 y.o.   MRN: [id number]    Subjective: Pt feels better.  Less pain.  Small amount of air in bag.  Objective: Vital signs in last 24 hours: Temp:  [97.9 F (36.6 C)-98.6 F (37 C)] 98.1 F (36.7 C) (12/26 1015) Pulse Rate:  [51-98] 80  (12/26 1015) Resp:  [16-18] 18  (12/26 1015) BP: (111-138)/(55-81) 117/63 mmHg (12/26 1015) SpO2:  [90 %-99 %] 91 % (12/26 1015) Weight:  [167 lb 12.3 oz (76.1 kg)] 167 lb 12.3 oz (76.1 kg) (12/25 1813) Last BM Date: 04/17/12  Intake/Output from previous day: 12/25 0701 - 12/26 0700 In: 3789.6 [I.V.:3089.6; NG/GT:700] Out: 315 [Emesis/NG output:315] Intake/Output this shift:    PE: Abd: soft, minimally tender, +BS, ostomy with small amount of air, no other real output, NGT with some watered-down bilious output.  Lab Results:   Basename 04/18/12 0508 04/17/12 1110  WBC 4.1 11.8*  HGB 12.3 15.0  HCT 35.9* 42.7  PLT 208 310   BMET  Basename 04/18/12 0508 04/17/12 1110  NA 141 138  K 3.3* 3.0*  CL 105 96  CO2 30 21  GLUCOSE 135* 134*  BUN 19 31*  CREATININE 1.10 1.28*  CALCIUM 8.5 10.4   PT/INR No results found for this basename: LABPROT:2,INR:2 in the last 72 hours CMP     Component Value Date/Time   NA 141 04/18/2012 0508   K 3.3* 04/18/2012 0508   CL 105 04/18/2012 0508   CO2 30 04/18/2012 0508   GLUCOSE 135* 04/18/2012 0508   BUN 19 04/18/2012 0508   CREATININE 1.10 04/18/2012 0508   CALCIUM 8.5 04/18/2012 0508   PROT 8.2 04/17/2012 1110   ALBUMIN 3.8 04/17/2012 1110   AST 23 04/17/2012 1110   ALT 15 04/17/2012 1110   ALKPHOS 64 04/17/2012 1110   BILITOT 1.3* 04/17/2012 1110   GFRNONAA 52* 04/18/2012 0508   GFRAA 61* 04/18/2012 0508   Lipase     Component Value Date/Time   LIPASE 52 04/17/2012 1110       Studies/Results: Dg Abd Acute W/chest  04/17/2012  *RADIOLOGY REPORT*  Clinical Data: Vomiting  ACUTE ABDOMEN SERIES (ABDOMEN 2 VIEW & CHEST 1  VIEW)  Comparison: 08/08/2011 and 05/24/2011  Findings: Cardiomediastinal silhouette is stable.  No acute infiltrate or pleural effusion.  No pulmonary edema.  Levoscoliosis of the lumbar spine is noted.  Distended small bowel loops right abdomen with multiple air-fluid levels highly suspicious for bowel obstruction.  No free abdominal air.  IMPRESSION: No acute disease within the chest.  Distended small bowel loops right abdomen with air-fluid levels highly suspicious for small bowel obstruction.   Original Report Authenticated By: Lahoma Crocker, M.D.    Dg Abd Portable 2v  04/18/2012  *RADIOLOGY REPORT*  Clinical Data: Evaluate small bowel obstruction  PORTABLE ABDOMEN - 2 VIEW  Comparison: 04/17/2012; 08/08/2011  Findings:  Increased gaseous distension of several loops of small bowel within the right mid and lower abdominal quadrant with index loop now measuring approximately 4.5 cm in diameter.  A moderate amount of stool is seen within the descending colon, however there is a paucity of distal colonic gas.   Enteric tube tip and side port overlying the gastric fundus.  No definite pneumoperitoneum, pneumatosis or portal venous gas.  Post cholecystectomy.  Multilevel lumbar spine degenerative change.  IMPRESSION: Overall findings suggestive of worsening at least partial small bowel obstruction.  Continued attention on follow-up is recommended.  Original Report Authenticated By: Jake Seats, MD     Anti-infectives: Anti-infectives    None       Assessment/Plan  1. PSBO 2. Multiple abdominal operations  Plan: 1. Patient would like to avoid an operation if at all possible.   2. Her x-rays today appear maybe slightly worse, but she clinically feels better.  Cont conservative treatment for now with bowel rest and NGT. 3. Recheck abdominal films in am.   LOS: 1 day    Randye Treichler E 04/18/2012, 11:22 AM Pager: [phone]

## 2012-04-19 ENCOUNTER — Inpatient Hospital Stay (HOSPITAL_COMMUNITY): Payer: PRIVATE HEALTH INSURANCE

## 2012-04-19 DIAGNOSIS — E876 Hypokalemia: Secondary | ICD-10-CM

## 2012-04-19 MED ORDER — CIPROFLOXACIN IN D5W 400 MG/200ML IV SOLN
400.0000 mg | Freq: Two times a day (BID) | INTRAVENOUS | Status: DC
  Administered 2012-04-19 – 2012-04-22 (×7): 400 mg via INTRAVENOUS
  Filled 2012-04-19 (×8): qty 200

## 2012-04-19 MED ORDER — POTASSIUM CHLORIDE 10 MEQ/100ML IV SOLN
10.0000 meq | INTRAVENOUS | Status: AC
  Administered 2012-04-19 (×4): 10 meq via INTRAVENOUS
  Filled 2012-04-19 (×4): qty 100

## 2012-04-19 NOTE — Progress Notes (Signed)
Patient ID: Deanna Burke, female   DOB: 01-30-1951, 61 y.o.   MRN: [id number]    Subjective: Pt feels better today.  Had some air and stool in bag this morning  Objective: Vital signs in last 24 hours: Temp:  [97.5 F (36.4 C)-98.8 F (37.1 C)] 98.5 F (36.9 C) 2023/05/17 0625) Pulse Rate:  [80-88] 82  05/17/2023 0625) Resp:  [18] 18  2023-05-17 0625) BP: (117-145)/(63-82) 127/75 mmHg 05-17-23 0625) SpO2:  [91 %-98 %] 96 % 05-17-2023 0625) Last BM Date: 04/17/12  Intake/Output from previous day: 12/26 0701 - 17-May-2023 0700 In: 3114.6 [I.V.:3114.6] Out: 3250 [Urine:1100; Emesis/NG output:1950; Stool:200] Intake/Output this shift:    PE: Abd: soft, NT, ostomy with some air and semisolid stool. Heart: regular  Lab Results:   Basename 04/18/12 0508 04/17/12 1110  WBC 4.1 11.8*  HGB 12.3 15.0  HCT 35.9* 42.7  PLT 208 310   BMET  Basename 04/18/12 0508 04/17/12 1110  NA 141 138  K 3.3* 3.0*  CL 105 96  CO2 30 21  GLUCOSE 135* 134*  BUN 19 31*  CREATININE 1.10 1.28*  CALCIUM 8.5 10.4   PT/INR No results found for this basename: LABPROT:2,INR:2 in the last 72 hours CMP     Component Value Date/Time   NA 141 04/18/2012 0508   K 3.3* 04/18/2012 0508   CL 105 04/18/2012 0508   CO2 30 04/18/2012 0508   GLUCOSE 135* 04/18/2012 0508   BUN 19 04/18/2012 0508   CREATININE 1.10 04/18/2012 0508   CALCIUM 8.5 04/18/2012 0508   PROT 8.2 04/17/2012 1110   ALBUMIN 3.8 04/17/2012 1110   AST 23 04/17/2012 1110   ALT 15 04/17/2012 1110   ALKPHOS 64 04/17/2012 1110   BILITOT 1.3* 04/17/2012 1110   GFRNONAA 52* 04/18/2012 0508   GFRAA 61* 04/18/2012 0508   Lipase     Component Value Date/Time   LIPASE 52 04/17/2012 1110       Studies/Results: Dg Abd 2 Views  05/16/2012  *RADIOLOGY REPORT*  Clinical Data: Abdominal discomfort.  Evaluate partial small bowel obstruction.  ABDOMEN - 2 VIEW  Comparison: 04/18/2012.  Findings: Nasogastric tube terminates in the stomach with the side  port in the region of the gastroesophageal junction.  There are persistent dilated loops of small bowel with associated air fluid levels.  Stool is seen in the region of the descending colon. Postoperative changes are seen in the anatomic pelvis.  Possible colonic gas in the right upper quadrant.  IMPRESSION: Persistent small bowel obstructive pattern, possibly partial in nature.  Nasogastric tube could be advanced 6-7 cm to better position the side port in the stomach, as clinically indicated.   Original Report Authenticated By: Lorin Picket, M.D.    Dg Abd Acute W/chest  04/17/2012  *RADIOLOGY REPORT*  Clinical Data: Vomiting  ACUTE ABDOMEN SERIES (ABDOMEN 2 VIEW & CHEST 1 VIEW)  Comparison: 08/08/2011 and 05/24/2011  Findings: Cardiomediastinal silhouette is stable.  No acute infiltrate or pleural effusion.  No pulmonary edema.  Levoscoliosis of the lumbar spine is noted.  Distended small bowel loops right abdomen with multiple air-fluid levels highly suspicious for bowel obstruction.  No free abdominal air.  IMPRESSION: No acute disease within the chest.  Distended small bowel loops right abdomen with air-fluid levels highly suspicious for small bowel obstruction.   Original Report Authenticated By: Lahoma Crocker, M.D.    Dg Abd Portable 2v  04/18/2012  *RADIOLOGY REPORT*  Clinical Data: Evaluate small bowel obstruction  PORTABLE  ABDOMEN - 2 VIEW  Comparison: 04/17/2012; 08/08/2011  Findings:  Increased gaseous distension of several loops of small bowel within the right mid and lower abdominal quadrant with index loop now measuring approximately 4.5 cm in diameter.  A moderate amount of stool is seen within the descending colon, however there is a paucity of distal colonic gas.   Enteric tube tip and side port overlying the gastric fundus.  No definite pneumoperitoneum, pneumatosis or portal venous gas.  Post cholecystectomy.  Multilevel lumbar spine degenerative change.  IMPRESSION: Overall findings  suggestive of worsening at least partial small bowel obstruction.  Continued attention on follow-up is recommended.   Original Report Authenticated By: Jake Seats, MD     Anti-infectives: Anti-infectives    None       Assessment/Plan 1. PSBO 2. H/o subtotal colectomy, ileostomy in the 1970's 3. Chronic UTI 4. Hypokalemia  Plan: 1. Cont current management with NGT.  Suspect patient is slowly improving.  Will check xrays again in the morning and follow. 2.  Replace K+ and check BMET in am 3. D/w pharmacy an IV equivalent to macrodantin to continue treating the patient's chronic UTIs.  LOS: 2 days    OSBORNE,KELLY E 04/19/2012, 9:43 AM Pager: [phone]  Doesn't feel great, but feels better than on admission.  She's had a little out ostomy bag. She does have some "fecalization" in the left side of her abdomen on the KUB. Sister and brother in room.   Alphonsa Overall, MD, [facility] Surgery Pager: [phone] Office phone:  [phone]

## 2012-04-19 NOTE — Progress Notes (Signed)
Ng tube advanced this am as directed by PA and xray report.

## 2012-04-20 ENCOUNTER — Inpatient Hospital Stay (HOSPITAL_COMMUNITY): Payer: PRIVATE HEALTH INSURANCE

## 2012-04-20 LAB — BASIC METABOLIC PANEL
BUN: 14 mg/dL (ref 6–23)
Calcium: 8.8 mg/dL (ref 8.4–10.5)
GFR calc Af Amer: 65 mL/min — ABNORMAL LOW (ref >90)
GFR calc non Af Amer: 56 mL/min — ABNORMAL LOW (ref >90)
Potassium: 3.2 mEq/L — ABNORMAL LOW (ref 3.5–5.1)
Sodium: 140 mEq/L (ref 135–145)

## 2012-04-20 MED ORDER — POTASSIUM CHLORIDE 10 MEQ/100ML IV SOLN
10.0000 meq | INTRAVENOUS | Status: AC
  Administered 2012-04-20 (×2): 10 meq via INTRAVENOUS
  Filled 2012-04-20 (×2): qty 100

## 2012-04-20 MED ORDER — POTASSIUM CHLORIDE 10 MEQ/100ML IV SOLN
10.0000 meq | INTRAVENOUS | Status: AC
  Administered 2012-04-20 (×2): 10 meq via INTRAVENOUS
  Filled 2012-04-20 (×4): qty 100

## 2012-04-20 NOTE — Progress Notes (Signed)
General Surgery Note  LOS: 3 days  Room - 1518  Assessment/Plan: 1.  Small bowel obstruction  She has passed some small stool in her ostomy, but she is uncomfortable in her left mid abdomen.  KUB pending.  Progressing, maybe.  2. History of total colectomy in the 1970's  At least one exploration for SBO around 2000 3. Hypokalemia - K+ - 3.2 - 04/20/2012  To replace 4.  DVT prophylaxis - SQ Heparin 5.  Limited IV access - plan PICC 6.  History of left breast DCIS- Dr. Magrinat/[facility] treating physicians.  Talked about Tamoxifen.  Subjective:  She has passed some small stool in her ostomy, but she is uncomfortable in her left mid abdomen.  She did get out and walk some yesterday.  Objective:   Filed Vitals:   04/20/12 0600  BP: 134/80  Pulse: 86  Temp: 97.7 F (36.5 C)  Resp: 18     Intake/Output from previous day:  12/27 0701 - 12/28 0700 In: 3864.6 [P.O.:150; I.V.:3014.6; IV Piggyback:700] Out: 2900 [Urine:1100; Emesis/NG output:1800]  Intake/Output this shift:      Physical Exam:   General: WN older WF who is alert and oriented.    HEENT: Normal. Pupils equal. .   Lungs: Clear   Abdomen: Tighter on left abdomen, but has BS.  No guarding or rebound.   Lab Results:    Basename 04/18/12 0508 04/17/12 1110  WBC 4.1 11.8*  HGB 12.3 15.0  HCT 35.9* 42.7  PLT 208 310    BMET   Basename 04/20/12 0533 04/18/12 0508  NA 140 141  K 3.2* 3.3*  CL 105 105  CO2 26 30  GLUCOSE 110* 135*  BUN 14 19  CREATININE 1.04 1.10  CALCIUM 8.8 8.5    PT/INR  No results found for this basename: LABPROT:2,INR:2 in the last 72 hours  ABG  No results found for this basename: PHART:2,PCO2:2,PO2:2,HCO3:2 in the last 72 hours   Studies/Results:  Dg Abd 2 Views  04/19/2012  *RADIOLOGY REPORT*  Clinical Data: Abdominal discomfort.  Evaluate partial small bowel obstruction.  ABDOMEN - 2 VIEW  Comparison: 04/18/2012.  Findings: Nasogastric tube terminates in the stomach  with the side port in the region of the gastroesophageal junction.  There are persistent dilated loops of small bowel with associated air fluid levels.  Stool is seen in the region of the descending colon. Postoperative changes are seen in the anatomic pelvis.  Possible colonic gas in the right upper quadrant.  IMPRESSION: Persistent small bowel obstructive pattern, possibly partial in nature.  Nasogastric tube could be advanced 6-7 cm to better position the side port in the stomach, as clinically indicated.   Original Report Authenticated By: Lorin Picket, M.D.      Anti-infectives:   Anti-infectives     Start     Dose/Rate Route Frequency Ordered Stop   04/19/12 1100   ciprofloxacin (CIPRO) IVPB 400 mg        400 mg 200 mL/hr over 60 Minutes Intravenous Every 12 hours 04/19/12 0957            Alphonsa Overall, MD, FACS Pager: [phone],   [facility] Office: [phone] 04/20/2012

## 2012-04-20 NOTE — Significant Event (Signed)
Deanna Burke is passing large amounts of charcoal colored stool... No pain with passage.Marland KitchenMarland KitchenPatient emptied bag herself.Marland KitchenMarland Kitchen

## 2012-04-21 ENCOUNTER — Inpatient Hospital Stay (HOSPITAL_COMMUNITY): Payer: PRIVATE HEALTH INSURANCE

## 2012-04-21 LAB — BASIC METABOLIC PANEL
BUN: 17 mg/dL (ref 6–23)
Chloride: 104 mEq/L (ref 96–112)
GFR calc Af Amer: 66 mL/min — ABNORMAL LOW (ref >90)
GFR calc non Af Amer: 57 mL/min — ABNORMAL LOW (ref >90)
Potassium: 3.5 mEq/L (ref 3.5–5.1)
Sodium: 142 mEq/L (ref 135–145)

## 2012-04-21 LAB — CBC WITH DIFFERENTIAL/PLATELET
Basophils Absolute: 0 10*3/uL (ref 0.0–0.1)
Basophils Relative: 0 % (ref 0–1)
Hemoglobin: 12 g/dL (ref 12.0–15.0)
MCHC: 32.9 g/dL (ref 30.0–36.0)
Monocytes Relative: 8 % (ref 3–12)
Neutro Abs: 3.1 10*3/uL (ref 1.7–7.7)
Neutrophils Relative %: 76 % (ref 43–77)
Platelets: 213 10*3/uL (ref 150–400)

## 2012-04-21 NOTE — Progress Notes (Signed)
  Subjective: She is feeling well. No pain or nausea.  Ostomy started pouring out last night.  Objective: Vital signs in last 24 hours: Temp:  [98.2 F (36.8 C)-98.6 F (37 C)] 98.6 F (37 C) (12/29 0600) Pulse Rate:  [85-90] 85  (12/29 0600) Resp:  [18] 18  (12/29 0600) BP: (111-129)/(69-75) 111/69 mmHg (12/29 0600) SpO2:  [97 %-99 %] 97 % (12/29 0600) Last BM Date: 04/17/12  Intake/Output from previous day: 2023/04/23 0701 - 12/29 0700 In: 2950 [I.V.:2750; IV Piggyback:200] Out: 6300 [Urine:1150; Emesis/NG output:3350; Stool:1800] Intake/Output this shift:    General appearance: alert, cooperative and no distress GI: soft, NT, ND, ostomy with gas and dark liquid  Lab Results:   [facility] 04/21/12 0533  WBC 4.1  HGB 12.0  HCT 36.5  PLT 213   BMET  Basename 04/21/12 0533 Apr 22, 2012 0533  NA 142 140  K 3.5 3.2*  CL 104 105  CO2 25 26  GLUCOSE 109* 110*  BUN 17 14  CREATININE 1.03 1.04  CALCIUM 8.7 8.8   PT/INR No results found for this basename: LABPROT:2,INR:2 in the last 72 hours ABG No results found for this basename: PHART:2,PCO2:2,PO2:2,HCO3:2 in the last 72 hours  Studies/Results: Dg Abd 2 Views  04-22-2012  *RADIOLOGY REPORT*  Clinical Data: Small bowel obstruction  ABDOMEN - 2 VIEW  Comparison: Plain film 04/19/2012  Findings: NG tube extends in the stomach.  Side port at the GE junction.  No free air beneath hemidiaphragms.  No dilated loops of large or small bowel.  Interval decreasing gas distention of the small bowel compared to prior.  There is gas  in the rectum. Surgical clips are noted within the pelvis.  IMPRESSION: Improvement in bowel obstruction pattern.  No intraperitoneal free air.   Original Report Authenticated By: Suzy Bouchard, M.D.     Anti-infectives: Anti-infectives     Start     Dose/Rate Route Frequency Ordered Stop   04/19/12 1100   ciprofloxacin (CIPRO) IVPB 400 mg        400 mg 200 mL/hr over 60 Minutes Intravenous Every 12  hours 04/19/12 0957            Assessment/Plan: s/p * No surgery found * she looks good and ostomy functioning.  will trial clamping the tube and liquids later today if tolerating.  advance diet as tolerated.  LOS: 4 days    Deanna Burke DAVID 04/21/2012

## 2012-04-22 MED ORDER — NITROFURANTOIN MONOHYD MACRO 100 MG PO CAPS
100.0000 mg | ORAL_CAPSULE | Freq: Every day | ORAL | Status: DC
  Administered 2012-04-22: 100 mg via ORAL
  Filled 2012-04-22 (×2): qty 1

## 2012-04-22 NOTE — Progress Notes (Signed)
Removed NG tube after pt tolerated full liquid meal. Pt tolerated removal well.

## 2012-04-22 NOTE — Progress Notes (Signed)
Agree with above.  DOing well. Adv diet Hopefully home in next day or so when tol PO

## 2012-04-22 NOTE — Progress Notes (Signed)
Patient ID: Deanna Burke, female   DOB: 1950/12/19, 61 y.o.   MRN: [id number]    Subjective: Pt feeling well.  Tolerating clears.  Passing flatus and stool in bag.  Objective: Vital signs in last 24 hours: Temp:  [97.8 F (36.6 C)-98.2 F (36.8 C)] 97.8 F (36.6 C) (12/30 0600) Pulse Rate:  [80-95] 95  (12/30 0600) Resp:  [18] 18  (12/30 0600) BP: (116-125)/(53-72) 116/53 mmHg (12/30 0600) SpO2:  [93 %-100 %] 93 % (12/30 0600) Last BM Date: 04/20/12  Intake/Output from previous day: Apr 28, 2023 0701 - 12/30 0700 In: 2501.3 [I.V.:1901.3; IV Piggyback:600] Out: 2400 [Urine:600; Stool:1800] Intake/Output this shift: Total I/O In: -  Out: 150 [Urine:100; Stool:50]  PE: Abd: soft, air and stool in ileostomy, ND  Lab Results:   [facility] 04/27/2012 0533  WBC 4.1  HGB 12.0  HCT 36.5  PLT 213   BMET  Basename 2012/04/27 0533 04/20/12 0533  NA 142 140  K 3.5 3.2*  CL 104 105  CO2 25 26  GLUCOSE 109* 110*  BUN 17 14  CREATININE 1.03 1.04  CALCIUM 8.7 8.8   PT/INR No results found for this basename: LABPROT:2,INR:2 in the last 72 hours CMP     Component Value Date/Time   NA 142 04-27-12 0533   K 3.5 04/27/12 0533   CL 104 04/27/2012 0533   CO2 25 04/27/2012 0533   GLUCOSE 109* 27-Apr-2012 0533   BUN 17 Apr 27, 2012 0533   CREATININE 1.03 04-27-12 0533   CALCIUM 8.7 2012-04-27 0533   PROT 8.2 04/17/2012 1110   ALBUMIN 3.8 04/17/2012 1110   AST 23 04/17/2012 1110   ALT 15 04/17/2012 1110   ALKPHOS 64 04/17/2012 1110   BILITOT 1.3* 04/17/2012 1110   GFRNONAA 57* 04/27/2012 0533   GFRAA 66* 04-27-2012 0533   Lipase     Component Value Date/Time   LIPASE 52 04/17/2012 1110       Studies/Results: Dg Abd 2 Views  04/27/12  *RADIOLOGY REPORT*  Clinical Data: Follow up small bowel obstruction  ABDOMEN - 2 VIEW  Comparison: 04/20/2012  Findings: There is a nasogastric tube with tip in the stomach.  Air- filled loops of small bowel and air-fluid levels are  noted and appear decreased from previous exam.  No new findings.  IMPRESSION:  1.  Improving bowel obstruction pattern.   Original Report Authenticated By: Kerby Moors, M.D.    Dg Abd 2 Views  04/20/2012  *RADIOLOGY REPORT*  Clinical Data: Small bowel obstruction  ABDOMEN - 2 VIEW  Comparison: Plain film 04/19/2012  Findings: NG tube extends in the stomach.  Side port at the GE junction.  No free air beneath hemidiaphragms.  No dilated loops of large or small bowel.  Interval decreasing gas distention of the small bowel compared to prior.  There is gas  in the rectum. Surgical clips are noted within the pelvis.  IMPRESSION: Improvement in bowel obstruction pattern.  No intraperitoneal free air.   Original Report Authenticated By: Suzy Bouchard, M.D.     Anti-infectives: Anti-infectives     Start     Dose/Rate Route Frequency Ordered Stop   04/19/12 1100   ciprofloxacin (CIPRO) IVPB 400 mg  Status:  Discontinued        400 mg 200 mL/hr over 60 Minutes Intravenous Every 12 hours 04/19/12 0957 04/22/12 1218           Assessment/Plan  1. SBO, resolving  Plan: 1. Will advance to full liquids 2. Dc  ngt 3. Resume home dose of macrodantin for chronic UTIs   LOS: 5 days    Rosalynn Sergent E 04/22/2012, 12:19 PM Pager: [phone]

## 2012-04-23 NOTE — Discharge Summary (Signed)
Patient ID: BURKLEY DECH MRN: [id number] DOB/AGE: 11-18-1950 61 y.o.  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 She has had several abdominal procedures since. She had a laparotomy for small bowel obstruction in the 1970s and had a small bowel resection by me in the 1990s. She has had several episodes of small bowel obstruction previously that have resolved nonoperatively. The last time was in April of this year. This resolved without surgery and she has done well until yesterday. She developed initially abdominal swelling and crampy diffuse pain followed by several episodes of vomiting which were green and then feculent. She states she has not had ileostomy output since yesterday afternoon. She has chronic urinary infections but no current symptoms. No fever or chills. No blood per stoma.  Admission Diagnoses:  1. SBO Patient Active Problem List  Diagnosis  . DCIS (ductal carcinoma in situ) of breast, treated with lumpectomy and radiation  . Malignant neoplasm of upper-outer quadrant of female breast  . Small bowel obstruction, resolved  . 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 Cancer  . Breast cancer  . 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 Abnormal finding on cardiovascular stress test, ischemia anterolateral  . Syncope, ? anginal equivilant  . Ileostomy in place, secondary to ulcerative colitis x 20 years  . History of recurrent UTIs    Hospital Course: the patient was admitted and placed on bowel rest and NGT was placed for decompression.  She started passing some flatus out of her bag on HD#4, her NGT was clamped and she was started on clear liquids.  She was advanced to fulls the next day and then her NGT was removed, per patient request.  She tolerated the full  liquids and her diet was advanced to solid.  She was otherwise felt stable for dc home as her SBO had resolved.  Discharge Diagnoses:  1. SBO, resolved Patient Active Problem List  Diagnosis  . DCIS (ductal carcinoma in situ) of breast, treated with lumpectomy and radiation  . Malignant neoplasm of upper-outer quadrant of female breast  . Small bowel obstruction, resolved  . 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 Cancer  . Breast cancer  . 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 Abnormal finding on cardiovascular stress test, ischemia anterolateral  . Syncope, ? anginal equivilant  . Ileostomy in place, secondary to ulcerative colitis x 20 years  . History of recurrent UTIs    Discharge Medications:   Medication List     As of 04/23/2012  9:22 AM    TAKE these medications         aspirin 325 MG tablet   Take 325 mg by mouth at bedtime.      calcium carbonate 600 MG Tabs   Commonly known as: OS-CAL   Take 600 mg by mouth 2 (two) times daily with a meal.      DEVROM 200 MG Chew   Generic drug: Bismuth Subgallate   Chew 400 mg by mouth 4 (four) times daily.      diclofenac sodium 1 % Gel   Commonly known as: VOLTAREN   Apply 2 g topically 4 (four) times daily as needed. For shoulder/ joint pain      ferrous sulfate 325 (65 FE) MG tablet  Take 325 mg by mouth daily with breakfast.      fish oil-omega-3 fatty acids 1000 MG capsule   Take 1 g by mouth every morning.      multivitamin with minerals tablet   Take 1 tablet by mouth daily.      nitrofurantoin (macrocrystal-monohydrate) 100 MG capsule   Commonly known as: MACROBID   Take 100 mg by mouth at bedtime.      tamoxifen 10 MG tablet   Commonly known as: NOLVADEX   Take 20 mg by mouth every morning.      triamterene-hydrochlorothiazide 37.5-25 MG per capsule   Commonly known as: DYAZIDE   Take 1 capsule by mouth every morning.      vitamin C 500 MG tablet   Commonly known as: ASCORBIC  ACID   Take 1,000 mg by mouth daily.      vitamin E 400 UNIT capsule   Take 400 Units by mouth every morning.      zolpidem 5 MG tablet   Commonly known as: AMBIEN   Take 5 mg by mouth at bedtime as needed. For sleep        Discharge Instructions:     Follow-up Information    Follow up with [name], MD. (As needed)    Contact information:   [address] [place] [address]          Signed: Henreitta Cea 04/23/2012, 9:22 AM

## 2012-04-23 NOTE — Progress Notes (Signed)
Pt tolerated regular diet lunch well.  Pt has no complaints of pain or nausea.  Pt being d/c'd today.

## 2012-06-03 ENCOUNTER — Ambulatory Visit: Payer: PRIVATE HEALTH INSURANCE | Admitting: Oncology

## 2012-06-04 ENCOUNTER — Telehealth: Payer: Self-pay | Admitting: Oncology

## 2012-06-04 NOTE — Telephone Encounter (Signed)
Sent letter to patient from Dr. Jana Hakim

## 2012-06-05 ENCOUNTER — Emergency Department (HOSPITAL_COMMUNITY): Payer: BC Managed Care – PPO

## 2012-06-05 ENCOUNTER — Telehealth: Payer: Self-pay | Admitting: Oncology

## 2012-06-05 ENCOUNTER — Telehealth: Payer: Self-pay | Admitting: *Deleted

## 2012-06-05 ENCOUNTER — Encounter (HOSPITAL_COMMUNITY): Payer: Self-pay

## 2012-06-05 ENCOUNTER — Inpatient Hospital Stay (HOSPITAL_COMMUNITY)
Admission: EM | Admit: 2012-06-05 | Discharge: 2012-06-15 | DRG: 304 | Disposition: A | Discharge status: Home / Self Care | Payer: BC Managed Care – PPO | Attending: Internal Medicine | Admitting: Internal Medicine

## 2012-06-05 DIAGNOSIS — E876 Hypokalemia: Secondary | ICD-10-CM

## 2012-06-05 DIAGNOSIS — Z87440 Personal history of urinary (tract) infections: Secondary | ICD-10-CM | POA: Diagnosis present

## 2012-06-05 DIAGNOSIS — N1330 Unspecified hydronephrosis: Secondary | ICD-10-CM

## 2012-06-05 DIAGNOSIS — N390 Urinary tract infection, site not specified: Secondary | ICD-10-CM

## 2012-06-05 DIAGNOSIS — N10 Acute tubulo-interstitial nephritis: Secondary | ICD-10-CM

## 2012-06-05 DIAGNOSIS — E871 Hypo-osmolality and hyponatremia: Secondary | ICD-10-CM

## 2012-06-05 DIAGNOSIS — K5190 Ulcerative colitis, unspecified, without complications: Secondary | ICD-10-CM | POA: Diagnosis present

## 2012-06-05 DIAGNOSIS — N179 Acute kidney failure, unspecified: Secondary | ICD-10-CM

## 2012-06-05 DIAGNOSIS — B964 Proteus (mirabilis) (morganii) as the cause of diseases classified elsewhere: Secondary | ICD-10-CM | POA: Diagnosis present

## 2012-06-05 DIAGNOSIS — R109 Unspecified abdominal pain: Secondary | ICD-10-CM

## 2012-06-05 DIAGNOSIS — I129 Hypertensive chronic kidney disease with stage 1 through stage 4 chronic kidney disease, or unspecified chronic kidney disease: Secondary | ICD-10-CM | POA: Diagnosis present

## 2012-06-05 DIAGNOSIS — N183 Chronic kidney disease, stage 3 unspecified: Secondary | ICD-10-CM | POA: Diagnosis present

## 2012-06-05 DIAGNOSIS — N135 Crossing vessel and stricture of ureter without hydronephrosis: Secondary | ICD-10-CM | POA: Diagnosis present

## 2012-06-05 DIAGNOSIS — N289 Disorder of kidney and ureter, unspecified: Secondary | ICD-10-CM

## 2012-06-05 LAB — CBC WITH DIFFERENTIAL/PLATELET
Eosinophils Absolute: 0 10*3/uL (ref 0.0–0.7)
Eosinophils Relative: 0 % (ref 0–5)
HCT: 34.8 % — ABNORMAL LOW (ref 36.0–46.0)
Hemoglobin: 12.2 g/dL (ref 12.0–15.0)
Lymphocytes Relative: 5 % — ABNORMAL LOW (ref 12–46)
Lymphs Abs: 0.3 10*3/uL — ABNORMAL LOW (ref 0.7–4.0)
MCH: 30.5 pg (ref 26.0–34.0)
MCV: 87 fL (ref 78.0–100.0)
Monocytes Absolute: 0.5 10*3/uL (ref 0.1–1.0)
Monocytes Relative: 8 % (ref 3–12)
Platelets: 227 10*3/uL (ref 150–400)
RBC: 4 MIL/uL (ref 3.87–5.11)
WBC: 6.5 10*3/uL (ref 4.0–10.5)

## 2012-06-05 LAB — URINE MICROSCOPIC-ADD ON

## 2012-06-05 LAB — COMPREHENSIVE METABOLIC PANEL
ALT: 9 U/L (ref 0–35)
BUN: 41 mg/dL — ABNORMAL HIGH (ref 6–23)
CO2: 21 mEq/L (ref 19–32)
Calcium: 9.7 mg/dL (ref 8.4–10.5)
GFR calc Af Amer: 45 mL/min — ABNORMAL LOW (ref >90)
GFR calc non Af Amer: 38 mL/min — ABNORMAL LOW (ref >90)
Glucose, Bld: 101 mg/dL — ABNORMAL HIGH (ref 70–99)
Sodium: 127 mEq/L — ABNORMAL LOW (ref 135–145)
Total Protein: 7.9 g/dL (ref 6.0–8.3)

## 2012-06-05 LAB — LIPASE, BLOOD: Lipase: 33 U/L (ref 11–59)

## 2012-06-05 LAB — URINALYSIS, ROUTINE W REFLEX MICROSCOPIC
Bilirubin Urine: NEGATIVE
Nitrite: NEGATIVE
Protein, ur: >300 mg/dL — AB
Urobilinogen, UA: 0.2 mg/dL (ref 0.0–1.0)

## 2012-06-05 MED ORDER — VANCOMYCIN HCL IN DEXTROSE 1-5 GM/200ML-% IV SOLN
1000.0000 mg | INTRAVENOUS | Status: DC
  Administered 2012-06-05 – 2012-06-07 (×3): 1000 mg via INTRAVENOUS
  Filled 2012-06-05 (×3): qty 200

## 2012-06-05 MED ORDER — SODIUM CHLORIDE 0.9 % IJ SOLN
3.0000 mL | Freq: Two times a day (BID) | INTRAMUSCULAR | Status: DC
  Administered 2012-06-05 – 2012-06-15 (×10): 3 mL via INTRAVENOUS

## 2012-06-05 MED ORDER — ASPIRIN EC 81 MG PO TBEC
81.0000 mg | DELAYED_RELEASE_TABLET | Freq: Every day | ORAL | Status: DC
  Administered 2012-06-05 – 2012-06-15 (×9): 81 mg via ORAL
  Filled 2012-06-05 (×11): qty 1

## 2012-06-05 MED ORDER — ACETAMINOPHEN 650 MG RE SUPP: 650.0000 mg | Freq: Four times a day (QID) | RECTAL | Status: DC | PRN

## 2012-06-05 MED ORDER — VITAMIN C 500 MG PO TABS
1000.0000 mg | ORAL_TABLET | Freq: Every day | ORAL | Status: DC
  Administered 2012-06-06 – 2012-06-15 (×7): 1000 mg via ORAL
  Filled 2012-06-05 (×10): qty 2

## 2012-06-05 MED ORDER — GENTAMICIN SULFATE 40 MG/ML IJ SOLN
130.0000 mg | Freq: Once | INTRAVENOUS | Status: AC
  Administered 2012-06-05: 130 mg via INTRAVENOUS
  Filled 2012-06-05: qty 3.25

## 2012-06-05 MED ORDER — SODIUM CHLORIDE 0.9 % IV SOLN
Freq: Once | INTRAVENOUS | Status: AC
  Administered 2012-06-05: 19:00:00 via INTRAVENOUS

## 2012-06-05 MED ORDER — MORPHINE SULFATE 2 MG/ML IJ SOLN: 2.0000 mg | INTRAMUSCULAR | Status: DC | PRN

## 2012-06-05 MED ORDER — FERROUS SULFATE 325 (65 FE) MG PO TABS
325.0000 mg | ORAL_TABLET | Freq: Every day | ORAL | Status: DC
  Administered 2012-06-06 – 2012-06-15 (×10): 325 mg via ORAL
  Filled 2012-06-05 (×11): qty 1

## 2012-06-05 MED ORDER — ENOXAPARIN SODIUM 40 MG/0.4ML ~~LOC~~ SOLN
40.0000 mg | SUBCUTANEOUS | Status: DC
  Administered 2012-06-05 – 2012-06-14 (×10): 40 mg via SUBCUTANEOUS
  Filled 2012-06-05 (×11): qty 0.4

## 2012-06-05 MED ORDER — ONDANSETRON HCL 4 MG/2ML IJ SOLN
4.0000 mg | Freq: Once | INTRAMUSCULAR | Status: AC
  Administered 2012-06-05: 4 mg via INTRAVENOUS
  Filled 2012-06-05: qty 2

## 2012-06-05 MED ORDER — TAMOXIFEN CITRATE 10 MG PO TABS
20.0000 mg | ORAL_TABLET | Freq: Every morning | ORAL | Status: DC
  Administered 2012-06-06 – 2012-06-15 (×10): 20 mg via ORAL
  Filled 2012-06-05 (×11): qty 2

## 2012-06-05 MED ORDER — CALCIUM CARBONATE 600 MG PO TABS
600.0000 mg | ORAL_TABLET | Freq: Two times a day (BID) | ORAL | Status: DC
  Filled 2012-06-05 (×3): qty 1

## 2012-06-05 MED ORDER — SODIUM CHLORIDE 0.9 % IV BOLUS (SEPSIS)
1000.0000 mL | Freq: Once | INTRAVENOUS | Status: AC
  Administered 2012-06-05: 1000 mL via INTRAVENOUS

## 2012-06-05 MED ORDER — ADULT MULTIVITAMIN W/MINERALS CH
1.0000 | ORAL_TABLET | Freq: Every day | ORAL | Status: DC
  Administered 2012-06-06 – 2012-06-15 (×8): 1 via ORAL
  Filled 2012-06-05 (×10): qty 1

## 2012-06-05 MED ORDER — MORPHINE SULFATE 4 MG/ML IJ SOLN
4.0000 mg | Freq: Once | INTRAMUSCULAR | Status: AC
  Administered 2012-06-05: 4 mg via INTRAVENOUS
  Filled 2012-06-05: qty 1

## 2012-06-05 MED ORDER — ONDANSETRON HCL 4 MG PO TABS: 4.0000 mg | ORAL_TABLET | Freq: Four times a day (QID) | ORAL | Status: DC | PRN

## 2012-06-05 MED ORDER — BISMUTH SUBGALLATE 200 MG PO CHEW
400.0000 mg | CHEWABLE_TABLET | Freq: Four times a day (QID) | ORAL | Status: DC
  Administered 2012-06-06 – 2012-06-15 (×34): 400 mg via ORAL
  Filled 2012-06-05 (×5): qty 2

## 2012-06-05 MED ORDER — OXYCODONE HCL 5 MG PO TABS
5.0000 mg | ORAL_TABLET | ORAL | Status: DC | PRN
  Administered 2012-06-06 – 2012-06-11 (×6): 5 mg via ORAL
  Filled 2012-06-05 (×6): qty 1

## 2012-06-05 MED ORDER — POTASSIUM CHLORIDE CRYS ER 20 MEQ PO TBCR
40.0000 meq | EXTENDED_RELEASE_TABLET | Freq: Two times a day (BID) | ORAL | Status: DC
  Administered 2012-06-05 – 2012-06-13 (×15): 40 meq via ORAL
  Filled 2012-06-05 (×17): qty 2

## 2012-06-05 MED ORDER — ONDANSETRON HCL 4 MG/2ML IJ SOLN: 4.0000 mg | Freq: Four times a day (QID) | INTRAMUSCULAR | Status: DC | PRN

## 2012-06-05 MED ORDER — VITAMIN E 180 MG (400 UNIT) PO CAPS
400.0000 [IU] | ORAL_CAPSULE | Freq: Every morning | ORAL | Status: DC
  Administered 2012-06-06 – 2012-06-08 (×3): 400 [IU] via ORAL
  Filled 2012-06-05 (×10): qty 1

## 2012-06-05 MED ORDER — GENTAMICIN IN SALINE 1-0.9 MG/ML-% IV SOLN
100.0000 mg | Freq: Two times a day (BID) | INTRAVENOUS | Status: DC
  Administered 2012-06-06 – 2012-06-07 (×4): 100 mg via INTRAVENOUS
  Filled 2012-06-05 (×6): qty 100

## 2012-06-05 MED ORDER — OMEGA-3-ACID ETHYL ESTERS 1 G PO CAPS
1.0000 g | ORAL_CAPSULE | Freq: Every morning | ORAL | Status: DC
  Administered 2012-06-06 – 2012-06-08 (×3): 1 g via ORAL
  Filled 2012-06-05 (×10): qty 1

## 2012-06-05 MED ORDER — ACETAMINOPHEN 325 MG PO TABS
650.0000 mg | ORAL_TABLET | Freq: Four times a day (QID) | ORAL | Status: DC | PRN
  Administered 2012-06-06 – 2012-06-09 (×5): 650 mg via ORAL
  Filled 2012-06-05 (×5): qty 2

## 2012-06-05 MED ORDER — TRIAMTERENE-HCTZ 37.5-25 MG PO CAPS
1.0000 | ORAL_CAPSULE | Freq: Every day | ORAL | Status: DC
  Administered 2012-06-06: 1 via ORAL
  Filled 2012-06-05: qty 1

## 2012-06-05 NOTE — ED Notes (Signed)
Pt states that she found out Sunday that she had stage 3 kidney disease on the right.

## 2012-06-05 NOTE — ED Notes (Signed)
Pt in room resting w/o any s/s of distress or pain. Pt is alert and oriented x 4 and able to make need known. Will continue to monitor.

## 2012-06-05 NOTE — ED Notes (Addendum)
Has illeostomy and states that everything that comes out is liquid. Pt also states that she does not have an appetite

## 2012-06-05 NOTE — ED Provider Notes (Signed)
History     CSN: [id number]  Arrival date & time 06/05/12  33   First MD Initiated Contact with Patient 06/05/12 1355      Chief Complaint  Patient presents with  . Urinary Tract Infection  . Weakness  . Emesis    (Consider location/radiation/quality/duration/timing/severity/associated sxs/prior treatment) HPI Deanna Burke is a 62 y.o. female who presents to ED with recurrent UTIs. States has hx of UTIs, on constant macrobid once a day. States symptoms got worse 4 days ago. States having lower abdominal pain, bilateral flank pain, nausea, weakness. States she is not eating or drinking. States went to her doctor 3 days ago, was started on regular dose of macrobid, 146m twice a day. States it is not improving. Denies vomiting. States feels weak. Not eating or drinking. No changes in bowels.  Past Medical History  Diagnosis Date  . Ulcerative colitis [id number] . Pulmonary embolus 1971  . Phlebitis 1971  . Small bowel obstruction [id number] . Small bowel perforation 1971/1996  . Large bowel perforation 1971/1996  . Scoliosis   . Torn rotator cuff 2005  . Sleep apnea 2009  . History of bone density study 2012  . Fatigue   . Cataract     Left Eye  . Hot flashes   . 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 Hypertension   . Cancer     DCIS L breast  . H/O ulcerative colitis   . Breast cancer   . Arthritis     hands, lumbar spine, hips, right ankle  . S/P ileostomy   . S/P radiation therapy 07/24/11 - 09/06/11    Left Breast/ 5000 cGy in 25 Fractions with Boost of 1000 cGy in 5 Fractions  . 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 Abnormal finding on cardiovascular stress test, ischemia anterolateral 02/13/2012  . Syncope, ? anginal equivilant 02/13/2012  . Ileostomy in place, secondary to ulcerative colitis x 20 years 02/13/2012  . History of recurrent UTIs 02/13/2012    Past  Surgical History  Procedure Laterality Date  . Colectomy  1973  . Ileostomy  1973  . Vaginoplasty  1975  . Exploratory laparotomy  1978/1990    with lysis of adhesions  . Eye surgery  05/08/2011    left cataract removal   . Appendectomy    . Rotator cuff repair  2006    Right  . Breast surgery  05/03/11    LEFT BREAST NEEDLE CORE BIOPSY- DCIS  . 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  ER:+, PR+,   . Abdominal hysterectomy  1996    partial  . Exploratory laps      several due to abd pain related to ulcerative colitis    Family History  Problem Relation Age of Onset  . Breast cancer Maternal Grandmother   . Stomach cancer Maternal Grandfather   . Cancer Paternal Grandmother     stomach  . Cancer Other     Breast  . Atrial fibrillation Mother   . Heart attack Father   . Atrial fibrillation Brother   . Heart attack Paternal Uncle     History  Substance Use Topics  . Smoking status: Never Smoker   . Smokeless tobacco: Never Used  . Alcohol Use: No  OB History   Grav Para Term Preterm Abortions TAB SAB Ect Mult Living                  Review of Systems  Constitutional: Positive for chills and fatigue. Negative for fever.  Respiratory: Negative.   Cardiovascular: Negative.   Gastrointestinal: Positive for nausea and abdominal pain. Negative for vomiting.  Genitourinary: Positive for dysuria, frequency and flank pain.  Musculoskeletal: Negative for myalgias and back pain.  Skin: Negative.   Neurological: Positive for dizziness, weakness and headaches.    Allergies  Demerol; Dilaudid; Stadol; Amoxicillin; Cephalexin; and Penicillins  Home Medications   Current Outpatient Rx  Name  Route  Sig  Dispense  Refill  . aspirin 325 MG tablet   Oral   Take 325 mg by mouth at bedtime.          . Bismuth Subgallate (DEVROM) 200 MG CHEW   Oral   Chew 400 mg by  mouth 4 (four) times daily.         . calcium carbonate (OS-CAL) 600 MG TABS   Oral   Take 600 mg by mouth 2 (two) times daily with a meal.         . diclofenac sodium (VOLTAREN) 1 % GEL   Topical   Apply 2 g topically 4 (four) times daily as needed. For shoulder/ joint pain         . ferrous sulfate 325 (65 FE) MG tablet   Oral   Take 325 mg by mouth daily with breakfast.         . fish oil-omega-3 fatty acids 1000 MG capsule   Oral   Take 1 g by mouth every morning.          . Multiple Vitamins-Minerals (MULTIVITAMIN WITH MINERALS) tablet   Oral   Take 1 tablet by mouth daily.         . nitrofurantoin, macrocrystal-monohydrate, (MACROBID) 100 MG capsule   Oral   Take 100 mg by mouth at bedtime.         . tamoxifen (NOLVADEX) 10 MG tablet   Oral   Take 20 mg by mouth every morning.          . triamterene-hydrochlorothiazide (DYAZIDE) 37.5-25 MG per capsule   Oral   Take 1 capsule by mouth every morning.         . vitamin C (ASCORBIC ACID) 500 MG tablet   Oral   Take 1,000 mg by mouth daily.         . vitamin E 400 UNIT capsule   Oral   Take 400 Units by mouth every morning.          . zolpidem (AMBIEN) 5 MG tablet   Oral   Take 5 mg by mouth at bedtime as needed. For sleep           BP 112/62  Pulse 101  Temp(Src) 97.7 F (36.5 C) (Oral)  Resp 16  SpO2 99%  Physical Exam  Nursing note and vitals reviewed. Constitutional: She is oriented to person, place, and time. She appears well-developed and well-nourished. She appears distressed.  Weak  appearing  HENT:  Oral mucosa dry  Neck: Neck supple.  Cardiovascular: Normal rate, regular rhythm and normal heart sounds.   Pulmonary/Chest: Effort normal and breath sounds normal. No respiratory distress. She has no wheezes. She has no rales.  Abdominal: Soft. Bowel sounds are normal. She exhibits no distension. There is tenderness. There is  no rebound and no guarding.  Right ileostomy.  Suprapubic tenderness. Right CVA tenderness  Musculoskeletal: She exhibits no edema.  Neurological: She is alert and oriented to person, place, and time.  Skin: Skin is warm and dry.  Psychiatric: She has a normal mood and affect. Her behavior is normal.    ED Course  Procedures (including critical care time)  Pt with chronic UTIs, she is now symptomatic, with nausea, anorexia, weakness. Will get labs, UA  Results for orders placed during the hospital encounter of 06/05/12  CBC WITH DIFFERENTIAL      Result Value Range   WBC 6.5  4.0 - 10.5 K/uL   RBC 4.00  3.87 - 5.11 MIL/uL   Hemoglobin 12.2  12.0 - 15.0 g/dL   HCT 34.8 (*) 36.0 - 46.0 %   MCV 87.0  78.0 - 100.0 fL   MCH 30.5  26.0 - 34.0 pg   MCHC 35.1  30.0 - 36.0 g/dL   RDW 13.2  11.5 - 15.5 %   Platelets 227  150 - 400 K/uL   Neutrophils Relative 87 (*) 43 - 77 %   Neutro Abs 5.6  1.7 - 7.7 K/uL   Lymphocytes Relative 5 (*) 12 - 46 %   Lymphs Abs 0.3 (*) 0.7 - 4.0 K/uL   Monocytes Relative 8  3 - 12 %   Monocytes Absolute 0.5  0.1 - 1.0 K/uL   Eosinophils Relative 0  0 - 5 %   Eosinophils Absolute 0.0  0.0 - 0.7 K/uL   Basophils Relative 0  0 - 1 %   Basophils Absolute 0.0  0.0 - 0.1 K/uL  COMPREHENSIVE METABOLIC PANEL      Result Value Range   Sodium 127 (*) 135 - 145 mEq/L   Potassium 2.9 (*) 3.5 - 5.1 mEq/L   Chloride 86 (*) 96 - 112 mEq/L   CO2 21  19 - 32 mEq/L   Glucose, Bld 101 (*) 70 - 99 mg/dL   BUN 41 (*) 6 - 23 mg/dL   Creatinine, Ser 1.42 (*) 0.50 - 1.10 mg/dL   Calcium 9.7  8.4 - 10.5 mg/dL   Total Protein 7.9  6.0 - 8.3 g/dL   Albumin 2.7 (*) 3.5 - 5.2 g/dL   AST 15  0 - 37 U/L   ALT 9  0 - 35 U/L   Alkaline Phosphatase 66  39 - 117 U/L   Total Bilirubin 0.9  0.3 - 1.2 mg/dL   GFR calc non Af Amer 38 (*) >90 mL/min   GFR calc Af Amer 45 (*) >90 mL/min  LIPASE, BLOOD      Result Value Range   Lipase 33  11 - 59 U/L  URINALYSIS, ROUTINE W REFLEX MICROSCOPIC      Result Value Range   Color,  Urine GREEN (*) YELLOW   APPearance TURBID (*) CLEAR   Specific Gravity, Urine 1.017  1.005 - 1.030   pH 7.5  5.0 - 8.0   Glucose, UA NEGATIVE  NEGATIVE mg/dL   Hgb urine dipstick MODERATE (*) NEGATIVE   Bilirubin Urine NEGATIVE  NEGATIVE   Ketones, ur 40 (*) NEGATIVE mg/dL   Protein, ur >300 (*) NEGATIVE mg/dL   Urobilinogen, UA 0.2  0.0 - 1.0 mg/dL   Nitrite NEGATIVE  NEGATIVE   Leukocytes, UA LARGE (*) NEGATIVE  URINE MICROSCOPIC-ADD ON      Result Value Range   Squamous Epithelial / LPF RARE  RARE   WBC,  UA 21-50  <3 WBC/hpf   RBC / HPF 3-6  <3 RBC/hpf   Bacteria, UA MANY (*) RARE   Urine-Other URINALYSIS PERFORMED ON SUPERNATANT     No results found.  Recurrent UTI, last cultures sensetive to Vanc, ampicillin, macrobid. Pt allergic to ampicillin. On macrobid at present.   5:20 PM Spoke with Dr. Matilde Sprang, who recommended Gentamycin and vancomycin for infection. Admit to medicine  5:50 PM Spoke with [facility], asked for blood cultures, and CT abd/pelvis no contrast.    1. UTI (lower urinary tract infection)   2. Flank pain   3. Hypokalemia   4. Renal insufficiency   5. Hyponatremia       MDM  Pt with recurrent UTIs, now with weakness, malaise, abdominal pain, flank pain. Appears dehydrated. Potassium 42mq given. Fluids. Started on vanc and Gentamycin per urology recommendations. Cultures of urine and blood sent. Admitted to [facility].          TRenold Genta [place]02/12/14 1754

## 2012-06-05 NOTE — H&P (Signed)
[facility] History and Physical  Deanna Burke JGO:[id number] DOB: 1951/02/03 DOA: 06/05/2012  Referring physician:  Ezekiel Ina PCP:  [name], MD   Chief Complaint:  Weakness, nausea and vomiting  HPI:  The patient is a 62 y.o. 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  She presented to her PCP on Sunday with low back pain, pressure in her bottom, dysuria.  She had been on macrobid 156m BID and her PCP increased her macrobid to 207mBID and uralgesic TID for the last three days.  She is followed by infectious disease, Dr. CaMegan Salon She continued to get worse and have increased weakness.  She was having copious loose stool in her ileostomy.  She lives alone and finally she decided to come to the emergency department today.  In the ER, her UA demonstrated large leukocyte esterase, nitrate negative, WBC 21-50, many bacteria.  Sodium 127, potassium 2.9, chloride 86, BUN 41, creatinine 1.42.     Review of Systems:   Had subjective fevers last night and woke up soaking wet with sweat.  She has been unable to eat and drink well due to nausea.  She vomited once on Sunday night.  Denies chest pain, shortness of breath, cough, rhinorrhea, sinus congestion.  Denies blood in stool.  Lymphadenopathy, skin rash or ulcer, focal weakness, numbness, anxiety and depression, polyuria, polydipsia.     Past Medical History  Diagnosis Date  . Ulcerative colitis [id number]. Pulmonary embolus 1971  . Phlebitis 1971  . Small bowel obstruction [id number]. Small bowel perforation 1971/1996  . Large bowel perforation 1971/1996  . Scoliosis   . Torn rotator cuff 2005  . Sleep apnea 2009  . History of bone density study 2012  . Fatigue   . Cataract     Left Eye  . Hot flashes   . Complication of anesthesia 1996    pt has  ileostomy and had surgery in 1996 that paralyzed  . Hypertension   . Cancer     DCIS L breast  . H/O ulcerative colitis   . Breast cancer   . Arthritis     hands, lumbar spine, hips, right ankle  . S/P ileostomy   . S/P radiation therapy 07/24/11 - 09/06/11    Left Breast/ 5000 cGy in 25 Fractions with Boost of 1000 cGy in 5 Fractions  . 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 Abnormal finding on cardiovascular stress test, ischemia anterolateral 02/13/2012  . Syncope, ? anginal equivilant 02/13/2012  . Ileostomy in place, secondary to ulcerative colitis x 20 years 02/13/2012  . History of recurrent UTIs 02/13/2012   Past Surgical History  Procedure Laterality Date  . Colectomy  1973  . Ileostomy  1973  . Vaginoplasty  1975  . Exploratory laparotomy  1978/1990    with lysis of adhesions  . Eye surgery  05/08/2011    left cataract removal   . Appendectomy    . Rotator cuff repair  2006    Right  . Breast surgery  05/03/11    LEFT BREAST NEEDLE CORE BIOPSY- DCIS  . 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  ER:+, PR+,   . Abdominal hysterectomy  1996  partial  . Exploratory laps      several due to abd pain related to ulcerative colitis   Social History:  reports that she has never smoked. She has never used smokeless tobacco. She reports that she does not drink alcohol or use illicit drugs. Lives at home alone  Allergies  Allergen Reactions  . Demerol Nausea And Vomiting  . Dilaudid (Hydromorphone Hcl) Nausea And Vomiting and Other (See Comments)    Reaction: muscle flexion and cramping also with nausea and vomiting  . Stadol (Butorphanol Tartrate) Nausea And Vomiting  . Amoxicillin Rash  . Cephalexin Rash  . Penicillins Rash    Family History  Problem Relation Age of Onset  . Breast cancer Maternal Grandmother   . Stomach cancer  Maternal Grandfather   . Cancer Paternal Grandmother     stomach  . Cancer Other     Breast  . Atrial fibrillation Mother   . Heart attack Father   . Atrial fibrillation Brother   . Heart attack Paternal Uncle     Prior to Admission medications   Medication Sig Start Date End Date Taking? Authorizing Provider  aspirin 325 MG tablet Take 325 mg by mouth at bedtime.    Yes Historical Provider, MD  Bismuth Subgallate (DEVROM) 200 MG CHEW Chew 400 mg by mouth 4 (four) times daily.   Yes Historical Provider, MD  calcium carbonate (OS-CAL) 600 MG TABS Take 600 mg by mouth 2 (two) times daily with a meal.   Yes Historical Provider, MD  diclofenac sodium (VOLTAREN) 1 % GEL Apply 2 g topically 4 (four) times daily as needed. For shoulder/ joint pain   Yes Historical Provider, MD  ferrous sulfate 325 (65 FE) MG tablet Take 325 mg by mouth daily with breakfast.   Yes Historical Provider, MD  fish oil-omega-3 fatty acids 1000 MG capsule Take 1 g by mouth every morning.    Yes Historical Provider, MD  Multiple Vitamins-Minerals (MULTIVITAMIN WITH MINERALS) tablet Take 1 tablet by mouth daily.   Yes Historical Provider, MD  nitrofurantoin, macrocrystal-monohydrate, (MACROBID) 100 MG capsule Take 100 mg by mouth at bedtime. 03/04/12  Yes Michel Bickers, MD  tamoxifen (NOLVADEX) 10 MG tablet Take 20 mg by mouth every morning.    Yes Historical Provider, MD  triamterene-hydrochlorothiazide (DYAZIDE) 37.5-25 MG per capsule Take 1 capsule by mouth every morning.   Yes Historical Provider, MD  vitamin C (ASCORBIC ACID) 500 MG tablet Take 1,000 mg by mouth daily.   Yes Historical Provider, MD  vitamin E 400 UNIT capsule Take 400 Units by mouth every morning.    Yes Historical Provider, MD  zolpidem (AMBIEN) 5 MG tablet Take 5 mg by mouth at bedtime as needed. For sleep   Yes Historical Provider, MD   Physical Exam: Filed Vitals:   06/05/12 1321  BP: 112/62  Pulse: 101  Temp: 97.7 F (36.5 C)  TempSrc:  Oral  Resp: 16  SpO2: 99%     General:  Average weight caucasian female, no acute distress, lying on stretcher  Eyes:  PERRL, anicteric, non-injected.  ENT:  OP clear, non-erythematous without plaques or exudates.  MMM.  Neck:  Supple without TM or JVD.  Lymph:  No cervical, supraclavicular, or submandibular LAD.  Cardiovascular:  RRR without m/r/g. 2+ pulses  Respiratory:  CTA bilaterally without increased WOB.  Abdomen:  Hyperactive BS.  Soft, TTP in the right upper quadrant, epigastrium, and right lower quadrant without rebound or guarding.  Ileostomy in right  abdomen with green liquid stool.     Skin:  No rashes or focal lesions.  Musculoskeletal:  Normal bulk and tone.  No LE edema.  Psychiatric:  A & O x 4.  Appropriate affect.  Neurologic:  CN 3-12 intact.  5/5 strength.  Sensation intact.  Labs on Admission:  Basic Metabolic Panel:  Recent Labs Lab 06/05/12 1455  NA 127*  K 2.9*  CL 86*  CO2 21  GLUCOSE 101*  BUN 41*  CREATININE 1.42*  CALCIUM 9.7    Liver Function Tests:  Recent Labs Lab 06/05/12 1455  AST 15  ALT 9  ALKPHOS 66  BILITOT 0.9  PROT 7.9  ALBUMIN 2.7*    Recent Labs Lab 06/05/12 1455  LIPASE 33   No results found for this basename: AMMONIA,  in the last 168 hours CBC:  Recent Labs Lab 06/05/12 1455  WBC 6.5  NEUTROABS 5.6  HGB 12.2  HCT 34.8*  MCV 87.0  PLT 227   Cardiac Enzymes: No results found for this basename: CKTOTAL, CKMB, CKMBINDEX, TROPONINI,  in the last 168 hours  BNP (last 3 results) No results found for this basename: PROBNP,  in the last 8760 hours CBG: No results found for this basename: GLUCAP,  in the last 168 hours  Radiological Exams on Admission: No results found.   Assessment/Plan Active Problems:   * No active hospital problems. *   Pyelonephritis: -  Blood cultures -  F/u urine culture -  Vancomycin and gentamicin per pharmacy   CT to rule out obstructing stone and/or  worsening hydronephrosis:  Severe hydronephrosis on the right with possible previous urostomy? That is appears obstructed. Previous CT scan from 07/2011 demonstrated moderate hydro with contrast that leaked into the small bowel from the same area described as urostomy today.   -  Urology Dr. McDiarmid aware and requests we call Dr. Risa Grill in AM.    Hyponatremia, likely hypovolemia but may be related to obstructive uropathy -  Serum and urine osms, urine sodium -  CT scan as above -  Telemetry -   IVF  Hypokalemia, likely due to poor PO intake  -  Replete with IV potassium   Hypochloremia, likely due to GI losses and dehydration -  IVF as above  Acute kidney injury, likely prerenal, but rule out obstruction -  FENa -  Hydration -  Tx UTI  Diet:  Healthy heart (per patient request) ACCESS:  PIV IVF:  NS at 137m/h Proph:  lovenox  Code Status: full code Family Communication: spoke with patient alone Disposition Plan: admit to telemetry  Time spent: 60 min  SJanece Canterbury[facility] Pager [phone] If 7PM-7AM, please contact night-coverage [email or web address] Password [email or web address]2/03/2013, 5:34 PM

## 2012-06-05 NOTE — ED Notes (Signed)
Attempted to call report, told shift report was being given and nurse would call back.

## 2012-06-05 NOTE — ED Notes (Signed)
Pt in CT. Will return shortly

## 2012-06-05 NOTE — ED Notes (Signed)
Pt states currently being tx'd for UTI, states having n/v and weakness

## 2012-06-05 NOTE — ED Notes (Signed)
Patient transported to CT 

## 2012-06-05 NOTE — Telephone Encounter (Signed)
Patient called scheduling to reschedule appt, but the scheduler called this triage nurse concerned that patient is sick and at home alone. After listening to patient, she has chronic UTI problems, is under care with Dr Risa Grill and on antibiotics at present. She states she saw her primary a few days ago as well. She is just concerned because she continues to get weaker and having difficulty getting around, can not drive and needs to know what to do. Patient does sound very weak and slow to respond on phone. Informed patient she should go to ER for further evaluation. She is to either call a friend to bring her or call 911 for transport. She states she is afraid to be at home alone in the event she does become so weak she can't get out due to the impending weather. Patient verbalized she will get to hospital for further evaluation. Patient understands to call us back to reschedule appt when she is feeling better.

## 2012-06-05 NOTE — Progress Notes (Signed)
ANTIBIOTIC CONSULT NOTE - INITIAL  Pharmacy Consult for Vancomycin/Gentamicin  Indication: pyelonephritis, UTI  Allergies  Allergen Reactions  . Demerol Nausea And Vomiting  . Dilaudid (Hydromorphone Hcl) Nausea And Vomiting and Other (See Comments)    Reaction: muscle flexion and cramping also with nausea and vomiting  . Stadol (Butorphanol Tartrate) Nausea And Vomiting  . Amoxicillin Rash  . Cephalexin Rash  . Penicillins Rash    Patient Measurements: Height: 5' 4"  (162.6 cm) Weight: 164 lb (74.39 kg) IBW/kg (Calculated) : 54.7 Adjusted Body Weight: 62kg  Vital Signs: Temp: 97.7 F (36.5 C) (02/12 1321) Temp src: Oral (02/12 1321) BP: 112/62 mmHg (02/12 1321) Pulse Rate: 101 (02/12 1321) Intake/Output from previous day:   Intake/Output from this shift:    Labs:  Recent Labs  06/05/12 1455  WBC 6.5  HGB 12.2  PLT 227  CREATININE 1.42*   Estimated Creatinine Clearance: 40.1 ml/min (by C-G formula based on Cr of 1.42). No results found for this basename: VANCOTROUGH, VANCOPEAK, VANCORANDOM, GENTTROUGH, GENTPEAK, GENTRANDOM, TOBRATROUGH, TOBRAPEAK, TOBRARND, AMIKACINPEAK, AMIKACINTROU, AMIKACIN,  in the last 72 hours   Microbiology: No results found for this or any previous visit (from the past 720 hour(s)).  Medical History: Past Medical History  Diagnosis Date  . Ulcerative colitis [phone]  . Pulmonary embolus 1971  . Phlebitis 1971  . Small bowel obstruction [id number]  . Small bowel perforation 1971/1996  . Large bowel perforation 1971/1996  . Scoliosis   . Torn rotator cuff 2005  . Sleep apnea 2009  . History of bone density study 2012  . Fatigue   . Cataract     Left Eye  . Hot flashes   . 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 Hypertension   . Cancer     DCIS L breast  . H/O ulcerative colitis   . Breast cancer   . Arthritis     hands, lumbar spine, hips, right ankle  . S/P  ileostomy   . S/P radiation therapy 07/24/11 - 09/06/11    Left Breast/ 5000 cGy in 25 Fractions with Boost of 1000 cGy in 5 Fractions  . 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 Abnormal finding on cardiovascular stress test, ischemia anterolateral 02/13/2012  . Syncope, ? anginal equivilant 02/13/2012  . Ileostomy in place, secondary to ulcerative colitis x 20 years 02/13/2012  . History of recurrent UTIs 02/13/2012    Medications:  Scheduled:  . [COMPLETED] morphine  4 mg Intravenous Once  . [COMPLETED] ondansetron  4 mg Intravenous Once  . potassium chloride  40 mEq Oral BID   Infusions:  . [COMPLETED] sodium chloride 125 mL/hr at 06/05/12 1842  . [COMPLETED] sodium chloride Stopped (06/05/12 1841)   PRN:  Assessment: 62 yo F admitted with pyelonephritis and UTI.  Has had fever prior to admission, now weakness and emesis  Goal of Therapy:  Vancomycin trough level 15-20 mcg/ml Eradication of infection  Plan: 1.) Vancomycin 107m IV q 24 hours 2.) Gentamicin loading dose= 1361m then 10010mV q 12 hours 3.) Follow up with Vanc trough levels at steady state and Gentamicin levels at steady state if it is continued.   Measure antibiotic drug levels at steady state  RenGypsy Decant12/2014,6:51 PM

## 2012-06-05 NOTE — Telephone Encounter (Signed)
S/w pt to re r/s her appt. Pt was not feeling well and was forwarded to triage. appt not yet r/s'd.

## 2012-06-06 LAB — BASIC METABOLIC PANEL
Chloride: 94 mEq/L — ABNORMAL LOW (ref 96–112)
GFR calc Af Amer: 51 mL/min — ABNORMAL LOW (ref >90)
GFR calc non Af Amer: 44 mL/min — ABNORMAL LOW (ref >90)
Glucose, Bld: 110 mg/dL — ABNORMAL HIGH (ref 70–99)
Potassium: 2.9 mEq/L — ABNORMAL LOW (ref 3.5–5.1)
Sodium: 130 mEq/L — ABNORMAL LOW (ref 135–145)

## 2012-06-06 LAB — MAGNESIUM: Magnesium: 2.1 mg/dL (ref 1.5–2.5)

## 2012-06-06 LAB — CBC
Hemoglobin: 10.4 g/dL — ABNORMAL LOW (ref 12.0–15.0)
RBC: 3.43 MIL/uL — ABNORMAL LOW (ref 3.87–5.11)

## 2012-06-06 MED ORDER — CALCIUM CARBONATE 1250 (500 CA) MG PO TABS
1.0000 | ORAL_TABLET | Freq: Two times a day (BID) | ORAL | Status: DC
  Administered 2012-06-06 – 2012-06-15 (×17): 500 mg via ORAL
  Filled 2012-06-06 (×21): qty 1

## 2012-06-06 NOTE — Progress Notes (Addendum)
[facility] PROGRESS NOTE  Deanna Burke YBW:[id number] DOB: 1950-09-13 DOA: 06/05/2012 PCP: [name], MD  Brief narrative: 62 y.o. 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  Assessment/Plan: Acute pyelonephritis continue empiric antibiotics. Has hx of recurrent UTI , on microbid at home.   afebrile. Wbc normal. Follow cx Appreciate urology recommendation. No intervention needed at this time.  CT of the abdomen shows worsening hydronephrosis. Patient follows with Dr Megan Salon for recurrent UTI. Will d/w ID based on culture results.  Hypokalemia Replenish. Normal mg. Check repeat in pm Continue telemetry  AKI  likely prerenal and associated pyelonephritis Hold triamterine-HCTZ Follow with hydration  Hyponatremia  likey from dehydration  slowly improving   DVT prophylaxis  Code Status: full Family Communication: none at bedside Disposition Plan: home once stable   Consultants:  urology  Procedures:  none  Antibiotics:  IV vanco and genta  HPI/Subjective: Feels her rt flank pain to be better. No dysuria.  Objective: Filed Vitals:   06/05/12 1822 06/05/12 2011 06/06/12 0300 06/06/12 1429  BP:  120/55 98/48 107/55  Pulse:  91 94 99  Temp:  98.6 F (37 C) 98.8 F (37.1 C) 97.3 F (36.3 C)  TempSrc:  Oral Oral Oral  Resp:  18 18 18   Height: 5' 4"  (1.626 m)     Weight: 74.39 kg (164 lb)     SpO2:  100% 98% 100%    Intake/Output Summary (Last 24 hours) at 06/06/12 1444 Last data filed at 06/06/12 1312  Gross per 24 hour  Intake    480 ml  Output    451 ml  Net     29 ml   Filed Weights   06/05/12 1822  Weight: 74.39 kg (164 lb)    Exam:   General:  Elderly female in NAD  HEENT: no pallor, moist oral  mucosa  Cardiovascular: N S1& S2, no murmurs  Respiratory: clear b/l, no murmurs  Abdomen: soft, ND, BS+, right CVA tenderness, ileostomy in place.  Ext: warm, no edema   CNS: AAOX3  Data Reviewed: Basic Metabolic Panel:  Recent Labs Lab 06/05/12 1455 06/06/12 0456  NA 127* 130*  K 2.9* 2.9*  CL 86* 94*  CO2 21 24  GLUCOSE 101* 110*  BUN 41* 35*  CREATININE 1.42* 1.27*  CALCIUM 9.7 8.1*  MG  --  2.1   Liver Function Tests:  Recent Labs Lab 06/05/12 1455  AST 15  ALT 9  ALKPHOS 66  BILITOT 0.9  PROT 7.9  ALBUMIN 2.7*    Recent Labs Lab 06/05/12 1455  LIPASE 33   No results found for this basename: AMMONIA,  in the last 168 hours CBC:  Recent Labs Lab 06/05/12 1455 06/06/12 0456  WBC 6.5 5.5  NEUTROABS 5.6  --   HGB 12.2 10.4*  HCT 34.8* 30.3*  MCV 87.0 88.3  PLT 227 214   Cardiac Enzymes: No results found for this basename: CKTOTAL, CKMB, CKMBINDEX, TROPONINI,  in the last 168 hours BNP (last 3 results) No results found for this basename: PROBNP,  in the last 8760 hours CBG: No results found for this basename: GLUCAP,  in the last 168 hours  No results found for this or any previous visit (from the past 240 hour(s)).   Studies: Ct Abdomen Pelvis Wo Contrast  06/05/2012  *RADIOLOGY REPORT*  Clinical Data: Right-sided abdominal pain, history of UTI, history of enlarged right kidney; history of colectomy, ileostomy, appendectomy, hysterectomy, small bowel obstruction, history breast cancer  CT ABDOMEN AND PELVIS WITHOUT CONTRAST  Technique:  Multidetector CT imaging of the abdomen and pelvis was performed following the standard protocol without intravenous contrast.  Comparison: CT abdomen pelvis - 08/03/2011  Findings:  The lack of intravenous contrast limits the ability to evaluate solid abdominal organs.  Right lower quadrant urostomy.  There is now severe hydronephrosis of the right kidney with obstruction likely at the level of the anastomosis  with the urostomy (sagittal image 70, series 4), though this is suboptimally evaluated secondary to lack of intravenous contrast.   Minimal amount of asymmetric perinephric stranding. Unchanged obstructing 5 mm stone within the inferior pole of the right kidney (coronal image 21, series 3).  Normal noncontrast appearance of the left kidney.  No left-sided nephrolithiasis or urinary obstruction.  Normal noncontrast appearance of the bilateral adrenal glands, pancreas and spleen.  Normal hepatic contour.  Normal noncontrast appearance of the gallbladder.  No ascites.  There is extensive postsurgical change of the abdomen within the left lower abdominal quadrant.  There are no dilated loops of bowel to suggest enteric obstruction.  No pneumoperitoneum, pneumatosis or portal venous gas.  Scattered minimal atherosclerotic plaque within a normal caliber abdominal aorta.  No definite retroperitoneal, mesenteric, pelvic or inguinal lymphadenopathy on this noncontrast examination.  Post hysterectomy. There is a grossly unchanged approximately 2.2 x 2.3 cm hypoattenuating fluid collection either within the vaginal cuff or within the rectum (sagittal image 66, series four).  Limited visualization of the lower thorax demonstrates minimal subsegmental atelectasis within the bilateral lower lobes.  Normal heart size.  No pericardial effusion.  No acute or aggressive osseous abnormalities.  Moderate scoliotic curvature of the thoracolumbar spine with associated degenerative change.  IMPRESSION: 1.  Interval worsening of now severe right-sided pelvocaliectasis with apparent obstruction at the level of the anastomosis with the urostomy, though this is suboptimally evaluated without intravenous contrast.  2.  Extensive postsurgical change of the abdomen without evidence of enteric obstruction. 3.  Unchanged indetermine approximately 2.3 cm fluid collection within the vaginal cuff versus the rectum, incompletely evaluated.   Original  Report Authenticated By: Jake Seats, MD     Scheduled Meds: . aspirin EC  81 mg Oral Daily  . Bismuth Subgallate  400 mg Oral QID  . calcium carbonate  1 tablet Oral BID WC  . enoxaparin (LOVENOX) injection  40 mg Subcutaneous Q24H  . ferrous sulfate  325 mg Oral Q breakfast  . gentamicin  100 mg Intravenous Q12H  . multivitamin with minerals  1 tablet Oral Daily  . omega-3 acid ethyl esters  1 g Oral q morning - 10a  . potassium chloride  40 mEq Oral BID  . sodium chloride  3 mL Intravenous Q12H  . tamoxifen  20 mg Oral q morning - 10a  . triamterene-hydrochlorothiazide  1 each Oral Daily  . vancomycin  1,000 mg Intravenous Q24H  . vitamin C  1,000 mg Oral Daily  . vitamin E  400 Units Oral q morning - 10a   Continuous Infusions:     Time spent: *25 minutes    Tyrek Lawhorn  [facility] Pager [phone]. If 8PM-8AM, please contact night-coverage at [email or web address], password [facility] 06/06/2012, 2:44 PM  LOS: 1 day

## 2012-06-06 NOTE — Care Management Note (Unsigned)
    Page 1 of 1   06/06/2012     1:50:23 PM   CARE MANAGEMENT NOTE 06/06/2012  Patient:  Deanna Burke, Deanna Burke   Account Number:  [id number]  Date Initiated:  06/06/2012  Documentation initiated by:  Dessa Phi  Subjective/Objective Assessment:   ADMITTED W/N/V.     Action/Plan:   FROM HOME ALONE.TAKES CARE OF HER MOTHER @ [place].HAS PCP,PHARMACY.   Anticipated DC Date:  06/11/2012   Anticipated DC Plan:  [place]  CM consult      Choice offered to / List presented to:             Status of service:  In process, will continue to follow Medicare Important Message given?   (If response is "NO", the following Medicare IM given date fields will be blank) Date Medicare IM given:   Date Additional Medicare IM given:    Discharge Disposition:    Per UR Regulation:  Reviewed for med. necessity/level of care/duration of stay  If discussed at [place] of Stay Meetings, dates discussed:    Comments:  06/06/12 [facility] RN,BSN [phone]

## 2012-06-06 NOTE — Consult Note (Signed)
Urology Consult   Physician requesting consult: Dr. Sheran Fava  Reason for consult:  Right hydronephrosis  History of Present Illness: Deanna Burke is a 62 y.o. with a history of ulcerative colitis with an ileostomy for 40 plus years and a history of breast cancer.  She is a patient of Dr. Risa Grill who has seen her initially as a hospital consult last year for right hydronephrosis. 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 She also has a history of recurrent cystitis which has been a difficult problem for her and she has been followed by Dr. Risa Grill and Dr. Megan Salon (ID) for this problem.  She has been on nitrofurantoin prophylaxis for her recurrent UTIs.  She began having lower mid back pain, dysuria, and suprapubic pressure last Wednesday which initially improved and then worsened.  She saw her PCP and was placed on therapeutic nitrofurantoin.  No culture is available from that visit. She became increasingly weak and had some nausea and presented to the hospital and was admitted for this reason.  She is currently on Vancomycin and Gentamicin and urine and blood cultures are pending.  She has not had objective fever and her WBC is normal.  Her renal function has been stable.    Past Medical History  Diagnosis Date  . Ulcerative colitis [phone]  . Pulmonary embolus 1971  . Phlebitis 1971  . Small bowel obstruction [id number]  . Small bowel perforation 1971/1996  . Large bowel perforation 1971/1996  . Scoliosis   . Torn rotator cuff 2005  . Sleep apnea 2009  . History of bone density study 2012  . Fatigue   . Cataract     Left Eye  . Hot flashes   . 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 Hypertension   . Cancer     DCIS L breast  . H/O  ulcerative colitis   . Breast cancer   . Arthritis     hands, lumbar spine, hips, right ankle  . S/P ileostomy   . S/P radiation therapy 07/24/11 - 09/06/11    Left Breast/ 5000 cGy in 25 Fractions with Boost of 1000 cGy in 5 Fractions  . 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 Abnormal finding on cardiovascular stress test, ischemia anterolateral 02/13/2012  . Syncope, ? anginal equivilant 02/13/2012  . Ileostomy in place, secondary to ulcerative colitis x 20 years 02/13/2012  . History of recurrent UTIs 02/13/2012    Past Surgical History  Procedure Laterality Date  . Colectomy  1973  . Ileostomy  1973  . Vaginoplasty  1975  . Exploratory laparotomy  1978/1990    with lysis of adhesions  . Eye surgery  05/08/2011    left cataract removal   . Appendectomy    . Rotator cuff repair  2006    Right  . Breast surgery  05/03/11    LEFT BREAST NEEDLE CORE BIOPSY- DCIS  . 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  ER:+, PR+,   . Abdominal hysterectomy  1996    partial  . Exploratory laps      several due to abd pain related  to ulcerative colitis     Current Hospital Medications: Scheduled Meds: . aspirin EC  81 mg Oral Daily  . Bismuth Subgallate  400 mg Oral QID  . calcium carbonate  1 tablet Oral BID WC  . enoxaparin (LOVENOX) injection  40 mg Subcutaneous Q24H  . ferrous sulfate  325 mg Oral Q breakfast  . gentamicin  100 mg Intravenous Q12H  . multivitamin with minerals  1 tablet Oral Daily  . omega-3 acid ethyl esters  1 g Oral q morning - 10a  . potassium chloride  40 mEq Oral BID  . sodium chloride  3 mL Intravenous Q12H  . tamoxifen  20 mg Oral q morning - 10a  . triamterene-hydrochlorothiazide  1 each Oral Daily  . vancomycin  1,000 mg Intravenous Q24H  . vitamin C  1,000 mg Oral Daily  . vitamin E  400 Units Oral q morning - 10a    Continuous Infusions:  PRN Meds:.acetaminophen, acetaminophen, morphine injection, ondansetron (ZOFRAN) IV, ondansetron, oxyCODONE  Allergies:  Allergies  Allergen Reactions  . Demerol Nausea And Vomiting  . Dilaudid (Hydromorphone Hcl) Nausea And Vomiting and Other (See Comments)    Reaction: muscle flexion and cramping also with nausea and vomiting  . Stadol (Butorphanol Tartrate) Nausea And Vomiting  . Amoxicillin Rash  . Cephalexin Rash  . Penicillins Rash    Family History  Problem Relation Age of Onset  . Breast cancer Maternal Grandmother   . Stomach cancer Maternal Grandfather   . Cancer Paternal Grandmother     stomach  . Cancer Other     Breast  . Atrial fibrillation Mother   . Heart attack Father   . Atrial fibrillation Brother   . Heart attack Paternal Uncle     Social History:  reports that she has never smoked. She has never used smokeless tobacco. She reports that she does not drink alcohol or use illicit drugs.  ROS: A complete review of systems was performed.  All systems are negative except for pertinent findings as noted.  Physical Exam:  Vital signs in last 24 hours: Temp:  [97.7 F (36.5 C)-98.8 F (37.1 C)] 98.8 F (37.1 C) (02/13 0300) Pulse Rate:  [91-101] 94 (02/13 0300) Resp:  [16-18] 18 (02/13 0300) BP: (98-120)/(48-62) 98/48 mmHg (02/13 0300) SpO2:  [98 %-100 %] 98 % (02/13 0300) Weight:  [74.39 kg (164 lb)] 74.39 kg (164 lb) (02/12 1822) General:  Alert and oriented, No acute distress HEENT: Normocephalic, atraumatic Neck: No JVD or lymphadenopathy Cardiovascular: Regular rate and rhythm Lungs: Clear bilaterally Abdomen: She has an ileostomy.  Soft and nondistended. Back: She has bilateral flank tenderness. Extremities: No edema Neurologic: Grossly intact  Laboratory Data:   Recent Labs  06/05/12 1455 06/06/12 0456  WBC 6.5 5.5  HGB 12.2 10.4*  HCT 34.8* 30.3*  PLT 227 214     Recent Labs  06/05/12 1455  06/06/12 0456  NA 127* 130*  K 2.9* 2.9*  CL 86* 94*  GLUCOSE 101* 110*  BUN 41* 35*  CALCIUM 9.7 8.1*  CREATININE 1.42* 1.27*     Results for orders placed during the hospital encounter of 06/05/12 (from the past 24 hour(s))  URINALYSIS, ROUTINE W REFLEX MICROSCOPIC     Status: Abnormal   Collection Time    06/05/12  2:39 PM      Result Value Range   Color, Urine GREEN (*) YELLOW   APPearance TURBID (*) CLEAR   Specific Gravity, Urine 1.017  1.005 -  1.030   pH 7.5  5.0 - 8.0   Glucose, UA NEGATIVE  NEGATIVE mg/dL   Hgb urine dipstick MODERATE (*) NEGATIVE   Bilirubin Urine NEGATIVE  NEGATIVE   Ketones, ur 40 (*) NEGATIVE mg/dL   Protein, ur >300 (*) NEGATIVE mg/dL   Urobilinogen, UA 0.2  0.0 - 1.0 mg/dL   Nitrite NEGATIVE  NEGATIVE   Leukocytes, UA LARGE (*) NEGATIVE  URINE MICROSCOPIC-ADD ON     Status: Abnormal   Collection Time    06/05/12  2:39 PM      Result Value Range   Squamous Epithelial / LPF RARE  RARE   WBC, UA 21-50  <3 WBC/hpf   RBC / HPF 3-6  <3 RBC/hpf   Bacteria, UA MANY (*) RARE   Urine-Other URINALYSIS PERFORMED ON SUPERNATANT    CBC WITH DIFFERENTIAL     Status: Abnormal   Collection Time    06/05/12  2:55 PM      Result Value Range   WBC 6.5  4.0 - 10.5 K/uL   RBC 4.00  3.87 - 5.11 MIL/uL   Hemoglobin 12.2  12.0 - 15.0 g/dL   HCT 34.8 (*) 36.0 - 46.0 %   MCV 87.0  78.0 - 100.0 fL   MCH 30.5  26.0 - 34.0 pg   MCHC 35.1  30.0 - 36.0 g/dL   RDW 13.2  11.5 - 15.5 %   Platelets 227  150 - 400 K/uL   Neutrophils Relative 87 (*) 43 - 77 %   Neutro Abs 5.6  1.7 - 7.7 K/uL   Lymphocytes Relative 5 (*) 12 - 46 %   Lymphs Abs 0.3 (*) 0.7 - 4.0 K/uL   Monocytes Relative 8  3 - 12 %   Monocytes Absolute 0.5  0.1 - 1.0 K/uL   Eosinophils Relative 0  0 - 5 %   Eosinophils Absolute 0.0  0.0 - 0.7 K/uL   Basophils Relative 0  0 - 1 %   Basophils Absolute 0.0  0.0 - 0.1 K/uL  COMPREHENSIVE METABOLIC PANEL     Status: Abnormal   Collection Time     06/05/12  2:55 PM      Result Value Range   Sodium 127 (*) 135 - 145 mEq/L   Potassium 2.9 (*) 3.5 - 5.1 mEq/L   Chloride 86 (*) 96 - 112 mEq/L   CO2 21  19 - 32 mEq/L   Glucose, Bld 101 (*) 70 - 99 mg/dL   BUN 41 (*) 6 - 23 mg/dL   Creatinine, Ser 1.42 (*) 0.50 - 1.10 mg/dL   Calcium 9.7  8.4 - 10.5 mg/dL   Total Protein 7.9  6.0 - 8.3 g/dL   Albumin 2.7 (*) 3.5 - 5.2 g/dL   AST 15  0 - 37 U/L   ALT 9  0 - 35 U/L   Alkaline Phosphatase 66  39 - 117 U/L   Total Bilirubin 0.9  0.3 - 1.2 mg/dL   GFR calc non Af Amer 38 (*) >90 mL/min   GFR calc Af Amer 45 (*) >90 mL/min  LIPASE, BLOOD     Status: None   Collection Time    06/05/12  2:55 PM      Result Value Range   Lipase 33  11 - 59 U/L  OSMOLALITY     Status: None   Collection Time    06/05/12  6:09 PM      Result Value Range  Osmolality 285  275 - 300 mOsm/kg  BASIC METABOLIC PANEL     Status: Abnormal   Collection Time    06/06/12  4:56 AM      Result Value Range   Sodium 130 (*) 135 - 145 mEq/L   Potassium 2.9 (*) 3.5 - 5.1 mEq/L   Chloride 94 (*) 96 - 112 mEq/L   CO2 24  19 - 32 mEq/L   Glucose, Bld 110 (*) 70 - 99 mg/dL   BUN 35 (*) 6 - 23 mg/dL   Creatinine, Ser 1.27 (*) 0.50 - 1.10 mg/dL   Calcium 8.1 (*) 8.4 - 10.5 mg/dL   GFR calc non Af Amer 44 (*) >90 mL/min   GFR calc Af Amer 51 (*) >90 mL/min  CBC     Status: Abnormal   Collection Time    06/06/12  4:56 AM      Result Value Range   WBC 5.5  4.0 - 10.5 K/uL   RBC 3.43 (*) 3.87 - 5.11 MIL/uL   Hemoglobin 10.4 (*) 12.0 - 15.0 g/dL   HCT 30.3 (*) 36.0 - 46.0 %   MCV 88.3  78.0 - 100.0 fL   MCH 30.3  26.0 - 34.0 pg   MCHC 34.3  30.0 - 36.0 g/dL   RDW 13.6  11.5 - 15.5 %   Platelets 214  150 - 400 K/uL  MAGNESIUM     Status: None   Collection Time    06/06/12  4:56 AM      Result Value Range   Magnesium 2.1  1.5 - 2.5 mg/dL   No results found for this or any previous visit (from the past 240 hour(s)).  Renal Function:  Recent Labs   06/05/12 1455 06/06/12 0456  CREATININE 1.42* 1.27*   Estimated Creatinine Clearance: 44.8 ml/min (by C-G formula based on Cr of 1.27).  Radiologic Imaging: Ct Abdomen Pelvis Wo Contrast  06/05/2012  *RADIOLOGY REPORT*  Clinical Data: Right-sided abdominal pain, history of UTI, history of enlarged right kidney; history of colectomy, ileostomy, appendectomy, hysterectomy, small bowel obstruction, history breast cancer  CT ABDOMEN AND PELVIS WITHOUT CONTRAST  Technique:  Multidetector CT imaging of the abdomen and pelvis was performed following the standard protocol without intravenous contrast.  Comparison: CT abdomen pelvis - 08/03/2011  Findings:  The lack of intravenous contrast limits the ability to evaluate solid abdominal organs.  Right lower quadrant urostomy.  There is now severe hydronephrosis of the right kidney with obstruction likely at the level of the anastomosis with the urostomy (sagittal image 70, series 4), though this is suboptimally evaluated secondary to lack of intravenous contrast.   Minimal amount of asymmetric perinephric stranding. Unchanged obstructing 5 mm stone within the inferior pole of the right kidney (coronal image 21, series 3).  Normal noncontrast appearance of the left kidney.  No left-sided nephrolithiasis or urinary obstruction.  Normal noncontrast appearance of the bilateral adrenal glands, pancreas and spleen.  Normal hepatic contour.  Normal noncontrast appearance of the gallbladder.  No ascites.  There is extensive postsurgical change of the abdomen within the left lower abdominal quadrant.  There are no dilated loops of bowel to suggest enteric obstruction.  No pneumoperitoneum, pneumatosis or portal venous gas.  Scattered minimal atherosclerotic plaque within a normal caliber abdominal aorta.  No definite retroperitoneal, mesenteric, pelvic or inguinal lymphadenopathy on this noncontrast examination.  Post hysterectomy. There is a grossly unchanged approximately  2.2 x 2.3 cm hypoattenuating fluid collection either within the  vaginal cuff or within the rectum (sagittal image 66, series four).  Limited visualization of the lower thorax demonstrates minimal subsegmental atelectasis within the bilateral lower lobes.  Normal heart size.  No pericardial effusion.  No acute or aggressive osseous abnormalities.  Moderate scoliotic curvature of the thoracolumbar spine with associated degenerative change.  IMPRESSION: 1.  Interval worsening of now severe right-sided pelvocaliectasis with apparent obstruction at the level of the anastomosis with the urostomy, though this is suboptimally evaluated without intravenous contrast.  2.  Extensive postsurgical change of the abdomen without evidence of enteric obstruction. 3.  Unchanged indetermine approximately 2.3 cm fluid collection within the vaginal cuff versus the rectum, incompletely evaluated.   Original Report Authenticated By: Jake Seats, MD     I independently reviewed the above imaging studies.  It should be noted that the patient does not have a urostomy but rather an ileostomy.  She has chronic right hydronephrosis with a stable nonobstructing lower pole right renal calculus.  Impression/Assessment:  Right hydronephrosis and UTI  Plan:  1) Right hydronephrosis: Ms. Maddux has chronic right hydronephrosis with appears consistent with a long standing congential UPJ obstruction based on her history.  This is likely unrelated to her current or past UTIs.  I do not see a need for any urologic intervention in the absence of fever, elevated WBC, or worsening renal function.  2) UTI: I agree with broad spectrum antibiotics while awaiting her final cultures.  It may be prudent to check her outside urine culture from her PCP considering she has been on oral outpatient antibiotics which may affect her culture results.  I will notify Dr. Risa Grill so that he can continue to follow her during her  hospitalization.  Kash Mothershead,LES 06/06/2012, 10:10 AM  Pryor Curia. MD   CC: Dr. Sheran Fava

## 2012-06-07 DIAGNOSIS — N390 Urinary tract infection, site not specified: Secondary | ICD-10-CM

## 2012-06-07 LAB — CBC
Hemoglobin: 10.1 g/dL — ABNORMAL LOW (ref 12.0–15.0)
RBC: 3.37 MIL/uL — ABNORMAL LOW (ref 3.87–5.11)

## 2012-06-07 LAB — BASIC METABOLIC PANEL
CO2: 22 mEq/L (ref 19–32)
GFR calc non Af Amer: 55 mL/min — ABNORMAL LOW (ref >90)
Glucose, Bld: 149 mg/dL — ABNORMAL HIGH (ref 70–99)
Potassium: 3.1 mEq/L — ABNORMAL LOW (ref 3.5–5.1)
Sodium: 128 mEq/L — ABNORMAL LOW (ref 135–145)

## 2012-06-07 LAB — URINE CULTURE: Colony Count: >=100000

## 2012-06-07 MED ORDER — POTASSIUM CHLORIDE CRYS ER 20 MEQ PO TBCR
40.0000 meq | EXTENDED_RELEASE_TABLET | Freq: Once | ORAL | Status: AC
  Administered 2012-06-07: 40 meq via ORAL
  Filled 2012-06-07: qty 2

## 2012-06-07 NOTE — Progress Notes (Signed)
Pt has fever of 101.8. Gave Tylenol 623m. Rechecked temp after 1 hour; 98.6

## 2012-06-07 NOTE — ED Provider Notes (Signed)
Medical screening examination/treatment/procedure(s) were performed by non-physician practitioner and as supervising physician I was immediately available for consultation/collaboration.  Jasper Riling. Alvino Chapel, MD 06/07/12 [phone]

## 2012-06-07 NOTE — Progress Notes (Signed)
[facility] PROGRESS NOTE  Deanna Burke KTG:[id number] DOB: 19-Sep-1950 DOA: 06/05/2012 PCP: [name], MD  Brief narrative:  61 y.o. 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   Assessment/Plan:  Acute pyelonephritis  continue empiric antibiotics. Has hx of recurrent UTI , on microbid at home. Urine culture growing Proteus. Sensitivity pending Patient now febrile. Normal white count. Blood culture negative. Appreciate urology recommendation. No intervention needed at this time and recommend to treat with antibiotics for now.CT of the abdomen shows worsening hydronephrosis.  Patient follows with Dr Megan Salon for recurrent UTI. Will d/w ID based on culture results.   Hypokalemia  Replenish. Normal mg.  Continue telemetry monitoring  AKI  likely prerenal and associated pyelonephritis  Hold triamterine-HCTZ . Renal function improved with hydration. Still has some hyponatremia which likely is secondary to dehydration and being on diuretics.  Hyponatremia  likey from dehydration and being on diuretics Monitor in a.m.  DVT prophylaxis  Code Status: full  Family Communication: none at bedside  Disposition Plan: home once stable    Consultants:  urology Procedures:  none Antibiotics:  IV vanco and genta (day 3)   Antibiotics:  IV vancomycin and gentamicin  HPI/Subjective: Patient had a temperature spike of 101.8 overnight. Denies any nausea, vomiting . Still has some right flank pain.  Objective: Filed Vitals:   06/06/12 2022 06/06/12 2203 06/07/12 0511 06/07/12 1542  BP: 106/50  96/48 109/52  Pulse: 101  96 103  Temp: 101.8 F (38.8 C) 98.9 F (37.2 C) 99.7 F (37.6 C) 100.7 F (38.2 C)  TempSrc: Oral Oral Oral Oral  Resp: 16  16 18   Height:       Weight:      SpO2: 100%  97% 100%    Intake/Output Summary (Last 24 hours) at 06/07/12 1639 Last data filed at 06/07/12 1543  Gross per 24 hour  Intake    920 ml  Output   2075 ml  Net  -1155 ml   Filed Weights   06/05/12 1822  Weight: 74.39 kg (164 lb)    Exam:  General: Elderly female in NAD  HEENT: no pallor, moist oral mucosa  Cardiovascular: N S1& S2, no murmurs  Respiratory: clear b/l, no murmurs  Abdomen: soft, ND, BS+, right CVA tenderness, ileostomy in place.  Ext: warm, no edema  CNS: AAOX3   Data Reviewed: Basic Metabolic Panel:  Recent Labs Lab 06/05/12 1455 06/06/12 0456 06/07/12 0835  NA 127* 130* 128*  K 2.9* 2.9* 3.1*  CL 86* 94* 93*  CO2 21 24 22   GLUCOSE 101* 110* 149*  BUN 41* 35* 23  CREATININE 1.42* 1.27* 1.05  CALCIUM 9.7 8.1* 8.7  MG  --  2.1  --    Liver Function Tests:  Recent Labs Lab 06/05/12 1455  AST 15  ALT 9  ALKPHOS 66  BILITOT 0.9  PROT 7.9  ALBUMIN 2.7*    Recent Labs Lab 06/05/12 1455  LIPASE 33   No results found for this basename: AMMONIA,  in the last 168 hours CBC:  Recent Labs Lab 06/05/12 1455 06/06/12 0456 06/07/12 0835  WBC 6.5 5.5 5.9  NEUTROABS 5.6  --   --   HGB 12.2 10.4* 10.1*  HCT 34.8* 30.3* 29.6*  MCV 87.0 88.3 87.8  PLT 227 214 248  Cardiac Enzymes: No results found for this basename: CKTOTAL, CKMB, CKMBINDEX, TROPONINI,  in the last 168 hours BNP (last 3 results) No results found for this basename: PROBNP,  in the last 8760 hours CBG: No results found for this basename: GLUCAP,  in the last 168 hours  Recent Results (from the past 240 hour(s))  URINE CULTURE     Status: None   Collection Time    06/05/12  2:39 PM      Result Value Range Status   Specimen Description URINE, CLEAN CATCH   Final   Special Requests NONE   Final   Culture  Setup Time 06/06/2012 01:31   Final   Colony Count >=100,000 COLONIES/ML   Final   Culture PROTEUS MIRABILIS   Final   Report Status  PENDING   Incomplete  CULTURE, BLOOD (ROUTINE X 2)     Status: None   Collection Time    06/05/12  6:09 PM      Result Value Range Status   Specimen Description BLOOD LEFT HAND   Final   Special Requests BOTTLES DRAWN AEROBIC AND ANAEROBIC 5ML   Final   Culture  Setup Time 06/05/2012 23:30   Final   Culture     Final   Value:        BLOOD CULTURE RECEIVED NO GROWTH TO DATE CULTURE WILL BE HELD FOR 5 DAYS BEFORE ISSUING A FINAL NEGATIVE REPORT   Report Status PENDING   Incomplete  CULTURE, BLOOD (ROUTINE X 2)     Status: None   Collection Time    06/05/12  6:15 PM      Result Value Range Status   Specimen Description BLOOD LEFT ASSIST CONTROL   Final   Special Requests BOTTLES DRAWN AEROBIC AND ANAEROBIC 3ML   Final   Culture  Setup Time 06/05/2012 23:30   Final   Culture     Final   Value:        BLOOD CULTURE RECEIVED NO GROWTH TO DATE CULTURE WILL BE HELD FOR 5 DAYS BEFORE ISSUING A FINAL NEGATIVE REPORT   Report Status PENDING   Incomplete     Studies: Ct Abdomen Pelvis Wo Contrast  06/05/2012  *RADIOLOGY REPORT*  Clinical Data: Right-sided abdominal pain, history of UTI, history of enlarged right kidney; history of colectomy, ileostomy, appendectomy, hysterectomy, small bowel obstruction, history breast cancer  CT ABDOMEN AND PELVIS WITHOUT CONTRAST  Technique:  Multidetector CT imaging of the abdomen and pelvis was performed following the standard protocol without intravenous contrast.  Comparison: CT abdomen pelvis - 08/03/2011  Findings:  The lack of intravenous contrast limits the ability to evaluate solid abdominal organs.  Right lower quadrant urostomy.  There is now severe hydronephrosis of the right kidney with obstruction likely at the level of the anastomosis with the urostomy (sagittal image 70, series 4), though this is suboptimally evaluated secondary to lack of intravenous contrast.   Minimal amount of asymmetric perinephric stranding. Unchanged obstructing 5 mm stone  within the inferior pole of the right kidney (coronal image 21, series 3).  Normal noncontrast appearance of the left kidney.  No left-sided nephrolithiasis or urinary obstruction.  Normal noncontrast appearance of the bilateral adrenal glands, pancreas and spleen.  Normal hepatic contour.  Normal noncontrast appearance of the gallbladder.  No ascites.  There is extensive postsurgical change of the abdomen within the left lower abdominal quadrant.  There are no dilated loops of bowel to suggest enteric obstruction.  No pneumoperitoneum, pneumatosis or  portal venous gas.  Scattered minimal atherosclerotic plaque within a normal caliber abdominal aorta.  No definite retroperitoneal, mesenteric, pelvic or inguinal lymphadenopathy on this noncontrast examination.  Post hysterectomy. There is a grossly unchanged approximately 2.2 x 2.3 cm hypoattenuating fluid collection either within the vaginal cuff or within the rectum (sagittal image 66, series four).  Limited visualization of the lower thorax demonstrates minimal subsegmental atelectasis within the bilateral lower lobes.  Normal heart size.  No pericardial effusion.  No acute or aggressive osseous abnormalities.  Moderate scoliotic curvature of the thoracolumbar spine with associated degenerative change.  IMPRESSION: 1.  Interval worsening of now severe right-sided pelvocaliectasis with apparent obstruction at the level of the anastomosis with the urostomy, though this is suboptimally evaluated without intravenous contrast.  2.  Extensive postsurgical change of the abdomen without evidence of enteric obstruction. 3.  Unchanged indetermine approximately 2.3 cm fluid collection within the vaginal cuff versus the rectum, incompletely evaluated.   Original Report Authenticated By: Jake Seats, MD     Scheduled Meds: . aspirin EC  81 mg Oral Daily  . Bismuth Subgallate  400 mg Oral QID  . calcium carbonate  1 tablet Oral BID WC  . enoxaparin (LOVENOX) injection   40 mg Subcutaneous Q24H  . ferrous sulfate  325 mg Oral Q breakfast  . gentamicin  100 mg Intravenous Q12H  . multivitamin with minerals  1 tablet Oral Daily  . omega-3 acid ethyl esters  1 g Oral q morning - 10a  . potassium chloride  40 mEq Oral BID  . potassium chloride  40 mEq Oral Once  . sodium chloride  3 mL Intravenous Q12H  . tamoxifen  20 mg Oral q morning - 10a  . vancomycin  1,000 mg Intravenous Q24H  . vitamin C  1,000 mg Oral Daily  . vitamin E  400 Units Oral q morning - 10a   Continuous Infusions:     Time spent: 25 minutes    Toia Micale, [place]  [facility] Pager [phone] If 8PM-8AM, please contact night-coverage at [email or web address], password [facility] 06/07/2012, 4:39 PM  LOS: 2 days

## 2012-06-08 DIAGNOSIS — R109 Unspecified abdominal pain: Secondary | ICD-10-CM

## 2012-06-08 LAB — BASIC METABOLIC PANEL
BUN: 22 mg/dL (ref 6–23)
Chloride: 101 mEq/L (ref 96–112)
GFR calc Af Amer: 66 mL/min — ABNORMAL LOW (ref >90)
Glucose, Bld: 106 mg/dL — ABNORMAL HIGH (ref 70–99)
Potassium: 4.2 mEq/L (ref 3.5–5.1)

## 2012-06-08 MED ORDER — CIPROFLOXACIN HCL 500 MG PO TABS
500.0000 mg | ORAL_TABLET | Freq: Two times a day (BID) | ORAL | Status: DC
  Administered 2012-06-08: 500 mg via ORAL
  Filled 2012-06-08 (×3): qty 1

## 2012-06-08 MED ORDER — SULFAMETHOXAZOLE-TMP DS 800-160 MG PO TABS
1.0000 | ORAL_TABLET | Freq: Two times a day (BID) | ORAL | Status: DC
  Administered 2012-06-08 – 2012-06-09 (×4): 1 via ORAL
  Filled 2012-06-08 (×6): qty 1

## 2012-06-08 NOTE — Progress Notes (Signed)
[facility] PROGRESS NOTE  [name] XVQ:[id number] DOB: [birth year] DOA: 06/05/2012 PCP: [name], MD  Brief narrative:  62 y.o. 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    Assessment/Plan:  Acute pyelonephritis  Urine culture growing Proteus and switched to Bactrim after discussion with Dr. Megan Salon. Has hx of recurrent UTI , on microbid at home. Dr. Megan Salon recommends treating for 2 weeks -Patient has been febrile for past 2 days. Blood culture negative. Remains afebrile since overnight. Appreciate urology recommendation. No intervention needed at this time and recommend to treat with antibiotics for now.CT of the abdomen shows worsening hydronephrosis. Recommends treating for pyelonephritis and discharged home once stable with outpatient follow up with Dr. Risa Grill.  Hypokalemia  Replenished ng   AKI  likely prerenal and associated pyelonephritis  Hold triamterine-HCTZ . Renal function improved with hydration. Still has some hyponatremia which likely is secondary to dehydration and being on diuretics. Now improved.  Hyponatremia  likey from dehydration and being on diuretics  Improved with hydration  DVT prophylaxis  Code Status: full  Family Communication: none at bedside  Disposition Plan: home once stable    Consultants:  urology Procedures:  none Antibiotics:  IV vanco and genta (2/12--2/14)  Po cipro ( 2/14- 2/15)  po bactrim ( 2/15>)  Antibiotics:  IV vancomycin and gentamicin HPI/Subjective:  Tmax of 100.7 yesterday evening. Afebrile since then.  Denies any nausea, vomiting . Still has some right flank pain.   Objective: Filed Vitals:   06/07/12 1542 06/07/12 2058 06/08/12 0505 06/08/12 1300  BP: 109/52 113/49 103/56 117/59  Pulse:  103 103 95 95  Temp: 100.7 F (38.2 C) 99.3 F (37.4 C) 99.7 F (37.6 C) 98.7 F (37.1 C)  TempSrc: Oral Oral Oral Oral  Resp: 18 18 18 17   Height:      Weight:      SpO2: 100% 99% 97% 98%    Intake/Output Summary (Last 24 hours) at 06/08/12 1616 Last data filed at 06/08/12 1500  Gross per 24 hour  Intake    240 ml  Output   1550 ml  Net  -1310 ml   Filed Weights   06/05/12 1822  Weight: 74.39 kg (164 lb)    Exam:  General: Elderly female in NAD  HEENT: no pallor, moist oral mucosa  Cardiovascular: N S1& S2, no murmurs  Respiratory: clear b/l, no murmurs  Abdomen: soft, ND, BS+, right CVA tenderness, ileostomy in place.  Ext: warm, no edema  CNS: AAOX3   Data Reviewed: Basic Metabolic Panel:  Recent Labs Lab 06/05/12 1455 06/06/12 0456 06/07/12 0835 06/08/12 0539  NA 127* 130* 128* 132*  K 2.9* 2.9* 3.1* 4.2  CL 86* 94* 93* 101  CO2 21 24 22 20   GLUCOSE 101* 110* 149* 106*  BUN 41* 35* 23 22  CREATININE 1.42* 1.27* 1.05 1.03  CALCIUM 9.7 8.1* 8.7 8.8  MG  --  2.1  --   --    Liver Function Tests:  Recent Labs Lab 06/05/12 1455  AST 15  ALT 9  ALKPHOS 66  BILITOT 0.9  PROT 7.9  ALBUMIN 2.7*    Recent Labs Lab 06/05/12 1455  LIPASE 33   No results found for this basename: AMMONIA,  in the last 168 hours CBC:  Recent Labs Lab  06/05/12 1455 06/06/12 0456 06/07/12 0835  WBC 6.5 5.5 5.9  NEUTROABS 5.6  --   --   HGB 12.2 10.4* 10.1*  HCT 34.8* 30.3* 29.6*  MCV 87.0 88.3 87.8  PLT 227 214 248   Cardiac Enzymes: No results found for this basename: CKTOTAL, CKMB, CKMBINDEX, TROPONINI,  in the last 168 hours BNP (last 3 results) No results found for this basename: PROBNP,  in the last 8760 hours CBG: No results found for this basename: GLUCAP,  in the last 168 hours  Recent Results (from the past 240 hour(s))  URINE CULTURE     Status: None   Collection Time    06/05/12  2:39 PM      Result Value Range Status   Specimen  Description URINE, CLEAN CATCH   Final   Special Requests NONE   Final   Culture  Setup Time 06/06/2012 01:31   Final   Colony Count >=100,000 COLONIES/ML   Final   Culture PROTEUS MIRABILIS   Final   Report Status 06/07/2012 FINAL   Final   Organism ID, Bacteria PROTEUS MIRABILIS   Final  CULTURE, BLOOD (ROUTINE X 2)     Status: None   Collection Time    06/05/12  6:09 PM      Result Value Range Status   Specimen Description BLOOD LEFT HAND   Final   Special Requests BOTTLES DRAWN AEROBIC AND ANAEROBIC 5ML   Final   Culture  Setup Time 06/05/2012 23:30   Final   Culture     Final   Value:        BLOOD CULTURE RECEIVED NO GROWTH TO DATE CULTURE WILL BE HELD FOR 5 DAYS BEFORE ISSUING A FINAL NEGATIVE REPORT   Report Status PENDING   Incomplete  CULTURE, BLOOD (ROUTINE X 2)     Status: None   Collection Time    06/05/12  6:15 PM      Result Value Range Status   Specimen Description BLOOD LEFT ASSIST CONTROL   Final   Special Requests BOTTLES DRAWN AEROBIC AND ANAEROBIC 3ML   Final   Culture  Setup Time 06/05/2012 23:30   Final   Culture     Final   Value:        BLOOD CULTURE RECEIVED NO GROWTH TO DATE CULTURE WILL BE HELD FOR 5 DAYS BEFORE ISSUING A FINAL NEGATIVE REPORT   Report Status PENDING   Incomplete     Studies: No results found.  Scheduled Meds: . aspirin EC  81 mg Oral Daily  . Bismuth Subgallate  400 mg Oral QID  . calcium carbonate  1 tablet Oral BID WC  . enoxaparin (LOVENOX) injection  40 mg Subcutaneous Q24H  . ferrous sulfate  325 mg Oral Q breakfast  . multivitamin with minerals  1 tablet Oral Daily  . omega-3 acid ethyl esters  1 g Oral q morning - 10a  . potassium chloride  40 mEq Oral BID  . sodium chloride  3 mL Intravenous Q12H  . sulfamethoxazole-trimethoprim  1 tablet Oral Q12H  . tamoxifen  20 mg Oral q morning - 10a  . vitamin C  1,000 mg Oral Daily  . vitamin E  400 Units Oral q morning - 10a   Continuous Infusions:     Time spent: 25  minutes    Shantanique Hodo, [place]  [facility] Pager [phone]. If 8PM-8AM, please contact night-coverage at [email or web address], password [facility] 06/08/2012, 4:16 PM  LOS: 3 days

## 2012-06-08 NOTE — Progress Notes (Signed)
Afebrile Serum Cr normal and improved Blood c/s normal Proteus in urine sensitive to gent

## 2012-06-08 NOTE — Progress Notes (Signed)
Afebrile Serum Cr normal and improved  Blood c/s normal Proteus in urine- now on cipro Voiding better but still hurting some May d/c home when clinically OK and f/up with Dr Risa Grill

## 2012-06-09 ENCOUNTER — Encounter (HOSPITAL_COMMUNITY): Payer: Self-pay | Admitting: Radiology

## 2012-06-09 ENCOUNTER — Inpatient Hospital Stay (HOSPITAL_COMMUNITY): Payer: BC Managed Care – PPO

## 2012-06-09 DIAGNOSIS — Z87440 Personal history of urinary (tract) infections: Secondary | ICD-10-CM

## 2012-06-09 NOTE — Progress Notes (Signed)
[facility] PROGRESS NOTE  Deanna Burke GGE:[id number] DOB: 1950-07-23 DOA: 06/05/2012 PCP: [name], MD  Brief narrative:  62 y.o. 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   Assessment/Plan:  Acute pyelonephritis  Patient initially started on IV vancomycin and gentamicin. Urine culture growing Proteus and switched to Bactrim after discussion with Dr. Megan Salon. Has hx of recurrent UTI , on microbid at home. Dr. Megan Salon recommends treating for 2 weeks . -Patient has been febrile for past 3 days. Blood culture negative. Tmax of 101.6. F. will repeat blood culture. Appreciate urology recommendation. No intervention needed at this time and recommend to treat with antibiotics for now.CT of the abdomen shows worsening hydronephrosis. Recommends treating for pyelonephritis and discharged home once stable with outpatient follow up with Dr. Risa Grill.   Hypokalemia  Replenished   AKI  likely prerenal and associated pyelonephritis  Hold triamterine-HCTZ . Renal function improved with hydration. Still has some hyponatremia which likely is secondary to dehydration and being on diuretics. Now improved.   Hyponatremia  likey from dehydration and being on diuretics  Improved with hydration   DVT prophylaxis   Code Status: full  Family Communication: none at bedside  Disposition Plan: home once stable    Consultants:  Urology   Procedures:  None   Antibiotics:  IV vanco and genta (2/12--2/14)  Po cipro ( 2/14- 2/15)  po bactrim ( 2/15>)   HPI/Subjective:  Right flank pain better but frustrated about persistent fever     Objective: Filed Vitals:   06/08/12 2100 06/08/12 2320 06/09/12 0510 06/09/12 0702  BP: 124/49  110/48   Pulse: 98  97   Temp: 101.6 F  (38.7 C) 98 F (36.7 C) 101.6 F (38.7 C) 99.1 F (37.3 C)  TempSrc: Oral Oral Oral Oral  Resp: 18  18   Height:      Weight:      SpO2: 97%  98%     Intake/Output Summary (Last 24 hours) at 06/09/12 1224 Last data filed at 06/09/12 0844  Gross per 24 hour  Intake    240 ml  Output    900 ml  Net   -660 ml   Filed Weights   06/05/12 1822  Weight: 74.39 kg (164 lb)    Exam: General: Elderly female in NAD  HEENT: no pallor, moist oral mucosa  Cardiovascular: N S1& S2, no murmurs  Respiratory: clear b/l, no murmurs  Abdomen: soft, ND, BS+, minimal right CVA tenderness, ileostomy in place.  Ext: warm, no edema  CNS: AAOX3     Data Reviewed: Basic Metabolic Panel:  Recent Labs Lab 06/05/12 1455 06/06/12 0456 06/07/12 0835 06/08/12 0539  NA 127* 130* 128* 132*  K 2.9* 2.9* 3.1* 4.2  CL 86* 94* 93* 101  CO2 21 24 22 20   GLUCOSE 101* 110* 149* 106*  BUN 41* 35* 23 22  CREATININE 1.42* 1.27* 1.05 1.03  CALCIUM 9.7 8.1* 8.7 8.8  MG  --  2.1  --   --    Liver Function Tests:  Recent Labs Lab 06/05/12 1455  AST 15  ALT 9  ALKPHOS 66  BILITOT 0.9  PROT 7.9  ALBUMIN 2.7*    Recent Labs Lab 06/05/12 1455  LIPASE 33   No results found for this basename: AMMONIA,  in  the last 168 hours CBC:  Recent Labs Lab 06/05/12 1455 06/06/12 0456 06/07/12 0835  WBC 6.5 5.5 5.9  NEUTROABS 5.6  --   --   HGB 12.2 10.4* 10.1*  HCT 34.8* 30.3* 29.6*  MCV 87.0 88.3 87.8  PLT 227 214 248   Cardiac Enzymes: No results found for this basename: CKTOTAL, CKMB, CKMBINDEX, TROPONINI,  in the last 168 hours BNP (last 3 results) No results found for this basename: PROBNP,  in the last 8760 hours CBG: No results found for this basename: GLUCAP,  in the last 168 hours  Recent Results (from the past 240 hour(s))  URINE CULTURE     Status: None   Collection Time    06/05/12  2:39 PM      Result Value Range Status   Specimen Description URINE, CLEAN CATCH   Final    Special Requests NONE   Final   Culture  Setup Time 06/06/2012 01:31   Final   Colony Count >=100,000 COLONIES/ML   Final   Culture PROTEUS MIRABILIS   Final   Report Status 06/07/2012 FINAL   Final   Organism ID, Bacteria PROTEUS MIRABILIS   Final  CULTURE, BLOOD (ROUTINE X 2)     Status: None   Collection Time    06/05/12  6:09 PM      Result Value Range Status   Specimen Description BLOOD LEFT HAND   Final   Special Requests BOTTLES DRAWN AEROBIC AND ANAEROBIC 5ML   Final   Culture  Setup Time 06/05/2012 23:30   Final   Culture     Final   Value:        BLOOD CULTURE RECEIVED NO GROWTH TO DATE CULTURE WILL BE HELD FOR 5 DAYS BEFORE ISSUING A FINAL NEGATIVE REPORT   Report Status PENDING   Incomplete  CULTURE, BLOOD (ROUTINE X 2)     Status: None   Collection Time    06/05/12  6:15 PM      Result Value Range Status   Specimen Description BLOOD LEFT ASSIST CONTROL   Final   Special Requests BOTTLES DRAWN AEROBIC AND ANAEROBIC 3ML   Final   Culture  Setup Time 06/05/2012 23:30   Final   Culture     Final   Value:        BLOOD CULTURE RECEIVED NO GROWTH TO DATE CULTURE WILL BE HELD FOR 5 DAYS BEFORE ISSUING A FINAL NEGATIVE REPORT   Report Status PENDING   Incomplete     Studies: No results found.  Scheduled Meds: . aspirin EC  81 mg Oral Daily  . Bismuth Subgallate  400 mg Oral QID  . calcium carbonate  1 tablet Oral BID WC  . enoxaparin (LOVENOX) injection  40 mg Subcutaneous Q24H  . ferrous sulfate  325 mg Oral Q breakfast  . multivitamin with minerals  1 tablet Oral Daily  . omega-3 acid ethyl esters  1 g Oral q morning - 10a  . potassium chloride  40 mEq Oral BID  . sodium chloride  3 mL Intravenous Q12H  . sulfamethoxazole-trimethoprim  1 tablet Oral Q12H  . tamoxifen  20 mg Oral q morning - 10a  . vitamin C  1,000 mg Oral Daily  . vitamin E  400 Units Oral q morning - 10a   Continuous Infusions:     Time spent: 25 minutes    Deanna Burke, [place]  [facility] Pager [phone]. If 8PM-8AM, please contact night-coverage at [email or web address], password [facility]  06/09/2012, 12:24 PM  LOS: 4 days

## 2012-06-10 ENCOUNTER — Inpatient Hospital Stay (HOSPITAL_COMMUNITY): Payer: BC Managed Care – PPO

## 2012-06-10 ENCOUNTER — Encounter (HOSPITAL_COMMUNITY): Payer: Self-pay | Admitting: Anesthesiology

## 2012-06-10 ENCOUNTER — Other Ambulatory Visit: Payer: Self-pay | Admitting: Urology

## 2012-06-10 ENCOUNTER — Inpatient Hospital Stay (HOSPITAL_COMMUNITY): Payer: BC Managed Care – PPO | Admitting: Anesthesiology

## 2012-06-10 ENCOUNTER — Encounter (HOSPITAL_COMMUNITY)
Admission: EM | Disposition: A | Discharge status: Home / Self Care | Payer: Self-pay | Source: Home / Self Care | Attending: Internal Medicine

## 2012-06-10 DIAGNOSIS — N10 Acute tubulo-interstitial nephritis: Secondary | ICD-10-CM

## 2012-06-10 LAB — CBC WITH DIFFERENTIAL/PLATELET
Basophils Absolute: 0 10*3/uL (ref 0.0–0.1)
Eosinophils Relative: 0 % (ref 0–5)
HCT: 27.3 % — ABNORMAL LOW (ref 36.0–46.0)
Hemoglobin: 9.5 g/dL — ABNORMAL LOW (ref 12.0–15.0)
Lymphocytes Relative: 6 % — ABNORMAL LOW (ref 12–46)
Lymphs Abs: 0.5 10*3/uL — ABNORMAL LOW (ref 0.7–4.0)
MCV: 89.5 fL (ref 78.0–100.0)
Monocytes Absolute: 0.4 10*3/uL (ref 0.1–1.0)
Monocytes Relative: 5 % (ref 3–12)
RDW: 14.4 % (ref 11.5–15.5)
WBC: 7.4 10*3/uL (ref 4.0–10.5)

## 2012-06-10 SURGERY — CYSTOSCOPY, WITH RETROGRADE PYELOGRAM AND URETERAL STENT INSERTION
Anesthesia: General

## 2012-06-10 MED ORDER — MIDAZOLAM HCL 2 MG/2ML IJ SOLN
INTRAMUSCULAR | Status: AC | PRN
  Administered 2012-06-10 (×2): 1 mg via INTRAVENOUS

## 2012-06-10 MED ORDER — FENTANYL CITRATE 0.05 MG/ML IJ SOLN: 25.0000 ug | INTRAMUSCULAR | Status: DC | PRN

## 2012-06-10 MED ORDER — CEFAZOLIN SODIUM 1-5 GM-% IV SOLN
1.0000 g | Freq: Three times a day (TID) | INTRAVENOUS | Status: DC
  Administered 2012-06-10 – 2012-06-11 (×3): 1 g via INTRAVENOUS
  Filled 2012-06-10 (×5): qty 50

## 2012-06-10 MED ORDER — CIPROFLOXACIN IN D5W 400 MG/200ML IV SOLN
400.0000 mg | Freq: Once | INTRAVENOUS | Status: AC
  Administered 2012-06-10: 400 mg via INTRAVENOUS
  Filled 2012-06-10: qty 200

## 2012-06-10 MED ORDER — FENTANYL CITRATE 0.05 MG/ML IJ SOLN
INTRAMUSCULAR | Status: AC | PRN
  Administered 2012-06-10: 100 ug via INTRAVENOUS

## 2012-06-10 MED ORDER — KETOROLAC TROMETHAMINE 30 MG/ML IJ SOLN: 15.0000 mg | Freq: Once | INTRAMUSCULAR | Status: AC | PRN

## 2012-06-10 MED ORDER — IOHEXOL 300 MG/ML  SOLN
50.0000 mL | Freq: Once | INTRAMUSCULAR | Status: AC | PRN
  Administered 2012-06-10: 10 mL

## 2012-06-10 MED ORDER — LIDOCAINE HCL 1 % IJ SOLN
INTRAMUSCULAR | Status: AC
  Filled 2012-06-10: qty 20

## 2012-06-10 MED ORDER — FENTANYL CITRATE 0.05 MG/ML IJ SOLN
INTRAMUSCULAR | Status: AC
  Filled 2012-06-10: qty 6

## 2012-06-10 MED ORDER — MIDAZOLAM HCL 2 MG/2ML IJ SOLN
INTRAMUSCULAR | Status: AC
  Filled 2012-06-10: qty 6

## 2012-06-10 MED ORDER — PROMETHAZINE HCL 25 MG/ML IJ SOLN: 6.2500 mg | INTRAMUSCULAR | Status: DC | PRN

## 2012-06-10 NOTE — H&P (Signed)
Deanna Burke is an 62 y.o. female.   Chief Complaint: Fevers and right hydronephrosis.   HPI: 57 with h/o ulcerative colitis s/p colectomy many years ago.  Chronic right hydronephrosis which may be related to UPJ obstruction.  Previous nuclear medicine scan demonstrated minimal function in right kidney.  Concern that fevers are related to right hydronephrosis.    Past Medical History  Diagnosis Date  . Ulcerative colitis [phone]  . Pulmonary embolus 1971  . Phlebitis 1971  . Small bowel obstruction [id number]  . Small bowel perforation 1971/1996  . Large bowel perforation 1971/1996  . Scoliosis   . Torn rotator cuff 2005  . Sleep apnea 2009  . History of bone density study 2012  . Fatigue   . Cataract     Left Eye  . Hot flashes   . 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 Hypertension   . Cancer     DCIS L breast  . H/O ulcerative colitis   . Breast cancer   . Arthritis     hands, lumbar spine, hips, right ankle  . S/P ileostomy   . S/P radiation therapy 07/24/11 - 09/06/11    Left Breast/ 5000 cGy in 25 Fractions with Boost of 1000 cGy in 5 Fractions  . 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 Abnormal finding on cardiovascular stress test, ischemia anterolateral 02/13/2012  . Syncope, ? anginal equivilant 02/13/2012  . Ileostomy in place, secondary to ulcerative colitis x 20 years 02/13/2012  . History of recurrent UTIs 02/13/2012    Past Surgical History  Procedure Laterality Date  . Colectomy  1973  . Ileostomy  1973  . Vaginoplasty  1975  . Exploratory laparotomy  1978/1990    with lysis of adhesions  . Eye surgery  05/08/2011    left cataract removal   . Appendectomy    . Rotator cuff repair  2006    Right  . Breast surgery  05/03/11    LEFT BREAST NEEDLE CORE BIOPSY- DCIS  . Breast lumpectomy  05/31/11    LEFT BREAST LUMPECTOMY, NEGATIVE MARGINS, HIGH GRADE  DUCTAL   CARCINOMA IN SITU WITH ASSOCIATED CALCIFICATIONS.  ER:+, PR+,   . Abdominal hysterectomy  1996    partial  . Exploratory laps      several due to abd pain related to ulcerative colitis    Family History  Problem Relation Age of Onset  . Breast cancer Maternal Grandmother   . Stomach cancer Maternal Grandfather   . Cancer Paternal Grandmother     stomach  . Cancer Other     Breast  . Atrial fibrillation Mother   . Heart attack Father   . Atrial fibrillation Brother   . Heart attack Paternal Uncle    Social History:  reports that she has never smoked. She has never used smokeless tobacco. She reports that she does not drink alcohol or use illicit drugs.  Allergies:  Allergies  Allergen Reactions  . Demerol Nausea And Vomiting  . Dilaudid (Hydromorphone Hcl) Nausea And Vomiting and Other (See Comments)    Reaction: muscle flexion and cramping also with nausea and vomiting  . Stadol (Butorphanol Tartrate) Nausea And Vomiting  . Amoxicillin Rash  . Cephalexin Rash  . Penicillins Rash    Medications Prior to Admission  Medication Sig Dispense Refill  . aspirin 325 MG tablet Take 325 mg  by mouth at bedtime.       . Bismuth Subgallate (DEVROM) 200 MG CHEW Chew 400 mg by mouth 4 (four) times daily.      . calcium carbonate (OS-CAL) 600 MG TABS Take 600 mg by mouth 2 (two) times daily with a meal.      . diclofenac sodium (VOLTAREN) 1 % GEL Apply 2 g topically 4 (four) times daily as needed. For shoulder/ joint pain      . ferrous sulfate 325 (65 FE) MG tablet Take 325 mg by mouth daily with breakfast.      . fish oil-omega-3 fatty acids 1000 MG capsule Take 1 g by mouth every morning.       . Multiple Vitamins-Minerals (MULTIVITAMIN WITH MINERALS) tablet Take 1 tablet by mouth daily.      . nitrofurantoin, macrocrystal-monohydrate, (MACROBID) 100 MG capsule Take 100 mg by mouth at bedtime.      . tamoxifen (NOLVADEX) 10 MG tablet Take 20 mg by mouth every morning.       .  triamterene-hydrochlorothiazide (DYAZIDE) 37.5-25 MG per capsule Take 1 capsule by mouth every morning.      . vitamin C (ASCORBIC ACID) 500 MG tablet Take 1,000 mg by mouth daily.      . vitamin E 400 UNIT capsule Take 400 Units by mouth every morning.         Results for orders placed during the hospital encounter of 06/05/12 (from the past 48 hour(s))  CULTURE, BLOOD (ROUTINE X 2)     Status: None   Collection Time    06/09/12 12:50 PM      Result Value Range   Specimen Description BLOOD RIGHT ARM     Special Requests       Value: BOTTLES DRAWN AEROBIC AND ANAEROBIC 5CC BOTH BOTTLES   Culture  Setup Time 06/09/2012 17:32     Culture       Value:        BLOOD CULTURE RECEIVED NO GROWTH TO DATE CULTURE WILL BE HELD FOR 5 DAYS BEFORE ISSUING A FINAL NEGATIVE REPORT   Report Status PENDING    CULTURE, BLOOD (ROUTINE X 2)     Status: None   Collection Time    06/09/12 12:56 PM      Result Value Range   Specimen Description BLOOD RIGHT HAND     Special Requests       Value: BOTTLES DRAWN AEROBIC AND ANAEROBIC 2CC BOTH BOTTLES   Culture  Setup Time 06/09/2012 17:32     Culture       Value:        BLOOD CULTURE RECEIVED NO GROWTH TO DATE CULTURE WILL BE HELD FOR 5 DAYS BEFORE ISSUING A FINAL NEGATIVE REPORT   Report Status PENDING    CBC WITH DIFFERENTIAL     Status: Abnormal   Collection Time    06/10/12 10:40 AM      Result Value Range   WBC 7.4  4.0 - 10.5 K/uL   RBC 3.05 (*) 3.87 - 5.11 MIL/uL   Hemoglobin 9.5 (*) 12.0 - 15.0 g/dL   HCT 27.3 (*) 36.0 - 46.0 %   MCV 89.5  78.0 - 100.0 fL   MCH 31.1  26.0 - 34.0 pg   MCHC 34.8  30.0 - 36.0 g/dL   RDW 14.4  11.5 - 15.5 %   Platelets 447 (*) 150 - 400 K/uL   Neutrophils Relative 88 (*) 43 - 77 %   Neutro  Abs 6.5  1.7 - 7.7 K/uL   Lymphocytes Relative 6 (*) 12 - 46 %   Lymphs Abs 0.5 (*) 0.7 - 4.0 K/uL   Monocytes Relative 5  3 - 12 %   Monocytes Absolute 0.4  0.1 - 1.0 K/uL   Eosinophils Relative 0  0 - 5 %   Eosinophils  Absolute 0.0  0.0 - 0.7 K/uL   Basophils Relative 1  0 - 1 %   Basophils Absolute 0.0  0.0 - 0.1 K/uL   Ct Abdomen Pelvis Wo Contrast  06/09/2012  *RADIOLOGY REPORT*  Clinical Data: Pyelonephritis with persistent fever, evaluate for abscess  CT ABDOMEN AND PELVIS WITHOUT CONTRAST  Technique:  Multidetector CT imaging of the abdomen and pelvis was performed following the standard protocol without intravenous contrast.  Comparison: Recent prior CT scan 06/05/2012  Findings:  Lower Chest:  Areas of subsegmental atelectasis versus chronic scarring noted in both lower lobes.  These regions are less conspicuous than seen on the recent prior study indicating improved inspiratory volumes.  Visualized cardiac structures within normal limits for size.  Trace pericardial fluid versus thickening anteriorly is similar to prior.  Unremarkable distal thoracic esophagus.  Abdomen: Significantly limited evaluation of the solid organs and vasculature in the absence of intravenous contrast material. Within these limitations, unremarkable noncontrasted CT appearance of the stomach and duodenum, spleen, adrenal glands and liver. Gallbladder is collapsed without evidence of cholelithiasis or intra or extrahepatic biliary ductal dilatation.  The pancreas is within normal limits.  No significant interval change in the appearance of marked right-sided hydronephrosis there is mild perinephric stranding about the kidney and renal pelvis similar to the prior study.  The proximal ureter is also dilated. Stable 3 mm nonobstructing stone in the lower pole.  A rounds 5 mm calculus on image 55 of series 2 may be within the ureter or represent a phlebolith in the gonadal vein.  This is similar to prior studies and favored to represent a phlebolith.  The left kidney is unremarkable.  Extensive postsurgical changes throughout the abdomen with a prior colectomy, right lower quadrant ileostomy and hysterectomy.  Stable appearance of a 2.1 by 1.9 cm  round low attenuation fluid collection associated with the rectal wall.  The bladder remains distended with urine.  No free fluid or adenopathy.  The Bones: No acute fracture or aggressive appearing lytic or blastic osseous lesion.  Similar appearance of levoconvex scoliosis centered at L2-L3 with extensive associated degenerative changes.  Vascular: Extremely limited evaluation in the absence of intravenous contrast material  IMPRESSION:  1.  No significant interval change in the appearance of marked right hydronephrosis and dilatation of the proximal right ureter with surrounding perinephric stranding compared to 06/05/2012. Evaluation for pyelonephritis is significantly limited in the absence of intravenous contrast material.  2.  Stable indeterminate 2.3 cm fluid collection in the low anatomic pelvis within the vaginal cuff versus rectal wall which remains incompletely evaluated but stable dating back to at least April 2013.  3.  Extensive intra-abdominal postoperative changes without evidence of obstruction.   Original Report Authenticated By: Jacqulynn Cadet, M.D.     Review of Systems  Constitutional: Positive for fever.  Respiratory: Negative.   Cardiovascular: Negative.   Gastrointestinal: Positive for abdominal pain.    Blood pressure 94/59, pulse 99, temperature 99.5 F (37.5 C), temperature source Oral, resp. rate 20, height 5' 4"  (1.626 m), weight 164 lb (74.39 kg), SpO2 96.00%. Physical Exam  Cardiovascular: Normal rate, regular rhythm  and normal heart sounds.   Respiratory: Effort normal and breath sounds normal.  GI: Soft. Bowel sounds are normal. There is tenderness.     Assessment/Plan 62 yo with massive right hydronephrosis and fevers.  Concern that fevers are related to the hydronephrosis.  Discussed percutaneous nephrostomy vs. retrograde utereral stent placement with Dr. Risa Grill.  Decided that a percutaneous nephrostomy would offer the best decompression, particularly if the  fluid is complex or infected.  Discussed the nephrostomy with the patient.  Explained the risks of bleeding and infection.  Informed consent obtained from the patient.  Plan nephrostomy placement this afternoon with moderate sedation.  Nafis Farnan RYAN 06/10/2012, 1:14 PM

## 2012-06-10 NOTE — Procedures (Signed)
Successful percutaneous right nephrostomy tube placement.  Placed 10 French drain and removed 280 ml of thick cloudy yellow fluid.  No immediate complication.

## 2012-06-10 NOTE — Progress Notes (Signed)
Patient ID: Deanna Burke, female   DOB: Feb 12, 1951, 62 y.o.   MRN: [id number]   Subjective: Patient reports  Not feeling significantly better. She has continued to have general fatigue and malaise. She has had some right-sided abdominal and flank discomfort. I am quite concerned that she has now been febrile the last 48 hours. She does have known hydronephrosis and one has to be concerned that she does indeed have a more complicated traction at this point involving the right kidney.  Objective: Vital signs in last 24 hours: Temp:  [98.5 F (36.9 C)-102.8 F (39.3 C)] 99.5 F (37.5 C) (02/17 0605) Pulse Rate:  [99-122] 99 (02/17 0605) Resp:  [18-20] 20 (02/17 0605) BP: (94-133)/(50-59) 94/59 mmHg (02/17 0605) SpO2:  [96 %-100 %] 96 % (02/17 0605)  Intake/Output from previous day: 02/16 0701 - 02/17 0700 In: 250 [P.O.:240; I.V.:10] Out: -  Intake/Output this shift: Total I/O In: 240 [P.O.:240] Out: 1 [Urine:1]  Physical Exam:  Constitutional: Vital signs reviewed. WD WN in NAD   Eyes: PERRL, No scleral icterus.   Cardiovascular: RRR Pulmonary/Chest: Normal effort Abdominal: Soft. Non-tender, non-distended.  Genitourinary:not examined Extremities: No cyanosis or edema   Lab Results:  Recent Labs  06/10/12 1040  HGB 9.5*  HCT 27.3*   BMET  Recent Labs  06/08/12 0539  NA 132*  K 4.2  CL 101  CO2 20  GLUCOSE 106*  BUN 22  CREATININE 1.03  CALCIUM 8.8   No results found for this basename: LABPT, INR,  in the last 72 hours No results found for this basename: LABURIN,  in the last 72 hours Results for orders placed during the hospital encounter of 06/05/12  URINE CULTURE     Status: None   Collection Time    06/05/12  2:39 PM      Result Value Range Status   Specimen Description URINE, CLEAN CATCH   Final   Special Requests NONE   Final   Culture  Setup Time 06/06/2012 01:31   Final   [place]ony Count >=100,000 COLONIES/ML   Final   Culture PROTEUS MIRABILIS    Final   Report Status 06/07/2012 FINAL   Final   Organism ID, Bacteria PROTEUS MIRABILIS   Final  CULTURE, BLOOD (ROUTINE X 2)     Status: None   Collection Time    06/05/12  6:09 PM      Result Value Range Status   Specimen Description BLOOD LEFT HAND   Final   Special Requests BOTTLES DRAWN AEROBIC AND ANAEROBIC 5ML   Final   Culture  Setup Time 06/05/2012 23:30   Final   Culture     Final   Value:        BLOOD CULTURE RECEIVED NO GROWTH TO DATE CULTURE WILL BE HELD FOR 5 DAYS BEFORE ISSUING A FINAL NEGATIVE REPORT   Report Status PENDING   Incomplete  CULTURE, BLOOD (ROUTINE X 2)     Status: None   Collection Time    06/05/12  6:15 PM      Result Value Range Status   Specimen Description BLOOD LEFT ASSIST CONTROL   Final   Special Requests BOTTLES DRAWN AEROBIC AND ANAEROBIC 3ML   Final   Culture  Setup Time 06/05/2012 23:30   Final   Culture     Final   Value:        BLOOD CULTURE RECEIVED NO GROWTH TO DATE CULTURE WILL BE HELD FOR 5 DAYS BEFORE ISSUING A FINAL  NEGATIVE REPORT   Report Status PENDING   Incomplete  CULTURE, BLOOD (ROUTINE X 2)     Status: None   Collection Time    06/09/12 12:50 PM      Result Value Range Status   Specimen Description BLOOD RIGHT ARM   Final   Special Requests     Final   Value: BOTTLES DRAWN AEROBIC AND ANAEROBIC 5CC BOTH BOTTLES   Culture  Setup Time 06/09/2012 17:32   Final   Culture     Final   Value:        BLOOD CULTURE RECEIVED NO GROWTH TO DATE CULTURE WILL BE HELD FOR 5 DAYS BEFORE ISSUING A FINAL NEGATIVE REPORT   Report Status PENDING   Incomplete  CULTURE, BLOOD (ROUTINE X 2)     Status: None   Collection Time    06/09/12 12:56 PM      Result Value Range Status   Specimen Description BLOOD RIGHT HAND   Final   Special Requests     Final   Value: BOTTLES DRAWN AEROBIC AND ANAEROBIC 2CC BOTH BOTTLES   Culture  Setup Time 06/09/2012 17:32   Final   Culture     Final   Value:        BLOOD CULTURE RECEIVED NO GROWTH TO DATE  CULTURE WILL BE HELD FOR 5 DAYS BEFORE ISSUING A FINAL NEGATIVE REPORT   Report Status PENDING   Incomplete    Studies/Results: Ct Abdomen Pelvis Wo Contrast  06/09/2012  *RADIOLOGY REPORT*  Clinical Data: Pyelonephritis with persistent fever, evaluate for abscess  CT ABDOMEN AND PELVIS WITHOUT CONTRAST  Technique:  Multidetector CT imaging of the abdomen and pelvis was performed following the standard protocol without intravenous contrast.  Comparison: Recent prior CT scan 06/05/2012  Findings:  Lower Chest:  Areas of subsegmental atelectasis versus chronic scarring noted in both lower lobes.  These regions are less conspicuous than seen on the recent prior study indicating improved inspiratory volumes.  Visualized cardiac structures within normal limits for size.  Trace pericardial fluid versus thickening anteriorly is similar to prior.  Unremarkable distal thoracic esophagus.  Abdomen: Significantly limited evaluation of the solid organs and vasculature in the absence of intravenous contrast material. Within these limitations, unremarkable noncontrasted CT appearance of the stomach and duodenum, spleen, adrenal glands and liver. Gallbladder is collapsed without evidence of cholelithiasis or intra or extrahepatic biliary ductal dilatation.  The pancreas is within normal limits.  No significant interval change in the appearance of marked right-sided hydronephrosis there is mild perinephric stranding about the kidney and renal pelvis similar to the prior study.  The proximal ureter is also dilated. Stable 3 mm nonobstructing stone in the lower pole.  A rounds 5 mm calculus on image 55 of series 2 may be within the ureter or represent a phlebolith in the gonadal vein.  This is similar to prior studies and favored to represent a phlebolith.  The left kidney is unremarkable.  Extensive postsurgical changes throughout the abdomen with a prior colectomy, right lower quadrant ileostomy and hysterectomy.  Stable  appearance of a 2.1 by 1.9 cm round low attenuation fluid collection associated with the rectal wall.  The bladder remains distended with urine.  No free fluid or adenopathy.  The Bones: No acute fracture or aggressive appearing lytic or blastic osseous lesion.  Similar appearance of levoconvex scoliosis centered at L2-L3 with extensive associated degenerative changes.  Vascular: Extremely limited evaluation in the absence of intravenous contrast material  IMPRESSION:  1.  No significant interval change in the appearance of marked right hydronephrosis and dilatation of the proximal right ureter with surrounding perinephric stranding compared to 06/05/2012. Evaluation for pyelonephritis is significantly limited in the absence of intravenous contrast material.  2.  Stable indeterminate 2.3 cm fluid collection in the low anatomic pelvis within the vaginal cuff versus rectal wall which remains incompletely evaluated but stable dating back to at least April 2013.  3.  Extensive intra-abdominal postoperative changes without evidence of obstruction.   Original Report Authenticated By: Jacqulynn Cadet, M.D.     Assessment/Plan:   Severe right hydronephrosis. Again this is presumptively been secondary to an underlying ureteral pelvic junction obstruction but the etiology has not been proven at this point. She does have a remote history of breast cancer and it is conceivable that this could be a static disease although not terribly likely. There is also a calcification in this area which is probably in a gonadal vein but could be in the ureter. Ureteral pathology such as some extrinsic compression from scarring and/or urothelial cancer is also a possibility but much less likely. At this point I do feel we need to decompress her kidney given the febrile episodes and shaking chills. She is certainly at high risk for developing urosepsis with the obstruction. Initially I discussed with her placement of the double-J stent  but upon reviewing her images further and discussing things with interventional radiology I feel she may be better off having a percutaneous nephrostomy tube placed. This would assure better drainage of that upper tract collecting system. The material is indeed obtained. This was discussed with Dr. Markus Daft from interventional radiology.  We will try to get this performed sometime later this afternoon. Her nephrostomy tube can eventually be internalized to a double-J stent. I spent approximately 25-30 minutes face-to-face with her discussing these issues.   LOS: 5 days   Enrica Corliss S 06/10/2012, 12:00 PM

## 2012-06-10 NOTE — Consult Note (Signed)
[facility]  Total days of antibiotics 6        Day 2 bactrim        Day 3 vanco        ( 3 days of gent/ 3 days of cipro)       Reason for Consult: pyelonephritis, ongoing fevers    Referring Physician: dhungal  Principal Problem:   Pyelonephritis, acute Active Problems:   History of recurrent UTIs   Hydronephrosis of right kidney   Hyponatremia   Hypokalemia   Acute kidney injury    HPI: Deanna Burke is a 62 y.o. female 62 y.o. 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 She was started on vancomycin and getn due to penicillin allergy. Her urine culture revealed proteus (pan sensitive) she was switched to bactrim 48hrs ago but still having high fevers of 102.8 in the last 24hrs.she is being evaluated for perc neph by IR in order to decompress hydronephrosis since patient still complaining of flank pain.   Past Medical History  Diagnosis Date  . Ulcerative colitis [phone]  . Pulmonary embolus 1971  . Phlebitis 1971  . Small bowel obstruction [id number]  . Small bowel perforation 1971/1996  . Large bowel perforation 1971/1996  . Scoliosis   . Torn rotator cuff 2005  . Sleep apnea 2009  . History of bone density study 2012  . Fatigue   . Cataract     Left Eye  . Hot flashes   . 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 Hypertension   . Cancer     DCIS L breast  . H/O ulcerative colitis   . Breast cancer   . Arthritis     hands, lumbar spine, hips, right ankle  . S/P ileostomy   . S/P radiation therapy 07/24/11 - 09/06/11    Left Breast/ 5000  cGy in 25 Fractions with Boost of 1000 cGy in 5 Fractions  . 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 Abnormal finding on cardiovascular stress test, ischemia anterolateral 02/13/2012  . Syncope, ? anginal equivilant 02/13/2012  . Ileostomy in place, secondary to ulcerative colitis x 20 years 02/13/2012  . History of recurrent UTIs 02/13/2012    Allergies:  Allergies  Allergen Reactions  . Demerol Nausea And Vomiting  . Dilaudid (Hydromorphone Hcl) Nausea And Vomiting and Other (See Comments)    Reaction: muscle flexion and cramping also with nausea and vomiting  . Stadol (Butorphanol Tartrate) Nausea And Vomiting  . Amoxicillin Rash  . Cephalexin Rash  . Penicillins Rash    MEDICATIONS: . aspirin EC  81 mg Oral Daily  . Bismuth Subgallate  400 mg Oral QID  . calcium carbonate  1 tablet Oral BID WC  .  ceFAZolin (ANCEF) IV  1 g Intravenous Q8H  . ciprofloxacin  400 mg Intravenous Once  . enoxaparin (LOVENOX) injection  40 mg Subcutaneous Q24H  . ferrous sulfate  325 mg Oral Q breakfast  . multivitamin with minerals  1 tablet Oral Daily  . omega-3 acid ethyl  esters  1 g Oral q morning - 10a  . potassium chloride  40 mEq Oral BID  . sodium chloride  3 mL Intravenous Q12H  . tamoxifen  20 mg Oral q morning - 10a  . vitamin C  1,000 mg Oral Daily  . vitamin E  400 Units Oral q morning - 10a    History  Substance Use Topics  . Smoking status: Never Smoker   . Smokeless tobacco: Never Used  . Alcohol Use: No    Family History  Problem Relation Age of Onset  . Breast cancer Maternal Grandmother   . Stomach cancer Maternal Grandfather   . Cancer Paternal Grandmother     stomach  . Cancer Other     Breast  . Atrial fibrillation Mother   . Heart attack Father   . Atrial fibrillation Brother   . Heart attack Paternal Uncle      Review of Systems  Constitutional: Negative for fever, chills, diaphoresis, activity change, appetite  change, fatigue and unexpected weight change.  HENT: Negative for congestion, sore throat, rhinorrhea, sneezing, trouble swallowing and sinus pressure.  Eyes: Negative for photophobia and visual disturbance.  Respiratory: Negative for cough, chest tightness, shortness of breath, wheezing and stridor.  Cardiovascular: Negative for chest pain, palpitations and leg swelling.  Gastrointestinal: Negative for nausea, vomiting, abdominal pain, diarrhea, constipation, blood in stool, abdominal distention and anal bleeding.  Genitourinary: Negative for dysuria, hematuria, flank pain and difficulty urinating.  Musculoskeletal: Negative for myalgias, back pain, joint swelling, arthralgias and gait problem.  Skin: Negative for color change, pallor, rash and wound.  Neurological: Negative for dizziness, tremors, weakness and light-headedness.  Hematological: Negative for adenopathy. Does not bruise/bleed easily.  Psychiatric/Behavioral: Negative for behavioral problems, confusion, sleep disturbance, dysphoric mood, decreased concentration and agitation.     OBJECTIVE: Temp:  [98.8 F (37.1 C)-100.9 F (38.3 C)] 99.5 F (37.5 C) (02/17 0605) Pulse Rate:  [99-122] 99 (02/17 0605) Resp:  [20] 20 (02/17 0605) BP: (94-118)/(54-59) 94/59 mmHg (02/17 0605) SpO2:  [96 %-100 %] 96 % (02/17 0605) General: Average weight caucasian female, no acute distress, lying on stretcher  Eyes: PERRL, anicteric, non-injected.  ENT: OP clear, non-erythematous without plaques or exudates. MMM.  Neck: Supple without TM or JVD.  Lymph: No cervical, supraclavicular, or submandibular LAD.  Cardiovascular: RRR without m/r/g. 2+ pulses  Respiratory: CTA bilaterally without increased WOB.  Abdomen: Hyperactive BS. Soft, TTP in the right upper quadrant, epigastrium, and right lower quadrant without rebound or guarding. Ileostomy in right abdomen with green liquid stool.  Skin: No rashes or focal lesions.  Musculoskeletal: Normal  bulk and tone. No LE edema.  Psychiatric: A & O x 4. Appropriate affect.  Neurologic: CN 3-12 intact. 5/5 strength. Sensation intact.    LABS: Results for orders placed during the hospital encounter of 06/05/12 (from the past 48 hour(s))  CULTURE, BLOOD (ROUTINE X 2)     Status: None   Collection Time    06/09/12 12:50 PM      Result Value Range   Specimen Description BLOOD RIGHT ARM     Special Requests       Value: BOTTLES DRAWN AEROBIC AND ANAEROBIC 5CC BOTH BOTTLES   Culture  Setup Time 06/09/2012 17:32     Culture       Value:        BLOOD CULTURE RECEIVED NO GROWTH TO DATE CULTURE WILL BE HELD FOR 5 DAYS BEFORE ISSUING A FINAL NEGATIVE REPORT  Report Status PENDING    CULTURE, BLOOD (ROUTINE X 2)     Status: None   Collection Time    06/09/12 12:56 PM      Result Value Range   Specimen Description BLOOD RIGHT HAND     Special Requests       Value: BOTTLES DRAWN AEROBIC AND ANAEROBIC 2CC BOTH BOTTLES   Culture  Setup Time 06/09/2012 17:32     Culture       Value:        BLOOD CULTURE RECEIVED NO GROWTH TO DATE CULTURE WILL BE HELD FOR 5 DAYS BEFORE ISSUING A FINAL NEGATIVE REPORT   Report Status PENDING    CBC WITH DIFFERENTIAL     Status: Abnormal   Collection Time    06/10/12 10:40 AM      Result Value Range   WBC 7.4  4.0 - 10.5 K/uL   RBC 3.05 (*) 3.87 - 5.11 MIL/uL   Hemoglobin 9.5 (*) 12.0 - 15.0 g/dL   HCT 27.3 (*) 36.0 - 46.0 %   MCV 89.5  78.0 - 100.0 fL   MCH 31.1  26.0 - 34.0 pg   MCHC 34.8  30.0 - 36.0 g/dL   RDW 14.4  11.5 - 15.5 %   Platelets 447 (*) 150 - 400 K/uL   Neutrophils Relative 88 (*) 43 - 77 %   Neutro Abs 6.5  1.7 - 7.7 K/uL   Lymphocytes Relative 6 (*) 12 - 46 %   Lymphs Abs 0.5 (*) 0.7 - 4.0 K/uL   Monocytes Relative 5  3 - 12 %   Monocytes Absolute 0.4  0.1 - 1.0 K/uL   Eosinophils Relative 0  0 - 5 %   Eosinophils Absolute 0.0  0.0 - 0.7 K/uL   Basophils Relative 1  0 - 1 %   Basophils Absolute 0.0  0.0 - 0.1 K/uL     MICRO: 2/16 blood cx x 2 NGTD 2/12 blood cx x 2 NGTD 2/12 urine cx proteus (R nitrofurantoin but pan sensitive otherwise) IMAGING: Ct Abdomen Pelvis Wo Contrast  06/09/2012  *RADIOLOGY REPORT*  Clinical Data: Pyelonephritis with persistent fever, evaluate for abscess  CT ABDOMEN AND PELVIS WITHOUT CONTRAST  Technique:  Multidetector CT imaging of the abdomen and pelvis was performed following the standard protocol without intravenous contrast.  Comparison: Recent prior CT scan 06/05/2012  Findings:  Lower Chest:  Areas of subsegmental atelectasis versus chronic scarring noted in both lower lobes.  These regions are less conspicuous than seen on the recent prior study indicating improved inspiratory volumes.  Visualized cardiac structures within normal limits for size.  Trace pericardial fluid versus thickening anteriorly is similar to prior.  Unremarkable distal thoracic esophagus.  Abdomen: Significantly limited evaluation of the solid organs and vasculature in the absence of intravenous contrast material. Within these limitations, unremarkable noncontrasted CT appearance of the stomach and duodenum, spleen, adrenal glands and liver. Gallbladder is collapsed without evidence of cholelithiasis or intra or extrahepatic biliary ductal dilatation.  The pancreas is within normal limits.  No significant interval change in the appearance of marked right-sided hydronephrosis there is mild perinephric stranding about the kidney and renal pelvis similar to the prior study.  The proximal ureter is also dilated. Stable 3 mm nonobstructing stone in the lower pole.  A rounds 5 mm calculus on image 55 of series 2 may be within the ureter or represent a phlebolith in the gonadal vein.  This is similar to prior studies  and favored to represent a phlebolith.  The left kidney is unremarkable.  Extensive postsurgical changes throughout the abdomen with a prior colectomy, right lower quadrant ileostomy and hysterectomy.   Stable appearance of a 2.1 by 1.9 cm round low attenuation fluid collection associated with the rectal wall.  The bladder remains distended with urine.  No free fluid or adenopathy.  The Bones: No acute fracture or aggressive appearing lytic or blastic osseous lesion.  Similar appearance of levoconvex scoliosis centered at L2-L3 with extensive associated degenerative changes.  Vascular: Extremely limited evaluation in the absence of intravenous contrast material  IMPRESSION:  1.  No significant interval change in the appearance of marked right hydronephrosis and dilatation of the proximal right ureter with surrounding perinephric stranding compared to 06/05/2012. Evaluation for pyelonephritis is significantly limited in the absence of intravenous contrast material.  2.  Stable indeterminate 2.3 cm fluid collection in the low anatomic pelvis within the vaginal cuff versus rectal wall which remains incompletely evaluated but stable dating back to at least April 2013.  3.  Extensive intra-abdominal postoperative changes without evidence of obstruction.   Original Report Authenticated By: Jacqulynn Cadet, M.D.     Assessment/Plan:  62 yo F with pyelonephritis with proteus mirabilis, no improved on antibiotics initiated on 2/12, fever curve appears to be slowly trending down, although had high fever in the last 24hr.  - agree with primary team to decompress hydronephrosis with perc neph, please repeat urine cultures  - change antibiotics to cefazolin 2gm Iv Q8hr. Appears rash mainly with penicillins. Will see if she tolerates cefazolin. Would still reserve fluoroquinolones as last chance since risk for also causing c.difficile and proteus often can become resistant to FQs easily  Adasyn Mcadams B. [facility] [phone]

## 2012-06-10 NOTE — Progress Notes (Signed)
1240 report called  To jamie RN  Transferred to [id number] per [facility]

## 2012-06-10 NOTE — Anesthesia Preprocedure Evaluation (Deleted)
Anesthesia Evaluation  Patient identified by MRN, date of birth, ID band Patient awake    Reviewed: Allergy & Precautions, H&P , NPO status , Patient's Chart, lab work & pertinent test results  Airway Mallampati: II TM Distance: >3 FB Neck ROM: Full    Dental no notable dental hx.    Pulmonary sleep apnea ,  breath sounds clear to auscultation  Pulmonary exam normal       Cardiovascular hypertension, Pt. on medications Rhythm:Regular Rate:Normal     Neuro/Psych negative neurological ROS  negative psych ROS   GI/Hepatic Neg liver ROS, UC   Endo/Other  negative endocrine ROS  Renal/GU negative Renal ROS  negative genitourinary   Musculoskeletal negative musculoskeletal ROS (+)   Abdominal   Peds negative pediatric ROS (+)  Hematology  (+) Blood dyscrasia, anemia ,   Anesthesia Other Findings   Reproductive/Obstetrics negative OB ROS                           Anesthesia Physical Anesthesia Plan  ASA: III  Anesthesia Plan: General   Post-op Pain Management:    Induction: Intravenous  Airway Management Planned: Oral ETT  Additional Equipment:   Intra-op Plan:   Post-operative Plan: Extubation in OR  Informed Consent: I have reviewed the patients History and Physical, chart, labs and discussed the procedure including the risks, benefits and alternatives for the proposed anesthesia with the patient or authorized representative who has indicated his/her understanding and acceptance.   Dental advisory given  Plan Discussed with: CRNA and Surgeon  Anesthesia Plan Comments:         Anesthesia Quick Evaluation

## 2012-06-10 NOTE — Progress Notes (Signed)
[facility] PROGRESS NOTE  Deanna Burke RFF:[id number] DOB: [birth year] DOA: 06/05/2012 PCP: [name], MD  Brief narrative:  62 y.o. 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   Assessment/Plan:  Acute pyelonephritis  Patient initially started on IV vancomycin and gentamicin. Urine culture growing Proteus and switched to Bactrim after discussion with Dr. Megan Salon. Has hx of recurrent UTI , on microbid at home.  -Patient has been febrile for past 4 days. Blood culture negative.  Repeated CT abd and shows persistent hydronephrosis with perinephric stranding. reevaluated by urology this am and  plan on decompression with a nephrostomy tube by IR  Hypokalemia  Replenished   AKI  On presentation likely prerenal and associated pyelonephritis  Hold triamterine-HCTZ . Renal function improved with hydration. Still has some hyponatremia which likely is secondary to dehydration and being on diuretics. Now improved.   Hyponatremia  likey from dehydration and being on diuretics  Improved with hydration   DVT prophylaxis  Code Status: full  Family Communication: none at bedside  Disposition Plan: home once stable    Consultants:  Urology  ID  Procedures:  Planned for percutaneous nephrostomy today  Antibiotics: IV vanco and genta (2/12--2/14)  Po cipro ( 2/14- 2/15)  po bactrim ( 2/15>)  IV ancef ( 2/17>>)   HPI/Subjective: Continues to  have temp spikes. Tmax of 102.8 F  Objective: Filed Vitals:   06/09/12 1900 06/09/12 2048 06/09/12 2200 06/10/12 0605  BP:   118/54 94/59  Pulse:   122 99  Temp: 98.8 F (37.1 C) 100.9 F (38.3 C) 100.7 F (38.2 C) 99.5 F (37.5 C)  TempSrc: Oral Oral Oral Oral  Resp:   20 20  Height:      Weight:      SpO2:    100% 96%    Intake/Output Summary (Last 24 hours) at 06/10/12 1442 Last data filed at 06/10/12 0900  Gross per 24 hour  Intake    250 ml  Output      1 ml  Net    249 ml   Filed Weights   06/05/12 1822  Weight: 74.39 kg (164 lb)    Exam:  General: Elderly female in NAD  HEENT: no pallor, moist oral mucosa  Cardiovascular: N S1& S2, no murmurs  Respiratory: clear b/l, no murmurs  Abdomen: soft, ND, BS+,  right CVA tenderness persists, ileostomy in place.  Ext: warm, no edema  CNS: AAOX3    Data Reviewed: Basic Metabolic Panel:  Recent Labs Lab 06/05/12 1455 06/06/12 0456 06/07/12 0835 06/08/12 0539  NA 127* 130* 128* 132*  K 2.9* 2.9* 3.1* 4.2  CL 86* 94* 93* 101  CO2 21 24 22 20   GLUCOSE 101* 110* 149* 106*  BUN 41* 35* 23 22  CREATININE 1.42* 1.27* 1.05 1.03  CALCIUM 9.7 8.1* 8.7 8.8  MG  --  2.1  --   --    Liver Function Tests:  Recent Labs Lab 06/05/12 1455  AST 15  ALT 9  ALKPHOS 66  BILITOT 0.9  PROT 7.9  ALBUMIN 2.7*    Recent Labs Lab 06/05/12 1455  LIPASE 33   No results found for this basename: AMMONIA,  in the last 168 hours CBC:  Recent Labs Lab 06/05/12 1455 06/06/12 0456 06/07/12 0835 06/10/12 1040  WBC 6.5 5.5 5.9 7.4  NEUTROABS 5.6  --   --  6.5  HGB 12.2 10.4* 10.1* 9.5*  HCT 34.8* 30.3* 29.6* 27.3*  MCV 87.0 88.3 87.8 89.5  PLT 227 214 248 447*   Cardiac Enzymes: No results found for this basename: CKTOTAL, CKMB, CKMBINDEX, TROPONINI,  in the last 168 hours BNP (last 3 results) No results found for this basename: PROBNP,  in the last 8760 hours CBG: No results found for this basename: GLUCAP,  in the last 168 hours  Recent Results (from the past 240 hour(s))  URINE CULTURE     Status: None   Collection Time    06/05/12  2:39 PM      Result Value Range Status   Specimen Description URINE, CLEAN CATCH   Final   Special Requests NONE   Final   Culture  Setup Time 06/06/2012 01:31   Final   Colony Count  >=100,000 COLONIES/ML   Final   Culture PROTEUS MIRABILIS   Final   Report Status 06/07/2012 FINAL   Final   Organism ID, Bacteria PROTEUS MIRABILIS   Final  CULTURE, BLOOD (ROUTINE X 2)     Status: None   Collection Time    06/05/12  6:09 PM      Result Value Range Status   Specimen Description BLOOD LEFT HAND   Final   Special Requests BOTTLES DRAWN AEROBIC AND ANAEROBIC 5ML   Final   Culture  Setup Time 06/05/2012 23:30   Final   Culture     Final   Value:        BLOOD CULTURE RECEIVED NO GROWTH TO DATE CULTURE WILL BE HELD FOR 5 DAYS BEFORE ISSUING A FINAL NEGATIVE REPORT   Report Status PENDING   Incomplete  CULTURE, BLOOD (ROUTINE X 2)     Status: None   Collection Time    06/05/12  6:15 PM      Result Value Range Status   Specimen Description BLOOD LEFT ASSIST CONTROL   Final   Special Requests BOTTLES DRAWN AEROBIC AND ANAEROBIC 3ML   Final   Culture  Setup Time 06/05/2012 23:30   Final   Culture     Final   Value:        BLOOD CULTURE RECEIVED NO GROWTH TO DATE CULTURE WILL BE HELD FOR 5 DAYS BEFORE ISSUING A FINAL NEGATIVE REPORT   Report Status PENDING   Incomplete  CULTURE, BLOOD (ROUTINE X 2)     Status: None   Collection Time    06/09/12 12:50 PM      Result Value Range Status   Specimen Description BLOOD RIGHT ARM   Final   Special Requests     Final   Value: BOTTLES DRAWN AEROBIC AND ANAEROBIC 5CC BOTH BOTTLES   Culture  Setup Time 06/09/2012 17:32   Final   Culture     Final   Value:        BLOOD CULTURE RECEIVED NO GROWTH TO DATE CULTURE WILL BE HELD FOR 5 DAYS BEFORE ISSUING A FINAL NEGATIVE REPORT   Report Status PENDING   Incomplete  CULTURE, BLOOD (ROUTINE X 2)     Status: None   Collection Time    06/09/12 12:56 PM      Result Value Range Status   Specimen Description BLOOD RIGHT HAND   Final   Special Requests     Final   Value: BOTTLES DRAWN AEROBIC AND ANAEROBIC 2CC BOTH BOTTLES   Culture  Setup Time 06/09/2012 17:32   Final   Culture     Final    Value:        BLOOD CULTURE RECEIVED NO GROWTH TO DATE CULTURE WILL BE HELD FOR 5 DAYS BEFORE ISSUING A FINAL NEGATIVE REPORT   Report Status PENDING   Incomplete     Studies: Ct Abdomen Pelvis Wo Contrast  06/09/2012  *RADIOLOGY REPORT*  Clinical Data: Pyelonephritis with persistent fever, evaluate for abscess  CT ABDOMEN AND PELVIS WITHOUT CONTRAST  Technique:  Multidetector CT imaging of the abdomen and pelvis was performed following the standard protocol without intravenous contrast.  Comparison: Recent prior CT scan 06/05/2012  Findings:  Lower Chest:  Areas of subsegmental atelectasis versus chronic scarring noted in both lower lobes.  These regions are less conspicuous than seen on the recent prior study indicating improved inspiratory volumes.  Visualized cardiac structures within normal limits for size.  Trace pericardial fluid versus thickening anteriorly is similar to prior.  Unremarkable distal thoracic esophagus.  Abdomen: Significantly limited evaluation of the solid organs and vasculature in the absence of intravenous contrast material. Within these limitations, unremarkable noncontrasted CT appearance of the stomach and duodenum, spleen, adrenal glands and liver. Gallbladder is collapsed without evidence of cholelithiasis or intra or extrahepatic biliary ductal dilatation.  The pancreas is within normal limits.  No significant interval change in the appearance of marked right-sided hydronephrosis there is mild perinephric stranding about the kidney and renal pelvis similar to the prior study.  The proximal ureter is also dilated. Stable 3 mm nonobstructing stone in the lower pole.  A rounds 5 mm calculus on image 55 of series 2 may be within the ureter or represent a phlebolith in the gonadal vein.  This is similar to prior studies and favored to represent a phlebolith.  The left kidney is unremarkable.  Extensive postsurgical changes throughout the abdomen with a prior colectomy, right lower  quadrant ileostomy and hysterectomy.  Stable appearance of a 2.1 by 1.9 cm round low attenuation fluid collection associated with the rectal wall.  The bladder remains distended with urine.  No free fluid or adenopathy.  The Bones: No acute fracture or aggressive appearing lytic or blastic osseous lesion.  Similar appearance of levoconvex scoliosis centered at L2-L3 with extensive associated degenerative changes.  Vascular: Extremely limited evaluation in the absence of intravenous contrast material  IMPRESSION:  1.  No significant interval change in the appearance of marked right hydronephrosis and dilatation of the proximal right ureter with surrounding perinephric stranding compared to 06/05/2012. Evaluation for pyelonephritis is significantly limited in the absence of intravenous contrast material.  2.  Stable indeterminate 2.3 cm fluid collection in the low anatomic pelvis within the vaginal cuff versus rectal wall which remains incompletely evaluated but stable dating back to at least April 2013.  3.  Extensive intra-abdominal postoperative changes without evidence of obstruction.   Original Report Authenticated By: Jacqulynn Cadet, M.D.     Scheduled Meds: . aspirin EC  81 mg Oral Daily  . Bismuth Subgallate  400 mg Oral QID  . calcium carbonate  1 tablet Oral BID WC  .  ceFAZolin (ANCEF) IV  1 g Intravenous Q8H  . ciprofloxacin  400 mg Intravenous Once  . enoxaparin (LOVENOX) injection  40 mg Subcutaneous Q24H  . ferrous sulfate  325 mg Oral Q breakfast  . multivitamin with minerals  1 tablet Oral Daily  . omega-3 acid ethyl esters  1 g Oral q morning - 10a  .  potassium chloride  40 mEq Oral BID  . sodium chloride  3 mL Intravenous Q12H  . tamoxifen  20 mg Oral q morning - 10a  . vitamin C  1,000 mg Oral Daily  . vitamin E  400 Units Oral q morning - 10a   Continuous Infusions:     Time spent: 25 minutes    Elyse Prevo, [place]  [facility] Pager [phone] If 8PM-8AM, please  contact night-coverage at [email or web address], password [facility] 06/10/2012, 2:42 PM  LOS: 5 days

## 2012-06-11 LAB — CULTURE, BLOOD (ROUTINE X 2): Culture: NO GROWTH

## 2012-06-11 LAB — BASIC METABOLIC PANEL
Chloride: 97 mEq/L (ref 96–112)
GFR calc Af Amer: 49 mL/min — ABNORMAL LOW (ref >90)
Potassium: 4.1 mEq/L (ref 3.5–5.1)

## 2012-06-11 LAB — CBC WITH DIFFERENTIAL/PLATELET
Basophils Absolute: 0 10*3/uL (ref 0.0–0.1)
Basophils Relative: 0 % (ref 0–1)
MCHC: 33.1 g/dL (ref 30.0–36.0)
Neutro Abs: 7.5 10*3/uL (ref 1.7–7.7)
Neutrophils Relative %: 88 % — ABNORMAL HIGH (ref 43–77)
RDW: 14.3 % (ref 11.5–15.5)

## 2012-06-11 MED ORDER — CEFAZOLIN SODIUM-DEXTROSE 2-3 GM-% IV SOLR
2.0000 g | Freq: Three times a day (TID) | INTRAVENOUS | Status: DC
  Administered 2012-06-11 – 2012-06-15 (×12): 2 g via INTRAVENOUS
  Filled 2012-06-11 (×13): qty 50

## 2012-06-11 NOTE — Progress Notes (Signed)
1 Day Post-Op  Subjective: Pt feeling a little better today; has some mild rt flank discomfort  Objective: Vital signs in last 24 hours: Temp:  [98.9 F (37.2 C)-99.6 F (37.6 C)] 99.6 F (37.6 C) (02/18 0552) Pulse Rate:  [95-111] 95 (02/18 0552) Resp:  [20-26] 20 (02/18 0552) BP: (102-124)/(50-68) 102/50 mmHg (02/18 0552) SpO2:  [97 %-100 %] 97 % (02/18 0552) Last BM Date: 06/11/12  Intake/Output from previous day: 02/17 0701 - 02/18 0700 In: 240 [P.O.:240] Out: 201 [Urine:201] Intake/Output this shift:   Rt PCN intact, insertion site ok, output 200 cc's blood-tinged urine today; drain flushed with 5 cc's sterile NS, cx's pend  Lab Results:   Recent Labs  06/10/12 1040 06/11/12 0815  WBC 7.4 8.5  HGB 9.5* 8.8*  HCT 27.3* 26.6*  PLT 447* 451*   BMET  Recent Labs  06/11/12 0815  NA 132*  K 4.1  CL 97  CO2 21  GLUCOSE 105*  BUN 16  CREATININE 1.32*  CALCIUM 8.9   PT/INR No results found for this basename: LABPROT, INR,  in the last 72 hours ABG No results found for this basename: PHART, PCO2, PO2, HCO3,  in the last 72 hours  Studies/Results: Ct Abdomen Pelvis Wo Contrast  06/09/2012  *RADIOLOGY REPORT*  Clinical Data: Pyelonephritis with persistent fever, evaluate for abscess  CT ABDOMEN AND PELVIS WITHOUT CONTRAST  Technique:  Multidetector CT imaging of the abdomen and pelvis was performed following the standard protocol without intravenous contrast.  Comparison: Recent prior CT scan 06/05/2012  Findings:  Lower Chest:  Areas of subsegmental atelectasis versus chronic scarring noted in both lower lobes.  These regions are less conspicuous than seen on the recent prior study indicating improved inspiratory volumes.  Visualized cardiac structures within normal limits for size.  Trace pericardial fluid versus thickening anteriorly is similar to prior.  Unremarkable distal thoracic esophagus.  Abdomen: Significantly limited evaluation of the solid organs and  vasculature in the absence of intravenous contrast material. Within these limitations, unremarkable noncontrasted CT appearance of the stomach and duodenum, spleen, adrenal glands and liver. Gallbladder is collapsed without evidence of cholelithiasis or intra or extrahepatic biliary ductal dilatation.  The pancreas is within normal limits.  No significant interval change in the appearance of marked right-sided hydronephrosis there is mild perinephric stranding about the kidney and renal pelvis similar to the prior study.  The proximal ureter is also dilated. Stable 3 mm nonobstructing stone in the lower pole.  A rounds 5 mm calculus on image 55 of series 2 may be within the ureter or represent a phlebolith in the gonadal vein.  This is similar to prior studies and favored to represent a phlebolith.  The left kidney is unremarkable.  Extensive postsurgical changes throughout the abdomen with a prior colectomy, right lower quadrant ileostomy and hysterectomy.  Stable appearance of a 2.1 by 1.9 cm round low attenuation fluid collection associated with the rectal wall.  The bladder remains distended with urine.  No free fluid or adenopathy.  The Bones: No acute fracture or aggressive appearing lytic or blastic osseous lesion.  Similar appearance of levoconvex scoliosis centered at L2-L3 with extensive associated degenerative changes.  Vascular: Extremely limited evaluation in the absence of intravenous contrast material  IMPRESSION:  1.  No significant interval change in the appearance of marked right hydronephrosis and dilatation of the proximal right ureter with surrounding perinephric stranding compared to 06/05/2012. Evaluation for pyelonephritis is significantly limited in the absence of intravenous  contrast material.  2.  Stable indeterminate 2.3 cm fluid collection in the low anatomic pelvis within the vaginal cuff versus rectal wall which remains incompletely evaluated but stable dating back to at least April  2013.  3.  Extensive intra-abdominal postoperative changes without evidence of obstruction.   Original Report Authenticated By: Jacqulynn Cadet, M.D.    Ir Perc Nephrostomy Right  06/10/2012  *RADIOLOGY REPORT*  Clinical history:62 year-old with fevers and chronic right hydronephrosis.  PROCEDURE(S): PERCUTANEOUS RIGHT NEPHROSTOMY TUBE WITH ULTRASOUND AND FLUOROSCOPIC GUIDANCE  Physician: Stephan Minister. Anselm Pancoast, MD  Medications:Ciprofloxacin 400 mg IV.  Versed 2 mg, Fentanyl 100 mcg.  A radiology nurse monitored the patient for moderate sedation.  Ciprofloxacin was given within two hours of incision.  Moderate sedation time:15 minutes  Fluoroscopy time: 0.7 minutes  Procedure:The procedure was explained to the patient.  The risks and benefits of the procedure were discussed and the patient's questions were addressed.  Informed consent was obtained from the patient.  The patient was placed prone.  Ultrasound demonstrated an enlarged right renal collecting system.  The right flank was prepped and draped in a sterile fashion.  Maximal barrier sterile technique was utilized including caps, mask, sterile gowns, sterile gloves, sterile drape, hand hygiene and skin antiseptic.  Skin and soft tissues were anesthetized with lidocaine.  The 21 gauge needle was directed into the dilated upper pole collecting system.  Urine was identified in the needle.  A 0.018 wire was advanced into the renal collecting system and an Accustick dilator set was placed. Thick cloudy yellow fluid was aspirated.  A J wire was advanced into the collecting system and the tract was dilated to 10-French. 10.2 [place] multipurpose drain was reconstituted in the renal collecting system.  280 ml of the thick yellow fluid was removed. Catheter sutured to the skin. Fluoroscopic and ultrasound images were taken and saved for documentation.  Findings:Massive right hydronephrosis.  The upper pole collecting system was targeted and used for access.  280 ml of thick  yellow fluid was removed.  Sample was sent for culture.  Complications: None  Impression:Successful percutaneous right nephrostomy tube placement.  280 ml of thick yellow fluid was removed.  Findings are concerning for pyonephrosis.  Sample sent for gram stain and culture.   Original Report Authenticated By: Markus Daft, M.D.    Ir US Guide Bx Asp/drain  06/10/2012  *RADIOLOGY REPORT*  Clinical history:62 year-old with fevers and chronic right hydronephrosis.  PROCEDURE(S): PERCUTANEOUS RIGHT NEPHROSTOMY TUBE WITH ULTRASOUND AND FLUOROSCOPIC GUIDANCE  Physician: Stephan Minister. Anselm Pancoast, MD  Medications:Ciprofloxacin 400 mg IV.  Versed 2 mg, Fentanyl 100 mcg.  A radiology nurse monitored the patient for moderate sedation.  Ciprofloxacin was given within two hours of incision.  Moderate sedation time:15 minutes  Fluoroscopy time: 0.7 minutes  Procedure:The procedure was explained to the patient.  The risks and benefits of the procedure were discussed and the patient's questions were addressed.  Informed consent was obtained from the patient.  The patient was placed prone.  Ultrasound demonstrated an enlarged right renal collecting system.  The right flank was prepped and draped in a sterile fashion.  Maximal barrier sterile technique was utilized including caps, mask, sterile gowns, sterile gloves, sterile drape, hand hygiene and skin antiseptic.  Skin and soft tissues were anesthetized with lidocaine.  The 21 gauge needle was directed into the dilated upper pole collecting system.  Urine was identified in the needle.  A 0.018 wire was advanced into the renal collecting system  and an Accustick dilator set was placed. Thick cloudy yellow fluid was aspirated.  A J wire was advanced into the collecting system and the tract was dilated to 10-French. 10.2 [place] multipurpose drain was reconstituted in the renal collecting system.  280 ml of the thick yellow fluid was removed. Catheter sutured to the skin. Fluoroscopic and ultrasound  images were taken and saved for documentation.  Findings:Massive right hydronephrosis.  The upper pole collecting system was targeted and used for access.  280 ml of thick yellow fluid was removed.  Sample was sent for culture.  Complications: None  Impression:Successful percutaneous right nephrostomy tube placement.  280 ml of thick yellow fluid was removed.  Findings are concerning for pyonephrosis.  Sample sent for gram stain and culture.   Original Report Authenticated By: Markus Daft, M.D.     Anti-infectives: Anti-infectives   Start     Dose/Rate Route Frequency Ordered Stop   06/11/12 1200  ceFAZolin (ANCEF) IVPB 2 g/50 mL premix     2 g 100 mL/hr over 30 Minutes Intravenous Every 8 hours 06/11/12 0729     06/10/12 1430  ciprofloxacin (CIPRO) IVPB 400 mg     400 mg 200 mL/hr over 60 Minutes Intravenous  Once 06/10/12 1413 06/10/12 1541   06/10/12 1200  ceFAZolin (ANCEF) IVPB 1 g/50 mL premix  Status:  Discontinued     1 g 100 mL/hr over 30 Minutes Intravenous Every 8 hours 06/10/12 1002 06/11/12 0729   06/08/12 1100  sulfamethoxazole-trimethoprim (BACTRIM DS) 800-160 MG per tablet 1 tablet  Status:  Discontinued     1 tablet Oral Every 12 hours 06/08/12 1033 06/10/12 1002   06/08/12 0800  ciprofloxacin (CIPRO) tablet 500 mg  Status:  Discontinued     500 mg Oral 2 times daily 06/08/12 0733 06/08/12 1033   06/06/12 0800  gentamicin (GARAMYCIN) IVPB 100 mg  Status:  Discontinued     100 mg 200 mL/hr over 30 Minutes Intravenous Every 12 hours 06/05/12 1902 06/08/12 0733   06/05/12 2000  vancomycin (VANCOCIN) IVPB 1000 mg/200 mL premix  Status:  Discontinued     1,000 mg 200 mL/hr over 60 Minutes Intravenous Every 24 hours 06/05/12 1859 06/08/12 0733   06/05/12 2000  gentamicin (GARAMYCIN) 130 mg in dextrose 5 % 50 mL IVPB     130 mg 106.5 mL/hr over 30 Minutes Intravenous  Once 06/05/12 1900 06/05/12 2004      Assessment/Plan: s/p right PCN 2/17 secondary to  hydronephrosis/pyelonephritis; check final cx's; monitor labs, other plans as outlined by Dr. Risa Grill.   LOS: 6 days    Deanna Burke,D [facility] 06/11/2012

## 2012-06-11 NOTE — Progress Notes (Addendum)
[facility] PROGRESS NOTE  Deanna Burke OIP:[id number] DOB: 1950/09/01 DOA: 06/05/2012 PCP: [name], MD  Brief narrative:  62 y.o. 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   Assessment/Plan:  Acute pyelonephritis  Patient initially started on IV vancomycin and gentamicin. Urine culture growing Proteus and switched to Bactrim after discussion with Dr. Megan Salon. Has hx of recurrent UTI , on microbid at home.  -Patient has been febrile for past 4 days. Blood culture negative.  Repeated CT abd and shows persistent hydronephrosis with perinephric stranding.  -Discussed with ID again given just and fever and recommended to switch to Ancef 2 g 8 times a day. reevaluated by urology and had decompression with a nephrostomy tube by IR on 2/17 - feels overall better after the procedure. -Urology recommend IR guided antegrade nephrostogram study in the next couple of days followed by hopeful internalization of her nephrostomy tube to a double-J stent. A living baby is no UPJ obstruction plan is for renal function via a renal scan and then determine if she needs nephrectomy versus reconstruction .  Hypokalemia  Replenished   AKI  On presentation  likely prerenal and associated pyelonephritis  Hold triamterine-HCTZ . Renal function improved with hydration. Still has some hyponatremia which likely is secondary to dehydration and being on diuretics. Now improved.   Hyponatremia  likey from dehydration and being on diuretics  Improved with hydration   DVT prophylaxis   Code Status: full  Family Communication: none at bedside  Disposition Plan: home once stable   Consultants:  Urology (Dr. Risa Grill) ID (Dr. Graylon Good) IR Procedures:  IR guided percutaneous nephrostomy on  2/17   HPI/Subjective: Patient feels better after nephrostomy tube placed for decompression yesterday. Afebrile  Objective: Filed Vitals:   06/10/12 1626 06/10/12 1633 06/10/12 2041 06/11/12 0552  BP: 112/68 115/66 124/58 102/50  Pulse: 107 111 109 95  Temp:   98.9 F (37.2 C) 99.6 F (37.6 C)  TempSrc:   Oral Oral  Resp: 22 22 20 20   Height:      Weight:      SpO2: 98% 100% 98% 97%    Intake/Output Summary (Last 24 hours) at 06/11/12 1441 Last data filed at 06/11/12 1200  Gross per 24 hour  Intake    100 ml  Output    200 ml  Net   -100 ml   Filed Weights   06/05/12 1822  Weight: 74.39 kg (164 lb)    Exam:  General: Elderly female in NAD  HEENT: no pallor, moist oral mucosa  Cardiovascular: N S1& S2, no murmurs  Respiratory: clear b/l, no murmurs  Abdomen: soft, ND, BS+, nephrostomy tube over right draining.urine, 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  Ext: warm, no edema ileostomy in place.  CNS: AAOX3    Data Reviewed: Basic Metabolic Panel:  Recent Labs Lab 06/05/12 1455 06/06/12 0456 06/07/12 0835 06/08/12 0539 06/11/12 0815  NA 127* 130* 128* 132* 132*  K 2.9* 2.9* 3.1* 4.2 4.1  CL 86* 94* 93* 101 97  CO2 21 24 22 20 21   GLUCOSE 101* 110* 149* 106* 105*  BUN 41* 35* 23 22 16   CREATININE 1.42* 1.27* 1.05 1.03 1.32*  CALCIUM 9.7 8.1* 8.7 8.8 8.9  MG  --  2.1  --   --   --  Liver Function Tests:  Recent Labs Lab 06/05/12 1455  AST 15  ALT 9  ALKPHOS 66  BILITOT 0.9  PROT 7.9  ALBUMIN 2.7*    Recent Labs Lab 06/05/12 1455  LIPASE 33   No results found for this basename: AMMONIA,  in the last 168 hours CBC:  Recent Labs Lab 06/05/12 1455 06/06/12 0456 06/07/12 0835 06/10/12 1040 06/11/12 0815  WBC 6.5 5.5 5.9 7.4 8.5  NEUTROABS 5.6  --   --  6.5 7.5  HGB 12.2 10.4* 10.1* 9.5* 8.8*  HCT 34.8* 30.3* 29.6* 27.3* 26.6*  MCV 87.0 88.3 87.8 89.5 89.3  PLT 227 214 248 447* 451*   Cardiac Enzymes: No results found for this basename:  CKTOTAL, CKMB, CKMBINDEX, TROPONINI,  in the last 168 hours BNP (last 3 results) No results found for this basename: PROBNP,  in the last 8760 hours CBG: No results found for this basename: GLUCAP,  in the last 168 hours  Recent Results (from the past 240 hour(s))  URINE CULTURE     Status: None   Collection Time    06/05/12  2:39 PM      Result Value Range Status   Specimen Description URINE, CLEAN CATCH   Final   Special Requests NONE   Final   Culture  Setup Time 06/06/2012 01:31   Final   Colony Count >=100,000 COLONIES/ML   Final   Culture PROTEUS MIRABILIS   Final   Report Status 06/07/2012 FINAL   Final   Organism ID, Bacteria PROTEUS MIRABILIS   Final  CULTURE, BLOOD (ROUTINE X 2)     Status: None   Collection Time    06/05/12  6:09 PM      Result Value Range Status   Specimen Description BLOOD LEFT HAND   Final   Special Requests BOTTLES DRAWN AEROBIC AND ANAEROBIC 5ML   Final   Culture  Setup Time 06/05/2012 23:30   Final   Culture NO GROWTH 5 DAYS   Final   Report Status 06/11/2012 FINAL   Final  CULTURE, BLOOD (ROUTINE X 2)     Status: None   Collection Time    06/05/12  6:15 PM      Result Value Range Status   Specimen Description BLOOD LEFT ASSIST CONTROL   Final   Special Requests BOTTLES DRAWN AEROBIC AND ANAEROBIC 3ML   Final   Culture  Setup Time 06/05/2012 23:30   Final   Culture NO GROWTH 5 DAYS   Final   Report Status 06/11/2012 FINAL   Final  CULTURE, BLOOD (ROUTINE X 2)     Status: None   Collection Time    06/09/12 12:50 PM      Result Value Range Status   Specimen Description BLOOD RIGHT ARM   Final   Special Requests     Final   Value: BOTTLES DRAWN AEROBIC AND ANAEROBIC 5CC BOTH BOTTLES   Culture  Setup Time 06/09/2012 17:32   Final   Culture     Final   Value:        BLOOD CULTURE RECEIVED NO GROWTH TO DATE CULTURE WILL BE HELD FOR 5 DAYS BEFORE ISSUING A FINAL NEGATIVE REPORT   Report Status PENDING   Incomplete  CULTURE, BLOOD (ROUTINE X 2)      Status: None   Collection Time    06/09/12 12:56 PM      Result Value Range Status   Specimen Description BLOOD RIGHT HAND   Final  Special Requests     Final   Value: BOTTLES DRAWN AEROBIC AND ANAEROBIC 2CC BOTH BOTTLES   Culture  Setup Time 06/09/2012 17:32   Final   Culture     Final   Value:        BLOOD CULTURE RECEIVED NO GROWTH TO DATE CULTURE WILL BE HELD FOR 5 DAYS BEFORE ISSUING A FINAL NEGATIVE REPORT   Report Status PENDING   Incomplete  CULTURE, ROUTINE-ABSCESS     Status: None   Collection Time    06/10/12  4:55 PM      Result Value Range Status   Specimen Description OTHER   Final   Special Requests NONE   Final   Gram Stain     Final   Value: ABUNDANT WBC PRESENT,BOTH PMN AND MONONUCLEAR     NO ORGANISMS SEEN   Culture PENDING   Incomplete   Report Status PENDING   Incomplete     Studies: Ct Abdomen Pelvis Wo Contrast  06/09/2012  *RADIOLOGY REPORT*  Clinical Data: Pyelonephritis with persistent fever, evaluate for abscess  CT ABDOMEN AND PELVIS WITHOUT CONTRAST  Technique:  Multidetector CT imaging of the abdomen and pelvis was performed following the standard protocol without intravenous contrast.  Comparison: Recent prior CT scan 06/05/2012  Findings:  Lower Chest:  Areas of subsegmental atelectasis versus chronic scarring noted in both lower lobes.  These regions are less conspicuous than seen on the recent prior study indicating improved inspiratory volumes.  Visualized cardiac structures within normal limits for size.  Trace pericardial fluid versus thickening anteriorly is similar to prior.  Unremarkable distal thoracic esophagus.  Abdomen: Significantly limited evaluation of the solid organs and vasculature in the absence of intravenous contrast material. Within these limitations, unremarkable noncontrasted CT appearance of the stomach and duodenum, spleen, adrenal glands and liver. Gallbladder is collapsed without evidence of cholelithiasis or intra or  extrahepatic biliary ductal dilatation.  The pancreas is within normal limits.  No significant interval change in the appearance of marked right-sided hydronephrosis there is mild perinephric stranding about the kidney and renal pelvis similar to the prior study.  The proximal ureter is also dilated. Stable 3 mm nonobstructing stone in the lower pole.  A rounds 5 mm calculus on image 55 of series 2 may be within the ureter or represent a phlebolith in the gonadal vein.  This is similar to prior studies and favored to represent a phlebolith.  The left kidney is unremarkable.  Extensive postsurgical changes throughout the abdomen with a prior colectomy, right lower quadrant ileostomy and hysterectomy.  Stable appearance of a 2.1 by 1.9 cm round low attenuation fluid collection associated with the rectal wall.  The bladder remains distended with urine.  No free fluid or adenopathy.  The Bones: No acute fracture or aggressive appearing lytic or blastic osseous lesion.  Similar appearance of levoconvex scoliosis centered at L2-L3 with extensive associated degenerative changes.  Vascular: Extremely limited evaluation in the absence of intravenous contrast material  IMPRESSION:  1.  No significant interval change in the appearance of marked right hydronephrosis and dilatation of the proximal right ureter with surrounding perinephric stranding compared to 06/05/2012. Evaluation for pyelonephritis is significantly limited in the absence of intravenous contrast material.  2.  Stable indeterminate 2.3 cm fluid collection in the low anatomic pelvis within the vaginal cuff versus rectal wall which remains incompletely evaluated but stable dating back to at least April 2013.  3.  Extensive intra-abdominal postoperative changes without evidence of  obstruction.   Original Report Authenticated By: Jacqulynn Cadet, M.D.    Ir Perc Nephrostomy Right  06/10/2012  *RADIOLOGY REPORT*  Clinical history:62 year-old with fevers and  chronic right hydronephrosis.  PROCEDURE(S): PERCUTANEOUS RIGHT NEPHROSTOMY TUBE WITH ULTRASOUND AND FLUOROSCOPIC GUIDANCE  Physician: Stephan Minister. Anselm Pancoast, MD  Medications:Ciprofloxacin 400 mg IV.  Versed 2 mg, Fentanyl 100 mcg.  A radiology nurse monitored the patient for moderate sedation.  Ciprofloxacin was given within two hours of incision.  Moderate sedation time:15 minutes  Fluoroscopy time: 0.7 minutes  Procedure:The procedure was explained to the patient.  The risks and benefits of the procedure were discussed and the patient's questions were addressed.  Informed consent was obtained from the patient.  The patient was placed prone.  Ultrasound demonstrated an enlarged right renal collecting system.  The right flank was prepped and draped in a sterile fashion.  Maximal barrier sterile technique was utilized including caps, mask, sterile gowns, sterile gloves, sterile drape, hand hygiene and skin antiseptic.  Skin and soft tissues were anesthetized with lidocaine.  The 21 gauge needle was directed into the dilated upper pole collecting system.  Urine was identified in the needle.  A 0.018 wire was advanced into the renal collecting system and an Accustick dilator set was placed. Thick cloudy yellow fluid was aspirated.  A J wire was advanced into the collecting system and the tract was dilated to 10-French. 10.2 [place] multipurpose drain was reconstituted in the renal collecting system.  280 ml of the thick yellow fluid was removed. Catheter sutured to the skin. Fluoroscopic and ultrasound images were taken and saved for documentation.  Findings:Massive right hydronephrosis.  The upper pole collecting system was targeted and used for access.  280 ml of thick yellow fluid was removed.  Sample was sent for culture.  Complications: None  Impression:Successful percutaneous right nephrostomy tube placement.  280 ml of thick yellow fluid was removed.  Findings are concerning for pyonephrosis.  Sample sent for gram stain  and culture.   Original Report Authenticated By: Markus Daft, M.D.    Ir US Guide Bx Asp/drain  06/10/2012  *RADIOLOGY REPORT*  Clinical history:63 year-old with fevers and chronic right hydronephrosis.  PROCEDURE(S): PERCUTANEOUS RIGHT NEPHROSTOMY TUBE WITH ULTRASOUND AND FLUOROSCOPIC GUIDANCE  Physician: Stephan Minister. Anselm Pancoast, MD  Medications:Ciprofloxacin 400 mg IV.  Versed 2 mg, Fentanyl 100 mcg.  A radiology nurse monitored the patient for moderate sedation.  Ciprofloxacin was given within two hours of incision.  Moderate sedation time:15 minutes  Fluoroscopy time: 0.7 minutes  Procedure:The procedure was explained to the patient.  The risks and benefits of the procedure were discussed and the patient's questions were addressed.  Informed consent was obtained from the patient.  The patient was placed prone.  Ultrasound demonstrated an enlarged right renal collecting system.  The right flank was prepped and draped in a sterile fashion.  Maximal barrier sterile technique was utilized including caps, mask, sterile gowns, sterile gloves, sterile drape, hand hygiene and skin antiseptic.  Skin and soft tissues were anesthetized with lidocaine.  The 21 gauge needle was directed into the dilated upper pole collecting system.  Urine was identified in the needle.  A 0.018 wire was advanced into the renal collecting system and an Accustick dilator set was placed. Thick cloudy yellow fluid was aspirated.  A J wire was advanced into the collecting system and the tract was dilated to 10-French. 10.2 [place] multipurpose drain was reconstituted in the renal collecting system.  280 ml of the  thick yellow fluid was removed. Catheter sutured to the skin. Fluoroscopic and ultrasound images were taken and saved for documentation.  Findings:Massive right hydronephrosis.  The upper pole collecting system was targeted and used for access.  280 ml of thick yellow fluid was removed.  Sample was sent for culture.  Complications: None   Impression:Successful percutaneous right nephrostomy tube placement.  280 ml of thick yellow fluid was removed.  Findings are concerning for pyonephrosis.  Sample sent for gram stain and culture.   Original Report Authenticated By: Markus Daft, M.D.     Scheduled Meds: . aspirin EC  81 mg Oral Daily  . Bismuth Subgallate  400 mg Oral QID  . calcium carbonate  1 tablet Oral BID WC  .  ceFAZolin (ANCEF) IV  2 g Intravenous Q8H  . enoxaparin (LOVENOX) injection  40 mg Subcutaneous Q24H  . ferrous sulfate  325 mg Oral Q breakfast  . multivitamin with minerals  1 tablet Oral Daily  . omega-3 acid ethyl esters  1 g Oral q morning - 10a  . potassium chloride  40 mEq Oral BID  . sodium chloride  3 mL Intravenous Q12H  . tamoxifen  20 mg Oral q morning - 10a  . vitamin C  1,000 mg Oral Daily  . vitamin E  400 Units Oral q morning - 10a   Continuous Infusions:     Time spent: 25 minutes    Elyas Villamor  [facility] Pager [phone] If 8PM-8AM, please contact night-coverage at [email or web address], password [facility] 06/11/2012, 2:41 PM  LOS: 6 days

## 2012-06-11 NOTE — Progress Notes (Signed)
Patient ID: Deanna Burke, female   DOB: April 07, 1951, 62 y.o.   MRN: [id number] 1 Day Post-Op Subjective: Patient reports feeling better this morning. Her percutaneous nephrostomy tube placement apparently went without difficulty and a fairly large amount of. The material was obtained. Again she does feel better this morning and her fever curve is markedly improved. Her nephrostomy tube is draining relatively normal-appearing urine that is blood tinged.  Objective: Vital signs in last 24 hours: Temp:  [98.9 F (37.2 C)-99.6 F (37.6 C)] 99.6 F (37.6 C) (02/18 0552) Pulse Rate:  [95-111] 95 (02/18 0552) Resp:  [20-26] 20 (02/18 0552) BP: (102-124)/(50-68) 102/50 mmHg (02/18 0552) SpO2:  [97 %-100 %] 97 % (02/18 0552)  Intake/Output from previous day: 02/17 0701 - 02/18 0700 In: 240 [P.O.:240] Out: 201 [Urine:201] Intake/Output this shift:    Physical Exam:  Constitutional: Vital signs reviewed. WD WN in NAD   Eyes: PERRL, No scleral icterus.   Cardiovascular: RRR Pulmonary/Chest: Normal effort Abdominal: Soft. Non-tender, non-distended Genitourinary: Not examined Extremities: No cyanosis or edema   Lab Results:  Recent Labs  06/10/12 1040  HGB 9.5*  HCT 27.3*   BMET No results found for this basename: NA, K, CL, CO2, GLUCOSE, BUN, CREATININE, CALCIUM,  in the last 72 hours No results found for this basename: LABPT, INR,  in the last 72 hours No results found for this basename: LABURIN,  in the last 72 hours Results for orders placed during the hospital encounter of 06/05/12  URINE CULTURE     Status: None   Collection Time    06/05/12  2:39 PM      Result Value Range Status   Specimen Description URINE, CLEAN CATCH   Final   Special Requests NONE   Final   Culture  Setup Time 06/06/2012 01:31   Final   Colony Count >=100,000 COLONIES/ML   Final   Culture PROTEUS MIRABILIS   Final   Report Status 06/07/2012 FINAL   Final   Organism ID, Bacteria PROTEUS  MIRABILIS   Final  CULTURE, BLOOD (ROUTINE X 2)     Status: None   Collection Time    06/05/12  6:09 PM      Result Value Range Status   Specimen Description BLOOD LEFT HAND   Final   Special Requests BOTTLES DRAWN AEROBIC AND ANAEROBIC 5ML   Final   Culture  Setup Time 06/05/2012 23:30   Final   Culture     Final   Value:        BLOOD CULTURE RECEIVED NO GROWTH TO DATE CULTURE WILL BE HELD FOR 5 DAYS BEFORE ISSUING A FINAL NEGATIVE REPORT   Report Status PENDING   Incomplete  CULTURE, BLOOD (ROUTINE X 2)     Status: None   Collection Time    06/05/12  6:15 PM      Result Value Range Status   Specimen Description BLOOD LEFT ASSIST CONTROL   Final   Special Requests BOTTLES DRAWN AEROBIC AND ANAEROBIC 3ML   Final   Culture  Setup Time 06/05/2012 23:30   Final   Culture     Final   Value:        BLOOD CULTURE RECEIVED NO GROWTH TO DATE CULTURE WILL BE HELD FOR 5 DAYS BEFORE ISSUING A FINAL NEGATIVE REPORT   Report Status PENDING   Incomplete  CULTURE, BLOOD (ROUTINE X 2)     Status: None   Collection Time    06/09/12 12:50 PM  Result Value Range Status   Specimen Description BLOOD RIGHT ARM   Final   Special Requests     Final   Value: BOTTLES DRAWN AEROBIC AND ANAEROBIC 5CC BOTH BOTTLES   Culture  Setup Time 06/09/2012 17:32   Final   Culture     Final   Value:        BLOOD CULTURE RECEIVED NO GROWTH TO DATE CULTURE WILL BE HELD FOR 5 DAYS BEFORE ISSUING A FINAL NEGATIVE REPORT   Report Status PENDING   Incomplete  CULTURE, BLOOD (ROUTINE X 2)     Status: None   Collection Time    06/09/12 12:56 PM      Result Value Range Status   Specimen Description BLOOD RIGHT HAND   Final   Special Requests     Final   Value: BOTTLES DRAWN AEROBIC AND ANAEROBIC 2CC BOTH BOTTLES   Culture  Setup Time 06/09/2012 17:32   Final   Culture     Final   Value:        BLOOD CULTURE RECEIVED NO GROWTH TO DATE CULTURE WILL BE HELD FOR 5 DAYS BEFORE ISSUING A FINAL NEGATIVE REPORT   Report Status  PENDING   Incomplete  CULTURE, ROUTINE-ABSCESS     Status: None   Collection Time    06/10/12  4:55 PM      Result Value Range Status   Specimen Description OTHER   Final   Special Requests NONE   Final   Gram Stain     Final   Value: ABUNDANT WBC PRESENT,BOTH PMN AND MONONUCLEAR     NO ORGANISMS SEEN   Culture PENDING   Incomplete   Report Status PENDING   Incomplete    Studies/Results: Ct Abdomen Pelvis Wo Contrast  06/09/2012  *RADIOLOGY REPORT*  Clinical Data: Pyelonephritis with persistent fever, evaluate for abscess  CT ABDOMEN AND PELVIS WITHOUT CONTRAST  Technique:  Multidetector CT imaging of the abdomen and pelvis was performed following the standard protocol without intravenous contrast.  Comparison: Recent prior CT scan 06/05/2012  Findings:  Lower Chest:  Areas of subsegmental atelectasis versus chronic scarring noted in both lower lobes.  These regions are less conspicuous than seen on the recent prior study indicating improved inspiratory volumes.  Visualized cardiac structures within normal limits for size.  Trace pericardial fluid versus thickening anteriorly is similar to prior.  Unremarkable distal thoracic esophagus.  Abdomen: Significantly limited evaluation of the solid organs and vasculature in the absence of intravenous contrast material. Within these limitations, unremarkable noncontrasted CT appearance of the stomach and duodenum, spleen, adrenal glands and liver. Gallbladder is collapsed without evidence of cholelithiasis or intra or extrahepatic biliary ductal dilatation.  The pancreas is within normal limits.  No significant interval change in the appearance of marked right-sided hydronephrosis there is mild perinephric stranding about the kidney and renal pelvis similar to the prior study.  The proximal ureter is also dilated. Stable 3 mm nonobstructing stone in the lower pole.  A rounds 5 mm calculus on image 55 of series 2 may be within the ureter or represent a  phlebolith in the gonadal vein.  This is similar to prior studies and favored to represent a phlebolith.  The left kidney is unremarkable.  Extensive postsurgical changes throughout the abdomen with a prior colectomy, right lower quadrant ileostomy and hysterectomy.  Stable appearance of a 2.1 by 1.9 cm round low attenuation fluid collection associated with the rectal wall.  The bladder remains distended with  urine.  No free fluid or adenopathy.  The Bones: No acute fracture or aggressive appearing lytic or blastic osseous lesion.  Similar appearance of levoconvex scoliosis centered at L2-L3 with extensive associated degenerative changes.  Vascular: Extremely limited evaluation in the absence of intravenous contrast material  IMPRESSION:  1.  No significant interval change in the appearance of marked right hydronephrosis and dilatation of the proximal right ureter with surrounding perinephric stranding compared to 06/05/2012. Evaluation for pyelonephritis is significantly limited in the absence of intravenous contrast material.  2.  Stable indeterminate 2.3 cm fluid collection in the low anatomic pelvis within the vaginal cuff versus rectal wall which remains incompletely evaluated but stable dating back to at least April 2013.  3.  Extensive intra-abdominal postoperative changes without evidence of obstruction.   Original Report Authenticated By: Jacqulynn Cadet, M.D.    Ir Perc Nephrostomy Right  06/10/2012  *RADIOLOGY REPORT*  Clinical history:62 year-old with fevers and chronic right hydronephrosis.  PROCEDURE(S): PERCUTANEOUS RIGHT NEPHROSTOMY TUBE WITH ULTRASOUND AND FLUOROSCOPIC GUIDANCE  Physician: Stephan Minister. Anselm Pancoast, MD  Medications:Ciprofloxacin 400 mg IV.  Versed 2 mg, Fentanyl 100 mcg.  A radiology nurse monitored the patient for moderate sedation.  Ciprofloxacin was given within two hours of incision.  Moderate sedation time:15 minutes  Fluoroscopy time: 0.7 minutes  Procedure:The procedure was explained  to the patient.  The risks and benefits of the procedure were discussed and the patient's questions were addressed.  Informed consent was obtained from the patient.  The patient was placed prone.  Ultrasound demonstrated an enlarged right renal collecting system.  The right flank was prepped and draped in a sterile fashion.  Maximal barrier sterile technique was utilized including caps, mask, sterile gowns, sterile gloves, sterile drape, hand hygiene and skin antiseptic.  Skin and soft tissues were anesthetized with lidocaine.  The 21 gauge needle was directed into the dilated upper pole collecting system.  Urine was identified in the needle.  A 0.018 wire was advanced into the renal collecting system and an Accustick dilator set was placed. Thick cloudy yellow fluid was aspirated.  A J wire was advanced into the collecting system and the tract was dilated to 10-French. 10.2 [place] multipurpose drain was reconstituted in the renal collecting system.  280 ml of the thick yellow fluid was removed. Catheter sutured to the skin. Fluoroscopic and ultrasound images were taken and saved for documentation.  Findings:Massive right hydronephrosis.  The upper pole collecting system was targeted and used for access.  280 ml of thick yellow fluid was removed.  Sample was sent for culture.  Complications: None  Impression:Successful percutaneous right nephrostomy tube placement.  280 ml of thick yellow fluid was removed.  Findings are concerning for pyonephrosis.  Sample sent for gram stain and culture.   Original Report Authenticated By: Markus Daft, M.D.    Ir US Guide Bx Asp/drain  06/10/2012  *RADIOLOGY REPORT*  Clinical history:62 year-old with fevers and chronic right hydronephrosis.  PROCEDURE(S): PERCUTANEOUS RIGHT NEPHROSTOMY TUBE WITH ULTRASOUND AND FLUOROSCOPIC GUIDANCE  Physician: Stephan Minister. Anselm Pancoast, MD  Medications:Ciprofloxacin 400 mg IV.  Versed 2 mg, Fentanyl 100 mcg.  A radiology nurse monitored the patient for  moderate sedation.  Ciprofloxacin was given within two hours of incision.  Moderate sedation time:15 minutes  Fluoroscopy time: 0.7 minutes  Procedure:The procedure was explained to the patient.  The risks and benefits of the procedure were discussed and the patient's questions were addressed.  Informed consent was obtained from the patient.  The patient was placed prone.  Ultrasound demonstrated an enlarged right renal collecting system.  The right flank was prepped and draped in a sterile fashion.  Maximal barrier sterile technique was utilized including caps, mask, sterile gowns, sterile gloves, sterile drape, hand hygiene and skin antiseptic.  Skin and soft tissues were anesthetized with lidocaine.  The 21 gauge needle was directed into the dilated upper pole collecting system.  Urine was identified in the needle.  A 0.018 wire was advanced into the renal collecting system and an Accustick dilator set was placed. Thick cloudy yellow fluid was aspirated.  A J wire was advanced into the collecting system and the tract was dilated to 10-French. 10.2 [place] multipurpose drain was reconstituted in the renal collecting system.  280 ml of the thick yellow fluid was removed. Catheter sutured to the skin. Fluoroscopic and ultrasound images were taken and saved for documentation.  Findings:Massive right hydronephrosis.  The upper pole collecting system was targeted and used for access.  280 ml of thick yellow fluid was removed.  Sample was sent for culture.  Complications: None  Impression:Successful percutaneous right nephrostomy tube placement.  280 ml of thick yellow fluid was removed.  Findings are concerning for pyonephrosis.  Sample sent for gram stain and culture.   Original Report Authenticated By: Markus Daft, M.D.     Assessment/Plan:   Clinical improvement status post drainage of hydronephrotic right kidney. Would continue current antibiotics and she is better clinically. Culture of the right kidney urine  should be available the next couple of days. If she continues to do well clinically hopefully interventional radiology to perform an antegrade nephrostogram study in the next couple of days followed by hopeful internalization of her nephrostomy tube to a double-J stent. Ultimately we'll need to assess the anatomy. 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 This will be done at a later date.   LOS: 6 days   Codee Tutson S 06/11/2012, 7:50 AM

## 2012-06-12 LAB — CREATININE, SERUM
Creatinine, Ser: 1.22 mg/dL — ABNORMAL HIGH (ref 0.50–1.10)
GFR calc Af Amer: 53 mL/min — ABNORMAL LOW (ref >90)

## 2012-06-12 MED ORDER — SODIUM CHLORIDE 0.9 % IV SOLN
INTRAVENOUS | Status: DC
  Administered 2012-06-12 – 2012-06-14 (×4): via INTRAVENOUS
  Administered 2012-06-14: 1000 mL via INTRAVENOUS

## 2012-06-12 MED ORDER — CIPROFLOXACIN IN D5W 400 MG/200ML IV SOLN
400.0000 mg | INTRAVENOUS | Status: AC
  Administered 2012-06-13: 400 mg via INTRAVENOUS
  Filled 2012-06-12: qty 200

## 2012-06-12 NOTE — Evaluation (Signed)
Occupational Therapy Evaluation Patient Details Name: Deanna Burke MRN: [id number] DOB: Apr 30, 1950 Today's Date: 06/12/2012 Time: 7253-6644 OT Time Calculation (min): 19 min  OT Assessment / Plan / Recommendation Clinical Impression  this 62 year old female was admitted for recurrent uti.  She is s/p nephrostomy tube.  She has pmh signficant of ileostomy and sbo's.  Will follow pt in acute but do not anticipate she'll need follow up OT    OT Assessment  Patient needs continued OT Services    Follow Up Recommendations  No OT follow up    Barriers to Discharge      Equipment Recommendations  None recommended by OT    Recommendations for Other Services    Frequency  Min 2X/week    Precautions / Restrictions Precautions Precautions: None Restrictions Weight Bearing Restrictions: No   Pertinent Vitals/Pain Sore from sx    ADL  Grooming: Set up Where Assessed - Grooming: Unsupported standing Upper Body Bathing: Set up Where Assessed - Upper Body Bathing: Unsupported sitting Lower Body Bathing: Set up Where Assessed - Lower Body Bathing: Unsupported sit to stand Upper Body Dressing: Minimal assistance (iv line) Where Assessed - Upper Body Dressing: Unsupported sitting Lower Body Dressing: Set up Where Assessed - Lower Body Dressing: Unsupported sit to stand Toilet Transfer: Supervision/safety Toilet Transfer Method: Sit to stand Toileting - Clothing Manipulation and Hygiene: Supervision/safety Equipment Used:  (iv pole) Transfers/Ambulation Related to ADLs: pt slow but no lob walking ADL Comments: pt lives alone, can still reach feet for adls but doesn't have normal energy:  she verbalizes understanding of energy conservation and will sponge bathe at home    OT Diagnosis: Generalized weakness  OT Problem List: Decreased strength;Decreased activity tolerance OT Treatment Interventions: Self-care/ADL training;DME and/or AE instruction;Patient/family education   OT  Goals Acute Rehab OT Goals OT Goal Formulation: With patient Time For Goal Achievement: 06/26/12 Potential to Achieve Goals: Good ADL Goals Pt Will Transfer to Toilet: with modified independence;Ambulation;3-in-1 ADL Goal: Toilet Transfer - Progress: Goal set today Miscellaneous OT Goals Miscellaneous OT Goal #1: pt will gather clothes and complete adl at modified independent level OT Goal: Miscellaneous Goal #1 - Progress: Goal set today  Visit Information  Last OT Received On: 06/12/12 Assistance Needed: +1    Subjective Data  Subjective: I want to go home.  I wanted to find out if someone could help me with meals, if not, I could probably have my friends help Patient Stated Goal: home and build up strength again.  Usually walks 2 miles day   Prior Functioning     Home Living Lives With: Alone Available Help at Discharge: Friend(s) (intermittent) Type of Home: House Home Access: Stairs to enter [employer] of Steps: 2 Entrance Stairs-Rails:  (back has pull bar; front nothing) Home Layout: One level Bathroom Shower/Tub: Chiropodist: Handicapped height Prior Function Level of Independence: Independent Communication Communication: No difficulties Dominant Hand: Right         Vision/Perception     Cognition  Cognition Overall Cognitive Status: Appears within functional limits for tasks assessed/performed Arousal/Alertness: Awake/alert Orientation Level: Oriented X4 / Intact Behavior During Session: WFL for tasks performed    Extremity/Trunk Assessment Right Upper Extremity Assessment RUE ROM/Strength/Tone: Within functional levels Left Upper Extremity Assessment LUE ROM/Strength/Tone: Deficits LUE ROM/Strength/Tone Deficits: s/p short for partial Rotator cuff tear     Mobility Transfers Transfers: Sit to Stand Sit to Stand: 6: Modified independent (Device/Increase time)     Exercise  Balance     End of Session OT -  End of Session Activity Tolerance: Patient tolerated treatment well Patient left: in bed;with call bell/phone within reach  Murray 06/12/2012, 3:28 PM Lesle Chris, OTR/L [phone] 06/12/2012

## 2012-06-12 NOTE — Progress Notes (Signed)
Patient ID: Deanna Burke, female   DOB: 10/14/1950, 62 y.o.   MRN: [id number] 2 Days Post-Op Subjective: Patient reports having a good night and has no new complaints today. She has continued to feel substantially better status post her percutaneous nephrostomy tube.  Objective: Vital signs in last 24 hours: Temp:  [98 F (36.7 C)-99.1 F (37.3 C)] 98.6 F (37 C) (02/19 0602) Pulse Rate:  [66-95] 66 (02/19 0602) Resp:  [18-20] 18 (02/19 0602) BP: (94-110)/(54-60) 94/56 mmHg (02/19 0602) SpO2:  [97 %-98 %] 97 % (02/19 0602)  Intake/Output from previous day: 02/18 0701 - 02/19 0700 In: 745 [P.O.:480; I.V.:90] Out: -  Intake/Output this shift: Total I/O In: 240 [P.O.:240] Out: -   Physical Exam:  Constitutional: Vital signs reviewed. WD WN in NAD   Eyes: PERRL, No scleral icterus.   Cardiovascular: RRR Pulmonary/Chest: Normal effort Abdominal: Soft. Non-tender, non-distended, bowel sounds are normal, no masses, organomegaly, or guarding present.  Genitourinary: Not examined Extremities: No cyanosis or edema   Lab Results:  Recent Labs  06/10/12 1040 06/11/12 0815  HGB 9.5* 8.8*  HCT 27.3* 26.6*   BMET  Recent Labs  06/11/12 0815 06/12/12 0340  NA 132*  --   K 4.1  --   CL 97  --   CO2 21  --   GLUCOSE 105*  --   BUN 16  --   CREATININE 1.32* 1.22*  CALCIUM 8.9  --    No results found for this basename: LABPT, INR,  in the last 72 hours No results found for this basename: LABURIN,  in the last 72 hours Results for orders placed during the hospital encounter of 06/05/12  URINE CULTURE     Status: None   Collection Time    06/05/12  2:39 PM      Result Value Range Status   Specimen Description URINE, CLEAN CATCH   Final   Special Requests NONE   Final   Culture  Setup Time 06/06/2012 01:31   Final   Colony Count >=100,000 COLONIES/ML   Final   Culture PROTEUS MIRABILIS   Final   Report Status 06/07/2012 FINAL   Final   Organism ID, Bacteria PROTEUS  MIRABILIS   Final  CULTURE, BLOOD (ROUTINE X 2)     Status: None   Collection Time    06/05/12  6:09 PM      Result Value Range Status   Specimen Description BLOOD LEFT HAND   Final   Special Requests BOTTLES DRAWN AEROBIC AND ANAEROBIC 5ML   Final   Culture  Setup Time 06/05/2012 23:30   Final   Culture NO GROWTH 5 DAYS   Final   Report Status 06/11/2012 FINAL   Final  CULTURE, BLOOD (ROUTINE X 2)     Status: None   Collection Time    06/05/12  6:15 PM      Result Value Range Status   Specimen Description BLOOD LEFT ASSIST CONTROL   Final   Special Requests BOTTLES DRAWN AEROBIC AND ANAEROBIC 3ML   Final   Culture  Setup Time 06/05/2012 23:30   Final   Culture NO GROWTH 5 DAYS   Final   Report Status 06/11/2012 FINAL   Final  CULTURE, BLOOD (ROUTINE X 2)     Status: None   Collection Time    06/09/12 12:50 PM      Result Value Range Status   Specimen Description BLOOD RIGHT ARM   Final   Special Requests  Final   Value: BOTTLES DRAWN AEROBIC AND ANAEROBIC 5CC BOTH BOTTLES   Culture  Setup Time 06/09/2012 17:32   Final   Culture     Final   Value:        BLOOD CULTURE RECEIVED NO GROWTH TO DATE CULTURE WILL BE HELD FOR 5 DAYS BEFORE ISSUING A FINAL NEGATIVE REPORT   Report Status PENDING   Incomplete  CULTURE, BLOOD (ROUTINE X 2)     Status: None   Collection Time    06/09/12 12:56 PM      Result Value Range Status   Specimen Description BLOOD RIGHT HAND   Final   Special Requests     Final   Value: BOTTLES DRAWN AEROBIC AND ANAEROBIC 2CC BOTH BOTTLES   Culture  Setup Time 06/09/2012 17:32   Final   Culture     Final   Value:        BLOOD CULTURE RECEIVED NO GROWTH TO DATE CULTURE WILL BE HELD FOR 5 DAYS BEFORE ISSUING A FINAL NEGATIVE REPORT   Report Status PENDING   Incomplete  CULTURE, ROUTINE-ABSCESS     Status: None   Collection Time    06/10/12  4:55 PM      Result Value Range Status   Specimen Description OTHER   Final   Special Requests NONE   Final   Gram  Stain     Final   Value: ABUNDANT WBC PRESENT,BOTH PMN AND MONONUCLEAR     NO ORGANISMS SEEN   Culture RARE PROTEUS MIRABILIS   Final   Report Status PENDING   Incomplete    Studies/Results: Ir Perc Nephrostomy Right  06/10/2012  *RADIOLOGY REPORT*  Clinical history:62 year-old with fevers and chronic right hydronephrosis.  PROCEDURE(S): PERCUTANEOUS RIGHT NEPHROSTOMY TUBE WITH ULTRASOUND AND FLUOROSCOPIC GUIDANCE  Physician: Stephan Minister. Anselm Pancoast, MD  Medications:Ciprofloxacin 400 mg IV.  Versed 2 mg, Fentanyl 100 mcg.  A radiology nurse monitored the patient for moderate sedation.  Ciprofloxacin was given within two hours of incision.  Moderate sedation time:15 minutes  Fluoroscopy time: 0.7 minutes  Procedure:The procedure was explained to the patient.  The risks and benefits of the procedure were discussed and the patient's questions were addressed.  Informed consent was obtained from the patient.  The patient was placed prone.  Ultrasound demonstrated an enlarged right renal collecting system.  The right flank was prepped and draped in a sterile fashion.  Maximal barrier sterile technique was utilized including caps, mask, sterile gowns, sterile gloves, sterile drape, hand hygiene and skin antiseptic.  Skin and soft tissues were anesthetized with lidocaine.  The 21 gauge needle was directed into the dilated upper pole collecting system.  Urine was identified in the needle.  A 0.018 wire was advanced into the renal collecting system and an Accustick dilator set was placed. Thick cloudy yellow fluid was aspirated.  A J wire was advanced into the collecting system and the tract was dilated to 10-French. 10.2 [place] multipurpose drain was reconstituted in the renal collecting system.  280 ml of the thick yellow fluid was removed. Catheter sutured to the skin. Fluoroscopic and ultrasound images were taken and saved for documentation.  Findings:Massive right hydronephrosis.  The upper pole collecting system was  targeted and used for access.  280 ml of thick yellow fluid was removed.  Sample was sent for culture.  Complications: None  Impression:Successful percutaneous right nephrostomy tube placement.  280 ml of thick yellow fluid was removed.  Findings are concerning for pyonephrosis.  Sample sent for gram stain and culture.   Original Report Authenticated By: Markus Daft, M.D.    Ir US Guide Bx Asp/drain  06/10/2012  *RADIOLOGY REPORT*  Clinical history:62 year-old with fevers and chronic right hydronephrosis.  PROCEDURE(S): PERCUTANEOUS RIGHT NEPHROSTOMY TUBE WITH ULTRASOUND AND FLUOROSCOPIC GUIDANCE  Physician: Stephan Minister. Anselm Pancoast, MD  Medications:Ciprofloxacin 400 mg IV.  Versed 2 mg, Fentanyl 100 mcg.  A radiology nurse monitored the patient for moderate sedation.  Ciprofloxacin was given within two hours of incision.  Moderate sedation time:15 minutes  Fluoroscopy time: 0.7 minutes  Procedure:The procedure was explained to the patient.  The risks and benefits of the procedure were discussed and the patient's questions were addressed.  Informed consent was obtained from the patient.  The patient was placed prone.  Ultrasound demonstrated an enlarged right renal collecting system.  The right flank was prepped and draped in a sterile fashion.  Maximal barrier sterile technique was utilized including caps, mask, sterile gowns, sterile gloves, sterile drape, hand hygiene and skin antiseptic.  Skin and soft tissues were anesthetized with lidocaine.  The 21 gauge needle was directed into the dilated upper pole collecting system.  Urine was identified in the needle.  A 0.018 wire was advanced into the renal collecting system and an Accustick dilator set was placed. Thick cloudy yellow fluid was aspirated.  A J wire was advanced into the collecting system and the tract was dilated to 10-French. 10.2 [place] multipurpose drain was reconstituted in the renal collecting system.  280 ml of the thick yellow fluid was removed. Catheter  sutured to the skin. Fluoroscopic and ultrasound images were taken and saved for documentation.  Findings:Massive right hydronephrosis.  The upper pole collecting system was targeted and used for access.  280 ml of thick yellow fluid was removed.  Sample was sent for culture.  Complications: None  Impression:Successful percutaneous right nephrostomy tube placement.  280 ml of thick yellow fluid was removed.  Findings are concerning for pyonephrosis.  Sample sent for gram stain and culture.   Original Report Authenticated By: Markus Daft, M.D.     Assessment/Plan:   Clinically much better status post percutaneous nephrostomy tube drainage. Final culture on that urine from the upper track remains pending. I discuss with interventional radiology going ahead with a antegrade study in the next day or so. We will then have them attempt to place an internal double-J stent with subsequent removal of the nephrostomy tube. Ms. Ringel will need a repeat renal scan once the kidney has had time for adequate drainage Ultimately to determine again if there is salvageable function. The decision will ultimately need to be made as to whether she should undergo a reconstructive procedure versus a nephrectomy.   LOS: 7 days   Jernard Reiber S 06/12/2012, 11:46 AM

## 2012-06-12 NOTE — Progress Notes (Signed)
[facility] PROGRESS NOTE  Deanna Burke HYQ:[id number] DOB: 1950/11/19 DOA: 06/05/2012 PCP: [name], MD  Brief narrative:  62 y.o. 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   Assessment/Plan:  Acute pyelonephritis  Patient initially started on IV vancomycin and gentamicin. Urine culture growing Proteus. -Patient has been febrile for past 4 days. Blood culture negative.  Repeated CT abd and shows persistent hydronephrosis with perinephric stranding.  - Ancef 2 g 8 times a day 2. reevaluated by urology and had decompression with a nephrostomy tube by IR on 2/17 -Urology recommend IR guided antegrade nephrostogram study in the next couple of days followed by hopeful internalization of her nephrostomy tube to a double-J stent.  -Probably stent tomorrow.  Will follow ID recommendation for antibiotic, length of therapy.   Hypokalemia  Resolved.   AKI  On presentation  likely prerenal and associated pyelonephritis  Hold triamterine-HCTZ . Renal function improved with hydration. Mild hyponatremia. Continue with IV fluids. B-met in am.   Hyponatremia  likey from dehydration and being on diuretics  Improved with hydration. Continue with IV fluids.   DVT prophylaxis: Lovenox.   Code Status: full  Family Communication: none at bedside  Disposition Plan: home once stable   Consultants:  Urology (Dr. Risa Grill) ID (Dr. Graylon Good) IR Procedures:  IR guided percutaneous nephrostomy on 2/17   HPI/Subjective: Patient feels better. Still with dysuria.  Tolerating diet.   Objective: Filed Vitals:   06/11/12 0552 06/11/12 1414 06/11/12 2230 06/12/12 0602  BP: 102/50 110/60 102/54 94/56  Pulse: 95 90 95 66  Temp: 99.6 F (37.6 C) 98 F (36.7 C) 99.1 F (37.3 C) 98.6  F (37 C)  TempSrc: Oral Oral Oral Oral  Resp: 20 20 18 18   Height:      Weight:      SpO2: 97% 97% 98% 97%    Intake/Output Summary (Last 24 hours) at 06/12/12 1231 Last data filed at 06/12/12 0946  Gross per 24 hour  Intake    645 ml  Output      0 ml  Net    645 ml   Filed Weights   06/05/12 1822  Weight: 74.39 kg (164 lb)    Exam:  General:  NAD  Cardiovascular: N S1& S2, no murmurs  Respiratory: clear b/l, no murmurs  Abdomen: soft, ND, BS+, nephrostomy tube over right draining.urine, 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  CNS: AAOX3    Data Reviewed: Basic Metabolic Panel:  Recent Labs Lab 06/05/12 1455 06/06/12 0456 06/07/12 0835 06/08/12 0539 06/11/12 0815 06/12/12 0340  NA 127* 130* 128* 132* 132*  --   K 2.9* 2.9* 3.1* 4.2 4.1  --   CL 86* 94* 93* 101 97  --   CO2 21 24 22 20 21   --   GLUCOSE 101* 110* 149* 106* 105*  --   BUN 41* 35* 23 22 16   --   CREATININE 1.42* 1.27* 1.05 1.03 1.32* 1.22*  CALCIUM 9.7 8.1* 8.7 8.8 8.9  --   MG  --  2.1  --   --   --   --    Liver Function Tests:  Recent Labs Lab 06/05/12 1455  AST 15  ALT 9  ALKPHOS 66  BILITOT 0.9  PROT 7.9  ALBUMIN 2.7*    Recent Labs  Lab 06/05/12 1455  LIPASE 33   No results found for this basename: AMMONIA,  in the last 168 hours CBC:  Recent Labs Lab 06/05/12 1455 06/06/12 0456 06/07/12 0835 06/10/12 1040 06/11/12 0815  WBC 6.5 5.5 5.9 7.4 8.5  NEUTROABS 5.6  --   --  6.5 7.5  HGB 12.2 10.4* 10.1* 9.5* 8.8*  HCT 34.8* 30.3* 29.6* 27.3* 26.6*  MCV 87.0 88.3 87.8 89.5 89.3  PLT 227 214 248 447* 451*   Cardiac Enzymes: No results found for this basename: CKTOTAL, CKMB, CKMBINDEX, TROPONINI,  in the last 168 hours BNP (last 3 results) No results found for this basename: PROBNP,  in the last 8760 hours CBG: No results found for this basename: GLUCAP,  in the last 168 hours  Recent Results (from the past 240 hour(s))  URINE CULTURE     Status: None   Collection Time     06/05/12  2:39 PM      Result Value Range Status   Specimen Description URINE, CLEAN CATCH   Final   Special Requests NONE   Final   Culture  Setup Time 06/06/2012 01:31   Final   Colony Count >=100,000 COLONIES/ML   Final   Culture PROTEUS MIRABILIS   Final   Report Status 06/07/2012 FINAL   Final   Organism ID, Bacteria PROTEUS MIRABILIS   Final  CULTURE, BLOOD (ROUTINE X 2)     Status: None   Collection Time    06/05/12  6:09 PM      Result Value Range Status   Specimen Description BLOOD LEFT HAND   Final   Special Requests BOTTLES DRAWN AEROBIC AND ANAEROBIC 5ML   Final   Culture  Setup Time 06/05/2012 23:30   Final   Culture NO GROWTH 5 DAYS   Final   Report Status 06/11/2012 FINAL   Final  CULTURE, BLOOD (ROUTINE X 2)     Status: None   Collection Time    06/05/12  6:15 PM      Result Value Range Status   Specimen Description BLOOD LEFT ASSIST CONTROL   Final   Special Requests BOTTLES DRAWN AEROBIC AND ANAEROBIC 3ML   Final   Culture  Setup Time 06/05/2012 23:30   Final   Culture NO GROWTH 5 DAYS   Final   Report Status 06/11/2012 FINAL   Final  CULTURE, BLOOD (ROUTINE X 2)     Status: None   Collection Time    06/09/12 12:50 PM      Result Value Range Status   Specimen Description BLOOD RIGHT ARM   Final   Special Requests     Final   Value: BOTTLES DRAWN AEROBIC AND ANAEROBIC 5CC BOTH BOTTLES   Culture  Setup Time 06/09/2012 17:32   Final   Culture     Final   Value:        BLOOD CULTURE RECEIVED NO GROWTH TO DATE CULTURE WILL BE HELD FOR 5 DAYS BEFORE ISSUING A FINAL NEGATIVE REPORT   Report Status PENDING   Incomplete  CULTURE, BLOOD (ROUTINE X 2)     Status: None   Collection Time    06/09/12 12:56 PM      Result Value Range Status   Specimen Description BLOOD RIGHT HAND   Final   Special Requests     Final   Value: BOTTLES DRAWN AEROBIC AND ANAEROBIC 2CC BOTH BOTTLES   Culture  Setup Time 06/09/2012 17:32   Final   Culture  Final   Value:        BLOOD  CULTURE RECEIVED NO GROWTH TO DATE CULTURE WILL BE HELD FOR 5 DAYS BEFORE ISSUING A FINAL NEGATIVE REPORT   Report Status PENDING   Incomplete  CULTURE, ROUTINE-ABSCESS     Status: None   Collection Time    06/10/12  4:55 PM      Result Value Range Status   Specimen Description OTHER   Final   Special Requests NONE   Final   Gram Stain     Final   Value: ABUNDANT WBC PRESENT,BOTH PMN AND MONONUCLEAR     NO ORGANISMS SEEN   Culture RARE PROTEUS MIRABILIS   Final   Report Status PENDING   Incomplete     Studies: Ir Perc Nephrostomy Right  06/10/2012  *RADIOLOGY REPORT*  Clinical history:62 year-old with fevers and chronic right hydronephrosis.  PROCEDURE(S): PERCUTANEOUS RIGHT NEPHROSTOMY TUBE WITH ULTRASOUND AND FLUOROSCOPIC GUIDANCE  Physician: Stephan Minister. Anselm Pancoast, MD  Medications:Ciprofloxacin 400 mg IV.  Versed 2 mg, Fentanyl 100 mcg.  A radiology nurse monitored the patient for moderate sedation.  Ciprofloxacin was given within two hours of incision.  Moderate sedation time:15 minutes  Fluoroscopy time: 0.7 minutes  Procedure:The procedure was explained to the patient.  The risks and benefits of the procedure were discussed and the patient's questions were addressed.  Informed consent was obtained from the patient.  The patient was placed prone.  Ultrasound demonstrated an enlarged right renal collecting system.  The right flank was prepped and draped in a sterile fashion.  Maximal barrier sterile technique was utilized including caps, mask, sterile gowns, sterile gloves, sterile drape, hand hygiene and skin antiseptic.  Skin and soft tissues were anesthetized with lidocaine.  The 21 gauge needle was directed into the dilated upper pole collecting system.  Urine was identified in the needle.  A 0.018 wire was advanced into the renal collecting system and an Accustick dilator set was placed. Thick cloudy yellow fluid was aspirated.  A J wire was advanced into the collecting system and the tract was  dilated to 10-French. 10.2 [place] multipurpose drain was reconstituted in the renal collecting system.  280 ml of the thick yellow fluid was removed. Catheter sutured to the skin. Fluoroscopic and ultrasound images were taken and saved for documentation.  Findings:Massive right hydronephrosis.  The upper pole collecting system was targeted and used for access.  280 ml of thick yellow fluid was removed.  Sample was sent for culture.  Complications: None  Impression:Successful percutaneous right nephrostomy tube placement.  280 ml of thick yellow fluid was removed.  Findings are concerning for pyonephrosis.  Sample sent for gram stain and culture.   Original Report Authenticated By: Markus Daft, M.D.    Ir US Guide Bx Asp/drain  06/10/2012  *RADIOLOGY REPORT*  Clinical history:62 year-old with fevers and chronic right hydronephrosis.  PROCEDURE(S): PERCUTANEOUS RIGHT NEPHROSTOMY TUBE WITH ULTRASOUND AND FLUOROSCOPIC GUIDANCE  Physician: Stephan Minister. Anselm Pancoast, MD  Medications:Ciprofloxacin 400 mg IV.  Versed 2 mg, Fentanyl 100 mcg.  A radiology nurse monitored the patient for moderate sedation.  Ciprofloxacin was given within two hours of incision.  Moderate sedation time:15 minutes  Fluoroscopy time: 0.7 minutes  Procedure:The procedure was explained to the patient.  The risks and benefits of the procedure were discussed and the patient's questions were addressed.  Informed consent was obtained from the patient.  The patient was placed prone.  Ultrasound demonstrated an enlarged right renal collecting system.  The  right flank was prepped and draped in a sterile fashion.  Maximal barrier sterile technique was utilized including caps, mask, sterile gowns, sterile gloves, sterile drape, hand hygiene and skin antiseptic.  Skin and soft tissues were anesthetized with lidocaine.  The 21 gauge needle was directed into the dilated upper pole collecting system.  Urine was identified in the needle.  A 0.018 wire was advanced into the  renal collecting system and an Accustick dilator set was placed. Thick cloudy yellow fluid was aspirated.  A J wire was advanced into the collecting system and the tract was dilated to 10-French. 10.2 [place] multipurpose drain was reconstituted in the renal collecting system.  280 ml of the thick yellow fluid was removed. Catheter sutured to the skin. Fluoroscopic and ultrasound images were taken and saved for documentation.  Findings:Massive right hydronephrosis.  The upper pole collecting system was targeted and used for access.  280 ml of thick yellow fluid was removed.  Sample was sent for culture.  Complications: None  Impression:Successful percutaneous right nephrostomy tube placement.  280 ml of thick yellow fluid was removed.  Findings are concerning for pyonephrosis.  Sample sent for gram stain and culture.   Original Report Authenticated By: Markus Daft, M.D.     Scheduled Meds: . aspirin EC  81 mg Oral Daily  . Bismuth Subgallate  400 mg Oral QID  . calcium carbonate  1 tablet Oral BID WC  .  ceFAZolin (ANCEF) IV  2 g Intravenous Q8H  . enoxaparin (LOVENOX) injection  40 mg Subcutaneous Q24H  . ferrous sulfate  325 mg Oral Q breakfast  . multivitamin with minerals  1 tablet Oral Daily  . omega-3 acid ethyl esters  1 g Oral q morning - 10a  . potassium chloride  40 mEq Oral BID  . sodium chloride  3 mL Intravenous Q12H  . tamoxifen  20 mg Oral q morning - 10a  . vitamin C  1,000 mg Oral Daily  . vitamin E  400 Units Oral q morning - 10a   Continuous Infusions:     Time spent: 25 minutes    REGALADO,BELKYS  [facility] Pager [phone] If 8PM-8AM, please contact night-coverage at [email or web address], password [facility] 06/12/2012, 12:31 PM  LOS: 7 days

## 2012-06-12 NOTE — Progress Notes (Signed)
2 Days Post-Op  Subjective: Pt doing well; no new c/o ; has mild rt flank discomfort  Objective: Vital signs in last 24 hours: Temp:  [98 F (36.7 C)-99.1 F (37.3 C)] 98.6 F (37 C) (02/19 0602) Pulse Rate:  [66-95] 66 (02/19 0602) Resp:  [18-20] 18 (02/19 0602) BP: (94-110)/(54-60) 94/56 mmHg (02/19 0602) SpO2:  [97 %-98 %] 97 % (02/19 0602) Last BM Date: 06/12/12  Intake/Output from previous day: 02/18 0701 - 02/19 0700 In: 745 [P.O.:480; I.V.:90] Out: -  Intake/Output this shift: Total I/O In: 240 [P.O.:240] Out: -   Rt PCN intact; output last recorded yesterday 200 cc's , about 100-150 cc's in bag now blood-tinged urine, rt renal/urine cx- proteus (prelim), cytology (urine) neg for malignancy;  blood cx neg; chest- CTA bilat; heart- RRR; abd- soft, +BS,NT, intact ileostomy; ext with no edema.  Lab Results:   Recent Labs  06/10/12 1040 06/11/12 0815  WBC 7.4 8.5  HGB 9.5* 8.8*  HCT 27.3* 26.6*  PLT 447* 451*   BMET  Recent Labs  06/11/12 0815 06/12/12 0340  NA 132*  --   K 4.1  --   CL 97  --   CO2 21  --   GLUCOSE 105*  --   BUN 16  --   CREATININE 1.32* 1.22*  CALCIUM 8.9  --    PT/INR No results found for this basename: LABPROT, INR,  in the last 72 hours ABG No results found for this basename: PHART, PCO2, PO2, HCO3,  in the last 72 hours  Studies/Results: Ir Perc Nephrostomy Right  06/10/2012  *RADIOLOGY REPORT*  Clinical history:62 year-old with fevers and chronic right hydronephrosis.  PROCEDURE(S): PERCUTANEOUS RIGHT NEPHROSTOMY TUBE WITH ULTRASOUND AND FLUOROSCOPIC GUIDANCE  Physician: Stephan Minister. Anselm Pancoast, MD  Medications:Ciprofloxacin 400 mg IV.  Versed 2 mg, Fentanyl 100 mcg.  A radiology nurse monitored the patient for moderate sedation.  Ciprofloxacin was given within two hours of incision.  Moderate sedation time:15 minutes  Fluoroscopy time: 0.7 minutes  Procedure:The procedure was explained to the patient.  The risks and benefits of the  procedure were discussed and the patient's questions were addressed.  Informed consent was obtained from the patient.  The patient was placed prone.  Ultrasound demonstrated an enlarged right renal collecting system.  The right flank was prepped and draped in a sterile fashion.  Maximal barrier sterile technique was utilized including caps, mask, sterile gowns, sterile gloves, sterile drape, hand hygiene and skin antiseptic.  Skin and soft tissues were anesthetized with lidocaine.  The 21 gauge needle was directed into the dilated upper pole collecting system.  Urine was identified in the needle.  A 0.018 wire was advanced into the renal collecting system and an Accustick dilator set was placed. Thick cloudy yellow fluid was aspirated.  A J wire was advanced into the collecting system and the tract was dilated to 10-French. 10.2 [place] multipurpose drain was reconstituted in the renal collecting system.  280 ml of the thick yellow fluid was removed. Catheter sutured to the skin. Fluoroscopic and ultrasound images were taken and saved for documentation.  Findings:Massive right hydronephrosis.  The upper pole collecting system was targeted and used for access.  280 ml of thick yellow fluid was removed.  Sample was sent for culture.  Complications: None  Impression:Successful percutaneous right nephrostomy tube placement.  280 ml of thick yellow fluid was removed.  Findings are concerning for pyonephrosis.  Sample sent for gram stain and culture.   Original  Report Authenticated By: Markus Daft, M.D.    Ir US Guide Bx Asp/drain  06/10/2012  *RADIOLOGY REPORT*  Clinical history:62 year-old with fevers and chronic right hydronephrosis.  PROCEDURE(S): PERCUTANEOUS RIGHT NEPHROSTOMY TUBE WITH ULTRASOUND AND FLUOROSCOPIC GUIDANCE  Physician: Stephan Minister. Anselm Pancoast, MD  Medications:Ciprofloxacin 400 mg IV.  Versed 2 mg, Fentanyl 100 mcg.  A radiology nurse monitored the patient for moderate sedation.  Ciprofloxacin was given within  two hours of incision.  Moderate sedation time:15 minutes  Fluoroscopy time: 0.7 minutes  Procedure:The procedure was explained to the patient.  The risks and benefits of the procedure were discussed and the patient's questions were addressed.  Informed consent was obtained from the patient.  The patient was placed prone.  Ultrasound demonstrated an enlarged right renal collecting system.  The right flank was prepped and draped in a sterile fashion.  Maximal barrier sterile technique was utilized including caps, mask, sterile gowns, sterile gloves, sterile drape, hand hygiene and skin antiseptic.  Skin and soft tissues were anesthetized with lidocaine.  The 21 gauge needle was directed into the dilated upper pole collecting system.  Urine was identified in the needle.  A 0.018 wire was advanced into the renal collecting system and an Accustick dilator set was placed. Thick cloudy yellow fluid was aspirated.  A J wire was advanced into the collecting system and the tract was dilated to 10-French. 10.2 [place] multipurpose drain was reconstituted in the renal collecting system.  280 ml of the thick yellow fluid was removed. Catheter sutured to the skin. Fluoroscopic and ultrasound images were taken and saved for documentation.  Findings:Massive right hydronephrosis.  The upper pole collecting system was targeted and used for access.  280 ml of thick yellow fluid was removed.  Sample was sent for culture.  Complications: None  Impression:Successful percutaneous right nephrostomy tube placement.  280 ml of thick yellow fluid was removed.  Findings are concerning for pyonephrosis.  Sample sent for gram stain and culture.   Original Report Authenticated By: Markus Daft, M.D.    Results for orders placed during the hospital encounter of 06/05/12  URINE CULTURE     Status: None   Collection Time    06/05/12  2:39 PM      Result Value Range Status   Specimen Description URINE, CLEAN CATCH   Final   Special Requests NONE    Final   Culture  Setup Time 06/06/2012 01:31   Final   Colony Count >=100,000 COLONIES/ML   Final   Culture PROTEUS MIRABILIS   Final   Report Status 06/07/2012 FINAL   Final   Organism ID, Bacteria PROTEUS MIRABILIS   Final  CULTURE, BLOOD (ROUTINE X 2)     Status: None   Collection Time    06/05/12  6:09 PM      Result Value Range Status   Specimen Description BLOOD LEFT HAND   Final   Special Requests BOTTLES DRAWN AEROBIC AND ANAEROBIC 5ML   Final   Culture  Setup Time 06/05/2012 23:30   Final   Culture NO GROWTH 5 DAYS   Final   Report Status 06/11/2012 FINAL   Final  CULTURE, BLOOD (ROUTINE X 2)     Status: None   Collection Time    06/05/12  6:15 PM      Result Value Range Status   Specimen Description BLOOD LEFT ASSIST CONTROL   Final   Special Requests BOTTLES DRAWN AEROBIC AND ANAEROBIC 3ML   Final   Culture  Setup Time 06/05/2012 23:30   Final   Culture NO GROWTH 5 DAYS   Final   Report Status 06/11/2012 FINAL   Final  CULTURE, BLOOD (ROUTINE X 2)     Status: None   Collection Time    06/09/12 12:50 PM      Result Value Range Status   Specimen Description BLOOD RIGHT ARM   Final   Special Requests     Final   Value: BOTTLES DRAWN AEROBIC AND ANAEROBIC 5CC BOTH BOTTLES   Culture  Setup Time 06/09/2012 17:32   Final   Culture     Final   Value:        BLOOD CULTURE RECEIVED NO GROWTH TO DATE CULTURE WILL BE HELD FOR 5 DAYS BEFORE ISSUING A FINAL NEGATIVE REPORT   Report Status PENDING   Incomplete  CULTURE, BLOOD (ROUTINE X 2)     Status: None   Collection Time    06/09/12 12:56 PM      Result Value Range Status   Specimen Description BLOOD RIGHT HAND   Final   Special Requests     Final   Value: BOTTLES DRAWN AEROBIC AND ANAEROBIC 2CC BOTH BOTTLES   Culture  Setup Time 06/09/2012 17:32   Final   Culture     Final   Value:        BLOOD CULTURE RECEIVED NO GROWTH TO DATE CULTURE WILL BE HELD FOR 5 DAYS BEFORE ISSUING A FINAL NEGATIVE REPORT   Report Status  PENDING   Incomplete  CULTURE, ROUTINE-ABSCESS     Status: None   Collection Time    06/10/12  4:55 PM      Result Value Range Status   Specimen Description OTHER   Final   Special Requests NONE   Final   Gram Stain     Final   Value: ABUNDANT WBC PRESENT,BOTH PMN AND MONONUCLEAR     NO ORGANISMS SEEN   Culture RARE PROTEUS MIRABILIS   Final   Report Status PENDING   Incomplete     Anti-infectives: Anti-infectives   Start     Dose/Rate Route Frequency Ordered Stop   06/11/12 1200  ceFAZolin (ANCEF) IVPB 2 g/50 mL premix     2 g 100 mL/hr over 30 Minutes Intravenous Every 8 hours 06/11/12 0729     06/10/12 1430  ciprofloxacin (CIPRO) IVPB 400 mg     400 mg 200 mL/hr over 60 Minutes Intravenous  Once 06/10/12 1413 06/10/12 1541   06/10/12 1200  ceFAZolin (ANCEF) IVPB 1 g/50 mL premix  Status:  Discontinued     1 g 100 mL/hr over 30 Minutes Intravenous Every 8 hours 06/10/12 1002 06/11/12 0729   06/08/12 1100  sulfamethoxazole-trimethoprim (BACTRIM DS) 800-160 MG per tablet 1 tablet  Status:  Discontinued     1 tablet Oral Every 12 hours 06/08/12 1033 06/10/12 1002   06/08/12 0800  ciprofloxacin (CIPRO) tablet 500 mg  Status:  Discontinued     500 mg Oral 2 times daily 06/08/12 0733 06/08/12 1033   06/06/12 0800  gentamicin (GARAMYCIN) IVPB 100 mg  Status:  Discontinued     100 mg 200 mL/hr over 30 Minutes Intravenous Every 12 hours 06/05/12 1902 06/08/12 0733   06/05/12 2000  vancomycin (VANCOCIN) IVPB 1000 mg/200 mL premix  Status:  Discontinued     1,000 mg 200 mL/hr over 60 Minutes Intravenous Every 24 hours 06/05/12 1859 06/08/12 0733   06/05/12 2000  gentamicin (GARAMYCIN) 130 mg in  dextrose 5 % 50 mL IVPB     130 mg 106.5 mL/hr over 30 Minutes Intravenous  Once 06/05/12 1900 06/05/12 2004      Assessment/Plan: s/p right PCN 2/17 secondary to chronic right hydronephrosis, pyelonephritis. Plan is for attempted placement of right ureteral stent on 2/20. Details/risks of  above d/w pt with her understanding and consent.   LOS: 7 days    Harry Shuck,D [facility] 06/12/2012

## 2012-06-12 NOTE — Care Management (Signed)
Cm spoke with patient concerning discharge planning. Pt states feeling weak. CM expressed pt 's statement to MD. Awaiting PT/OT eval. Cm provided pt with [facility] agency choice for [facility]. Pt states living alone, would like [facility] services upon discharge to assist with care. Awaitng further MD orders. No choice made at this time.   Venita Lick Jia Dottavio,RN,BSN [phone]

## 2012-06-12 NOTE — Evaluation (Signed)
Physical Therapy Evaluation Patient Details Name: Deanna Burke MRN: [id number] DOB: 24-May-1950 Today's Date: 06/12/2012 Time: 7829-5621 PT Time Calculation (min): 17 min  PT Assessment / Plan / Recommendation Clinical Impression  62 yo female admitted with fever and hydronephosis who is feeling better after stent placement. She is doing well with initial mobility and I anticipate whe will continue to improve as she increases activity as she feels better.  will follow pt while she is inpatient and recommend continued ambulation with nursing but do not feel she needs follow up PT or DME    PT Assessment  Patient needs continued PT services    Follow Up Recommendations  No PT follow up    Does the patient have the potential to tolerate intense rehabilitation      Barriers to Discharge        Equipment Recommendations  None recommended by PT    Recommendations for Other Services     Frequency Min 3X/week    Precautions / Restrictions Precautions Precautions: None Restrictions Weight Bearing Restrictions: No   Pertinent Vitals/Pain HR increased to 142 with activity, back to 96 with rest.        Mobility  Bed Mobility Details for Bed Mobility Assistance: pt up in chair Transfers Sit to Stand: 6: Modified independent (Device/Increase time) Ambulation/Gait Ambulation/Gait Assistance: 5: Supervision Ambulation Distance (Feet): 150 Feet Assistive device: None Ambulation/Gait Assistance Details: no hands on assist needed Gait velocity: decreased General Gait Details: pt with fatigue and needed standing rest break. O@ sat 99, HR 142,  After a few minutes standing against the wall, pt able to continue back to room. after few minutes rest, HR 96 Stairs: No Wheelchair Mobility Wheelchair Mobility: No    Exercises Other Exercises Other Exercises: began instruction in setting a schedule for short frequent bouts of activity throughout the day Other Exercises: glute  sets Other Exercises: standing posture extension   PT Diagnosis: Difficulty walking;Generalized weakness  PT Problem List: Decreased activity tolerance;Decreased strength PT Treatment Interventions: Gait training;Stair training;Therapeutic exercise   PT Goals Acute Rehab PT Goals PT Goal Formulation: With patient Time For Goal Achievement: 06/26/12 Potential to Achieve Goals: Good Pt will go Supine/Side to Sit: Independently PT Goal: Supine/Side to Sit - Progress: Goal set today Pt will go Sit to Supine/Side: Independently PT Goal: Sit to Supine/Side - Progress: Goal set today Pt will go Sit to Stand: Independently PT Goal: Sit to Stand - Progress: Goal set today Pt will go Stand to Sit: Independently PT Goal: Stand to Sit - Progress: Goal set today Pt will Ambulate: >150 feet;Independently PT Goal: Ambulate - Progress: Goal set today Pt will Go Up / Down Stairs: 1-2 stairs;with modified independence PT Goal: Up/Down Stairs - Progress: Goal set today Pt will Perform Home Exercise Program: Independently PT Goal: Perform Home Exercise Program - Progress: Goal set today  Visit Information  Last PT Received On: 06/12/12 Assistance Needed: +1    Subjective Data  Subjective: "I can't believe how weak I got" Patient Stated Goal: to return home   Prior [place] Lives With: Alone Available Help at Discharge: Friend(s) (intermittent) Type of Home: House Home Access: Stairs to enter [employer] of Steps: 2 Entrance Stairs-Rails:  (back has pull bar; front nothing) Home Layout: One level Bathroom Shower/Tub: Chiropodist: Handicapped height Prior Function Level of Independence: Independent Communication Communication: No difficulties Dominant Hand: Right    Cognition  Cognition Overall Cognitive Status: Appears within functional  limits for tasks assessed/performed Arousal/Alertness: Awake/alert Orientation Level: Oriented X4 /  Intact Behavior During Session: [facility] for tasks performed    Extremity/Trunk Assessment Right Upper Extremity Assessment RUE ROM/Strength/Tone: Within functional levels Left Upper Extremity Assessment LUE ROM/Strength/Tone: Deficits LUE ROM/Strength/Tone Deficits: s/p short for partial Rotator cuff tear Right Lower Extremity Assessment RLE ROM/Strength/Tone: WFL for tasks assessed Left Lower Extremity Assessment LLE ROM/Strength/Tone: WFL for tasks assessed Trunk Assessment Trunk Assessment: Other exceptions Trunk Exceptions: dressing from stent surgery on right low back   Balance Balance Balance Assessed: Yes Static Sitting Balance Static Sitting - Balance Support: No upper extremity supported;Feet supported Static Sitting - Level of Assistance: 7: Independent Static Standing Balance Static Standing - Balance Support: No upper extremity supported;During functional activity Static Standing - Level of Assistance: 7: Independent Tandem Stance - Right Leg: 0 (balance loss) [place] - Eyes Closed: 10  End of Session PT - End of Session Activity Tolerance: Patient limited by fatigue Patient left: in chair;with family/visitor present  GP    Helene Kelp K. [place], [place] 06/12/2012, 4:14 PM

## 2012-06-13 ENCOUNTER — Inpatient Hospital Stay (HOSPITAL_COMMUNITY): Payer: BC Managed Care – PPO

## 2012-06-13 DIAGNOSIS — N10 Acute tubulo-interstitial nephritis: Secondary | ICD-10-CM

## 2012-06-13 LAB — CBC
HCT: 26.2 % — ABNORMAL LOW (ref 36.0–46.0)
MCHC: 33.6 g/dL (ref 30.0–36.0)
RDW: 14.2 % (ref 11.5–15.5)
WBC: 7.4 10*3/uL (ref 4.0–10.5)

## 2012-06-13 LAB — BASIC METABOLIC PANEL
Chloride: 108 mEq/L (ref 96–112)
GFR calc Af Amer: 66 mL/min — ABNORMAL LOW (ref >90)
GFR calc non Af Amer: 57 mL/min — ABNORMAL LOW (ref >90)
Potassium: 4.4 mEq/L (ref 3.5–5.1)
Sodium: 137 mEq/L (ref 135–145)

## 2012-06-13 LAB — CULTURE, ROUTINE-ABSCESS

## 2012-06-13 LAB — PROTIME-INR
INR: 1.07 (ref 0.00–1.49)
Prothrombin Time: 13.8 seconds (ref 11.6–15.2)

## 2012-06-13 LAB — APTT: aPTT: 37 seconds (ref 24–37)

## 2012-06-13 MED ORDER — LIDOCAINE HCL 1 % IJ SOLN
INTRAMUSCULAR | Status: AC
  Filled 2012-06-13: qty 20

## 2012-06-13 MED ORDER — IOHEXOL 300 MG/ML  SOLN: 50.0000 mL | Freq: Once | INTRAMUSCULAR | Status: AC | PRN

## 2012-06-13 MED ORDER — MIDAZOLAM HCL 2 MG/2ML IJ SOLN
INTRAMUSCULAR | Status: AC | PRN
  Administered 2012-06-13: 0.5 mg via INTRAVENOUS
  Administered 2012-06-13: 1 mg via INTRAVENOUS
  Administered 2012-06-13: 0.5 mg via INTRAVENOUS

## 2012-06-13 MED ORDER — FENTANYL CITRATE 0.05 MG/ML IJ SOLN
INTRAMUSCULAR | Status: AC | PRN
  Administered 2012-06-13 (×2): 50 ug via INTRAVENOUS

## 2012-06-13 NOTE — Progress Notes (Signed)
Physical Therapy Treatment Patient Details Name: Deanna Burke MRN: [id number] DOB: 12-02-1950 Today's Date: 06/13/2012 Time: 7782-4235 PT Time Calculation (min): 12 min  PT Assessment / Plan / Recommendation Comments on Treatment Session  Pt improving in activity tolerance.  Ancticipate pt will continue to improve with ad lib ambulation and exercise and will not need follow up PT or DME    Follow Up Recommendations  No PT follow up     Does the patient have the potential to tolerate intense rehabilitation     Barriers to Discharge        Equipment Recommendations  None recommended by PT    Recommendations for Other Services    Frequency     Plan Discharge plan remains appropriate;Frequency remains appropriate    Precautions / Restrictions     Pertinent Vitals/Pain No c/o pain, but pt still moves slowly    Mobility  Bed Mobility Details for Bed Mobility Assistance: pt up in chair Transfers Sit to Stand: 6: Modified independent (Device/Increase time) Ambulation/Gait Ambulation/Gait Assistance: 5: Supervision Assistive device: None Ambulation/Gait Assistance Details: occasional cues for posture Gait velocity: decreased General Gait Details: Improved ambulation tolerance today, thougth pt still has decreased speed No LOB noted Stairs: No Wheelchair Mobility Wheelchair Mobility: No    Exercises Other Exercises Other Exercises: pt issued written home program for basic core and LE strengthening.  Pt able to stand for exercise instruction and demonstrate a few of the exercises. She acknowledged understanding of all. Hard copy placed in chart   PT Diagnosis:    PT Problem List:   PT Treatment Interventions:     PT Goals Acute Rehab PT Goals PT Goal Formulation: With patient Time For Goal Achievement: 06/26/12 Potential to Achieve Goals: Good Pt will go Supine/Side to Sit: Independently Pt will go Sit to Supine/Side: Independently Pt will go Sit to Stand:  Independently PT Goal: Sit to Stand - Progress: Met Pt will go Stand to Sit: Independently PT Goal: Stand to Sit - Progress: Met Pt will Ambulate: >150 feet;Independently PT Goal: Ambulate - Progress: Progressing toward goal Pt will Go Up / Down Stairs: 1-2 stairs;with modified independence Pt will Perform Home Exercise Program: Independently PT Goal: Perform Home Exercise Program - Progress: Met  Visit Information  Last PT Received On: 06/13/12 Assistance Needed: +1    Subjective Data  Subjective: I feel stronger since yesterday Patient Stated Goal: to go home tomorrow   Cognition  Cognition Overall Cognitive Status: Appears within functional limits for tasks assessed/performed Arousal/Alertness: Awake/alert Orientation Level: Oriented X4 / Intact Behavior During Session: [facility] for tasks performed    Balance     End of Session PT - End of Session Activity Tolerance: Patient limited by fatigue Patient left: in chair   GP   Helene Kelp K. [place], [place]  06/13/2012, 10:18 AM

## 2012-06-13 NOTE — Progress Notes (Signed)
[facility]    Date of Admission:  06/05/2012   Total days of antibiotics 9        Day 4 cefazolin           ID: Deanna Burke is a 62 y.o. female with hydronephrosis/pyelonephritis with proteus mirabilis had perc neph exchange today  Principal Problem:   Pyelonephritis, acute Active Problems:   History of recurrent UTIs   Hydronephrosis of right kidney   Hyponatremia   Hypokalemia   Acute kidney injury    Subjective: Afebrile, had perc neph tube exchanged  Medications:  . aspirin EC  81 mg Oral Daily  . Bismuth Subgallate  400 mg Oral QID  . calcium carbonate  1 tablet Oral BID WC  .  ceFAZolin (ANCEF) IV  2 g Intravenous Q8H  . enoxaparin (LOVENOX) injection  40 mg Subcutaneous Q24H  . ferrous sulfate  325 mg Oral Q breakfast  . lidocaine      . multivitamin with minerals  1 tablet Oral Daily  . omega-3 acid ethyl esters  1 g Oral q morning - 10a  . sodium chloride  3 mL Intravenous Q12H  . tamoxifen  20 mg Oral q morning - 10a  . vitamin C  1,000 mg Oral Daily  . vitamin E  400 Units Oral q morning - 10a    Objective: Vital signs in last 24 hours: Temp:  [98.3 F (36.8 C)-98.7 F (37.1 C)] 98.3 F (36.8 C) (02/20 1415) Pulse Rate:  [83-101] 88 (02/20 1415) Resp:  [18-23] 20 (02/20 1415) BP: (93-123)/(41-64) 110/50 mmHg (02/20 1415) SpO2:  [95 %-100 %] 100 % (02/20 1415)  General: Average weight caucasian female, no acute distress, lying on stretcher  Eyes: PERRL, anicteric, non-injected.  ENT: OP clear, non-erythematous without plaques or exudates. MMM.  Neck: Supple without TM or JVD.  Lymph: No cervical, supraclavicular, or submandibular LAD.  Cardiovascular: RRR without m/r/g. 2+ pulses  Respiratory: CTA bilaterally without increased WOB.  Abdomen: Hyperactive BS. Soft, TTP in the right upper quadrant, epigastrium, and right lower quadrant without rebound or guarding. Ileostomy in right abdomen with green liquid stool.    Skin: No rashes or focal lesions.  Musculoskeletal: Normal bulk and tone. No LE edema.  Psychiatric: A & O x 4. Appropriate affect.  Neurologic: CN 3-12 intact. 5/5 strength. Sensation intact.     Lab Results  Recent Labs  06/11/12 0815 06/12/12 0340 06/13/12 0401  WBC 8.5  --  7.4  HGB 8.8*  --  8.8*  HCT 26.6*  --  26.2*  NA 132*  --  137  K 4.1  --  4.4  CL 97  --  108  CO2 21  --  19  BUN 16  --  21  CREATININE 1.32* 1.22* 1.02    Microbiology: 2/16 blood cx x 2 NGTD  2/12 blood cx x 2 NGTD  2/12 urine cx proteus (R nitrofurantoin but pan sensitive otherwise)    Studies/Results: Ir Nephrostomy Tube Change  06/13/2012  *RADIOLOGY REPORT*  Clinical history:62 year old with right hydronephrosis and recent percutaneous nephrostomy tube placement. Anticipate placing an antegrade ureteral stent if possible.  PROCEDURE(S): RIGHT NEPHROSTOGRAM; EXCHANGE OF RIGHT NEPHROSTOMY TUBE WITH FLUOROSCOPY  Physician: Stephan Minister. Henn, MD  Medications:Versed 2 mg, Fentanyl 100 mcg. A radiology nurse monitored the patient for moderate sedation.  Moderate sedation time:20 minutes  Fluoroscopy time: 4 minutes  Procedure:The procedure was explained to the patient.  The  risks and benefits of the procedure were discussed and the patient's questions were addressed.  Informed consent was obtained from the patient.  The patient was placed prone and the right flank was prepped and draped in a sterile fashion.  Maximal barrier sterile technique was utilized including caps, mask, sterile gowns, sterile gloves, sterile drape, hand hygiene and skin antiseptic.  Contrast was injected through the nephrostomy tube.  The nephrostomy tube was cut and removed over a Bentson wire.  A 5-French Kumpe catheter was placed.  Additional contrast was injected in the renal pelvis. The right ureter was never identified.  Attempted to cannulate the right ureter with a Glidewire but this was unsuccessful.  As a result, a new 10.2  [place] multipurpose drain was reconstituted over a Bentson wire.  Catheter was sutured to the skin and attached to a gravity bag.  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 Unable to cannulate or visualize the right ureter.  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  Unable to cannulate the right ureter and unable to place an antegrade ureteral stent.  Successful exchange of the right nephrostomy tube.   Original Report Authenticated By: Markus Daft, M.D.    Ir Nephrostogram Right  06/13/2012  *RADIOLOGY REPORT*  Clinical history:62 year old with right hydronephrosis and recent percutaneous nephrostomy tube placement. Anticipate placing an antegrade ureteral stent if possible.  PROCEDURE(S): RIGHT NEPHROSTOGRAM; EXCHANGE OF RIGHT NEPHROSTOMY TUBE WITH FLUOROSCOPY  Physician: Stephan Minister. Henn, MD  Medications:Versed 2 mg, Fentanyl 100 mcg. A radiology nurse monitored the patient for moderate sedation.  Moderate sedation time:20 minutes  Fluoroscopy time: 4 minutes  Procedure:The procedure was explained to the patient.  The risks and benefits of the procedure were discussed and the patient's questions were addressed.  Informed consent was obtained from the patient.  The patient was placed prone and the right flank was prepped and draped in a sterile fashion.  Maximal barrier sterile technique was utilized including caps, mask, sterile gowns, sterile gloves, sterile drape, hand hygiene and skin antiseptic.  Contrast was injected through the nephrostomy tube.  The nephrostomy tube was cut and removed over a Bentson wire.  A 5-French Kumpe catheter was placed.  Additional contrast was injected in the renal pelvis. The right ureter was never identified.  Attempted to cannulate the right ureter with a Glidewire but this was unsuccessful.  As a result, a new 10.2 [place] multipurpose drain was reconstituted over a Bentson wire.  Catheter was sutured to the  skin and attached to a gravity bag.  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 Unable to cannulate or visualize the right ureter.  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  Unable to cannulate the right ureter and unable to place an antegrade ureteral stent.  Successful exchange of the right nephrostomy tube.   Original Report Authenticated By: Markus Daft, M.D.      Assessment/Plan:61 yo F with pyelonephritis with proteus mirabilis, no improved on antibiotics initiated on 2/12, fever curve appears to   - continue with cefazolin 2gm Iv Q8hr. Would treat for 14 days, can convert to bactrim as oral regimen   Deanna Burke, [facility] Cell: [phone] Pager: [phone]  06/13/2012, 4:23 PM

## 2012-06-13 NOTE — Progress Notes (Signed)
OT Cancellation Note  Patient Details Name: Deanna Burke MRN: [id number] DOB: 1950-08-30   Cancelled Treatment:    Reason Eval/Treat Not Completed: Patient at procedure or test/ unavailable  Nedim Oki 06/13/2012, 2:02 PM Lesle Chris, OTR/L [phone] 06/13/2012

## 2012-06-13 NOTE — Procedures (Signed)
Nephrostogram demonstrated a complete UPJ obstruction.  Unable to cannulate the right ureter.  Successful nephrostomy tube exchange.

## 2012-06-13 NOTE — Progress Notes (Signed)
[facility] PROGRESS NOTE  Deanna Burke TDD:[id number] DOB: [birth year] DOA: 06/05/2012 PCP: [name], MD  Brief narrative:  62 y.o. 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   Assessment/Plan:  Acute pyelonephritis  Patient initially started on IV vancomycin and gentamicin. Urine culture growing Proteus. -Patient has been febrile. Blood culture negative.  Repeated CT abd and shows persistent hydronephrosis with perinephric stranding.  - Ancef 2 g 8 times a day 3. reevaluated by urology and had decompression with a nephrostomy tube by IR on 2/17 -Urology recommend IR guided antegrade nephrostogram study in the next couple of days followed by hopeful internalization of her nephrostomy tube to a double-J stent.  -For stent today/ Will follow ID recommendation for antibiotic, length of therapy.   Hypokalemia  Resolved.   AKI  On presentation  likely prerenal and associated pyelonephritis  Hold triamterine-HCTZ . Renal function improved with hydration. Mild hyponatremia. Continue with IV fluids.   Hyponatremia  likey from dehydration and being on diuretics  Improved with hydration. Continue with IV fluids. Resolved.  DVT prophylaxis: Lovenox.   Code Status: full  Family Communication: none at bedside  Disposition Plan: home once stable   Consultants:  Urology (Dr. Risa Grill) ID (Dr. Graylon Good) IR Procedures:  IR guided percutaneous nephrostomy on 2/17   HPI/Subjective: Patient feels better. Waiting for procedure.  Tolerating diet.   Objective: Filed Vitals:   06/13/12 1341 06/13/12 1346 06/13/12 1351 06/13/12 1415  BP: 107/64 110/56 107/53 110/50  Pulse: 95 94 96 88  Temp:    98.3 F (36.8 C)  TempSrc:    Oral  Resp: 20 18 20 20   Height:       Weight:      SpO2: 100% 100% 100% 100%    Intake/Output Summary (Last 24 hours) at 06/13/12 1616 Last data filed at 06/13/12 6237  Gross per 24 hour  Intake   1722 ml  Output    225 ml  Net   1497 ml   Filed Weights   06/05/12 1822  Weight: 74.39 kg (164 lb)    Exam:  General:  NAD  Cardiovascular: N S1& S2, no murmurs  Respiratory: clear b/l, no murmurs  Abdomen: soft, ND, BS+, nephrostomy tube over right draining.urine, 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  CNS: AAOX3    Data Reviewed: Basic Metabolic Panel:  Recent Labs Lab 06/07/12 0835 06/08/12 0539 06/11/12 0815 06/12/12 0340 06/13/12 0401  NA 128* 132* 132*  --  137  K 3.1* 4.2 4.1  --  4.4  CL 93* 101 97  --  108  CO2 22 20 21   --  19  GLUCOSE 149* 106* 105*  --  101*  BUN 23 22 16   --  21  CREATININE 1.05 1.03 1.32* 1.22* 1.02  CALCIUM 8.7 8.8 8.9  --  8.2*   Liver Function Tests: No results found for this basename: AST, ALT, ALKPHOS, BILITOT, PROT, ALBUMIN,  in the last 168 hours No results found for this basename: LIPASE, AMYLASE,  in the last 168 hours No results found for this basename: AMMONIA,  in the last 168 hours CBC:  Recent Labs Lab 06/07/12 0835 06/10/12 1040 06/11/12 0815 06/13/12 0401  WBC 5.9 7.4 8.5 7.4  NEUTROABS  --  6.5 7.5  --   HGB 10.1* 9.5*  8.8* 8.8*  HCT 29.6* 27.3* 26.6* 26.2*  MCV 87.8 89.5 89.3 90.7  PLT 248 447* 451* 480*   Cardiac Enzymes: No results found for this basename: CKTOTAL, CKMB, CKMBINDEX, TROPONINI,  in the last 168 hours BNP (last 3 results) No results found for this basename: PROBNP,  in the last 8760 hours CBG: No results found for this basename: GLUCAP,  in the last 168 hours  Recent Results (from the past 240 hour(s))  URINE CULTURE     Status: None   Collection Time    06/05/12  2:39 PM      Result Value Range Status   Specimen Description URINE, CLEAN CATCH   Final   Special Requests NONE   Final   Culture  Setup Time 06/06/2012 01:31    Final   Colony Count >=100,000 COLONIES/ML   Final   Culture PROTEUS MIRABILIS   Final   Report Status 06/07/2012 FINAL   Final   Organism ID, Bacteria PROTEUS MIRABILIS   Final  CULTURE, BLOOD (ROUTINE X 2)     Status: None   Collection Time    06/05/12  6:09 PM      Result Value Range Status   Specimen Description BLOOD LEFT HAND   Final   Special Requests BOTTLES DRAWN AEROBIC AND ANAEROBIC 5ML   Final   Culture  Setup Time 06/05/2012 23:30   Final   Culture NO GROWTH 5 DAYS   Final   Report Status 06/11/2012 FINAL   Final  CULTURE, BLOOD (ROUTINE X 2)     Status: None   Collection Time    06/05/12  6:15 PM      Result Value Range Status   Specimen Description BLOOD LEFT ASSIST CONTROL   Final   Special Requests BOTTLES DRAWN AEROBIC AND ANAEROBIC 3ML   Final   Culture  Setup Time 06/05/2012 23:30   Final   Culture NO GROWTH 5 DAYS   Final   Report Status 06/11/2012 FINAL   Final  CULTURE, BLOOD (ROUTINE X 2)     Status: None   Collection Time    06/09/12 12:50 PM      Result Value Range Status   Specimen Description BLOOD RIGHT ARM   Final   Special Requests     Final   Value: BOTTLES DRAWN AEROBIC AND ANAEROBIC 5CC BOTH BOTTLES   Culture  Setup Time 06/09/2012 17:32   Final   Culture     Final   Value:        BLOOD CULTURE RECEIVED NO GROWTH TO DATE CULTURE WILL BE HELD FOR 5 DAYS BEFORE ISSUING A FINAL NEGATIVE REPORT   Report Status PENDING   Incomplete  CULTURE, BLOOD (ROUTINE X 2)     Status: None   Collection Time    06/09/12 12:56 PM      Result Value Range Status   Specimen Description BLOOD RIGHT HAND   Final   Special Requests     Final   Value: BOTTLES DRAWN AEROBIC AND ANAEROBIC 2CC BOTH BOTTLES   Culture  Setup Time 06/09/2012 17:32   Final   Culture     Final   Value:        BLOOD CULTURE RECEIVED NO GROWTH TO DATE CULTURE WILL BE HELD FOR 5 DAYS BEFORE ISSUING A FINAL NEGATIVE REPORT   Report Status PENDING   Incomplete  CULTURE, ROUTINE-ABSCESS      Status: None   Collection Time    06/10/12  4:55 PM      Result Value Range Status   Specimen Description OTHER   Final   Special Requests NONE   Final   Gram Stain     Final   Value: ABUNDANT WBC PRESENT,BOTH PMN AND MONONUCLEAR     NO ORGANISMS SEEN   Culture RARE PROTEUS MIRABILIS   Final   Report Status 06/13/2012 FINAL   Final   Organism ID, Bacteria PROTEUS MIRABILIS   Final     Studies: Ir Nephrostomy Tube Change  06/13/2012  *RADIOLOGY REPORT*  Clinical history:62 year old with right hydronephrosis and recent percutaneous nephrostomy tube placement. Anticipate placing an antegrade ureteral stent if possible.  PROCEDURE(S): RIGHT NEPHROSTOGRAM; EXCHANGE OF RIGHT NEPHROSTOMY TUBE WITH FLUOROSCOPY  Physician: Stephan Minister. Henn, MD  Medications:Versed 2 mg, Fentanyl 100 mcg. A radiology nurse monitored the patient for moderate sedation.  Moderate sedation time:20 minutes  Fluoroscopy time: 4 minutes  Procedure:The procedure was explained to the patient.  The risks and benefits of the procedure were discussed and the patient's questions were addressed.  Informed consent was obtained from the patient.  The patient was placed prone and the right flank was prepped and draped in a sterile fashion.  Maximal barrier sterile technique was utilized including caps, mask, sterile gowns, sterile gloves, sterile drape, hand hygiene and skin antiseptic.  Contrast was injected through the nephrostomy tube.  The nephrostomy tube was cut and removed over a Bentson wire.  A 5-French Kumpe catheter was placed.  Additional contrast was injected in the renal pelvis. The right ureter was never identified.  Attempted to cannulate the right ureter with a Glidewire but this was unsuccessful.  As a result, a new 10.2 [place] multipurpose drain was reconstituted over a Bentson wire.  Catheter was sutured to the skin and attached to a gravity bag.  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 Unable to  cannulate or visualize the right ureter.  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  Unable to cannulate the right ureter and unable to place an antegrade ureteral stent.  Successful exchange of the right nephrostomy tube.   Original Report Authenticated By: Markus Daft, M.D.    Ir Nephrostogram Right  06/13/2012  *RADIOLOGY REPORT*  Clinical history:62 year old with right hydronephrosis and recent percutaneous nephrostomy tube placement. Anticipate placing an antegrade ureteral stent if possible.  PROCEDURE(S): RIGHT NEPHROSTOGRAM; EXCHANGE OF RIGHT NEPHROSTOMY TUBE WITH FLUOROSCOPY  Physician: Stephan Minister. Henn, MD  Medications:Versed 2 mg, Fentanyl 100 mcg. A radiology nurse monitored the patient for moderate sedation.  Moderate sedation time:20 minutes  Fluoroscopy time: 4 minutes  Procedure:The procedure was explained to the patient.  The risks and benefits of the procedure were discussed and the patient's questions were addressed.  Informed consent was obtained from the patient.  The patient was placed prone and the right flank was prepped and draped in a sterile fashion.  Maximal barrier sterile technique was utilized including caps, mask, sterile gowns, sterile gloves, sterile drape, hand hygiene and skin antiseptic.  Contrast was injected through the nephrostomy tube.  The nephrostomy tube was cut and removed over a Bentson wire.  A 5-French Kumpe catheter was placed.  Additional contrast was injected in the renal pelvis. The right ureter was never identified.  Attempted to cannulate the right ureter with a Glidewire but this was unsuccessful.  As a result, a new 10.2 [place] multipurpose drain was reconstituted over a Bentson wire.  Catheter was sutured to the skin and attached to  a gravity bag.  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 Unable to cannulate or visualize the right ureter.  Complications: None  Impression:Complete  obstruction of the right ureteropelvic junction.  Unable to cannulate the right ureter and unable to place an antegrade ureteral stent.  Successful exchange of the right nephrostomy tube.   Original Report Authenticated By: Markus Daft, M.D.     Scheduled Meds: . aspirin EC  81 mg Oral Daily  . [place]ism[place] Subgallate  400 mg Oral QID  . calcium carbonate  1 tablet Oral BID WC  .  ceFAZolin (ANCEF) IV  2 g Intravenous Q8H  . enoxaparin (LOVENOX) injection  40 mg Subcutaneous Q24H  . ferrous sulfate  325 mg Oral Q breakfast  . lidocaine      . multivitamin with minerals  1 tablet Oral Daily  . omega-3 acid ethyl esters  1 g Oral q morning - 10a  . potassium chloride  40 mEq Oral BID  . sodium chloride  3 mL Intravenous Q12H  . tamoxifen  20 mg Oral q morning - 10a  . vitamin C  1,000 mg Oral Daily  . vitamin E  400 Units Oral q morning - 10a   Continuous Infusions: . sodium chloride 75 mL/hr at 06/13/12 0330      Time spent: 25 minutes    Zai Chmiel  [facility] Pager [phone] If 8PM-8AM, please contact night-coverage at [email or web address], password [facility] 06/13/2012, 4:16 PM  LOS: 8 days

## 2012-06-14 LAB — BASIC METABOLIC PANEL
CO2: 20 mEq/L (ref 19–32)
Calcium: 8.6 mg/dL (ref 8.4–10.5)
Creatinine, Ser: 1.04 mg/dL (ref 0.50–1.10)
GFR calc Af Amer: 65 mL/min — ABNORMAL LOW (ref >90)
GFR calc non Af Amer: 56 mL/min — ABNORMAL LOW (ref >90)
Sodium: 135 mEq/L (ref 135–145)

## 2012-06-14 LAB — URINALYSIS, ROUTINE W REFLEX MICROSCOPIC
Glucose, UA: NEGATIVE mg/dL
Ketones, ur: NEGATIVE mg/dL
Leukocytes, UA: NEGATIVE
Protein, ur: NEGATIVE mg/dL
Urobilinogen, UA: 0.2 mg/dL (ref 0.0–1.0)

## 2012-06-14 MED ORDER — SULFAMETHOXAZOLE-TMP DS 800-160 MG PO TABS
1.0000 | ORAL_TABLET | Freq: Two times a day (BID) | ORAL | Status: DC
  Administered 2012-06-14: 1 via ORAL
  Filled 2012-06-14 (×2): qty 1

## 2012-06-14 NOTE — Progress Notes (Signed)
[facility] PROGRESS NOTE  Deanna Burke ZRA:[id number] DOB: 01-Oct-1950 DOA: 06/05/2012 PCP: [name], MD  Brief narrative:  62 y.o. 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   Assessment/Plan:  Acute pyelonephritis  Patient initially started on IV vancomycin and gentamicin. Urine culture growing Proteus. -Patient has been febrile. Blood culture negative.  Repeated CT abd and shows persistent hydronephrosis with perinephric stranding.  - Ancef 2 g 8 times a day 4. reevaluated by urology and had decompression with a nephrostomy tube by IR on 2/17 -Urology recommend IR guided antegrade nephrostogram study in the next couple of days followed by hopeful internalization of her nephrostomy tube to a double-J stent.  -For stent today/ Will continue with IV antibiotics today, discharge tomorrow on Bactrim.   Hypokalemia  Resolved.   Acute on chronic renal failure stage III.  On presentation  likely prerenal and associated pyelonephritis  Hold triamterine-HCTZ . Renal function improved with hydration. Mild hyponatremia. Continue with IV fluids.  Stable.  Hyponatremia  likey from dehydration and being on diuretics  Improved with hydration. Resolved with IV fluids.   DVT prophylaxis: Lovenox.   Code Status: full  Family Communication: none at bedside  Disposition Plan: home once stable   Consultants:  Urology (Dr. Risa Grill) ID (Dr. Graylon Good) IR Procedures:  IR guided percutaneous nephrostomy on 2/17   HPI/Subjective: Patient was crying, feeling overwhelm with medical problems.  She will have some friends available, to help at home. She will need help with dressing changes.   Objective: Filed Vitals:   06/13/12 1351 06/13/12 1415 06/13/12 2204 06/14/12 0600   BP: 107/53 110/50 119/47 112/48  Pulse: 96 88  94  Temp:  98.3 F (36.8 C) 98.2 F (36.8 C) 98.1 F (36.7 C)  TempSrc:  Oral Oral Oral  Resp: 20 20 20 20   Height:      Weight:      SpO2: 100% 100% 100% 98%    Intake/Output Summary (Last 24 hours) at 06/14/12 1508 Last data filed at 06/13/12 2207  Gross per 24 hour  Intake    360 ml  Output      0 ml  Net    360 ml   Filed Weights   06/05/12 1822  Weight: 74.39 kg (164 lb)    Exam:  General:  NAD  Cardiovascular: N S1& S2, no murmurs  Respiratory: clear b/l, no murmurs  Abdomen: soft, ND, BS+, nephrostomy tube over right draining.urine, 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  CNS: AAOX3    Data Reviewed: Basic Metabolic Panel:  Recent Labs Lab 06/08/12 0539 06/11/12 0815 06/12/12 0340 06/13/12 0401 06/14/12 0355  NA 132* 132*  --  137 135  K 4.2 4.1  --  4.4 4.8  CL 101 97  --  108 106  CO2 20 21  --  19 20  GLUCOSE 106* 105*  --  101* 103*  BUN 22 16  --  21 16  CREATININE 1.03 1.32* 1.22* 1.02 1.04  CALCIUM 8.8 8.9  --  8.2* 8.6   CBC:  Recent Labs Lab 06/10/12 1040 06/11/12 0815 06/13/12 0401  WBC 7.4 8.5 7.4  NEUTROABS 6.5 7.5  --   HGB 9.5* 8.8* 8.8*  HCT 27.3* 26.6* 26.2*  MCV 89.5 89.3 90.7  PLT 447* 451* 480*   Cardiac  Enzymes: No results found for this basename: CKTOTAL, CKMB, CKMBINDEX, TROPONINI,  in the last 168 hours BNP (last 3 results) No results found for this basename: PROBNP,  in the last 8760 hours CBG: No results found for this basename: GLUCAP,  in the last 168 hours  Recent Results (from the past 240 hour(s))  URINE CULTURE     Status: None   Collection Time    06/05/12  2:39 PM      Result Value Range Status   Specimen Description URINE, CLEAN CATCH   Final   Special Requests NONE   Final   Culture  Setup Time 06/06/2012 01:31   Final   Colony Count >=100,000 COLONIES/ML   Final   Culture PROTEUS MIRABILIS   Final   Report Status 06/07/2012 FINAL   Final   Organism ID,  Bacteria PROTEUS MIRABILIS   Final  CULTURE, BLOOD (ROUTINE X 2)     Status: None   Collection Time    06/05/12  6:09 PM      Result Value Range Status   Specimen Description BLOOD LEFT HAND   Final   Special Requests BOTTLES DRAWN AEROBIC AND ANAEROBIC 5ML   Final   Culture  Setup Time 06/05/2012 23:30   Final   Culture NO GROWTH 5 DAYS   Final   Report Status 06/11/2012 FINAL   Final  CULTURE, BLOOD (ROUTINE X 2)     Status: None   Collection Time    06/05/12  6:15 PM      Result Value Range Status   Specimen Description BLOOD LEFT ASSIST CONTROL   Final   Special Requests BOTTLES DRAWN AEROBIC AND ANAEROBIC 3ML   Final   Culture  Setup Time 06/05/2012 23:30   Final   Culture NO GROWTH 5 DAYS   Final   Report Status 06/11/2012 FINAL   Final  CULTURE, BLOOD (ROUTINE X 2)     Status: None   Collection Time    06/09/12 12:50 PM      Result Value Range Status   Specimen Description BLOOD RIGHT ARM   Final   Special Requests     Final   Value: BOTTLES DRAWN AEROBIC AND ANAEROBIC 5CC BOTH BOTTLES   Culture  Setup Time 06/09/2012 17:32   Final   Culture     Final   Value:        BLOOD CULTURE RECEIVED NO GROWTH TO DATE CULTURE WILL BE HELD FOR 5 DAYS BEFORE ISSUING A FINAL NEGATIVE REPORT   Report Status PENDING   Incomplete  CULTURE, BLOOD (ROUTINE X 2)     Status: None   Collection Time    06/09/12 12:56 PM      Result Value Range Status   Specimen Description BLOOD RIGHT HAND   Final   Special Requests     Final   Value: BOTTLES DRAWN AEROBIC AND ANAEROBIC 2CC BOTH BOTTLES   Culture  Setup Time 06/09/2012 17:32   Final   Culture     Final   Value:        BLOOD CULTURE RECEIVED NO GROWTH TO DATE CULTURE WILL BE HELD FOR 5 DAYS BEFORE ISSUING A FINAL NEGATIVE REPORT   Report Status PENDING   Incomplete  CULTURE, ROUTINE-ABSCESS     Status: None   Collection Time    06/10/12  4:55 PM      Result Value Range Status   Specimen Description OTHER   Final   Special Requests NONE  Final   Gram Stain     Final   Value: ABUNDANT WBC PRESENT,BOTH PMN AND MONONUCLEAR     NO ORGANISMS SEEN   Culture RARE PROTEUS MIRABILIS   Final   Report Status 06/13/2012 FINAL   Final   Organism ID, Bacteria PROTEUS MIRABILIS   Final     Studies: Ir Nephrostomy Tube Change  06/13/2012  *RADIOLOGY REPORT*  Clinical history:62 year old with right hydronephrosis and recent percutaneous nephrostomy tube placement. Anticipate placing an antegrade ureteral stent if possible.  PROCEDURE(S): RIGHT NEPHROSTOGRAM; EXCHANGE OF RIGHT NEPHROSTOMY TUBE WITH FLUOROSCOPY  Physician: Stephan Minister. Henn, MD  Medications:Versed 2 mg, Fentanyl 100 mcg. A radiology nurse monitored the patient for moderate sedation.  Moderate sedation time:20 minutes  Fluoroscopy time: 4 minutes  Procedure:The procedure was explained to the patient.  The risks and benefits of the procedure were discussed and the patient's questions were addressed.  Informed consent was obtained from the patient.  The patient was placed prone and the right flank was prepped and draped in a sterile fashion.  Maximal barrier sterile technique was utilized including caps, mask, sterile gowns, sterile gloves, sterile drape, hand hygiene and skin antiseptic.  Contrast was injected through the nephrostomy tube.  The nephrostomy tube was cut and removed over a Bentson wire.  A 5-French Kumpe catheter was placed.  Additional contrast was injected in the renal pelvis. The right ureter was never identified.  Attempted to cannulate the right ureter with a Glidewire but this was unsuccessful.  As a result, a new 10.2 [place] multipurpose drain was reconstituted over a Bentson wire.  Catheter was sutured to the skin and attached to a gravity bag.  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 Unable to cannulate or visualize the right ureter.  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  Unable to cannulate the  right ureter and unable to place an antegrade ureteral stent.  Successful exchange of the right nephrostomy tube.   Original Report Authenticated By: Markus Daft, M.D.    Ir Nephrostogram Right  06/13/2012  *RADIOLOGY REPORT*  Clinical history:62 year old with right hydronephrosis and recent percutaneous nephrostomy tube placement. Anticipate placing an antegrade ureteral stent if possible.  PROCEDURE(S): RIGHT NEPHROSTOGRAM; EXCHANGE OF RIGHT NEPHROSTOMY TUBE WITH FLUOROSCOPY  Physician: Stephan Minister. Henn, MD  Medications:Versed 2 mg, Fentanyl 100 mcg. A radiology nurse monitored the patient for moderate sedation.  Moderate sedation time:20 minutes  Fluoroscopy time: 4 minutes  Procedure:The procedure was explained to the patient.  The risks and benefits of the procedure were discussed and the patient's questions were addressed.  Informed consent was obtained from the patient.  The patient was placed prone and the right flank was prepped and draped in a sterile fashion.  Maximal barrier sterile technique was utilized including caps, mask, sterile gowns, sterile gloves, sterile drape, hand hygiene and skin antiseptic.  Contrast was injected through the nephrostomy tube.  The nephrostomy tube was cut and removed over a Bentson wire.  A 5-French Kumpe catheter was placed.  Additional contrast was injected in the renal pelvis. The right ureter was never identified.  Attempted to cannulate the right ureter with a Glidewire but this was unsuccessful.  As a result, a new 10.2 [place] multipurpose drain was reconstituted over a Bentson wire.  Catheter was sutured to the skin and attached to a gravity bag.  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 Unable to cannulate or visualize the right ureter.  Complications: None  Impression:Complete obstruction of  the right ureteropelvic junction.  Unable to cannulate the right ureter and unable to place an antegrade ureteral stent.  Successful exchange of the  right nephrostomy tube.   Original Report Authenticated By: Markus Daft, M.D.     Scheduled Meds: . aspirin EC  81 mg Oral Daily  . Bismuth Subgallate  400 mg Oral QID  . calcium carbonate  1 tablet Oral BID WC  .  ceFAZolin (ANCEF) IV  2 g Intravenous Q8H  . enoxaparin (LOVENOX) injection  40 mg Subcutaneous Q24H  . ferrous sulfate  325 mg Oral Q breakfast  . multivitamin with minerals  1 tablet Oral Daily  . omega-3 acid ethyl esters  1 g Oral q morning - 10a  . sodium chloride  3 mL Intravenous Q12H  . tamoxifen  20 mg Oral q morning - 10a  . vitamin C  1,000 mg Oral Daily  . vitamin E  400 Units Oral q morning - 10a   Continuous Infusions: . sodium chloride 75 mL/hr at 06/14/12 2951      Time spent: 25 minutes    Deanna Burke  [facility] Pager [phone] If 8PM-8AM, please contact night-coverage at [email or web address], password [facility] 06/14/2012, 3:08 PM  LOS: 9 days

## 2012-06-14 NOTE — Progress Notes (Signed)
Subjective: Pt feeling ok. A little sore at PCN site. A little depressed we were not able to place stent  Objective: Physical Exam: BP 112/48  Pulse 94  Temp(Src) 98.1 F (36.7 C) (Oral)  Resp 20  Ht 5' 4"  (1.626 m)  Wt 164 lb (74.39 kg)  BMI 28.14 kg/m2  SpO2 98% (R)PCn intact, site clean, UOP now more clear, scant purulence    Labs: CBC  Recent Labs  06/13/12 0401  WBC 7.4  HGB 8.8*  HCT 26.2*  PLT 480*   BMET  Recent Labs  06/13/12 0401 06/14/12 0355  NA 137 135  K 4.4 4.8  CL 108 106  CO2 19 20  GLUCOSE 101* 103*  BUN 21 16  CREATININE 1.02 1.04  CALCIUM 8.2* 8.6   LFT No results found for this basename: PROT, ALBUMIN, AST, ALT, ALKPHOS, BILITOT, BILIDIR, IBILI, LIPASE,  in the last 72 hours PT/INR  Recent Labs  06/13/12 0401  LABPROT 13.8  INR 1.07     Studies/Results: Ir Nephrostomy Tube Change  06/13/2012  *RADIOLOGY REPORT*  Clinical history:62 year old with right hydronephrosis and recent percutaneous nephrostomy tube placement. Anticipate placing an antegrade ureteral stent if possible.  PROCEDURE(S): RIGHT NEPHROSTOGRAM; EXCHANGE OF RIGHT NEPHROSTOMY TUBE WITH FLUOROSCOPY  Physician: Stephan Minister. Henn, MD  Medications:Versed 2 mg, Fentanyl 100 mcg. A radiology nurse monitored the patient for moderate sedation.  Moderate sedation time:20 minutes  Fluoroscopy time: 4 minutes  Procedure:The procedure was explained to the patient.  The risks and benefits of the procedure were discussed and the patient's questions were addressed.  Informed consent was obtained from the patient.  The patient was placed prone and the right flank was prepped and draped in a sterile fashion.  Maximal barrier sterile technique was utilized including caps, mask, sterile gowns, sterile gloves, sterile drape, hand hygiene and skin antiseptic.  Contrast was injected through the nephrostomy tube.  The nephrostomy tube was cut and removed over a Bentson wire.  A 5-French Kumpe  catheter was placed.  Additional contrast was injected in the renal pelvis. The right ureter was never identified.  Attempted to cannulate the right ureter with a Glidewire but this was unsuccessful.  As a result, a new 10.2 [place] multipurpose drain was reconstituted over a Bentson wire.  Catheter was sutured to the skin and attached to a gravity bag.  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 Unable to cannulate or visualize the right ureter.  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  Unable to cannulate the right ureter and unable to place an antegrade ureteral stent.  Successful exchange of the right nephrostomy tube.   Original Report Authenticated By: Markus Daft, M.D.    Ir Nephrostogram Right  06/13/2012  *RADIOLOGY REPORT*  Clinical history:62 year old with right hydronephrosis and recent percutaneous nephrostomy tube placement. Anticipate placing an antegrade ureteral stent if possible.  PROCEDURE(S): RIGHT NEPHROSTOGRAM; EXCHANGE OF RIGHT NEPHROSTOMY TUBE WITH FLUOROSCOPY  Physician: Stephan Minister. Henn, MD  Medications:Versed 2 mg, Fentanyl 100 mcg. A radiology nurse monitored the patient for moderate sedation.  Moderate sedation time:20 minutes  Fluoroscopy time: 4 minutes  Procedure:The procedure was explained to the patient.  The risks and benefits of the procedure were discussed and the patient's questions were addressed.  Informed consent was obtained from the patient.  The patient was placed prone and the right flank was prepped and draped in a sterile fashion.  Maximal barrier sterile technique was utilized including caps, mask, sterile gowns,  sterile gloves, sterile drape, hand hygiene and skin antiseptic.  Contrast was injected through the nephrostomy tube.  The nephrostomy tube was cut and removed over a Bentson wire.  A 5-French Kumpe catheter was placed.  Additional contrast was injected in the renal pelvis. The right ureter  was never identified.  Attempted to cannulate the right ureter with a Glidewire but this was unsuccessful.  As a result, a new 10.2 [place] multipurpose drain was reconstituted over a Bentson wire.  Catheter was sutured to the skin and attached to a gravity bag.  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 Unable to cannulate or visualize the right ureter.  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  Unable to cannulate the right ureter and unable to place an antegrade ureteral stent.  Successful exchange of the right nephrostomy tube.   Original Report Authenticated By: Markus Daft, M.D.     Assessment/Plan: S/p (R)PCN 2/17 secondary to chronic right hydronephrosis, pyelonephritis Nephrostogram demonstrated a complete UPJ obstruction. Unable to cannulate the right ureter. Successful nephrostomy tube exchange 2/20 Will cont to follow while inpt     LOS: 9 days    Ascencion Dike PA-C 06/14/2012 9:07 AM

## 2012-06-14 NOTE — Progress Notes (Signed)
OT Cancellation Note and Discharge Note:  Patient Details Name: Deanna Burke MRN: [id number] DOB: 1950-10-11   Cancelled Treatment:    Reason Eval/Treat Not Completed: Other (comment)  Pt reports she is slowly regaining endurance and has been able to perform own adls.  She doesn't believe she'll have difficulty gathering clothes.  Pt verbalizes understanding of energy conservation.  Signing off from acute OT . Lakenzie Mcclafferty 06/14/2012, 9:41 AM Lesle Chris, OTR/L [phone] 06/14/2012

## 2012-06-14 NOTE — Progress Notes (Signed)
[facility]    Date of Admission:  06/05/2012   Total days of antibiotics 9        Day 4 cefazolin           ID: Deanna Burke is a 62 y.o. female with hydronephrosis/pyelonephritis with proteus mirabilis had perc neph exchange today  Principal Problem:   Pyelonephritis, acute Active Problems:   History of recurrent UTIs   Hydronephrosis of right kidney   Hyponatremia   Hypokalemia   Acute kidney injury    Subjective: Afebrile, had perc neph tube exchanged  Medications:  . aspirin EC  81 mg Oral Daily  . Bismuth Subgallate  400 mg Oral QID  . calcium carbonate  1 tablet Oral BID WC  .  ceFAZolin (ANCEF) IV  2 g Intravenous Q8H  . enoxaparin (LOVENOX) injection  40 mg Subcutaneous Q24H  . ferrous sulfate  325 mg Oral Q breakfast  . multivitamin with minerals  1 tablet Oral Daily  . omega-3 acid ethyl esters  1 g Oral q morning - 10a  . sodium chloride  3 mL Intravenous Q12H  . tamoxifen  20 mg Oral q morning - 10a  . vitamin C  1,000 mg Oral Daily  . vitamin E  400 Units Oral q morning - 10a    Objective: Vital signs in last 24 hours: Temp:  [97.6 F (36.4 C)-98.2 F (36.8 C)] 97.6 F (36.4 C) (02/21 1422) Pulse Rate:  [94-96] 96 (02/21 1422) Resp:  [20] 20 (02/21 1422) BP: (112-119)/(47-55) 119/55 mmHg (02/21 1422) SpO2:  [98 %-100 %] 99 % (02/21 1422)  General: Average weight caucasian female, no acute distress, lying on stretcher  Eyes: PERRL, anicteric, non-injected.  ENT: OP clear, non-erythematous without plaques or exudates. MMM.  Neck: Supple without TM or JVD.  Lymph: No cervical, supraclavicular, or submandibular LAD.  Cardiovascular: RRR without m/r/g. 2+ pulses  Respiratory: CTA bilaterally without increased WOB.  Abdomen: Hyperactive BS. Soft, TTP in the right upper quadrant, epigastrium, and right lower quadrant without rebound or guarding. Ileostomy in right abdomen with green liquid stool.  Skin: No rashes or  focal lesions.  Musculoskeletal: Normal bulk and tone. No LE edema.  Psychiatric: A & O x 4. Appropriate affect.  Neurologic: CN 3-12 intact. 5/5 strength. Sensation intact.     Lab Results  Recent Labs  06/13/12 0401 06/14/12 0355  WBC 7.4  --   HGB 8.8*  --   HCT 26.2*  --   NA 137 135  K 4.4 4.8  CL 108 106  CO2 19 20  BUN 21 16  CREATININE 1.02 1.04    Microbiology: 2/16 blood cx x 2 NGTD  2/12 blood cx x 2 NGTD  2/12 urine cx proteus (R nitrofurantoin but pan sensitive otherwise)    Studies/Results: Ir Nephrostomy Tube Change  06/13/2012  *RADIOLOGY REPORT*  Clinical history:62 year old with right hydronephrosis and recent percutaneous nephrostomy tube placement. Anticipate placing an antegrade ureteral stent if possible.  PROCEDURE(S): RIGHT NEPHROSTOGRAM; EXCHANGE OF RIGHT NEPHROSTOMY TUBE WITH FLUOROSCOPY  Physician: Stephan Minister. Henn, MD  Medications:Versed 2 mg, Fentanyl 100 mcg. A radiology nurse monitored the patient for moderate sedation.  Moderate sedation time:20 minutes  Fluoroscopy time: 4 minutes  Procedure:The procedure was explained to the patient.  The risks and benefits of the procedure were discussed and the patient's questions were addressed.  Informed consent was obtained from the patient.  The patient was placed prone and  the right flank was prepped and draped in a sterile fashion.  Maximal barrier sterile technique was utilized including caps, mask, sterile gowns, sterile gloves, sterile drape, hand hygiene and skin antiseptic.  Contrast was injected through the nephrostomy tube.  The nephrostomy tube was cut and removed over a Bentson wire.  A 5-French Kumpe catheter was placed.  Additional contrast was injected in the renal pelvis. The right ureter was never identified.  Attempted to cannulate the right ureter with a Glidewire but this was unsuccessful.  As a result, a new 10.2 [place] multipurpose drain was reconstituted over a Bentson wire.  Catheter was  sutured to the skin and attached to a gravity bag.  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 Unable to cannulate or visualize the right ureter.  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  Unable to cannulate the right ureter and unable to place an antegrade ureteral stent.  Successful exchange of the right nephrostomy tube.   Original Report Authenticated By: Markus Daft, M.D.    Ir Nephrostogram Right  06/13/2012  *RADIOLOGY REPORT*  Clinical history:62 year old with right hydronephrosis and recent percutaneous nephrostomy tube placement. Anticipate placing an antegrade ureteral stent if possible.  PROCEDURE(S): RIGHT NEPHROSTOGRAM; EXCHANGE OF RIGHT NEPHROSTOMY TUBE WITH FLUOROSCOPY  Physician: Stephan Minister. Henn, MD  Medications:Versed 2 mg, Fentanyl 100 mcg. A radiology nurse monitored the patient for moderate sedation.  Moderate sedation time:20 minutes  Fluoroscopy time: 4 minutes  Procedure:The procedure was explained to the patient.  The risks and benefits of the procedure were discussed and the patient's questions were addressed.  Informed consent was obtained from the patient.  The patient was placed prone and the right flank was prepped and draped in a sterile fashion.  Maximal barrier sterile technique was utilized including caps, mask, sterile gowns, sterile gloves, sterile drape, hand hygiene and skin antiseptic.  Contrast was injected through the nephrostomy tube.  The nephrostomy tube was cut and removed over a Bentson wire.  A 5-French Kumpe catheter was placed.  Additional contrast was injected in the renal pelvis. The right ureter was never identified.  Attempted to cannulate the right ureter with a Glidewire but this was unsuccessful.  As a result, a new 10.2 [place] multipurpose drain was reconstituted over a Bentson wire.  Catheter was sutured to the skin and attached to a gravity bag.  Findings:Complete obstruction at the  right ureteropelvic junction. Unable to cannulate or visualize the right ureter.  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  Unable to cannulate the right ureter and unable to place an antegrade ureteral stent.  Successful exchange of the right nephrostomy tube.   Original Report Authenticated By: Markus Daft, M.D.      Assessment/Plan:61 yo F with pyelonephritis with proteus mirabilis pyelonephritis due to hydronephrosis s/p perc neph which decompressed kidney.  - agree with dr. Risa Grill, can switch to oral bactrim DS daily for addn 7 days to finish treatment course for complicated uti   [facility] Cell: [phone] Pager: [phone]  06/14/2012, 3:53 PM

## 2012-06-14 NOTE — Progress Notes (Signed)
Patient ID: Deanna Burke, female   DOB: 1951/02/26, 62 y.o.   MRN: [id number] 4 Days Post-Op Subjective: Patient reports No significant clinical change. She continues to do well and has been afebrile without significant discomfort. Her nephrostomy tube continues to drain well. Unfortunately interventional radiology was unable to internalize her to a double-J stent.  Objective: Vital signs in last 24 hours: Temp:  [98.1 F (36.7 C)-98.3 F (36.8 C)] 98.1 F (36.7 C) (02/21 0600) Pulse Rate:  [88-98] 94 (02/21 0600) Resp:  [18-23] 20 (02/21 0600) BP: (107-123)/(47-64) 112/48 mmHg (02/21 0600) SpO2:  [98 %-100 %] 98 % (02/21 0600)  Intake/Output from previous day: 02/20 0701 - 02/21 0700 In: 600 [P.O.:600] Out: 225 [Urine:225] Intake/Output this shift:    Physical Exam:  Constitutional: Vital signs reviewed. WD WN in NAD   Eyes: PERRL, No scleral icterus.   Cardiovascular: RRR Pulmonary/Chest: Normal effort Abdominal: Soft. Non-tender, non-distended, bowel sounds are normal, no masses, organomegaly, or guarding present.  Genitourinary: Extremities: No cyanosis or edema   Lab Results:  Recent Labs  06/13/12 0401  HGB 8.8*  HCT 26.2*   BMET  Recent Labs  06/13/12 0401 06/14/12 0355  NA 137 135  K 4.4 4.8  CL 108 106  CO2 19 20  GLUCOSE 101* 103*  BUN 21 16  CREATININE 1.02 1.04  CALCIUM 8.2* 8.6    Recent Labs  06/13/12 0401  INR 1.07   No results found for this basename: LABURIN,  in the last 72 hours Results for orders placed during the hospital encounter of 06/05/12  URINE CULTURE     Status: None   Collection Time    06/05/12  2:39 PM      Result Value Range Status   Specimen Description URINE, CLEAN CATCH   Final   Special Requests NONE   Final   Culture  Setup Time 06/06/2012 01:31   Final   Colony Count >=100,000 COLONIES/ML   Final   Culture PROTEUS MIRABILIS   Final   Report Status 06/07/2012 FINAL   Final   Organism ID, Bacteria  PROTEUS MIRABILIS   Final  CULTURE, BLOOD (ROUTINE X 2)     Status: None   Collection Time    06/05/12  6:09 PM      Result Value Range Status   Specimen Description BLOOD LEFT HAND   Final   Special Requests BOTTLES DRAWN AEROBIC AND ANAEROBIC 5ML   Final   Culture  Setup Time 06/05/2012 23:30   Final   Culture NO GROWTH 5 DAYS   Final   Report Status 06/11/2012 FINAL   Final  CULTURE, BLOOD (ROUTINE X 2)     Status: None   Collection Time    06/05/12  6:15 PM      Result Value Range Status   Specimen Description BLOOD LEFT ASSIST CONTROL   Final   Special Requests BOTTLES DRAWN AEROBIC AND ANAEROBIC 3ML   Final   Culture  Setup Time 06/05/2012 23:30   Final   Culture NO GROWTH 5 DAYS   Final   Report Status 06/11/2012 FINAL   Final  CULTURE, BLOOD (ROUTINE X 2)     Status: None   Collection Time    06/09/12 12:50 PM      Result Value Range Status   Specimen Description BLOOD RIGHT ARM   Final   Special Requests     Final   Value: BOTTLES DRAWN AEROBIC AND ANAEROBIC 5CC BOTH BOTTLES  Culture  Setup Time 06/09/2012 17:32   Final   Culture     Final   Value:        BLOOD CULTURE RECEIVED NO GROWTH TO DATE CULTURE WILL BE HELD FOR 5 DAYS BEFORE ISSUING A FINAL NEGATIVE REPORT   Report Status PENDING   Incomplete  CULTURE, BLOOD (ROUTINE X 2)     Status: None   Collection Time    06/09/12 12:56 PM      Result Value Range Status   Specimen Description BLOOD RIGHT HAND   Final   Special Requests     Final   Value: BOTTLES DRAWN AEROBIC AND ANAEROBIC 2CC BOTH BOTTLES   Culture  Setup Time 06/09/2012 17:32   Final   Culture     Final   Value:        BLOOD CULTURE RECEIVED NO GROWTH TO DATE CULTURE WILL BE HELD FOR 5 DAYS BEFORE ISSUING A FINAL NEGATIVE REPORT   Report Status PENDING   Incomplete  CULTURE, ROUTINE-ABSCESS     Status: None   Collection Time    06/10/12  4:55 PM      Result Value Range Status   Specimen Description OTHER   Final   Special Requests NONE   Final    Gram Stain     Final   Value: ABUNDANT WBC PRESENT,BOTH PMN AND MONONUCLEAR     NO ORGANISMS SEEN   Culture RARE PROTEUS MIRABILIS   Final   Report Status 06/13/2012 FINAL   Final   Organism ID, Bacteria PROTEUS MIRABILIS   Final    Studies/Results: Ir Nephrostomy Tube Change  06/13/2012  *RADIOLOGY REPORT*  Clinical history:62 year old with right hydronephrosis and recent percutaneous nephrostomy tube placement. Anticipate placing an antegrade ureteral stent if possible.  PROCEDURE(S): RIGHT NEPHROSTOGRAM; EXCHANGE OF RIGHT NEPHROSTOMY TUBE WITH FLUOROSCOPY  Physician: Stephan Minister. Henn, MD  Medications:Versed 2 mg, Fentanyl 100 mcg. A radiology nurse monitored the patient for moderate sedation.  Moderate sedation time:20 minutes  Fluoroscopy time: 4 minutes  Procedure:The procedure was explained to the patient.  The risks and benefits of the procedure were discussed and the patient's questions were addressed.  Informed consent was obtained from the patient.  The patient was placed prone and the right flank was prepped and draped in a sterile fashion.  Maximal barrier sterile technique was utilized including caps, mask, sterile gowns, sterile gloves, sterile drape, hand hygiene and skin antiseptic.  Contrast was injected through the nephrostomy tube.  The nephrostomy tube was cut and removed over a Bentson wire.  A 5-French Kumpe catheter was placed.  Additional contrast was injected in the renal pelvis. The right ureter was never identified.  Attempted to cannulate the right ureter with a Glidewire but this was unsuccessful.  As a result, a new 10.2 [place] multipurpose drain was reconstituted over a Bentson wire.  Catheter was sutured to the skin and attached to a gravity bag.  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 Unable to cannulate or visualize the right ureter.  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  Unable to cannulate the  right ureter and unable to place an antegrade ureteral stent.  Successful exchange of the right nephrostomy tube.   Original Report Authenticated By: Markus Daft, M.D.    Ir Nephrostogram Right  06/13/2012  *RADIOLOGY REPORT*  Clinical history:62 year old with right hydronephrosis and recent percutaneous nephrostomy tube placement. Anticipate placing an antegrade ureteral stent if possible.  PROCEDURE(S): RIGHT NEPHROSTOGRAM; EXCHANGE  OF RIGHT NEPHROSTOMY TUBE WITH FLUOROSCOPY  Physician: Stephan Minister. Henn, MD  Medications:Versed 2 mg, Fentanyl 100 mcg. A radiology nurse monitored the patient for moderate sedation.  Moderate sedation time:20 minutes  Fluoroscopy time: 4 minutes  Procedure:The procedure was explained to the patient.  The risks and benefits of the procedure were discussed and the patient's questions were addressed.  Informed consent was obtained from the patient.  The patient was placed prone and the right flank was prepped and draped in a sterile fashion.  Maximal barrier sterile technique was utilized including caps, mask, sterile gowns, sterile gloves, sterile drape, hand hygiene and skin antiseptic.  Contrast was injected through the nephrostomy tube.  The nephrostomy tube was cut and removed over a Bentson wire.  A 5-French Kumpe catheter was placed.  Additional contrast was injected in the renal pelvis. The right ureter was never identified.  Attempted to cannulate the right ureter with a Glidewire but this was unsuccessful.  As a result, a new 10.2 [place] multipurpose drain was reconstituted over a Bentson wire.  Catheter was sutured to the skin and attached to a gravity bag.  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 Unable to cannulate or visualize the right ureter.  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  Unable to cannulate the right ureter and unable to place an antegrade ureteral stent.  Successful exchange of the  right nephrostomy tube.   Original Report Authenticated By: Markus Daft, M.D.     Assessment/Plan:   I see no reason at this time in this Daws cannot be discharged home with her nephrostomy tube. We will need to reassess that renal function and an additional couple of weeks to give it some time to potentially improve. I would plan on outpatient Lasix renal scan. 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 More likely that kidney function will remained poor and we will need to discuss with her nephrectomy. She should be discharged home with an additional 7 day course of an appropriate oral antibiotics. Her most recent culture was positive for Proteus pansensitive.   LOS: 9 days   Kynzli Rease S 06/14/2012, 12:55 PM

## 2012-06-14 NOTE — Care Management (Signed)
Cm spoke with patient concerning discharge planning. Per pt choice [facility] to provide [facility] services of [place]. [place] notified @ [phone]. No other needs stated.   Venita Lick Makye Radle,RN,BSN [phone]

## 2012-06-15 LAB — CULTURE, BLOOD (ROUTINE X 2): Culture: NO GROWTH

## 2012-06-15 MED ORDER — SULFAMETHOXAZOLE-TMP DS 800-160 MG PO TABS
1.0000 | ORAL_TABLET | Freq: Two times a day (BID) | ORAL | Status: DC
  Administered 2012-06-15: 1 via ORAL
  Filled 2012-06-15 (×2): qty 1

## 2012-06-15 MED ORDER — SULFAMETHOXAZOLE-TMP DS 800-160 MG PO TABS: 1.0000 | ORAL_TABLET | Freq: Two times a day (BID) | ORAL | Status: DC

## 2012-06-15 MED ORDER — ASPIRIN 81 MG PO TBEC: 81.0000 mg | DELAYED_RELEASE_TABLET | Freq: Every day | ORAL | Status: DC

## 2012-06-15 NOTE — Discharge Summary (Signed)
Physician Discharge Summary  Deanna Burke YHC:[id number] DOB: October 25, 1950 DOA: 06/05/2012  PCP: [name], MD  Admit date: 06/05/2012 Discharge date: 06/15/2012  Time spent: 30 minutes  Recommendations for Outpatient Follow-up:  1. Follow with Dr Risa Grill for further care of hydronephrosis.  2. Follow up with PCP to consider resume BP medications. She will need Renal function prior to start medications.   Discharge Diagnoses:    Pyelonephritis, acute   History of recurrent UTIs   Hydronephrosis of right kidney   Hyponatremia   Hypokalemia   Acute kidney injury   Discharge Condition: Improved.  Diet recommendation: Heart Healthy.  Filed Weights   06/05/12 1822  Weight: 74.39 kg (164 lb)    History of present illness:  The patient is a 62 y.o. 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 She presented to her PCP on Sunday with low back pain, pressure in her bottom, dysuria. She had been on macrobid 150m BID and her PCP increased her macrobid to 203mBID and uralgesic TID for the last three days. She is followed by infectious disease, Dr. CaMegan SalonShe continued to get worse and have increased weakness. She was having copious loose stool in her ileostomy. She lives alone and finally she decided to come to the emergency department today. In the ER, her UA demonstrated large leukocyte esterase, nitrate negative, WBC 21-50, many bacteria. Sodium 127, potassium 2.9, chloride 86, BUN 41, creatinine 1.42.    Hospital Course:  Acute pyelonephritis  Patient initially started on IV vancomycin and gentamicin. Urine culture growing Proteus. Patient has been febrile. Blood culture negative. Repeated CT abd and shows persistent hydronephrosis with perinephric stranding. Patient was started on Ancef 2 g 8 times a day 5. reevaluated  by urology and had decompression with a nephrostomy tube by IR on 2/17. Urology recommend IR guided antegrade nephrostogram study in the next couple of days followed by hopeful internalization of her nephrostomy tube to a double-J stent. Patient had  Nephrostogram that demonstrated a complete UPJ obstruction. Unable to cannulate the right ureter. Successful nephrostomy tube exchange 2/20. Patient will be discharge on Bactrim for 7 more days.   Hypokalemia  Resolved.  Acute on chronic renal failure stage III.  likely prerenal and associated pyelonephritis  Hold triamterine-HCTZ . Renal function improved with hydration. Mild hyponatremia. Stable.  Hyponatremia  likey from dehydration and being on diuretics  Improved with hydration. Resolved with IV fluids.  DVT prophylaxis: Lovenox.  Hypertension: will hold BP medication at discharge. BP stable off medications.    Procedures: IR guided percutaneous nephrostomy on 2/17, and 2-20.   Consultations:  Dr GrRisa GrillDr SnBaxter Flattery Discharge Exam: Filed Vitals:   06/14/12 1422 06/14/12 2030 06/14/12 2053 06/15/12 0638  BP: 119/55  105/56 135/63  Pulse: 96 106 117 88  Temp: 97.6 F (36.4 C)  98.4 F (36.9 C) 98.1 F (36.7 C)  TempSrc: Oral  Oral Oral  Resp: 20  18 18   Height:      Weight:      SpO2: 99%  98% 98%    General: No distress. Cardiovascular: S 1, S 2 RRR Respiratory: CTA  Discharge Instructions  Discharge Orders   Future Orders Complete By Expires     Diet - low sodium heart healthy  As directed     Increase activity slowly  As directed  Medication List    STOP taking these medications       aspirin 325 MG tablet     nitrofurantoin (macrocrystal-monohydrate) 100 MG capsule  Commonly known as:  MACROBID     triamterene-hydrochlorothiazide 37.5-25 MG per capsule  Commonly known as:  DYAZIDE      TAKE these medications       aspirin 81 MG EC tablet  Take 1 tablet (81 mg total) by mouth daily.      calcium carbonate 600 MG Tabs  Commonly known as:  OS-CAL  Take 600 mg by mouth 2 (two) times daily with a meal.     DEVROM 200 MG Chew  Generic drug:  Bismuth Subgallate  Chew 400 mg by mouth 4 (four) times daily.     diclofenac sodium 1 % Gel  Commonly known as:  VOLTAREN  Apply 2 g topically 4 (four) times daily as needed. For shoulder/ joint pain     ferrous sulfate 325 (65 FE) MG tablet  Take 325 mg by mouth daily with breakfast.     fish oil-omega-3 fatty acids 1000 MG capsule  Take 1 g by mouth every morning.     multivitamin with minerals tablet  Take 1 tablet by mouth daily.     sulfamethoxazole-trimethoprim 800-160 MG per tablet  Commonly known as:  BACTRIM DS  Take 1 tablet by mouth every 12 (twelve) hours.     tamoxifen 10 MG tablet  Commonly known as:  NOLVADEX  Take 20 mg by mouth every morning.     vitamin C 500 MG tablet  Commonly known as:  ASCORBIC ACID  Take 1,000 mg by mouth daily.     vitamin E 400 UNIT capsule  Take 400 Units by mouth every morning.           Follow-up Information   Follow up with [name], MD In 1 week.   Contact information:   [address] [place] [address]       Follow up with [facility], MD In 1 week.   Contact information:   [address] [place] [address] [phone]        The results of significant diagnostics from this hospitalization (including imaging, microbiology, ancillary and laboratory) are listed below for reference.    Significant Diagnostic Studies: Ct Abdomen Pelvis Wo Contrast  06/09/2012  *RADIOLOGY REPORT*  Clinical Data: Pyelonephritis with persistent fever, evaluate for abscess  CT ABDOMEN AND PELVIS WITHOUT CONTRAST  Technique:  Multidetector CT imaging of the abdomen and pelvis was performed following the standard protocol without intravenous contrast.  Comparison: Recent prior CT scan 06/05/2012  Findings:  Lower  Chest:  Areas of subsegmental atelectasis versus chronic scarring noted in both lower lobes.  These regions are less conspicuous than seen on the recent prior study indicating improved inspiratory volumes.  Visualized cardiac structures within normal limits for size.  Trace pericardial fluid versus thickening anteriorly is similar to prior.  Unremarkable distal thoracic esophagus.  Abdomen: Significantly limited evaluation of the solid organs and vasculature in the absence of intravenous contrast material. Within these limitations, unremarkable noncontrasted CT appearance of the stomach and duodenum, spleen, adrenal glands and liver. Gallbladder is collapsed without evidence of cholelithiasis or intra or extrahepatic biliary ductal dilatation.  The pancreas is within normal limits.  No significant interval change in the appearance of marked right-sided hydronephrosis there is mild perinephric stranding about the kidney and renal pelvis similar to the prior study.  The proximal ureter is also dilated. Stable 3 mm nonobstructing stone in the lower pole.  A rounds 5 mm calculus on image 55 of series 2 may be within the ureter or represent a phlebolith in the gonadal vein.  This is similar to prior studies and favored to represent a phlebolith.  The left kidney is unremarkable.  Extensive postsurgical changes throughout the abdomen with a prior colectomy, right lower quadrant ileostomy and hysterectomy.  Stable appearance of a 2.1 by 1.9 cm round low attenuation fluid collection associated with the rectal wall.  The bladder remains distended with urine.  No free fluid or adenopathy.  The Bones: No acute fracture or aggressive appearing lytic or blastic osseous lesion.  Similar appearance of levoconvex scoliosis centered at L2-L3 with extensive associated degenerative changes.  Vascular: Extremely limited evaluation in the absence of intravenous contrast material  IMPRESSION:  1.  No significant interval change in the  appearance of marked right hydronephrosis and dilatation of the proximal right ureter with surrounding perinephric stranding compared to 06/05/2012. Evaluation for pyelonephritis is significantly limited in the absence of intravenous contrast material.  2.  Stable indeterminate 2.3 cm fluid collection in the low anatomic pelvis within the vaginal cuff versus rectal wall which remains incompletely evaluated but stable dating back to at least April 2013.  3.  Extensive intra-abdominal postoperative changes without evidence of obstruction.   Original Report Authenticated By: Jacqulynn Cadet, M.D.    Ct Abdomen Pelvis Wo Contrast  06/05/2012  *RADIOLOGY REPORT*  Clinical Data: Right-sided abdominal pain, history of UTI, history of enlarged right kidney; history of colectomy, ileostomy, appendectomy, hysterectomy, small bowel obstruction, history breast cancer  CT ABDOMEN AND PELVIS WITHOUT CONTRAST  Technique:  Multidetector CT imaging of the abdomen and pelvis was performed following the standard protocol without intravenous contrast.  Comparison: CT abdomen pelvis - 08/03/2011  Findings:  The lack of intravenous contrast limits the ability to evaluate solid abdominal organs.  Right lower quadrant urostomy.  There is now severe hydronephrosis of the right kidney with obstruction likely at the level of the anastomosis with the urostomy (sagittal image 70, series 4), though this is suboptimally evaluated secondary to lack of intravenous contrast.   Minimal amount of asymmetric perinephric stranding. Unchanged obstructing 5 mm stone within the inferior pole of the right kidney (coronal image 21, series 3).  Normal noncontrast appearance of the left kidney.  No left-sided nephrolithiasis or urinary obstruction.  Normal noncontrast appearance of the bilateral adrenal glands, pancreas and spleen.  Normal hepatic contour.  Normal noncontrast appearance of the gallbladder.  No ascites.  There is extensive postsurgical  change of the abdomen within the left lower abdominal quadrant.  There are no dilated loops of bowel to suggest enteric obstruction.  No pneumoperitoneum, pneumatosis or portal venous gas.  Scattered minimal atherosclerotic plaque within a normal caliber abdominal aorta.  No definite retroperitoneal, mesenteric, pelvic or inguinal lymphadenopathy on this noncontrast examination.  Post hysterectomy. There is a grossly unchanged approximately 2.2 x 2.3 cm hypoattenuating fluid collection either within the vaginal cuff or within the rectum (sagittal image 66, series four).  Limited visualization of the lower thorax demonstrates minimal subsegmental atelectasis within the bilateral lower lobes.  Normal heart size.  No pericardial effusion.  No acute or aggressive osseous abnormalities.  Moderate scoliotic curvature of the  thoracolumbar spine with associated degenerative change.  IMPRESSION: 1.  Interval worsening of now severe right-sided pelvocaliectasis with apparent obstruction at the level of the anastomosis with the urostomy, though this is suboptimally evaluated without intravenous contrast.  2.  Extensive postsurgical change of the abdomen without evidence of enteric obstruction. 3.  Unchanged indetermine approximately 2.3 cm fluid collection within the vaginal cuff versus the rectum, incompletely evaluated.   Original Report Authenticated By: Jake Seats, MD    Ir Nephrostomy Tube Change  06/13/2012  *RADIOLOGY REPORT*  Clinical history:62 year old with right hydronephrosis and recent percutaneous nephrostomy tube placement. Anticipate placing an antegrade ureteral stent if possible.  PROCEDURE(S): RIGHT NEPHROSTOGRAM; EXCHANGE OF RIGHT NEPHROSTOMY TUBE WITH FLUOROSCOPY  Physician: Stephan Minister. Henn, MD  Medications:Versed 2 mg, Fentanyl 100 mcg. A radiology nurse monitored the patient for moderate sedation.  Moderate sedation time:20 minutes  Fluoroscopy time: 4 minutes  Procedure:The procedure was explained to  the patient.  The risks and benefits of the procedure were discussed and the patient's questions were addressed.  Informed consent was obtained from the patient.  The patient was placed prone and the right flank was prepped and draped in a sterile fashion.  Maximal barrier sterile technique was utilized including caps, mask, sterile gowns, sterile gloves, sterile drape, hand hygiene and skin antiseptic.  Contrast was injected through the nephrostomy tube.  The nephrostomy tube was cut and removed over a Bentson wire.  A 5-French Kumpe catheter was placed.  Additional contrast was injected in the renal pelvis. The right ureter was never identified.  Attempted to cannulate the right ureter with a Glidewire but this was unsuccessful.  As a result, a new 10.2 [place] multipurpose drain was reconstituted over a Bentson wire.  Catheter was sutured to the skin and attached to a gravity bag.  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 Unable to cannulate or visualize the right ureter.  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  Unable to cannulate the right ureter and unable to place an antegrade ureteral stent.  Successful exchange of the right nephrostomy tube.   Original Report Authenticated By: Markus Daft, M.D.    Ir Nephrostogram Right  06/13/2012  *RADIOLOGY REPORT*  Clinical history:62 year old with right hydronephrosis and recent percutaneous nephrostomy tube placement. Anticipate placing an antegrade ureteral stent if possible.  PROCEDURE(S): RIGHT NEPHROSTOGRAM; EXCHANGE OF RIGHT NEPHROSTOMY TUBE WITH FLUOROSCOPY  Physician: Stephan Minister. Henn, MD  Medications:Versed 2 mg, Fentanyl 100 mcg. A radiology nurse monitored the patient for moderate sedation.  Moderate sedation time:20 minutes  Fluoroscopy time: 4 minutes  Procedure:The procedure was explained to the patient.  The risks and benefits of the procedure were discussed and the patient's  questions were addressed.  Informed consent was obtained from the patient.  The patient was placed prone and the right flank was prepped and draped in a sterile fashion.  Maximal barrier sterile technique was utilized including caps, mask, sterile gowns, sterile gloves, sterile drape, hand hygiene and skin antiseptic.  Contrast was injected through the nephrostomy tube.  The nephrostomy tube was cut and removed over a Bentson wire.  A 5-French Kumpe catheter was placed.  Additional contrast was injected in the renal pelvis. The right ureter was never identified.  Attempted to cannulate the right ureter with a Glidewire but this was unsuccessful.  As a result, a new 10.2 [place] multipurpose drain was reconstituted over a Bentson wire.  Catheter was sutured to the skin and attached to a gravity  bag.  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 Unable to cannulate or visualize the right ureter.  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  Unable to cannulate the right ureter and unable to place an antegrade ureteral stent.  Successful exchange of the right nephrostomy tube.   Original Report Authenticated By: Markus Daft, M.D.    Ir Perc Nephrostomy Right  06/10/2012  *RADIOLOGY REPORT*  Clinical history:62 year-old with fevers and chronic right hydronephrosis.  PROCEDURE(S): PERCUTANEOUS RIGHT NEPHROSTOMY TUBE WITH ULTRASOUND AND FLUOROSCOPIC GUIDANCE  Physician: Stephan Minister. Anselm Pancoast, MD  Medications:Ciprofloxacin 400 mg IV.  Versed 2 mg, Fentanyl 100 mcg.  A radiology nurse monitored the patient for moderate sedation.  Ciprofloxacin was given within two hours of incision.  Moderate sedation time:15 minutes  Fluoroscopy time: 0.7 minutes  Procedure:The procedure was explained to the patient.  The risks and benefits of the procedure were discussed and the patient's questions were addressed.  Informed consent was obtained from the patient.  The patient was  placed prone.  Ultrasound demonstrated an enlarged right renal collecting system.  The right flank was prepped and draped in a sterile fashion.  Maximal barrier sterile technique was utilized including caps, mask, sterile gowns, sterile gloves, sterile drape, hand hygiene and skin antiseptic.  Skin and soft tissues were anesthetized with lidocaine.  The 21 gauge needle was directed into the dilated upper pole collecting system.  Urine was identified in the needle.  A 0.018 wire was advanced into the renal collecting system and an Accustick dilator set was placed. Thick cloudy yellow fluid was aspirated.  A J wire was advanced into the collecting system and the tract was dilated to 10-French. 10.2 [place] multipurpose drain was reconstituted in the renal collecting system.  280 ml of the thick yellow fluid was removed. Catheter sutured to the skin. Fluoroscopic and ultrasound images were taken and saved for documentation.  Findings:Massive right hydronephrosis.  The upper pole collecting system was targeted and used for access.  280 ml of thick yellow fluid was removed.  Sample was sent for culture.  Complications: None  Impression:Successful percutaneous right nephrostomy tube placement.  280 ml of thick yellow fluid was removed.  Findings are concerning for pyonephrosis.  Sample sent for gram stain and culture.   Original Report Authenticated By: Markus Daft, M.D.    Ir US Guide Bx Asp/drain  06/10/2012  *RADIOLOGY REPORT*  Clinical history:62 year-old with fevers and chronic right hydronephrosis.  PROCEDURE(S): PERCUTANEOUS RIGHT NEPHROSTOMY TUBE WITH ULTRASOUND AND FLUOROSCOPIC GUIDANCE  Physician: Stephan Minister. Anselm Pancoast, MD  Medications:Ciprofloxacin 400 mg IV.  Versed 2 mg, Fentanyl 100 mcg.  A radiology nurse monitored the patient for moderate sedation.  Ciprofloxacin was given within two hours of incision.  Moderate sedation time:15 minutes  Fluoroscopy time: 0.7 minutes  Procedure:The procedure was explained to the  patient.  The risks and benefits of the procedure were discussed and the patient's questions were addressed.  Informed consent was obtained from the patient.  The patient was placed prone.  Ultrasound demonstrated an enlarged right renal collecting system.  The right flank was prepped and draped in a sterile fashion.  Maximal barrier sterile technique was utilized including caps, mask, sterile gowns, sterile gloves, sterile drape, hand hygiene and skin antiseptic.  Skin and soft tissues were anesthetized with lidocaine.  The 21 gauge needle was directed into the dilated upper pole collecting system.  Urine was identified in the needle.  A 0.018 wire was advanced into  the renal collecting system and an Accustick dilator set was placed. Thick cloudy yellow fluid was aspirated.  A J wire was advanced into the collecting system and the tract was dilated to 10-French. 10.2 [place] multipurpose drain was reconstituted in the renal collecting system.  280 ml of the thick yellow fluid was removed. Catheter sutured to the skin. Fluoroscopic and ultrasound images were taken and saved for documentation.  Findings:Massive right hydronephrosis.  The upper pole collecting system was targeted and used for access.  280 ml of thick yellow fluid was removed.  Sample was sent for culture.  Complications: None  Impression:Successful percutaneous right nephrostomy tube placement.  280 ml of thick yellow fluid was removed.  Findings are concerning for pyonephrosis.  Sample sent for gram stain and culture.   Original Report Authenticated By: Markus Daft, M.D.     Microbiology: Recent Results (from the past 240 hour(s))  URINE CULTURE     Status: None   Collection Time    06/05/12  2:39 PM      Result Value Range Status   Specimen Description URINE, CLEAN CATCH   Final   Special Requests NONE   Final   Culture  Setup Time 06/06/2012 01:31   Final   Colony Count >=100,000 COLONIES/ML   Final   Culture PROTEUS MIRABILIS   Final    Report Status 06/07/2012 FINAL   Final   Organism ID, Bacteria PROTEUS MIRABILIS   Final  CULTURE, BLOOD (ROUTINE X 2)     Status: None   Collection Time    06/05/12  6:09 PM      Result Value Range Status   Specimen Description BLOOD LEFT HAND   Final   Special Requests BOTTLES DRAWN AEROBIC AND ANAEROBIC 5ML   Final   Culture  Setup Time 06/05/2012 23:30   Final   Culture NO GROWTH 5 DAYS   Final   Report Status 06/11/2012 FINAL   Final  CULTURE, BLOOD (ROUTINE X 2)     Status: None   Collection Time    06/05/12  6:15 PM      Result Value Range Status   Specimen Description BLOOD LEFT ASSIST CONTROL   Final   Special Requests BOTTLES DRAWN AEROBIC AND ANAEROBIC 3ML   Final   Culture  Setup Time 06/05/2012 23:30   Final   Culture NO GROWTH 5 DAYS   Final   Report Status 06/11/2012 FINAL   Final  CULTURE, BLOOD (ROUTINE X 2)     Status: None   Collection Time    06/09/12 12:50 PM      Result Value Range Status   Specimen Description BLOOD RIGHT ARM   Final   Special Requests     Final   Value: BOTTLES DRAWN AEROBIC AND ANAEROBIC 5CC BOTH BOTTLES   Culture  Setup Time 06/09/2012 17:32   Final   Culture     Final   Value:        BLOOD CULTURE RECEIVED NO GROWTH TO DATE CULTURE WILL BE HELD FOR 5 DAYS BEFORE ISSUING A FINAL NEGATIVE REPORT   Report Status PENDING   Incomplete  CULTURE, BLOOD (ROUTINE X 2)     Status: None   Collection Time    06/09/12 12:56 PM      Result Value Range Status   Specimen Description BLOOD RIGHT HAND   Final   Special Requests     Final   Value: BOTTLES DRAWN AEROBIC AND ANAEROBIC 2CC BOTH BOTTLES  Culture  Setup Time 06/09/2012 17:32   Final   Culture     Final   Value:        BLOOD CULTURE RECEIVED NO GROWTH TO DATE CULTURE WILL BE HELD FOR 5 DAYS BEFORE ISSUING A FINAL NEGATIVE REPORT   Report Status PENDING   Incomplete  CULTURE, ROUTINE-ABSCESS     Status: None   Collection Time    06/10/12  4:55 PM      Result Value Range Status    Specimen Description OTHER   Final   Special Requests NONE   Final   Gram Stain     Final   Value: ABUNDANT WBC PRESENT,BOTH PMN AND MONONUCLEAR     NO ORGANISMS SEEN   Culture RARE PROTEUS MIRABILIS   Final   Report Status 06/13/2012 FINAL   Final   Organism ID, Bacteria PROTEUS MIRABILIS   Final     Labs: Basic Metabolic Panel:  Recent Labs Lab 06/11/12 0815 06/12/12 0340 06/13/12 0401 06/14/12 0355  NA 132*  --  137 135  K 4.1  --  4.4 4.8  CL 97  --  108 106  CO2 21  --  19 20  GLUCOSE 105*  --  101* 103*  BUN 16  --  21 16  CREATININE 1.32* 1.22* 1.02 1.04  CALCIUM 8.9  --  8.2* 8.6   CBC:  Recent Labs Lab 06/10/12 1040 06/11/12 0815 06/13/12 0401  WBC 7.4 8.5 7.4  NEUTROABS 6.5 7.5  --   HGB 9.5* 8.8* 8.8*  HCT 27.3* 26.6* 26.2*  MCV 89.5 89.3 90.7  PLT 447* 451* 480*   Cardiac Enzymes: No results found for this basename: CKTOTAL, CKMB, CKMBINDEX, TROPONINI,  in the last 168 hours BNP: BNP (last 3 results) No results found for this basename: PROBNP,  in the last 8760 hours CBG: No results found for this basename: GLUCAP,  in the last 168 hours     Signed:  Briyah Wheelwright  [facility] 06/15/2012, 8:05 AM

## 2012-06-15 NOTE — Progress Notes (Signed)
Pt D/C home, alert and oriented , no pain, vital signs within normal limits for patient. No new complains, D/C instructions done, medication administration instructions done, Pt verbalizes understands Understanding.

## 2012-06-17 ENCOUNTER — Other Ambulatory Visit (HOSPITAL_COMMUNITY): Payer: Self-pay | Admitting: Urology

## 2012-06-17 DIAGNOSIS — N1330 Unspecified hydronephrosis: Secondary | ICD-10-CM

## 2012-06-19 ENCOUNTER — Other Ambulatory Visit: Payer: Self-pay | Admitting: *Deleted

## 2012-06-19 MED ORDER — TAMOXIFEN CITRATE 20 MG PO TABS: 20.0000 mg | ORAL_TABLET | Freq: Every day | ORAL | Status: DC

## 2012-06-25 ENCOUNTER — Telehealth: Payer: Self-pay | Admitting: Oncology

## 2012-06-25 NOTE — Telephone Encounter (Signed)
S/w the pt and she is aware of her may 2014 appts

## 2012-07-02 ENCOUNTER — Encounter (HOSPITAL_COMMUNITY): Payer: BC Managed Care – PPO

## 2012-07-10 ENCOUNTER — Ambulatory Visit (HOSPITAL_COMMUNITY)
Admission: RE | Admit: 2012-07-10 | Discharge: 2012-07-10 | Disposition: A | Discharge status: Home / Self Care | Payer: BC Managed Care – PPO | Source: Ambulatory Visit | Attending: Urology | Admitting: Urology

## 2012-07-10 DIAGNOSIS — N135 Crossing vessel and stricture of ureter without hydronephrosis: Secondary | ICD-10-CM | POA: Insufficient documentation

## 2012-07-10 DIAGNOSIS — Z936 Other artificial openings of urinary tract status: Secondary | ICD-10-CM | POA: Insufficient documentation

## 2012-07-10 DIAGNOSIS — N1330 Unspecified hydronephrosis: Secondary | ICD-10-CM | POA: Insufficient documentation

## 2012-07-10 MED ORDER — TECHNETIUM TC 99M MERTIATIDE
15.4000 | Freq: Once | INTRAVENOUS | Status: AC | PRN
  Administered 2012-07-10: 15 via INTRAVENOUS

## 2012-07-10 MED ORDER — FUROSEMIDE 10 MG/ML IJ SOLN
40.0000 mg | Freq: Once | INTRAMUSCULAR | Status: DC
  Filled 2012-07-10: qty 4

## 2012-07-25 ENCOUNTER — Other Ambulatory Visit: Payer: Self-pay | Admitting: Urology

## 2012-08-06 ENCOUNTER — Encounter (HOSPITAL_COMMUNITY): Payer: Self-pay | Admitting: Pharmacy Technician

## 2012-08-13 ENCOUNTER — Encounter (HOSPITAL_COMMUNITY)
Admission: RE | Admit: 2012-08-13 | Discharge: 2012-08-13 | Disposition: A | Discharge status: Home / Self Care | Payer: BC Managed Care – PPO | Source: Ambulatory Visit | Attending: Urology | Admitting: Urology

## 2012-08-13 ENCOUNTER — Encounter (HOSPITAL_COMMUNITY): Payer: Self-pay

## 2012-08-13 DIAGNOSIS — IMO0002 Reserved for concepts with insufficient information to code with codable children: Secondary | ICD-10-CM

## 2012-08-13 HISTORY — DX: Reserved for concepts with insufficient information to code with codable children: IMO0002

## 2012-08-13 LAB — BASIC METABOLIC PANEL
CO2: 29 mEq/L (ref 19–32)
Calcium: 10.3 mg/dL (ref 8.4–10.5)
GFR calc Af Amer: 42 mL/min — ABNORMAL LOW (ref >90)
GFR calc non Af Amer: 37 mL/min — ABNORMAL LOW (ref >90)
Sodium: 136 mEq/L (ref 135–145)

## 2012-08-13 LAB — SURGICAL PCR SCREEN: MRSA, PCR: NEGATIVE

## 2012-08-13 LAB — CBC
MCH: 30 pg (ref 26.0–34.0)
MCHC: 33.4 g/dL (ref 30.0–36.0)
Platelets: 251 10*3/uL (ref 150–400)
RBC: 4.16 MIL/uL (ref 3.87–5.11)

## 2012-08-13 MED ORDER — GENTAMICIN SULFATE 40 MG/ML IJ SOLN
400.0000 mg | INTRAVENOUS | Status: AC
  Administered 2012-08-14: 400 mg via INTRAVENOUS
  Filled 2012-08-13: qty 10

## 2012-08-13 NOTE — Patient Instructions (Signed)
YOUR SURGERY IS SCHEDULED AT [facility]  ON:   Wednesday  4/23  REPORT TO [facility] AT:  6:OO AM      PHONE # FOR SHORT STAY IS [phone]  DO NOT EAT OR DRINK ANYTHING AFTER MIDNIGHT THE NIGHT BEFORE YOUR SURGERY.  YOU MAY BRUSH YOUR TEETH, RINSE OUT YOUR MOUTH--BUT NO WATER, NO FOOD, NO CHEWING GUM, NO MINTS, NO CANDIES, NO CHEWING TOBACCO.  PLEASE TAKE THE FOLLOWING MEDICATIONS THE AM OF YOUR SURGERY WITH A FEW SIPS OF WATER:  TAMOXIFEN, BISMUTH SUBGALLATE   IF YOU HAVE SLEEP APNEA AND USE CPAP OR BIPAP--PLEASE BRING THE MASK AND THE TUBING.  DO NOT BRING YOUR MACHINE.  DO NOT BRING VALUABLES, MONEY, CREDIT CARDS.  DO NOT WEAR JEWELRY, MAKE-UP, NAIL POLISH AND NO METAL PINS OR CLIPS IN YOUR HAIR. CONTACT LENS, DENTURES / PARTIALS, GLASSES SHOULD NOT BE WORN TO SURGERY AND IN MOST CASES-HEARING AIDS WILL NEED TO BE REMOVED.  BRING YOUR GLASSES CASE, ANY EQUIPMENT NEEDED FOR YOUR CONTACT LENS. FOR PATIENTS ADMITTED TO THE HOSPITAL--CHECK OUT TIME THE DAY OF DISCHARGE IS 11:00 AM.  ALL INPATIENT ROOMS ARE PRIVATE - WITH BATHROOM, TELEPHONE, TELEVISION AND WIFI INTERNET.                                PLEASE READ OVER ANY  FACT SHEETS THAT YOU WERE GIVEN: MRSA INFORMATION, BLOOD TRANSFUSION INFORMATION FAILURE TO FOLLOW THESE INSTRUCTIONS MAY RESULT IN THE CANCELLATION OF YOUR SURGERY.   PATIENT SIGNATURE_________________________________

## 2012-08-13 NOTE — Pre-Procedure Instructions (Signed)
CXR REPORT IN EPIC FROM 04/17/12.  EKG WAS DONE TODAY - PREOP - AT [place] DR. MANNY ASKED HER TO BE ON CLEAR LIQUID DIET TODAY - NO OTHER BOWEL PREP NEEDED AS SHE HAS ILEOSTOMY

## 2012-08-13 NOTE — Pre-Procedure Instructions (Signed)
PT'S PREOP BMET REPORT FAXED TO DR. MANNY'S OFFICE FOR REVIEW OF ELEVATED BUN AND CREATININE.

## 2012-08-14 ENCOUNTER — Encounter (HOSPITAL_COMMUNITY): Payer: Self-pay | Admitting: Anesthesiology

## 2012-08-14 ENCOUNTER — Inpatient Hospital Stay (HOSPITAL_COMMUNITY)
Admission: RE | Admit: 2012-08-14 | Discharge: 2012-08-18 | DRG: 305 | Disposition: A | Discharge status: Home / Self Care | Payer: BC Managed Care – PPO | Source: Ambulatory Visit | Attending: Urology | Admitting: Urology

## 2012-08-14 ENCOUNTER — Encounter (HOSPITAL_COMMUNITY)
Admission: RE | Disposition: A | Discharge status: Home / Self Care | Payer: Self-pay | Source: Ambulatory Visit | Attending: Urology

## 2012-08-14 ENCOUNTER — Inpatient Hospital Stay (HOSPITAL_COMMUNITY): Payer: BC Managed Care – PPO | Admitting: Anesthesiology

## 2012-08-14 ENCOUNTER — Encounter (HOSPITAL_COMMUNITY): Payer: Self-pay | Admitting: *Deleted

## 2012-08-14 DIAGNOSIS — Z932 Ileostomy status: Secondary | ICD-10-CM

## 2012-08-14 DIAGNOSIS — Q602 Renal agenesis, unspecified: Secondary | ICD-10-CM

## 2012-08-14 DIAGNOSIS — N119 Chronic tubulo-interstitial nephritis, unspecified: Secondary | ICD-10-CM | POA: Diagnosis present

## 2012-08-14 DIAGNOSIS — Z9049 Acquired absence of other specified parts of digestive tract: Secondary | ICD-10-CM

## 2012-08-14 DIAGNOSIS — N1330 Unspecified hydronephrosis: Principal | ICD-10-CM

## 2012-08-14 DIAGNOSIS — E669 Obesity, unspecified: Secondary | ICD-10-CM | POA: Diagnosis present

## 2012-08-14 DIAGNOSIS — Z853 Personal history of malignant neoplasm of breast: Secondary | ICD-10-CM

## 2012-08-14 DIAGNOSIS — H16129 Filamentary keratitis, unspecified eye: Secondary | ICD-10-CM | POA: Diagnosis not present

## 2012-08-14 DIAGNOSIS — G4730 Sleep apnea, unspecified: Secondary | ICD-10-CM | POA: Diagnosis present

## 2012-08-14 DIAGNOSIS — Z01812 Encounter for preprocedural laboratory examination: Secondary | ICD-10-CM

## 2012-08-14 DIAGNOSIS — Q6239 Other obstructive defects of renal pelvis and ureter: Secondary | ICD-10-CM

## 2012-08-14 DIAGNOSIS — H5710 Ocular pain, unspecified eye: Secondary | ICD-10-CM | POA: Diagnosis not present

## 2012-08-14 DIAGNOSIS — Z87440 Personal history of urinary (tract) infections: Secondary | ICD-10-CM

## 2012-08-14 HISTORY — PX: LAPAROSCOPIC NEPHRECTOMY: SHX1930

## 2012-08-14 LAB — POCT I-STAT 7, (LYTES, BLD GAS, ICA,H+H)
Acid-base deficit: 1 mmol/L (ref 0.0–2.0)
Bicarbonate: 26 mEq/L — ABNORMAL HIGH (ref 20.0–24.0)
HCT: 34 % — ABNORMAL LOW (ref 36.0–46.0)
Hemoglobin: 11.6 g/dL — ABNORMAL LOW (ref 12.0–15.0)
Hemoglobin: 11.6 g/dL — ABNORMAL LOW (ref 12.0–15.0)
Patient temperature: 35.3
Potassium: 2.7 mEq/L — CL (ref 3.5–5.1)
Potassium: 2.8 mEq/L — ABNORMAL LOW (ref 3.5–5.1)
Sodium: 136 mEq/L (ref 135–145)
TCO2: 28 mmol/L (ref 0–100)
TCO2: 28 mmol/L (ref 0–100)
pCO2 arterial: 48.6 mmHg — ABNORMAL HIGH (ref 35.0–45.0)
pH, Arterial: 7.325 — ABNORMAL LOW (ref 7.350–7.450)
pH, Arterial: 7.328 — ABNORMAL LOW (ref 7.350–7.450)

## 2012-08-14 LAB — BASIC METABOLIC PANEL
CO2: 26 mEq/L (ref 19–32)
Chloride: 101 mEq/L (ref 96–112)
GFR calc Af Amer: 70 mL/min — ABNORMAL LOW (ref >90)
Sodium: 136 mEq/L (ref 135–145)

## 2012-08-14 LAB — HEMOGLOBIN AND HEMATOCRIT, BLOOD: HCT: 33.6 % — ABNORMAL LOW (ref 36.0–46.0)

## 2012-08-14 LAB — TYPE AND SCREEN
ABO/RH(D): O POS
Antibody Screen: NEGATIVE

## 2012-08-14 SURGERY — NEPHRECTOMY, RADICAL, LAPAROSCOPIC, ADULT
Anesthesia: General | Laterality: Right | Wound class: Clean Contaminated

## 2012-08-14 MED ORDER — POTASSIUM CHLORIDE 10 MEQ/100ML IV SOLN
10.0000 meq | INTRAVENOUS | Status: DC
  Administered 2012-08-14 (×2): 10 meq via INTRAVENOUS
  Filled 2012-08-14 (×3): qty 100

## 2012-08-14 MED ORDER — LACTATED RINGERS IV SOLN: INTRAVENOUS | Status: DC

## 2012-08-14 MED ORDER — PROPOFOL 10 MG/ML IV BOLUS
INTRAVENOUS | Status: DC | PRN
  Administered 2012-08-14: 160 mg via INTRAVENOUS

## 2012-08-14 MED ORDER — 0.9 % SODIUM CHLORIDE (POUR BTL) OPTIME
TOPICAL | Status: DC | PRN
  Administered 2012-08-14: 1000 mL

## 2012-08-14 MED ORDER — SODIUM CHLORIDE 0.9 % IR SOLN
Status: DC | PRN
  Administered 2012-08-14: 1000 mL

## 2012-08-14 MED ORDER — ONDANSETRON HCL 4 MG/2ML IJ SOLN
4.0000 mg | INTRAMUSCULAR | Status: DC | PRN
  Administered 2012-08-14 (×2): 4 mg via INTRAVENOUS
  Filled 2012-08-14 (×2): qty 2

## 2012-08-14 MED ORDER — BUPIVACAINE LIPOSOME 1.3 % IJ SUSP
20.0000 mL | Freq: Once | INTRAMUSCULAR | Status: DC
  Filled 2012-08-14: qty 20

## 2012-08-14 MED ORDER — CIPROFLOXACIN IN D5W 400 MG/200ML IV SOLN
400.0000 mg | Freq: Two times a day (BID) | INTRAVENOUS | Status: AC
  Administered 2012-08-14 – 2012-08-15 (×2): 400 mg via INTRAVENOUS
  Filled 2012-08-14 (×2): qty 200

## 2012-08-14 MED ORDER — HYDROMORPHONE HCL PF 1 MG/ML IJ SOLN: 0.2500 mg | INTRAMUSCULAR | Status: DC | PRN

## 2012-08-14 MED ORDER — SENNA 8.6 MG PO TABS
1.0000 | ORAL_TABLET | Freq: Two times a day (BID) | ORAL | Status: DC
  Administered 2012-08-14: 8.6 mg via ORAL
  Filled 2012-08-14 (×5): qty 1

## 2012-08-14 MED ORDER — OXYCODONE HCL 5 MG PO TABS
5.0000 mg | ORAL_TABLET | ORAL | Status: DC | PRN
  Administered 2012-08-14 – 2012-08-18 (×8): 5 mg via ORAL
  Filled 2012-08-14 (×8): qty 1

## 2012-08-14 MED ORDER — PROMETHAZINE HCL 25 MG/ML IJ SOLN: 6.2500 mg | INTRAMUSCULAR | Status: DC | PRN

## 2012-08-14 MED ORDER — ONDANSETRON HCL 4 MG/2ML IJ SOLN
INTRAMUSCULAR | Status: DC | PRN
  Administered 2012-08-14: 4 mg via INTRAVENOUS

## 2012-08-14 MED ORDER — VANCOMYCIN HCL IN DEXTROSE 1-5 GM/200ML-% IV SOLN
INTRAVENOUS | Status: AC
  Filled 2012-08-14: qty 200

## 2012-08-14 MED ORDER — LIDOCAINE HCL (CARDIAC) 20 MG/ML IV SOLN
INTRAVENOUS | Status: DC | PRN
  Administered 2012-08-14: 60 mg via INTRAVENOUS

## 2012-08-14 MED ORDER — MIDAZOLAM HCL 5 MG/5ML IJ SOLN
INTRAMUSCULAR | Status: DC | PRN
  Administered 2012-08-14: 2 mg via INTRAVENOUS

## 2012-08-14 MED ORDER — LACTATED RINGERS IV SOLN
INTRAVENOUS | Status: DC | PRN
  Administered 2012-08-14 (×2): via INTRAVENOUS

## 2012-08-14 MED ORDER — MANNITOL 25 % IV SOLN
50.0000 g | INTRAVENOUS | Status: DC
  Filled 2012-08-14: qty 200

## 2012-08-14 MED ORDER — KCL IN DEXTROSE-NACL 20-5-0.45 MEQ/L-%-% IV SOLN
INTRAVENOUS | Status: DC
  Administered 2012-08-14 – 2012-08-15 (×3): via INTRAVENOUS
  Filled 2012-08-14 (×4): qty 1000

## 2012-08-14 MED ORDER — HYDROMORPHONE HCL PF 1 MG/ML IJ SOLN
INTRAMUSCULAR | Status: DC | PRN
  Administered 2012-08-14 (×6): .4 mg via INTRAVENOUS

## 2012-08-14 MED ORDER — TAMOXIFEN CITRATE 10 MG PO TABS
20.0000 mg | ORAL_TABLET | Freq: Every day | ORAL | Status: DC
  Administered 2012-08-15 – 2012-08-18 (×4): 20 mg via ORAL
  Filled 2012-08-14 (×5): qty 2

## 2012-08-14 MED ORDER — GLYCOPYRROLATE 0.2 MG/ML IJ SOLN
INTRAMUSCULAR | Status: DC | PRN
  Administered 2012-08-14: 0.6 mg via INTRAVENOUS

## 2012-08-14 MED ORDER — TAMOXIFEN CITRATE 20 MG PO TABS: 20.0000 mg | ORAL_TABLET | Freq: Every morning | ORAL | Status: DC

## 2012-08-14 MED ORDER — TRIAMTERENE-HCTZ 37.5-25 MG PO TABS
1.0000 | ORAL_TABLET | Freq: Every morning | ORAL | Status: DC
  Administered 2012-08-15 – 2012-08-18 (×4): 1 via ORAL
  Filled 2012-08-14 (×4): qty 1

## 2012-08-14 MED ORDER — CISATRACURIUM BESYLATE (PF) 10 MG/5ML IV SOLN
INTRAVENOUS | Status: DC | PRN
  Administered 2012-08-14 (×2): 4 mg via INTRAVENOUS
  Administered 2012-08-14 (×2): 2 mg via INTRAVENOUS
  Administered 2012-08-14: 4 mg via INTRAVENOUS
  Administered 2012-08-14: 16 mg via INTRAVENOUS

## 2012-08-14 MED ORDER — PHENYLEPHRINE HCL 10 MG/ML IJ SOLN
INTRAMUSCULAR | Status: DC | PRN
  Administered 2012-08-14 (×2): 120 ug via INTRAVENOUS

## 2012-08-14 MED ORDER — SODIUM CHLORIDE 0.9 % IJ SOLN
INTRAMUSCULAR | Status: DC | PRN
  Administered 2012-08-14: 13:00:00

## 2012-08-14 MED ORDER — ACETAMINOPHEN 10 MG/ML IV SOLN
1000.0000 mg | Freq: Four times a day (QID) | INTRAVENOUS | Status: AC
  Administered 2012-08-14 – 2012-08-15 (×4): 1000 mg via INTRAVENOUS
  Filled 2012-08-14 (×4): qty 100

## 2012-08-14 MED ORDER — POTASSIUM CHLORIDE 10 MEQ/100ML IV SOLN: INTRAVENOUS | Status: DC | PRN

## 2012-08-14 MED ORDER — DEXAMETHASONE SODIUM PHOSPHATE 10 MG/ML IJ SOLN
INTRAMUSCULAR | Status: DC | PRN
  Administered 2012-08-14: 10 mg via INTRAVENOUS

## 2012-08-14 MED ORDER — FENTANYL CITRATE 0.05 MG/ML IJ SOLN
25.0000 ug | INTRAMUSCULAR | Status: DC | PRN
  Administered 2012-08-15 – 2012-08-16 (×2): 25 ug via INTRAVENOUS
  Filled 2012-08-14 (×2): qty 2

## 2012-08-14 MED ORDER — VANCOMYCIN HCL IN DEXTROSE 1-5 GM/200ML-% IV SOLN
1000.0000 mg | INTRAVENOUS | Status: AC
  Administered 2012-08-14: 1000 mg via INTRAVENOUS

## 2012-08-14 MED ORDER — FENTANYL CITRATE 0.05 MG/ML IJ SOLN
INTRAMUSCULAR | Status: DC | PRN
  Administered 2012-08-14 (×4): 50 ug via INTRAVENOUS
  Administered 2012-08-14: 100 ug via INTRAVENOUS
  Administered 2012-08-14: 50 ug via INTRAVENOUS

## 2012-08-14 MED ORDER — NEOSTIGMINE METHYLSULFATE 1 MG/ML IJ SOLN
INTRAMUSCULAR | Status: DC | PRN
  Administered 2012-08-14: 3.5 mg via INTRAVENOUS

## 2012-08-14 MED ORDER — DOCUSATE SODIUM 100 MG PO CAPS
100.0000 mg | ORAL_CAPSULE | Freq: Two times a day (BID) | ORAL | Status: DC
  Administered 2012-08-14 – 2012-08-15 (×2): 100 mg via ORAL
  Filled 2012-08-14 (×9): qty 1

## 2012-08-14 SURGICAL SUPPLY — 76 items
ADH SKN CLS APL DERMABOND .7 (GAUZE/BANDAGES/DRESSINGS) ×2
APL SKNCLS STERI-STRIP NONHPOA (GAUZE/BANDAGES/DRESSINGS) ×1
BAG SPEC THK2 15X12 ZIP CLS (MISCELLANEOUS) ×1
BAG ZIPLOCK 12X15 (MISCELLANEOUS) ×2 IMPLANT
BENZOIN TINCTURE PRP APPL 2/3 (GAUZE/BANDAGES/DRESSINGS) ×2 IMPLANT
BLADE EXTENDED COATED 6.5IN (ELECTRODE) ×1 IMPLANT
BLADE SURG SZ10 CARB STEEL (BLADE) ×1 IMPLANT
CANISTER SUCTION 2500CC (MISCELLANEOUS) ×2 IMPLANT
CHLORAPREP W/TINT 26ML (MISCELLANEOUS) ×2 IMPLANT
CLIP LIGATING HEM O LOK PURPLE (MISCELLANEOUS) ×3 IMPLANT
CLIP LIGATING HEMO LOK XL GOLD (MISCELLANEOUS) ×1 IMPLANT
CLIP LIGATING HEMO O LOK GREEN (MISCELLANEOUS) ×3 IMPLANT
CLOTH BEACON ORANGE TIMEOUT ST (SAFETY) ×2 IMPLANT
COVER SURGICAL LIGHT HANDLE (MISCELLANEOUS) ×2 IMPLANT
CUTTER ECHEON FLEX ENDO 45 340 (ENDOMECHANICALS) ×1 IMPLANT
CUTTER FLEX LINEAR 45M (STAPLE) IMPLANT
DERMABOND ADVANCED (GAUZE/BANDAGES/DRESSINGS) ×2
DERMABOND ADVANCED .7 DNX12 (GAUZE/BANDAGES/DRESSINGS) ×1 IMPLANT
DISSECT BALLN SPACEMKR + OVL (BALLOONS) ×2
DISSECTOR BALLN SPACEMKR + OVL (BALLOONS) IMPLANT
DRAIN CHANNEL 10F 3/8 F FF (DRAIN) IMPLANT
DRAPE CAMERA CLOSED 9X96 (DRAPES) ×1 IMPLANT
DRAPE INCISE IOBAN 66X45 STRL (DRAPES) ×2 IMPLANT
DRAPE LAPAROSCOPIC ABDOMINAL (DRAPES) ×2 IMPLANT
DRAPE UTILITY XL STRL (DRAPES) ×1 IMPLANT
DRAPE WARM FLUID 44X44 (DRAPE) ×1 IMPLANT
DRSG TEGADERM 4X4.75 (GAUZE/BANDAGES/DRESSINGS) ×1 IMPLANT
ELECT REM PT RETURN 9FT ADLT (ELECTROSURGICAL) ×2
ELECTRODE REM PT RTRN 9FT ADLT (ELECTROSURGICAL) ×1 IMPLANT
EVACUATOR SILICONE 100CC (DRAIN) IMPLANT
FLOSEAL (HEMOSTASIS) IMPLANT
FLOSEAL 10ML (HEMOSTASIS) ×2 IMPLANT
GLOVE BIOGEL M STRL SZ7.5 (GLOVE) ×3 IMPLANT
GOWN STRL NON-REIN LRG LVL3 (GOWN DISPOSABLE) ×3 IMPLANT
HEMOSTAT SURGICEL 4X8 (HEMOSTASIS) ×1 IMPLANT
KIT BASIN OR (CUSTOM PROCEDURE TRAY) ×2 IMPLANT
PENCIL BUTTON HOLSTER BLD 10FT (ELECTRODE) ×2 IMPLANT
POSITIONER SURGICAL ARM (MISCELLANEOUS) ×3 IMPLANT
POUCH ENDO CATCH II 15MM (MISCELLANEOUS) ×2 IMPLANT
RELOAD 45 VASCULAR/THIN (ENDOMECHANICALS) IMPLANT
RELOAD STAPLE 45 2.5 WHT GRN (ENDOMECHANICALS) IMPLANT
RELOAD WH ECHELON 45 (STAPLE) ×7 IMPLANT
RELOAD WHITE ECR60W (STAPLE) ×2 IMPLANT
RETRACT II ENDO 10MM 32CML (ENDOMECHANICALS) ×2
RETRACTOR II ENDO 10MM 32CML (ENDOMECHANICALS) IMPLANT
RETRACTOR LAPSCP 12X46 CVD (ENDOMECHANICALS) IMPLANT
RTRCTR LAPSCP 12X46 CVD (ENDOMECHANICALS)
SCALPEL HARMONIC ACE (MISCELLANEOUS) ×2 IMPLANT
SCISSORS LAP 5X35 DISP (ENDOMECHANICALS) IMPLANT
SET IRRIG TUBING LAPAROSCOPIC (IRRIGATION / IRRIGATOR) ×2 IMPLANT
SOLUTION ANTI FOG 6CC (MISCELLANEOUS) ×3 IMPLANT
SPONGE GAUZE 4X4 12PLY (GAUZE/BANDAGES/DRESSINGS) ×1 IMPLANT
SPONGE LAP 18X18 X RAY DECT (DISPOSABLE) ×3 IMPLANT
SPONGE LAP 4X18 X RAY DECT (DISPOSABLE) ×2 IMPLANT
SPONGE SURGIFOAM ABS GEL 100 (HEMOSTASIS) IMPLANT
STAPLE ECHEON FLEX 60 POW ENDO (STAPLE) ×1 IMPLANT
STRIP CLOSURE SKIN 1/4X4 (GAUZE/BANDAGES/DRESSINGS) ×1 IMPLANT
SUT ETHILON 3 0 PS 1 (SUTURE) IMPLANT
SUT MNCRL AB 4-0 PS2 18 (SUTURE) ×4 IMPLANT
SUT PDS AB 1 CT1 27 (SUTURE) ×3 IMPLANT
SUT PDS AB 1 CTX 36 (SUTURE) ×2 IMPLANT
SUT VIC AB 2-0 SH 27 (SUTURE) ×2
SUT VIC AB 2-0 SH 27X BRD (SUTURE) ×2 IMPLANT
SUT VICRYL 0 UR6 27IN ABS (SUTURE) ×6 IMPLANT
TAPE STRIPS DRAPE STRL (GAUZE/BANDAGES/DRESSINGS) ×1 IMPLANT
TOWEL OR 17X26 10 PK STRL BLUE (TOWEL DISPOSABLE) ×2 IMPLANT
TOWEL OR NON WOVEN STRL DISP B (DISPOSABLE) ×2 IMPLANT
TRAY FOLEY CATH 14FRSI W/METER (CATHETERS) ×2 IMPLANT
TRAY LAP CHOLE (CUSTOM PROCEDURE TRAY) ×2 IMPLANT
TROCAR BLADELESS OPT 5 100 (ENDOMECHANICALS) ×2 IMPLANT
TROCAR BLADELESS OPT 5 75 (ENDOMECHANICALS) ×2 IMPLANT
TROCAR ENDOPATH XCEL 12X100 BL (ENDOMECHANICALS) ×1 IMPLANT
TROCAR XCEL 12X100 BLDLESS (ENDOMECHANICALS) ×2 IMPLANT
TROCAR XCEL BLUNT TIP 100MML (ENDOMECHANICALS) ×1 IMPLANT
TUBING INSUFFLATION 10FT LAP (TUBING) ×2 IMPLANT
YANKAUER SUCT BULB TIP 10FT TU (MISCELLANEOUS) ×2 IMPLANT

## 2012-08-14 NOTE — Transfer of Care (Signed)
Immediate Anesthesia Transfer of Care Note  Patient: Deanna Burke  Procedure(s) Performed: Procedure(s) (LRB): RIGHT LAPAROSCOPIC RETROPERITONEAL LAPAROSCOPIC NEPHRECTOMY  (Right)  Patient Location: PACU  Anesthesia Type: General  Level of Consciousness: sedated, patient cooperative and responds to stimulaton  Airway & Oxygen Therapy: Patient Spontanous Breathing and Patient connected to face mask oxgen  Post-op Assessment: Report given to PACU RN and Post -op Vital signs reviewed and stable  Post vital signs: Reviewed and stable  Complications: No apparent anesthesia complications

## 2012-08-14 NOTE — Anesthesia Postprocedure Evaluation (Signed)
  Anesthesia Post-op Note  Patient: Deanna Burke  Procedure(s) Performed: Procedure(s) (LRB): RIGHT LAPAROSCOPIC RETROPERITONEAL LAPAROSCOPIC NEPHRECTOMY  (Right)  Patient Location: PACU  Anesthesia Type: General  Level of Consciousness: awake and alert   Airway and Oxygen Therapy: Patient Spontanous Breathing  Post-op Pain: mild  Post-op Assessment: Post-op Vital signs reviewed, Patient's Cardiovascular Status Stable, Respiratory Function Stable, Patent Airway and No signs of Nausea or vomiting  Last Vitals:  Filed Vitals:   08/14/12 1400  BP:   Pulse: 80  Temp:   Resp: 20    Post-op Vital Signs: stable   Complications: No apparent anesthesia complications

## 2012-08-14 NOTE — Care Management Note (Signed)
    Page 1 of 1   08/14/2012     3:57:29 PM   CARE MANAGEMENT NOTE 08/14/2012  Patient:  Deanna Burke, Deanna Burke   Account Number:  [id number]  Date Initiated:  08/14/2012  Documentation initiated by:  Dessa Phi  Subjective/Objective Assessment:   ADMITTED W/RECURRENT UTI,R RENAL ATROPHY.     Action/Plan:   FROM HOME.HAS PCP,PHARMACY.   Anticipated DC Date:  08/16/2012   Anticipated DC Plan:  [place]  CM consult      Choice offered to / List presented to:             Status of service:  In process, will continue to follow Medicare Important Message given?   (If response is "NO", the following Medicare IM given date fields will be blank) Date Medicare IM given:   Date Additional Medicare IM given:    Discharge Disposition:    Per UR Regulation:  Reviewed for med. necessity/level of care/duration of stay  If discussed at [place] of Stay Meetings, dates discussed:    Comments:  08/14/12 Tejuan Gholson RN,BSN NCM [phone] S/P R LAP NEPHRECTOMY.

## 2012-08-14 NOTE — Brief Op Note (Signed)
08/14/2012  1:26 PM  PATIENT:  Deanna Burke  62 y.o. female  PRE-OPERATIVE DIAGNOSIS:  RIGHT ATROPHIC KIDNEY, HISTORY OF PYELONEPHRITIS  POST-OPERATIVE DIAGNOSIS:  RIGHT ATROPHIC KIDNEY, HISTORY OF PYELONEPHRITIS  PROCEDURE:  Procedure(s) with comments: RIGHT LAPAROSCOPIC RETROPERITONEAL LAPAROSCOPIC NEPHRECTOMY  (Right) - RIGHT LAPAROSCOPIC RETROPERITONEAL LAPAROSCOPIC NEPHRECTOMY, POSSIBLE OPEN   SURGEON:  Surgeon(s) and Role:    * Alexis Frock, MD - Primary  PHYSICIAN ASSISTANT:   ASSISTANTS: Felipa Furnace, PA   ANESTHESIA:   local and general  EBL:  Total I/O In: 1000 [I.V.:1000] Out: 560 [Urine:260; Blood:300]  BLOOD ADMINISTERED:none  DRAINS: Foley   LOCAL MEDICATIONS USED:  MARCAINE     SPECIMEN:  Source of Specimen:  Rt Atrophic Kidney  DISPOSITION OF SPECIMEN:  PATHOLOGY  COUNTS:  YES  TOURNIQUET:  * No tourniquets in log *  DICTATION: .Other Dictation: Dictation Number [id number]*  PLAN OF CARE: Admit to inpatient   PATIENT DISPOSITION:  PACU - hemodynamically stable.   Delay start of Pharmacological VTE agent (>24hrs) due to surgical blood loss or risk of bleeding: yes

## 2012-08-14 NOTE — H&P (Signed)
Deanna Burke is an 62 y.o. female.    Chief Complaint: Pre-Op Rt Nephrectomy for Atrophic Kidney  HPI:   1 - Recurrent Urinary Tract Infections - Pt with many years of occasional simple cystitis, but now more recently with epidose of severe rt pyelonephrotis. Know Rt atrophic / hydronephrotic kidney likley nidus. Most recent positive UCX's with proteus sens fluoroqinolones and bactrim. Her most recent UCX negative.  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 Now with episode of rt pyenephrosis / pyelonephritis requiring neph tube drainage 05/2012. Renogram 06/2012 confirms 5% rt relative function. Baseline Cr 06/2012 1.2 range.   Prior CT suggests 1 early-branching artery 2 vein rt renovascular anatomy. Recent repeat CT confirms as well as sig decrease in hydro and stranding with neph tube.  PMH sig for obesity, colectomy with end iliosomy for Chron's (no immunosupression), ex-lap / adhesiolysis x2, pSBO x2, NO CV disease, No blood thinners  Today Deanna Burke is seen to proceed with rt nephrectomy. No interval fevers.  Past Medical History  Diagnosis Date  . Ulcerative colitis [phone]  . Pulmonary embolus 1971  . Phlebitis 1971  . Small bowel obstruction [id number]  . Small bowel perforation 1971/1996  . Large bowel perforation 1971/1996  . Scoliosis   . Torn rotator cuff 2005  . History of bone density study 2012  . Fatigue   . Cataract     Left Eye  . Hot flashes   . 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 Hypertension   . Cancer     DCIS L breast  . H/O ulcerative colitis   . Breast cancer   . Arthritis     hands, lumbar spine, hips, right ankle  . S/P ileostomy   . S/P radiation therapy 07/24/11 - 09/06/11    Left Breast/ 5000 cGy in 25 Fractions with Boost of 1000 cGy in 5 Fractions  . 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 Abnormal finding on cardiovascular stress test, ischemia anterolateral 02/13/2012    follow heart cath - pt told one tiny blockage that didn't even matter  . Syncope, ? anginal equivilant 02/13/2012  . Ileostomy in place, secondary to ulcerative colitis x 20 years 02/13/2012  . History of recurrent UTIs 02/13/2012  . Sleep apnea 2009    uses cpap-setting is 1  . H/O nephrostomy 08/13/12    PT HAS RIGHT NEPHROSTOMY TUBE TO DRAINAGE BAG AT PRESENT TIME    Past Surgical History  Procedure Laterality Date  . Colectomy  1973  . Ileostomy  1973  . Vaginoplasty  1975  . Exploratory laparotomy  1978/1990    with lysis of adhesions  . Eye surgery  05/08/2011    left cataract removal   . Appendectomy    . Rotator cuff repair  2006    Right  . Breast surgery  05/03/11    LEFT BREAST NEEDLE CORE BIOPSY- DCIS  . 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  ER:+, PR+,   . Abdominal hysterectomy  1996    partial  . Exploratory laps      several due to abd pain related to ulcerative colitis  . Cardiac catheterization  Family History  Problem Relation Age of Onset  . Breast cancer Maternal Grandmother   . Stomach cancer Maternal Grandfather   . Cancer Paternal Grandmother     stomach  . Cancer Other     Breast  . Atrial fibrillation Mother   . Heart attack Father   . Atrial fibrillation Brother   . Heart attack Paternal Uncle    Social History:  reports that she has never smoked. She has never used smokeless tobacco. She reports that she does not drink alcohol or use illicit drugs.  Allergies:  Allergies  Allergen Reactions  . Demerol Nausea And Vomiting  . Dilaudid (Hydromorphone Hcl) Nausea And Vomiting and Other (See Comments)    Reaction: muscle flexion and cramping also with nausea and vomiting  . Stadol (Butorphanol  Tartrate) Nausea And Vomiting  . Amoxicillin Rash  . Cephalexin Rash  . Penicillins Rash    Medications Prior to Admission  Medication Sig Dispense Refill  . Bismuth Subgallate (DEVROM) 200 MG CHEW Chew 400 mg by mouth 4 (four) times daily. For odor control - pt has ileostomy      . Calcium Carbonate-Vitamin D (CALCIUM 600 + D PO) Take 1 tablet by mouth 2 (two) times daily.       . ciprofloxacin (CIPRO) 500 MG tablet Take 500 mg by mouth 2 (two) times daily. Started Sunday 08/11/12 - will finish today  08/13/12      . tamoxifen (NOLVADEX) 20 MG tablet Take 20 mg by mouth every morning.      . triamterene-hydrochlorothiazide (MAXZIDE-25) 37.5-25 MG per tablet Take 1 tablet by mouth every morning.      . aspirin EC 81 MG tablet Take 81 mg by mouth daily.      . diclofenac sodium (VOLTAREN) 1 % GEL Apply 2 g topically 4 (four) times daily as needed (for pain). For shoulder/ joint pain      . fish oil-omega-3 fatty acids 1000 MG capsule Take 1 g by mouth every morning.       . Multiple Vitamins-Minerals (MULTIVITAMIN WITH MINERALS) tablet Take 1 tablet by mouth daily.      . sulfamethoxazole-trimethoprim (BACTRIM DS,SEPTRA DS) 800-160 MG per tablet Take 1 tablet by mouth at bedtime.        Results for orders placed during the hospital encounter of 08/13/12 (from the past 48 hour(s))  SURGICAL PCR SCREEN     Status: None   Collection Time    08/13/12  8:10 AM      Result Value Range   MRSA, PCR NEGATIVE  NEGATIVE   Staphylococcus aureus NEGATIVE  NEGATIVE   Comment:            The Xpert SA Assay (FDA     approved for NASAL specimens     in patients over 21 years of age),     is one component of     a comprehensive surveillance     program.  Test performance has     been validated by [facility] for patients greater     than or equal to 1 year old.     It is not intended     to diagnose infection nor to     guide or monitor treatment.  CBC     Status: None   Collection Time     04 /22/14  8:45 AM      Result Value Range   WBC 4.6  4.0 - 10.5  K/uL   RBC 4.16  3.87 - 5.11 MIL/uL   Hemoglobin 12.5  12.0 - 15.0 g/dL   HCT 37.4  36.0 - 46.0 %   MCV 89.9  78.0 - 100.0 fL   MCH 30.0  26.0 - 34.0 pg   MCHC 33.4  30.0 - 36.0 g/dL   RDW 12.7  11.5 - 15.5 %   Platelets 251  150 - 400 K/uL  BASIC METABOLIC PANEL     Status: Abnormal   Collection Time    08/13/12  8:45 AM      Result Value Range   Sodium 136  135 - 145 mEq/L   Potassium 3.7  3.5 - 5.1 mEq/L   Chloride 98  96 - 112 mEq/L   CO2 29  19 - 32 mEq/L   Glucose, Bld 96  70 - 99 mg/dL   BUN 31 (*) 6 - 23 mg/dL   Creatinine, Ser 1.48 (*) 0.50 - 1.10 mg/dL   Calcium 10.3  8.4 - 10.5 mg/dL   GFR calc non Af Amer 37 (*) >90 mL/min   GFR calc Af Amer 42 (*) >90 mL/min   Comment:            The eGFR has been calculated     using the CKD EPI equation.     This calculation has not been     validated in all clinical     situations.     eGFR's persistently     <90 mL/min signify     possible Chronic Kidney Disease.  TYPE AND SCREEN     Status: None   Collection Time    08/13/12  8:45 AM      Result Value Range   ABO/RH(D) O POS     Antibody Screen NEG     Sample Expiration 08/27/2012    ABO/RH     Status: None   Collection Time    08/13/12  8:45 AM      Result Value Range   ABO/RH(D) O POS     No results found.  Review of Systems  Constitutional: Negative.  Negative for fever and chills.  HENT: Negative.   Eyes: Negative.   Respiratory: Negative.   Cardiovascular: Negative.   Gastrointestinal: Negative.   Genitourinary: Negative.   Musculoskeletal: Negative.   Skin: Negative.   Neurological: Negative.   Endo/Heme/Allergies: Negative.   Psychiatric/Behavioral: Negative.     Blood pressure 117/57, pulse 88, temperature 97.9 F (36.6 C), temperature source Oral, resp. rate 18, SpO2 99.00%. Physical Exam  Constitutional: She is oriented to person, place, and time. She appears well-developed  and well-nourished.  HENT:  Head: Normocephalic and atraumatic.  Eyes: EOM are normal. Pupils are equal, round, and reactive to light.  Neck: Normal range of motion. Neck supple.  Cardiovascular: Normal rate.   Respiratory: Effort normal and breath sounds normal.  GI: Soft.  Rt sided iliosotomy patent  Genitourinary:  Rt neph tube c/d/i with clear urine  Musculoskeletal: Normal range of motion.  Neurological: She is alert and oriented to person, place, and time.  Skin: Skin is warm and dry.  Psychiatric: She has a normal mood and affect. Her behavior is normal. Judgment and thought content normal.     Assessment/Plan   1 - Recurrent Urinary Tract Infections - Rt atrophic / obstructed kidney likely nidus for now severe infections which are resolved at present.  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 Complex case in that severe recent infection, complex Rt renovascular anatomy / large kidney (UPJ in true pelvis), has Rt neph tube, and prior Colectomy. Will attempt lap / retroperitoneal approach with goal of hilum control and as much renal disseciton as possible, but certainly relaitvely high risk of open conversion as well as bowel complicaitons.  We re-discussed the role of  nephrectomy with the overall goal of complete surgical excision. We specifically re-discussed that with removal of the kidney there would be an overall renal function decline with attendant risks of renal failure and need for dialysis in some cases, and need for kidney-friendly lifestyle post-op with excellent blood pressure and glycemic control. We thenre- discussed surgical approaches including robotic/lap and open techniques with robotic associated with a shorter convalescence. I showed the patient on their abdomen the approximately 4-6 incision (trocar) sites as well as presumed extraction sites with robotic/lap approach as well as possible open incision sites. We  specifically addressed that there may be need to alter operative plans according to intraopertive findings including conversion to open procedure. We re-discussed specific peri-operative risks including bleeding, infection, deep vein thrombosis, pulmonary embolism, compartment syndrome, nuropathy / neuropraxia, heart attack, stroke, death, as well as long-term risks such as non-cure / need for additional therapy and need for imaging and lab based post-op surveillance protocols. We discussed typical hospital course of approximately 2 day hospitalization, need for peri-operative drains / catheters, and typical post-hospital course with return to most non-strenuous activities by 2 weeks and ability to return to most jobs and more strenuous activity such as exercise by 6 weeks.   After this lengthy and detail discussion, including answering all of the patient's questions to their satisfaction, they have chosen to proceed with retroperitoneal rt nephrectomy today as scheduled.    Zuma Hust 08/14/2012, 6:31 AM

## 2012-08-14 NOTE — Anesthesia Preprocedure Evaluation (Signed)
Anesthesia Evaluation  Patient identified by MRN, date of birth, ID band Patient awake    Reviewed: Allergy & Precautions, H&P , NPO status , Patient's Chart, lab work & pertinent test results  Airway Mallampati: III TM Distance: <3 FB Neck ROM: Full    Dental no notable dental hx.    Pulmonary sleep apnea , PE breath sounds clear to auscultation  Pulmonary exam normal       Cardiovascular hypertension, Pt. on medications Rhythm:Regular Rate:Normal     Neuro/Psych negative neurological ROS  negative psych ROS   GI/Hepatic negative GI ROS, Neg liver ROS,   Endo/Other  negative endocrine ROS  Renal/GU negative Renal ROS  negative genitourinary   Musculoskeletal negative musculoskeletal ROS (+)   Abdominal   Peds negative pediatric ROS (+)  Hematology negative hematology ROS (+)   Anesthesia Other Findings   Reproductive/Obstetrics negative OB ROS                           Anesthesia Physical Anesthesia Plan  ASA: III  Anesthesia Plan: General   Post-op Pain Management:    Induction: Intravenous  Airway Management Planned: Oral ETT  Additional Equipment: Arterial line  Intra-op Plan:   Post-operative Plan: Extubation in OR  Informed Consent: I have reviewed the patients History and Physical, chart, labs and discussed the procedure including the risks, benefits and alternatives for the proposed anesthesia with the patient or authorized representative who has indicated his/her understanding and acceptance.   Dental advisory given  Plan Discussed with: CRNA and Surgeon  Anesthesia Plan Comments:         Anesthesia Quick Evaluation

## 2012-08-14 NOTE — Progress Notes (Signed)
Patient reluctant to ambulate at this time due to "waves of nausea". This RN informs patient we will try again to ambulate sometime during night or early morning in am. Patient agreeable to this. Deanna Burke

## 2012-08-14 NOTE — Anesthesia Procedure Notes (Signed)
Procedure Name: Intubation Date/Time: 08/14/2012 8:21 AM Performed by: Anne Fu Pre-anesthesia Checklist: Patient identified, Emergency Drugs available, Suction available, Patient being monitored and Timeout performed Patient Re-evaluated:Patient Re-evaluated prior to inductionOxygen Delivery Method: Circle system utilized Preoxygenation: Pre-oxygenation with 100% oxygen Intubation Type: IV induction Ventilation: Mask ventilation without difficulty Laryngoscope Size: Miller and 3 Grade View: Grade I Tube type: Oral Tube size: 7.0 mm Number of attempts: 1 Airway Equipment and Method: Stylet Placement Confirmation: ETT inserted through vocal cords under direct vision,  positive ETCO2 and breath sounds checked- equal and bilateral Secured at: 22 cm Tube secured with: Tape Dental Injury: Teeth and Oropharynx as per pre-operative assessment

## 2012-08-15 ENCOUNTER — Encounter (HOSPITAL_COMMUNITY): Payer: Self-pay | Admitting: Urology

## 2012-08-15 LAB — HEMOGLOBIN AND HEMATOCRIT, BLOOD: Hemoglobin: 9.8 g/dL — ABNORMAL LOW (ref 12.0–15.0)

## 2012-08-15 LAB — BASIC METABOLIC PANEL
BUN: 17 mg/dL (ref 6–23)
Creatinine, Ser: 1.05 mg/dL (ref 0.50–1.10)
GFR calc Af Amer: 64 mL/min — ABNORMAL LOW (ref >90)
GFR calc non Af Amer: 55 mL/min — ABNORMAL LOW (ref >90)
Potassium: 3.3 mEq/L — ABNORMAL LOW (ref 3.5–5.1)

## 2012-08-15 MED ORDER — NAPHAZOLINE HCL 0.1 % OP SOLN
1.0000 [drp] | Freq: Four times a day (QID) | OPHTHALMIC | Status: DC | PRN
  Administered 2012-08-15: 1 [drp] via OPHTHALMIC
  Filled 2012-08-15: qty 15

## 2012-08-15 MED ORDER — POTASSIUM CHLORIDE CRYS ER 20 MEQ PO TBCR
40.0000 meq | EXTENDED_RELEASE_TABLET | Freq: Two times a day (BID) | ORAL | Status: AC
  Administered 2012-08-15 (×2): 40 meq via ORAL
  Filled 2012-08-15 (×2): qty 2

## 2012-08-15 NOTE — Progress Notes (Signed)
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  2 - Rt Eye Irritation - pt with complaints of rt eye "scratchiness" this AM. No vision changes. No piror episodes. This sensation has improved significantly overnight.  Today Deanna Burke's chief complaint is of rt eye irritation. Minimal surgical pain. Ambulated yesterday. Having some ileostomy output and tolerating some clears.  Objective: Vital signs in last 24 hours: Temp:  [97.4 F (36.3 C)-98.6 F (37 C)] 98.6 F (37 C) (04/24 0540) Pulse Rate:  [76-93] 90 (04/24 0540) Resp:  [13-20] 16 (04/24 0540) BP: (98-126)/(47-75) 98/49 mmHg (04/24 0540) SpO2:  [98 %-100 %] 100 % (04/24 0540) Arterial Line BP: (137-145)/(59-64) 145/64 mmHg (04/23 1415) Weight:  [77.11 kg (170 lb)] 77.11 kg (170 lb) (04/23 1538) Last BM Date: 08/13/12  Intake/Output from previous day: 04/23 0701 - 04/24 0700 In: 3806.3 [P.O.:240; I.V.:2866.3; IV Piggyback:700] Out: 1705 [Urine:1405; Blood:300] Intake/Output this shift:    General appearance: alert, cooperative and appears stated age Head: Normocephalic, without obvious abnormality, atraumatic Eyes: conjunctivae/corneas clear. PERRL, EOM's intact. Fundi benign., No obvious large abrasions to rt eye,  Ears: normal TM's and external ear canals both ears Nose: Nares normal. Septum midline. Mucosa normal. No drainage or sinus tenderness. Throat: lips, mucosa, and tongue normal; teeth and gums normal Neck: no adenopathy, no carotid bruit, no JVD, supple, symmetrical, trachea midline and thyroid not enlarged, symmetric, no tenderness/mass/nodules Back: symmetric, no curvature. ROM normal. No CVA tenderness. Resp: clear to auscultation bilaterally Cardio: regular rate and rhythm, S1, S2 normal, no murmur,  click, rub or gallop GI: soft, non-tender; bowel sounds normal; no masses,  no organomegaly Pelvic: external genitalia normal and Folely c/d/i with light pink urine. Extremities: extremities normal, atraumatic, no cyanosis or edema Pulses: 2+ and symmetric Skin: Skin color, texture, turgor normal. No rashes or lesions Lymph nodes: Cervical, supraclavicular, and axillary nodes normal. Neurologic: Grossly normal Incision/Wound: Mild Rt flank skin bruising w/o palpable hematoma. C/d/i.  Lab Results:   Recent Labs  08/13/12 0845  08/14/12 1343 08/15/12 0534  WBC 4.6  --   --   --   HGB 12.5  < > 11.6* 9.8*  HCT 37.4  < > 33.6* 28.5*  PLT 251  --   --   --   < > = values in this interval not displayed. BMET  Recent Labs  08/14/12 1005 08/14/12 1016 08/15/12 0534  NA 136 136 137  K 2.7* 2.8* 3.3*  CL 101  --  100  CO2 26  --  28  GLUCOSE 174*  --  144*  BUN 19  --  17  CREATININE 0.98  --  1.05  CALCIUM 8.5  --  8.3*   PT/INR No results found for this basename: LABPROT, INR,  in the last 72 hours ABG  Recent Labs  08/14/12 0956 08/14/12 1016  PHART 7.328* 7.325*  HCO3 26.0* 26.2*    Studies/Results: No results found.  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 Ambulate, advance diet, DC foley. Replace K and obtain AM hgb and BMP tomorrow. Hgb acceptable today.  2 - Rt  Eye Irritation - No large abrasion by exam, but small abrasion certainly possible. NO vision changes. RX lubricant eye gtt, if not improving substantially, will obtain optho eval.  Remain in house.  [facility], Deanna Burke 08/15/2012

## 2012-08-15 NOTE — Op Note (Signed)
NAMESARH, KIRSCHENBAUM NO.:  [id number]  MEDICAL RECORD NO.:  [id number]  LOCATION:  41                         FACILITY:  [facility]  PHYSICIAN:  Alexis Frock, MD     DATE OF BIRTH:  06/20/1950  DATE OF PROCEDURE:08/14/12 DATE OF DISCHARGE:                              OPERATIVE REPORT   PREOPERATIVE DIAGNOSES:  Right atrophic kidney, history of UPJ obstruction, and pyelonephritis with urosepsis.  PROCEDURE:  Retroperitoneal right laparoscopic nephrectomy.  ASSISTANT:  Leta Baptist, PA  ESTIMATED BLOOD LOSS:  300 mL.  FINDINGS: 1. Two-artery two-vein right renovascular anatomy. 2. Dense adhesions surrounding the kidney.  SPECIMENS:  Right kidney, adrenal spared.  COMPLICATIONS:  None.  INDICATION:  Deanna Burke is a very pleasant 62 year old lady with complex medical history including severe Crohn disease status post total colectomy with end ileostomy.  She has also had multiple episodes of bowel obstruction and abdominal exploration.  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  However, this had been non problematic until late last year when she began recurrent urinary infections culminating in an episode of urosepsis and pyelonephritis.  She did have nephrostomy tube drainage of the right kidney.  After she was appropriately treated, nuclear medicine venogram was performed which revealed only 5% function of the right kidney; however, the kidney was still making significant amount of urine and was clearly more reliability then asset.  Options for management discussed including chronic nephrostomy or stent versus nephrectomy.  She wished to proceed with the latter.  Given her multiple intra-abdominal surgeries, it was felt that the retroperitoneal approach would be preferred.  Informed consent was obtained and placed in medical record.  PROCEDURE IN DETAIL:  The patient being Deanna Burke, procedure being right  retroperitoneoscopic nephrectomy was confirmed.  Procedure was carried out.  Time-out was performed.  Intravenous antibiotics were administered.  General endotracheal anesthesia was introduced.  The patient was placed to right side up with full flank position and flank 15 degrees stable flexion, superior arm elevator, axillary roll.  Bottom leg was bent.  Top leg was straight.  Sequential pressure devices were applied.  Patient's previous nephrostomy tube was capped and prepped and draped within the sterile field of her right flank and abdomen.  Her ileostomy site was prepped out of the field.  Next, incision was made 1 fingerbreadth medial to her previous nephrostomy site as this was known to be a good window towards the area of the peritoneum that was away from her liver which was notably very, very low on CT scan.  Using finger dissection, the external and internal fascia were entered as was the retroperitoneum.  The retroperitoneal space was partially developed using surgeon's finger.  Next, the space maker balloon dilation apparatus was carefully positioned into the retroperitoneum behind the kidney and inflated under laparoscopic continuous vision.  It was inflated to 30 pumps.  This was taken down and 12 mm laparoscopic port was placed both medial and lateral to this previous incision by 1 handbreadth using direct vision and digital guidance. Retroperitoneoscopic vision revealed excellent development of retroperitoneum.  Psoas muscle was identified.  Attention was directed to identification of the  ureter.  Careful dissection was performed just medial to the psoas muscle  to find the ureter.  This was not identified at this level.  However, the inferior vena cava was identified and traced supeirorly towards the area of the right renal hilum.  The right renal hilum consisted of early branching renal artery and 2 separate renal veins.  The artery was controlled first with extra large  clip followed by vascular load distal to this.  Each vein was then taken using vascular load stapler.  There was excellent hemostatic control of the hilum.  Additional superior attachments were taken down using Harmonic Scalpel, sparing the adrenal gland. The previous nephrostomy tube was removed, inspected and discarded. The anterior attachments were taken down in the inferior aspect of the kidney and there was significant amount of retroperitoneal fat and inflammation and fibrosis.  The ureter was unable to be positively identified.  The kidney had been previously completely mobilized so that ligation of ureter was easily performed during specimen extraction.  As such, the previous medial most port site and camera port site were connected by incision at the level of the skin.  The fascia was carefully spread for distance of  6 cm and specimens retrieved.  Ureter was encountered, doubly clipped, and ligated.  This completely freed up the right nephrectomy specimen which was set aside for permanent pathology.  Hemostasis appeared excellent. Two half rolls of Surgicel were placed in the area of the hilum. Sponge and needle counts were correct.  Fascia was reapproximated using figure-of-eight interrupted PDS at the level of extraction site followed by reapproximation of Scarpa's using running Vicryl.  The remaining port site was closed using deep dermal figure-of-eight 2-0 Vicryl.  All skin incisions were reapproximated using subcuticular Monocryl followed by Dermabond.  Procedure was then terminated.  The patient tolerated the procedure well.  There were no immediate procedural complications.  The patient was taken to the postanesthesia care unit in stable condition.          ______________________________ Alexis Frock, MD     TM/MEDQ  D:  08/14/2012  T:  08/15/2012  Job:  [id number]

## 2012-08-16 ENCOUNTER — Other Ambulatory Visit: Payer: Self-pay | Admitting: Ophthalmology

## 2012-08-16 LAB — BASIC METABOLIC PANEL
CO2: 30 mEq/L (ref 19–32)
Calcium: 8.5 mg/dL (ref 8.4–10.5)
Creatinine, Ser: 1.1 mg/dL (ref 0.50–1.10)
Glucose, Bld: 120 mg/dL — ABNORMAL HIGH (ref 70–99)
Sodium: 136 mEq/L (ref 135–145)

## 2012-08-16 LAB — HEMOGLOBIN AND HEMATOCRIT, BLOOD: HCT: 28.6 % — ABNORMAL LOW (ref 36.0–46.0)

## 2012-08-16 MED ORDER — CIPROFLOXACIN HCL 0.3 % OP SOLN
2.0000 [drp] | OPHTHALMIC | Status: DC
  Administered 2012-08-16 – 2012-08-17 (×5): 2 [drp] via OPHTHALMIC
  Filled 2012-08-16: qty 2.5

## 2012-08-16 MED ORDER — TOBRAMYCIN-DEXAMETHASONE 0.3-0.1 % OP SUSP: 1.0000 [drp] | Freq: Four times a day (QID) | OPHTHALMIC | Status: DC

## 2012-08-16 NOTE — Progress Notes (Signed)
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   2 - Rt Eye Irritation - pt with complaints of rt eye "scratchiness" starting AM 4/24. No vision changes. No piror episodes. This sensation improved initially but has now worsened and now associated with increasing erythema and crusty exudate.  Today Deanna Burke's chief complaint is of rt eye irritation. Minimal surgical pain. Ambulating and iliostomy with output.   Objective: Vital signs in last 24 hours: Temp:  [98.6 F (37 C)-99.9 F (37.7 C)] 99.9 F (37.7 C) (04/25 0503) Pulse Rate:  [88-103] 99 (04/25 0503) Resp:  [18] 18 (04/25 0503) BP: (104-114)/(34-43) 108/39 mmHg (04/25 0503) SpO2:  [96 %-99 %] 96 % (04/25 0503) Last BM Date: 08/15/12  Intake/Output from previous day: 04/24 0701 - 04/25 0700 In: 2227 [P.O.:1102; I.V.:1125] Out: 1725 [Urine:1725] Intake/Output this shift:    General appearance: alert, cooperative and appears stated age Head: Normocephalic, without obvious abnormality, atraumatic Eyes: Rt eye with increased eryethma and crusty exudate of lashes. lt eye nl. NO vision changes. PERRL Ears: normal TM's and external ear canals both ears Nose: Nares normal. Septum midline. Mucosa normal. No drainage or sinus tenderness. Throat: lips, mucosa, and tongue normal; teeth and gums normal Neck: no adenopathy, no carotid bruit, no JVD, supple, symmetrical, trachea midline and thyroid not enlarged, symmetric, no tenderness/mass/nodules Back: symmetric, no curvature. ROM normal. No CVA tenderness. Resp: clear to auscultation bilaterally Cardio: regular rate and rhythm, S1, S2 normal, no murmur, click, rub or gallop GI: soft, non-tender; bowel sounds normal; no masses,  no organomegaly Extremities: extremities normal,  atraumatic, no cyanosis or edema Pulses: 2+ and symmetric Skin: Skin color, texture, turgor normal. No rashes or lesions Lymph nodes: Cervical, supraclavicular, and axillary nodes normal. Neurologic: Grossly normal Incision/Wound: Rt flank with stable skin bruising. NO hematoma. C/d/i. Iliosomy with enteric contents.  Lab Results:   Recent Labs  08/13/12 0845  08/15/12 0534 08/16/12 0510  WBC 4.6  --   --   --   HGB 12.5  < > 9.8* 9.5*  HCT 37.4  < > 28.5* 28.6*  PLT 251  --   --   --   < > = values in this interval not displayed. BMET  Recent Labs  08/15/12 0534 08/16/12 0510  NA 137 136  K 3.3* 4.1  CL 100 101  CO2 28 30  GLUCOSE 144* 120*  BUN 17 13  CREATININE 1.05 1.10  CALCIUM 8.3* 8.5   PT/INR No results found for this basename: LABPROT, INR,  in the last 72 hours ABG  Recent Labs  08/14/12 0956 08/14/12 1016  PHART 7.328* 7.325*  HCO3 26.0* 26.2*    Studies/Results: No results found.  Anti-infectives: Anti-infectives   Start     Dose/Rate Route Frequency Ordered Stop   08/14/12 1600  ciprofloxacin (CIPRO) IVPB 400 mg     400 mg 200 mL/hr over 60 Minutes Intravenous Every 12 hours 08/14/12 1535 08/15/12 0552   08/14/12 0609  vancomycin (VANCOCIN) IVPB 1000 mg/200 mL premix     1,000 mg 200 mL/hr over 60 Minutes Intravenous 60 min pre-op 08/14/12 0609 08/14/12 0850   08/14/12 0000  gentamicin (GARAMYCIN) 400 mg in dextrose 5 % 50 mL IVPB     400 mg 120 mL/hr over 30 Minutes Intravenous 30 min  pre-op 08/13/12 1911 08/14/12 0808      Assessment/Plan:  1 - Rt Renal Atrophy / Rt UPJ Obstruction - doing well POD 2. Hgb stable. Continue ambulation. Saline lock IV.  2 - Rt Eye Irritation - Worsened symptomatically and clinically and worrisome for conjunctivitis +/- abrasion. Pt with significant bother, moreso than surgical issues. Will obtain optho consult and begin Cipro opthalmic in mean time.    [facility], Deanna Burke 08/16/2012

## 2012-08-16 NOTE — Consult Note (Signed)
  Patient had surgery  for kidney removal and had red irritated eyes after surgery 3 days ago. The left eye redness cleared shortly afterward but the right has continued to be red and irritated. The redness and irritation feels like sand.  Assoc with photophobia   Patient has been brought from her room and is being seen in the ED with their slit lamp.  VA: patient says that her vision has not changed and is normal. 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  Continue the antibiotic drop (ciloxan) 4 times per day in the right eye Start Tobradex ophth drops 4 times per day right eye (shake drops each time) Artificial Tears 4 times per day Left eye It may take 1 - 2 weeks to control the Filamentary keratitis so patient should be continued on the Tobradex after she leaves the hospital The eye should begin to get better within a few days and if not then she can call to make an appt at the office early next week.  If she is still in the hospital and not getting better,  please call me for follow-up   Abel Presto MD

## 2012-08-17 MED ORDER — TOBRAMYCIN-DEXAMETHASONE 0.3-0.1 % OP OINT
TOPICAL_OINTMENT | Freq: Four times a day (QID) | OPHTHALMIC | Status: DC
  Administered 2012-08-17 – 2012-08-18 (×5): via OPHTHALMIC
  Filled 2012-08-17: qty 3.5

## 2012-08-17 NOTE — Progress Notes (Signed)
Patient ID: Deanna Burke, female   DOB: 1950-08-22, 62 y.o.   MRN: [id number]  3 Days Post-Op Subjective: Pt doing well.  Her right eye pain is improving.  Seen by Dr. Nehemiah Massed yesterday and recommended Tobradex to right eye and to call him if her symptoms are not improved after a few days.  Objective: Vital signs in last 24 hours: Temp:  [98.5 F (36.9 C)-98.9 F (37.2 C)] 98.6 F (37 C) (04/26 0517) Pulse Rate:  [96-102] 96 (04/26 0517) Resp:  [16-18] 16 (04/26 0517) BP: (105-124)/(51-58) 116/51 mmHg (04/26 0517) SpO2:  [98 %] 98 % (04/26 0517)  Intake/Output from previous day: 04/25 0701 - 04/26 0700 In: 360 [P.O.:360] Out: 2250 [Urine:2250] Intake/Output this shift:    Physical Exam:  General: Alert and oriented CV: RRR Lungs: Clear Abdomen: Soft, ND Incisions: Her right flank incision is clean dry and intact Ext: NT, No erythema  Lab Results:  Recent Labs  08/14/12 1343 08/15/12 0534 08/16/12 0510  HGB 11.6* 9.8* 9.5*  HCT 33.6* 28.5* 28.6*   BMET  Recent Labs  08/15/12 0534 08/16/12 0510  NA 137 136  K 3.3* 4.1  CL 100 101  CO2 28 30  GLUCOSE 144* 120*  BUN 17 13  CREATININE 1.05 1.10  CALCIUM 8.3* 8.5     Studies/Results: No results found.  Assessment/Plan:  She is hesitant to go home today as she does live alone. She clinically appears ready but is adamant that she cannot manage her ADLs by herself.  She will be reassessed later today and may need to stay until tomorrow.  If she still feels unable to go home by tomorrow, may need to consider SNF. Will make sure she is receiving her Tobradex eye drops in the hospital.    LOS: 3 days   Anatole Apollo,LES 08/17/2012, 8:05 AM

## 2012-08-18 MED ORDER — OXYCODONE HCL 5 MG PO TABS: 5.0000 mg | ORAL_TABLET | ORAL | Status: DC | PRN

## 2012-08-18 MED ORDER — DSS 100 MG PO CAPS: 100.0000 mg | ORAL_CAPSULE | Freq: Two times a day (BID) | ORAL | Status: DC

## 2012-08-18 NOTE — Discharge Summary (Signed)
  Date of admission: 08/14/2012  Date of discharge: 08/18/2012  Admission diagnosis: Chronic pyelonpephritis, right hydronephrosis with poorly functioning right kidney.  Discharge diagnosis: As above  History and Physical: For full details, please see admission history and physical. Briefly, Deanna Burke is a 62 y.o. year old patient with recurrent UTIs and hydronephrosis and poor function of her right kidney.   Hospital Course: She underwent a right nephrectomy on 08/15/12. Her diet was gradually advanced and she was transitioned to oral pain medication and began ambulating.  She had severe irritation of her right eye and was seen by Dr. Nehemiah Massed with Opthalmology who diagnosed her with filametary keratitis and prescribed Tobradex.  Her eye pain significantly improved and she was ready for discharge home on POD # 3.  Laboratory values:  Recent Labs  08/16/12 0510  HGB 9.5*  HCT 28.6*    Recent Labs  08/16/12 0510  CREATININE 1.10    Disposition: Home  Discharge instruction: The patient was instructed to be ambulatory but told to refrain from heavy lifting, strenuous activity, or driving.   Discharge medications:    Medication List    STOP taking these medications       aspirin EC 81 MG tablet     CALCIUM 600 + D PO     ciprofloxacin 500 MG tablet  Commonly known as:  CIPRO      TAKE these medications       DEVROM 200 MG Chew  Generic drug:  Bismuth Subgallate  Chew 400 mg by mouth 4 (four) times daily. For odor control - pt has ileostomy     diclofenac sodium 1 % Gel  Commonly known as:  VOLTAREN  Apply 2 g topically 4 (four) times daily as needed (for pain). For shoulder/ joint pain     DSS 100 MG Caps  Take 100 mg by mouth 2 (two) times daily.     fish oil-omega-3 fatty acids 1000 MG capsule  Take 1 g by mouth every morning.     multivitamin with minerals tablet  Take 1 tablet by mouth daily.     oxyCODONE 5 MG immediate release tablet  Commonly  known as:  Oxy IR/ROXICODONE  Take 1 tablet (5 mg total) by mouth every 4 (four) hours as needed.     sulfamethoxazole-trimethoprim 800-160 MG per tablet  Commonly known as:  BACTRIM DS,SEPTRA DS  Take 1 tablet by mouth at bedtime.     tamoxifen 20 MG tablet  Commonly known as:  NOLVADEX  Take 20 mg by mouth every morning.     tobramycin-dexamethasone ophthalmic solution  Commonly known as:  TOBRADEX  Place 1 drop into the right eye 4 (four) times daily.     triamterene-hydrochlorothiazide 37.5-25 MG per tablet  Commonly known as:  MAXZIDE-25  Take 1 tablet by mouth every morning.        Followup:      Follow-up Information   Follow up with Alexis Frock, MD On 08/29/2012. (at 11:45)    Contact information:   [address] [place] [address] [phone]

## 2012-09-11 ENCOUNTER — Other Ambulatory Visit: Payer: Self-pay | Admitting: Physician Assistant

## 2012-09-11 DIAGNOSIS — C50419 Malignant neoplasm of upper-outer quadrant of unspecified female breast: Secondary | ICD-10-CM

## 2012-09-12 ENCOUNTER — Ambulatory Visit (HOSPITAL_BASED_OUTPATIENT_CLINIC_OR_DEPARTMENT_OTHER): Payer: BC Managed Care – PPO | Admitting: Physician Assistant

## 2012-09-12 ENCOUNTER — Other Ambulatory Visit (HOSPITAL_BASED_OUTPATIENT_CLINIC_OR_DEPARTMENT_OTHER): Payer: BC Managed Care – PPO | Admitting: Lab

## 2012-09-12 ENCOUNTER — Telehealth: Payer: Self-pay | Admitting: *Deleted

## 2012-09-12 ENCOUNTER — Encounter: Payer: Self-pay | Admitting: Physician Assistant

## 2012-09-12 VITALS — BP 108/68 | HR 99 | Temp 98.2°F | Resp 20 | Ht 64.0 in | Wt 159.8 lb

## 2012-09-12 DIAGNOSIS — Z170 Estrogen receptor positive status [ER+]: Secondary | ICD-10-CM

## 2012-09-12 DIAGNOSIS — C50412 Malignant neoplasm of upper-outer quadrant of left female breast: Secondary | ICD-10-CM

## 2012-09-12 DIAGNOSIS — Z853 Personal history of malignant neoplasm of breast: Secondary | ICD-10-CM

## 2012-09-12 DIAGNOSIS — D0590 Unspecified type of carcinoma in situ of unspecified breast: Secondary | ICD-10-CM

## 2012-09-12 DIAGNOSIS — C50419 Malignant neoplasm of upper-outer quadrant of unspecified female breast: Secondary | ICD-10-CM

## 2012-09-12 LAB — CBC WITH DIFFERENTIAL/PLATELET
Basophils Absolute: 0 10*3/uL (ref 0.0–0.1)
EOS%: 2.9 % (ref 0.0–7.0)
HCT: 34.7 % — ABNORMAL LOW (ref 34.8–46.6)
HGB: 11.8 g/dL (ref 11.6–15.9)
MCH: 29.8 pg (ref 25.1–34.0)
MONO#: 0.3 10*3/uL (ref 0.1–0.9)
NEUT%: 68.7 % (ref 38.4–76.8)
lymph#: 0.9 10*3/uL (ref 0.9–3.3)

## 2012-09-12 LAB — COMPREHENSIVE METABOLIC PANEL (CC13)
BUN: 26.2 mg/dL — ABNORMAL HIGH (ref 7.0–26.0)
CO2: 28 mEq/L (ref 22–29)
Calcium: 10.9 mg/dL — ABNORMAL HIGH (ref 8.4–10.4)
Chloride: 103 mEq/L (ref 98–107)
Creatinine: 1.3 mg/dL — ABNORMAL HIGH (ref 0.6–1.1)
Glucose: 97 mg/dl (ref 70–99)

## 2012-09-12 MED ORDER — TAMOXIFEN CITRATE 20 MG PO TABS: 20.0000 mg | ORAL_TABLET | Freq: Every day | ORAL | Status: DC

## 2012-09-12 NOTE — Telephone Encounter (Signed)
appts made and printed...td 

## 2012-09-12 NOTE — Progress Notes (Signed)
ID: Sharyne Richters OB: 23-May-1950  MR#: [id number]  ZLD#:[id number]  PCP: [name], MD GYN:   SU: Coralie Keens, MD OTHER MD: Jamal Maes, MD;  Alexis Frock, MD, Eppie Gibson, MD   HISTORY OF PRESENT ILLNESS: Deanna Burke had routine screening mammography 03/22/2011 at [facility]. This showed new microcalcifications in the left breast. She was recalled for additional views December 14, and these showed the calcifications to be pleomorphic and linear. 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   With this information the patient was referred for bilateral breast MRI, performed 04/27/2011. This showed no suspicious findings on the right. On the left there was 11 mm lobulated mass 5 cm from the prior biopsy clip. 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   Patient underwent left lumpectomy in February 2013 with negative margins. Additional treatment history is detailed below.  INTERVAL HISTORY: Deanna Burke returns today for routine followup of her left breast carcinoma. She continues on tamoxifen which she is tolerating very well. She initially had hot flashes, but these have resolved. She has no vaginal dryness, discharge, or bleeding. She's had no abnormal clotting. She's resulted seen by her ophthalmologist for a "floater" and has a known cataract that is being followed in the right eye.  Interval history is most remarkable for [name] having recently undergone a right nephrectomy under the care of Dr. Tresa Moore in April 2014. She is recovered well from the surgery, with no complications, but still feels tired.  REVIEW OF SYSTEMS: Deanna Burke has had no recent illnesses and denies any fevers or chills. She's had no abnormal bruising or bleeding. She's eating and drinking fairly well denies any nausea or  change in bowel habits. She is urinating with no problems. She currently denies dysuria or hematuria. She's had no new cough, increased shortness of breath, chest pain, palpitations. No abnormal headaches or dizziness, and currently no unusual myalgias, arthralgias, bony pain, or peripheral swelling.  A detailed review of systems is otherwise stable and noncontributory.   PAST MEDICAL HISTORY: Past Medical History  Diagnosis Date  . Ulcerative colitis [phone]  . Pulmonary embolus 1971  . Phlebitis 1971  . Small bowel obstruction [id number]  . Small bowel perforation 1971/1996  . Large bowel perforation 1971/1996  . Scoliosis   . Torn rotator cuff 2005  . History of bone density study 2012  . Fatigue   . Cataract     Left Eye  . Hot flashes   . 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 Hypertension   . Cancer     DCIS L breast  . H/O ulcerative colitis   . Breast cancer   . Arthritis     hands, lumbar spine, hips, right ankle  . S/P ileostomy   . S/P radiation therapy 07/24/11 - 09/06/11    Left Breast/ 5000 cGy in 25 Fractions with Boost of 1000 cGy in 5 Fractions  . 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 Abnormal finding on cardiovascular stress test, ischemia anterolateral 02/13/2012    follow heart cath - pt told one tiny blockage that didn't even matter  . Syncope, ? anginal equivilant 02/13/2012  . Ileostomy in place, secondary to ulcerative colitis x 20 years 02/13/2012  . History of recurrent UTIs 02/13/2012  .  Sleep apnea 2009    uses cpap-setting is 1  . H/O nephrostomy 08/13/12    PT HAS RIGHT NEPHROSTOMY TUBE TO DRAINAGE BAG AT PRESENT TIME    PAST SURGICAL HISTORY: Past Surgical History  Procedure Laterality Date  . Colectomy  1973  . Ileostomy  1973  . Vaginoplasty  1975  . Exploratory laparotomy  1978/1990    with lysis of adhesions  . Eye surgery  05/08/2011    left  cataract removal   . Appendectomy    . Rotator cuff repair  2006    Right  . Breast surgery  05/03/11    LEFT BREAST NEEDLE CORE BIOPSY- DCIS  . 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  ER:+, PR+,   . Abdominal hysterectomy  1996    partial  . Exploratory laps      several due to abd pain related to ulcerative colitis  . Cardiac catheterization    . Laparoscopic nephrectomy Right 08/14/2012    Procedure: RIGHT LAPAROSCOPIC RETROPERITONEAL LAPAROSCOPIC NEPHRECTOMY ;  Surgeon: Alexis Frock, MD;  Location: [facility];  Service: Urology;  Laterality: Right;  RIGHT LAPAROSCOPIC RETROPERITONEAL LAPAROSCOPIC NEPHRECTOMY, POSSIBLE OPEN     FAMILY HISTORY Family History  Problem Relation Age of Onset  . Breast cancer Maternal Grandmother   . Stomach cancer Maternal Grandfather   . Cancer Paternal Grandmother     stomach  . Cancer Other     Breast  . Atrial fibrillation Mother   . Heart attack Father   . Atrial fibrillation Brother   . Heart attack Paternal Uncle   She had one brothers no sisters. There is no history of breast or ovarian cancer in the immediate family.   GYNECOLOGIC HISTORY:  menarche age 39, underwent hysterectomy and bilateral salpingo-oophorectomy in 1996. She took hormone replacement for 16 years, stopping only at the time of this diagnosis. She is GX P0.  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 She lives by herself, with no pets.     ADVANCED DIRECTIVES: Not in place   HEALTH MAINTENANCE: History  Substance Use Topics  . Smoking status: Never Smoker   . Smokeless tobacco: Never Used  . Alcohol Use: No     Colonoscopy: s/p ileostomy for ulcerative colitis  PAP: Nov 2012  Bone density: 2007 - Normal  Lipid panel: UTD  Allergies  Allergen Reactions  . Demerol Nausea And Vomiting  . Dilaudid (Hydromorphone Hcl)  Nausea And Vomiting and Other (See Comments)    Reaction: muscle flexion and cramping also with nausea and vomiting  . Stadol (Butorphanol Tartrate) Nausea And Vomiting  . Amoxicillin Rash  . Cephalexin Rash  . Penicillins Rash    Current Outpatient Prescriptions  Medication Sig Dispense Refill  . aspirin 81 MG tablet Take 81 mg by mouth daily.      . Bismuth Subgallate (DEVROM) 200 MG CHEW Chew 400 mg by mouth 4 (four) times daily. For odor control - pt has ileostomy      . calcium carbonate (OS-CAL) 600 MG TABS Take 600 mg by mouth daily.      . diclofenac sodium (VOLTAREN) 1 % GEL Apply 2 g topically 4 (four) times daily as needed (for pain). For shoulder/ joint pain      . fish oil-omega-3 fatty acids 1000 MG capsule Take 1 g by mouth every morning.       [name]  Multiple Vitamins-Minerals (MULTIVITAMIN WITH MINERALS) tablet Take 1 tablet by mouth daily.      . tamoxifen (NOLVADEX) 20 MG tablet Take 1 tablet (20 mg total) by mouth daily.  90 tablet  3  . triamterene-hydrochlorothiazide (MAXZIDE-25) 37.5-25 MG per tablet Take 1 tablet by mouth every morning.       No current facility-administered medications for this visit.    OBJECTIVE: Middle-aged white female who appears comfortable and is in no acute distress Filed Vitals:   09/12/12 1334  BP: 108/68  Pulse: 99  Temp: 98.2 F (36.8 C)  Resp: 20     Body mass index is 27.42 kg/(m^2).    ECOG FS: 1 Filed Weights   09/12/12 1334  Weight: 159 lb 12.8 oz (72.485 kg)    Sclerae unicteric Oropharynx clear No cervical or supraclavicular adenopathy Lungs clear to auscultation bilaterally, no wheezes, no rales or rhonchi Heart regular rate and rhythm Abdomen soft, nontender to palpation, positive bowel sounds. There is a well-healing incision along the right flank status post recent nephrectomy. MSK no focal spinal tenderness, no peripheral edema Neuro: non-focal, well-oriented, positive affect Breasts:  Right breast is  unremarkable. Left breast is status post lumpectomy, no suspicious nodularities or evidence of recurrence. Axillae are benign bilaterally for palpable adenopathy noted.   LAB RESULTS:  CMP     Component Value Date/Time   NA 143 09/12/2012 1259   NA 136 08/16/2012 0510   K 3.6 09/12/2012 1259   K 4.1 08/16/2012 0510   CL 103 09/12/2012 1259   CL 101 08/16/2012 0510   CO2 28 09/12/2012 1259   CO2 30 08/16/2012 0510   GLUCOSE 97 09/12/2012 1259   GLUCOSE 120* 08/16/2012 0510   BUN 26.2* 09/12/2012 1259   BUN 13 08/16/2012 0510   CREATININE 1.3* 09/12/2012 1259   CREATININE 1.10 08/16/2012 0510   CALCIUM 10.9* 09/12/2012 1259   CALCIUM 8.5 08/16/2012 0510   PROT 7.2 09/12/2012 1259   PROT 7.9 06/05/2012 1455   ALBUMIN 3.3* 09/12/2012 1259   ALBUMIN 2.7* 06/05/2012 1455   AST 18 09/12/2012 1259   AST 15 06/05/2012 1455   ALT 13 09/12/2012 1259   ALT 9 06/05/2012 1455   ALKPHOS 59 09/12/2012 1259   ALKPHOS 66 06/05/2012 1455   BILITOT 0.44 09/12/2012 1259   BILITOT 0.9 06/05/2012 1455   GFRNONAA 52* 08/16/2012 0510   GFRAA 61* 08/16/2012 0510    Lab Results  Component Value Date   WBC 4.5 09/12/2012   NEUTROABS 3.1 09/12/2012   HGB 11.8 09/12/2012   HCT 34.7* 09/12/2012   MCV 87.7 09/12/2012   PLT 273 09/12/2012      Chemistry      Component Value Date/Time   NA 143 09/12/2012 1259   NA 136 08/16/2012 0510   K 3.6 09/12/2012 1259   K 4.1 08/16/2012 0510   CL 103 09/12/2012 1259   CL 101 08/16/2012 0510   CO2 28 09/12/2012 1259   CO2 30 08/16/2012 0510   BUN 26.2* 09/12/2012 1259   BUN 13 08/16/2012 0510   CREATININE 1.3* 09/12/2012 1259   CREATININE 1.10 08/16/2012 0510      Component Value Date/Time   CALCIUM 10.9* 09/12/2012 1259   CALCIUM 8.5 08/16/2012 0510   ALKPHOS 59 09/12/2012 1259   ALKPHOS 66 06/05/2012 1455   AST 18 09/12/2012 1259   AST 15 06/05/2012 1455   ALT 13 09/12/2012 1259   ALT 9 06/05/2012 1455   BILITOT 0.44  09/12/2012 1259   BILITOT 0.9 06/05/2012 1455       STUDIES: Most  recent mammogram on 04/01/2012 was unremarkable    ASSESSMENT: 62 y.o. [facility] woman   (1)  status post left lumpectomy 05/31/2011, for ductal carcinoma in situ, involving 2 sites, both estrogen and progesterone receptor positive, high-grade, with negative margins.   (2)  Completed adjuvant radiation may 2013, at which point she started tamoxifen.  (3)  status post right nephrectomy in April 2014.  (4)  Hypercalcemia   PLAN: Keeleigh appears to be doing very well with regards to her breast cancer, and will continue on tamoxifen which I have refilled for her today. She'll be due for her next mammogram in December, after which we will see her for routine followup and repeat labs.  I am going to ask that she come back in 3-4 weeks to repeat her metabolic panel, especially for followup of the hypercalcemia, and we will also go ahead and check a parathyroid hormone level at that time.  All this was reviewed in detail with the patient today who voices understanding and agreement with our plan. She'll call with any changes or problems.   Daniil Labarge, PA-C   09/12/2012 2:13 PM

## 2012-09-30 ENCOUNTER — Telehealth: Payer: Self-pay | Admitting: *Deleted

## 2012-09-30 NOTE — Telephone Encounter (Signed)
Pt called to cancel her lab for 10/10/12 and to r/s. gv appt for 10/09/12@ 11am per her request...td

## 2012-10-09 ENCOUNTER — Other Ambulatory Visit (HOSPITAL_BASED_OUTPATIENT_CLINIC_OR_DEPARTMENT_OTHER): Payer: BC Managed Care – PPO | Admitting: Lab

## 2012-10-09 DIAGNOSIS — D0590 Unspecified type of carcinoma in situ of unspecified breast: Secondary | ICD-10-CM

## 2012-10-09 DIAGNOSIS — Z853 Personal history of malignant neoplasm of breast: Secondary | ICD-10-CM

## 2012-10-09 LAB — COMPREHENSIVE METABOLIC PANEL (CC13)
AST: 17 U/L (ref 5–34)
Albumin: 3.3 g/dL — ABNORMAL LOW (ref 3.5–5.0)
Alkaline Phosphatase: 63 U/L (ref 40–150)
Potassium: 3.2 mEq/L — ABNORMAL LOW (ref 3.5–5.1)
Sodium: 138 mEq/L (ref 136–145)
Total Protein: 7.5 g/dL (ref 6.4–8.3)

## 2012-10-10 ENCOUNTER — Other Ambulatory Visit: Payer: Self-pay | Admitting: Physician Assistant

## 2012-10-10 ENCOUNTER — Other Ambulatory Visit: Payer: BC Managed Care – PPO | Admitting: Lab

## 2012-10-10 ENCOUNTER — Encounter: Payer: Self-pay | Admitting: Physician Assistant

## 2012-10-10 DIAGNOSIS — E876 Hypokalemia: Secondary | ICD-10-CM

## 2012-10-10 LAB — PTH, INTACT AND CALCIUM
Calcium: 9.5 mg/dL (ref 8.4–10.5)
PTH: 47.2 pg/mL (ref 14.0–72.0)

## 2012-10-10 MED ORDER — POTASSIUM CHLORIDE CRYS ER 20 MEQ PO TBCR: 20.0000 meq | EXTENDED_RELEASE_TABLET | Freq: Every day | ORAL | Status: DC

## 2012-10-10 NOTE — Progress Notes (Signed)
I spoke with Deanna Burke today by phone to review her recent lab results. When I saw her in May, she was hypercalcemic with an uncorrected calcium of 10.9.  She has been holding her oral calcium supplement since that visit.  Labs drawn yesterday, 10/09/2012, showed a normalized calcium of 9.5, and a normal parathyroid hormone of 47.2. I did note, however, that she was slightly hypokalemic which appears to have been a problem in the past as well. I am starting her on potassium supplementation, 20 mEq by mouth daily. We will recheck all of these labs when she returns for her followup in December.  Deanna Burke voices understanding of the above, and will call with any problems prior to her next appointment.   Micah Flesher, PA-C 10/10/2012

## 2012-12-09 ENCOUNTER — Ambulatory Visit (INDEPENDENT_AMBULATORY_CARE_PROVIDER_SITE_OTHER): Payer: Medicare Other | Admitting: Surgery

## 2012-12-09 ENCOUNTER — Encounter (INDEPENDENT_AMBULATORY_CARE_PROVIDER_SITE_OTHER): Payer: Self-pay | Admitting: Surgery

## 2012-12-09 VITALS — BP 118/62 | HR 82 | Resp 16 | Ht 64.0 in | Wt 166.8 lb

## 2012-12-09 DIAGNOSIS — Z853 Personal history of malignant neoplasm of breast: Secondary | ICD-10-CM

## 2012-12-09 DIAGNOSIS — Z86000 Personal history of in-situ neoplasm of breast: Secondary | ICD-10-CM

## 2012-12-09 NOTE — Progress Notes (Signed)
Subjective:     Patient ID: Deanna Burke, female   DOB: February 18, 1951, 62 y.o.   MRN: [id number]  HPI She is here for her long-term followup of her left breast cancer. She is 1-1/2 years status post left breast lumpectomy for ductal carcinoma in situ. She is on hormonal therapy. She also had radiation. She is stable from her breast point without any complaints. She denies nipple discharge. Her next mammogram is due later this year  Review of Systems     Objective:   Physical Exam On exam, there is no left arm swelling. There is no left axillary adenopathy. The breast is normal in appearance with no palpable masses and the areola is normal    Assessment:     Patient stable with history of ductal carcinoma in situ of left breast     Plan:     She will continue her some examinations and yearly mammograms. She will continue to be followed by the cancer center. I will see her back in one year

## 2013-01-24 ENCOUNTER — Telehealth: Payer: Self-pay | Admitting: *Deleted

## 2013-01-24 NOTE — Telephone Encounter (Signed)
Pt called to this RN to state she was seen by PA at GYN this am and was requested to contact our office due to new finding in LEFT armpit.  Per inquiry Mikelle states new area that feels " like a taut tendron ".  Pt is

## 2013-01-27 ENCOUNTER — Ambulatory Visit (HOSPITAL_BASED_OUTPATIENT_CLINIC_OR_DEPARTMENT_OTHER): Payer: BC Managed Care – PPO | Admitting: Oncology

## 2013-01-27 ENCOUNTER — Telehealth: Payer: Self-pay | Admitting: Oncology

## 2013-01-27 DIAGNOSIS — D0512 Intraductal carcinoma in situ of left breast: Secondary | ICD-10-CM | POA: Insufficient documentation

## 2013-01-27 DIAGNOSIS — D0590 Unspecified type of carcinoma in situ of unspecified breast: Secondary | ICD-10-CM

## 2013-01-27 DIAGNOSIS — Z170 Estrogen receptor positive status [ER+]: Secondary | ICD-10-CM

## 2013-01-27 NOTE — Progress Notes (Signed)
ID: Sharyne Richters OB: 15-Nov-1950  MR#: [id number]  ZWC#:[id number]  PCP: [name], MD GYN:   SU: Coralie Keens, MD OTHER MD: Jamal Maes, MD;  Alexis Frock, MD, Eppie Gibson, MD   HISTORY OF PRESENT ILLNESS: Deanna Burke had routine screening mammography 03/22/2011 at [facility]. This showed new microcalcifications in the left breast. She was recalled for additional views December 14, and these showed the calcifications to be pleomorphic and linear. 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   With this information the patient was referred for bilateral breast MRI, performed 04/27/2011. This showed no suspicious findings on the right. On the left there was 11 mm lobulated mass 5 cm from the prior biopsy clip. 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   Patient underwent left lumpectomy in February 2013 with negative margins. Additional treatment history is detailed below.  INTERVAL HISTORY: Deanna Burke returns today for for an unscheduled visit. She tells me that over the past week she's noted a "lump" in her left axilla. It has been itchy and a little bit sore. She is not aware of any unusual movements are activity that might have caused this. She had a CT scan today to followup on her kidney issues as she was in the building she dropped in for further evaluation of the left axillary problem.  REVIEW OF SYSTEMS: Deanna Burke has a completely negative review of systems except as noted above  PAST MEDICAL HISTORY: Past Medical History  Diagnosis Date  . Ulcerative colitis [phone]  . Pulmonary embolus 1971  . Phlebitis 1971  . Small bowel obstruction [id number]  . Small bowel perforation 1971/1996  . Large bowel perforation 1971/1996  . Scoliosis   . Torn rotator cuff 2005  . History of bone  density study 2012  . Fatigue   . Cataract     Left Eye  . Hot flashes   . 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 Hypertension   . Cancer     DCIS L breast  . H/O ulcerative colitis   . Breast cancer   . Arthritis     hands, lumbar spine, hips, right ankle  . S/P ileostomy   . S/P radiation therapy 07/24/11 - 09/06/11    Left Breast/ 5000 cGy in 25 Fractions with Boost of 1000 cGy in 5 Fractions  . 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 Abnormal finding on cardiovascular stress test, ischemia anterolateral 02/13/2012    follow heart cath - pt told one tiny blockage that didn't even matter  . Syncope, ? anginal equivilant 02/13/2012  . Ileostomy in place, secondary to ulcerative colitis x 20 years 02/13/2012  . History of recurrent UTIs 02/13/2012  . Sleep apnea 2009    uses cpap-setting is 1  . H/O nephrostomy 08/13/12    PT HAS RIGHT NEPHROSTOMY TUBE TO DRAINAGE BAG AT PRESENT TIME    PAST SURGICAL HISTORY: Past Surgical History  Procedure Laterality Date  . Colectomy  1973  . Ileostomy  1973  . Vaginoplasty  1975  . Exploratory laparotomy  1978/1990    with lysis of adhesions  . Eye surgery  05/08/2011    left cataract removal   . Appendectomy    . Rotator cuff repair  2006  Right  . Breast surgery  05/03/11    LEFT BREAST NEEDLE CORE BIOPSY- DCIS  . 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  ER:+, PR+,   . Abdominal hysterectomy  1996    partial  . Exploratory laps      several due to abd pain related to ulcerative colitis  . Cardiac catheterization    . Laparoscopic nephrectomy Right 08/14/2012    Procedure: RIGHT LAPAROSCOPIC RETROPERITONEAL LAPAROSCOPIC NEPHRECTOMY ;  Surgeon: Alexis Frock, MD;  Location: [facility];  Service: Urology;  Laterality: Right;  RIGHT LAPAROSCOPIC  RETROPERITONEAL LAPAROSCOPIC NEPHRECTOMY, POSSIBLE OPEN     FAMILY HISTORY Family History  Problem Relation Age of Onset  . Breast cancer Maternal Grandmother   . Stomach cancer Maternal Grandfather   . Cancer Paternal Grandmother     stomach  . Cancer Other     Breast  . Atrial fibrillation Mother   . Heart attack Father   . Atrial fibrillation Brother   . Heart attack Paternal Uncle   She had one brothers no sisters. There is no history of breast or ovarian cancer in the immediate family.   GYNECOLOGIC HISTORY:  menarche age 48, underwent hysterectomy and bilateral salpingo-oophorectomy in 1996. She took hormone replacement for 16 years, stopping only at the time of this diagnosis. She is GX P0.  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 She lives by herself, with no pets.     ADVANCED DIRECTIVES: Not in place   HEALTH MAINTENANCE: History  Substance Use Topics  . Smoking status: Never Smoker   . Smokeless tobacco: Never Used  . Alcohol Use: No     Colonoscopy: s/p ileostomy for ulcerative colitis  PAP: Nov 2012  Bone density: 2007 - Normal  Lipid panel: UTD  Allergies  Allergen Reactions  . Demerol Nausea And Vomiting  . Dilaudid [Hydromorphone Hcl] Nausea And Vomiting and Other (See Comments)    Reaction: muscle flexion and cramping also with nausea and vomiting  . Stadol [Butorphanol Tartrate] Nausea And Vomiting  . Amoxicillin Rash  . Cephalexin Rash  . Penicillins Rash    Current Outpatient Prescriptions  Medication Sig Dispense Refill  . aspirin 81 MG tablet Take 81 mg by mouth daily.      . Bismuth Subgallate (DEVROM) 200 MG CHEW Chew 400 mg by mouth 4 (four) times daily. For odor control - pt has ileostomy      . diclofenac sodium (VOLTAREN) 1 % GEL Apply 2 g topically 4 (four) times daily as needed (for pain). For shoulder/ joint pain      . fish oil-omega-3 fatty acids 1000 MG capsule Take 1 g by mouth  every morning.       . Multiple Vitamins-Minerals (MULTIVITAMIN WITH MINERALS) tablet Take 1 tablet by mouth daily.      . potassium chloride SA (K-DUR,KLOR-CON) 20 MEQ tablet Take 1 tablet (20 mEq total) by mouth daily.  30 tablet  5  . tamoxifen (NOLVADEX) 20 MG tablet Take 1 tablet (20 mg total) by mouth daily.  90 tablet  3  . triamterene-hydrochlorothiazide (MAXZIDE-25) 37.5-25 MG per tablet Take 1 tablet by mouth every morning.       No current facility-administered medications for this visit.    OBJECTIVE: Middle-aged white female in no acute distress There were no vitals filed for this visit.   There  is no weight on file to calculate BMI.    ECOG FS: 1 There were no vitals filed for this visit.  Sclerae unicteric, pupils equal round and reactive to light Oropharynx clear No cervical or supraclavicular adenopathy Lungs clear to auscultation bilaterally, no wheezes, no rales or rhonchi Heart regular rate and rhythm Abdomen soft, nontender, positive bowel sounds. There is a well-healing incision along the right flank status post recent nephrectomy. MSK no focal spinal tenderness, no peripheral edema Neuro: non-focal, well-oriented, anxious affect Breasts:  Right breast is unremarkable. Left breast is status post lumpectomy with no evidence of local recurrence. Deep in the left axilla there is an irregularity for which feels firm and fixed, and in my view is most likely a muscle. It does not feel like a lymph node. It is not tender, and there is no erythema or swelling.  LAB RESULTS:  CMP     Component Value Date/Time   NA 138 10/09/2012 1103   NA 136 08/16/2012 0510   K 3.2* 10/09/2012 1103   K 4.1 08/16/2012 0510   CL 105 10/09/2012 1103   CL 101 08/16/2012 0510   CO2 22 10/09/2012 1103   CO2 30 08/16/2012 0510   GLUCOSE 117* 10/09/2012 1103   GLUCOSE 120* 08/16/2012 0510   BUN 23.5 10/09/2012 1103   BUN 13 08/16/2012 0510   CREATININE 1.2* 10/09/2012 1103   CREATININE 1.10  08/16/2012 0510   CALCIUM 9.7 10/09/2012 1103   CALCIUM 9.5 10/09/2012 1103   PROT 7.5 10/09/2012 1103   PROT 7.9 06/05/2012 1455   ALBUMIN 3.3* 10/09/2012 1103   ALBUMIN 2.7* 06/05/2012 1455   AST 17 10/09/2012 1103   AST 15 06/05/2012 1455   ALT 13 10/09/2012 1103   ALT 9 06/05/2012 1455   ALKPHOS 63 10/09/2012 1103   ALKPHOS 66 06/05/2012 1455   BILITOT 0.60 10/09/2012 1103   BILITOT 0.9 06/05/2012 1455   GFRNONAA 52* 08/16/2012 0510   GFRAA 61* 08/16/2012 0510    Lab Results  Component Value Date   WBC 4.5 09/12/2012   NEUTROABS 3.1 09/12/2012   HGB 11.8 09/12/2012   HCT 34.7* 09/12/2012   MCV 87.7 09/12/2012   PLT 273 09/12/2012      Chemistry      Component Value Date/Time   NA 138 10/09/2012 1103   NA 136 08/16/2012 0510   K 3.2* 10/09/2012 1103   K 4.1 08/16/2012 0510   CL 105 10/09/2012 1103   CL 101 08/16/2012 0510   CO2 22 10/09/2012 1103   CO2 30 08/16/2012 0510   BUN 23.5 10/09/2012 1103   BUN 13 08/16/2012 0510   CREATININE 1.2* 10/09/2012 1103   CREATININE 1.10 08/16/2012 0510      Component Value Date/Time   CALCIUM 9.7 10/09/2012 1103   CALCIUM 9.5 10/09/2012 1103   ALKPHOS 63 10/09/2012 1103   ALKPHOS 66 06/05/2012 1455   AST 17 10/09/2012 1103   AST 15 06/05/2012 1455   ALT 13 10/09/2012 1103   ALT 9 06/05/2012 1455   BILITOT 0.60 10/09/2012 1103   BILITOT 0.9 06/05/2012 1455       STUDIES: Most recent mammogram on 04/01/2012 was unremarkable  ASSESSMENT: 62 y.o. [facility] woman   (1)  status post left lumpectomy 05/31/2011, for ductal carcinoma in situ, involving 2 sites, both estrogen and progesterone receptor positive, high-grade, with negative margins.   (2)  Completed adjuvant radiation may 2013, at which point she started tamoxifen.  (3)  status post right nephrectomy in April 2014.  (4)  Hypercalcemia   PLAN: The patient understands she has noninvasive breast cancer and therefore, it that diagnosis is accurate, she should not have any left axillary  spread. Nevertheless, there is an irregularity in the left axilla, which as described above he does not feel like a lymph node. I think most likely it is a muscle age. He does feel sore to her. We're going to set her up for a left mammogram and left ultrasound within the next day or 2 just to make sure we're not missing something important. She will call for results. If everything is fine, as I expect, we will cancel her December visit and reschedule for sometime next year.    Chauncey Cruel, MD   01/27/2013 11:04 AM

## 2013-01-27 NOTE — Telephone Encounter (Signed)
, °

## 2013-02-06 ENCOUNTER — Ambulatory Visit
Admission: RE | Admit: 2013-02-06 | Discharge: 2013-02-06 | Disposition: A | Discharge status: Home / Self Care | Payer: BC Managed Care – PPO | Source: Ambulatory Visit | Attending: Oncology | Admitting: Oncology

## 2013-02-06 DIAGNOSIS — C50912 Malignant neoplasm of unspecified site of left female breast: Secondary | ICD-10-CM

## 2013-02-12 NOTE — Telephone Encounter (Signed)
Pt called wanting to move her lab a week back. gv appt for 03/24/13 @ 11am. Pt is aware...td

## 2013-02-27 ENCOUNTER — Other Ambulatory Visit: Payer: Self-pay

## 2013-03-24 ENCOUNTER — Other Ambulatory Visit (HOSPITAL_BASED_OUTPATIENT_CLINIC_OR_DEPARTMENT_OTHER): Payer: BC Managed Care – PPO

## 2013-03-24 DIAGNOSIS — D0590 Unspecified type of carcinoma in situ of unspecified breast: Secondary | ICD-10-CM

## 2013-03-24 DIAGNOSIS — Z853 Personal history of malignant neoplasm of breast: Secondary | ICD-10-CM

## 2013-03-24 LAB — CBC WITH DIFFERENTIAL/PLATELET
Basophils Absolute: 0 10*3/uL (ref 0.0–0.1)
EOS%: 1.9 % (ref 0.0–7.0)
LYMPH%: 19.3 % (ref 14.0–49.7)
MCH: 28 pg (ref 25.1–34.0)
MCV: 85.3 fL (ref 79.5–101.0)
MONO%: 7.5 % (ref 0.0–14.0)
RBC: 4.57 10*6/uL (ref 3.70–5.45)
RDW: 14.2 % (ref 11.2–14.5)

## 2013-03-24 LAB — COMPREHENSIVE METABOLIC PANEL (CC13)
AST: 22 U/L (ref 5–34)
Albumin: 3.4 g/dL — ABNORMAL LOW (ref 3.5–5.0)
Alkaline Phosphatase: 66 U/L (ref 40–150)
BUN: 22.2 mg/dL (ref 7.0–26.0)
Potassium: 3.5 mEq/L (ref 3.5–5.1)
Sodium: 140 mEq/L (ref 136–145)
Total Bilirubin: 0.77 mg/dL (ref 0.20–1.20)

## 2013-03-31 ENCOUNTER — Other Ambulatory Visit: Payer: BC Managed Care – PPO

## 2013-04-07 ENCOUNTER — Telehealth: Payer: Self-pay | Admitting: *Deleted

## 2013-04-07 ENCOUNTER — Ambulatory Visit (HOSPITAL_BASED_OUTPATIENT_CLINIC_OR_DEPARTMENT_OTHER): Payer: BC Managed Care – PPO | Admitting: Oncology

## 2013-04-07 VITALS — BP 125/81 | HR 112 | Temp 98.1°F | Resp 18 | Ht 64.0 in | Wt 167.5 lb

## 2013-04-07 DIAGNOSIS — D0512 Intraductal carcinoma in situ of left breast: Secondary | ICD-10-CM

## 2013-04-07 DIAGNOSIS — C50412 Malignant neoplasm of upper-outer quadrant of left female breast: Secondary | ICD-10-CM | POA: Insufficient documentation

## 2013-04-07 DIAGNOSIS — Z170 Estrogen receptor positive status [ER+]: Secondary | ICD-10-CM | POA: Insufficient documentation

## 2013-04-07 DIAGNOSIS — D0590 Unspecified type of carcinoma in situ of unspecified breast: Secondary | ICD-10-CM

## 2013-04-07 MED ORDER — ESTRADIOL 10 MCG VA TABS: 1.0000 | ORAL_TABLET | VAGINAL | Status: DC

## 2013-04-07 NOTE — Addendum Note (Signed)
Addended by: Lannette Donath E on: 04/07/2013 01:03 PM   Modules accepted: Medications

## 2013-04-07 NOTE — Progress Notes (Signed)
ID: Sharyne Richters OB: [birth year]  MR#: [id number]  CSN#:[id number]  PCP: [name], MD GYN:  Halford Chessman SU: Coralie Keens, MD OTHER MD: Jamal Maes, MD;  Alexis Frock, MD, Eppie Gibson, MD   HISTORY OF PRESENT ILLNESS: Deanna Burke had routine screening mammography 03/22/2011 at [facility]. This showed new microcalcifications in the left breast. She was recalled for additional views December 14, and these showed the calcifications to be pleomorphic and linear. 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   With this information the patient was referred for bilateral breast MRI, performed 04/27/2011. This showed no suspicious findings on the right. On the left there was 11 mm lobulated mass 5 cm from the prior biopsy clip. 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   Patient underwent left lumpectomy in February 2013 with negative margins. Additional treatment history is detailed below.  INTERVAL HISTORY: The patient returns today for further discussion of her breast cancer and related issues. She's tolerating the tamoxifen with no significant side effects. However she is having itching in her left nipple, which was the presenting symptom originally 2 years ago. This is of great concern to her.  REVIEW OF SYSTEMS: In addition, she is having significant vaginal dryness problems. Her urine is cloudy. Her urologist thinks this may be because of lack of estrogen, and that the urine might clear with local estrogen creams. Aside from these issues, the patient's detailed review of systems today was negative.  PAST MEDICAL HISTORY: Past Medical History  Diagnosis Date  . Ulcerative colitis [phone]  . Pulmonary embolus 1971  . Phlebitis 1971  . Small bowel obstruction [id number]  . Small bowel perforation  1971/1996  . Large bowel perforation 1971/1996  . Scoliosis   . Torn rotator cuff 2005  . History of bone density study 2012  . Fatigue   . Cataract     Left Eye  . Hot flashes   . 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 Hypertension   . Cancer     DCIS L breast  . H/O ulcerative colitis   . Breast cancer   . Arthritis     hands, lumbar spine, hips, right ankle  . S/P ileostomy   . S/P radiation therapy 07/24/11 - 09/06/11    Left Breast/ 5000 cGy in 25 Fractions with Boost of 1000 cGy in 5 Fractions  . 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 Abnormal finding on cardiovascular stress test, ischemia anterolateral 02/13/2012    follow heart cath - pt told one tiny blockage that didn't even matter  . Syncope, ? anginal equivilant 02/13/2012  . Ileostomy in place, secondary to ulcerative colitis x 20 years 02/13/2012  . History of recurrent UTIs 02/13/2012  . Sleep apnea 2009    uses cpap-setting is 1  . H/O nephrostomy 08/13/12    PT HAS RIGHT NEPHROSTOMY TUBE TO DRAINAGE BAG AT PRESENT TIME    PAST SURGICAL HISTORY: Past Surgical History  Procedure Laterality Date  . Colectomy  1973  . Ileostomy  1973  . Vaginoplasty  1975  . Exploratory laparotomy  1978/1990    with lysis of adhesions  . Eye surgery  05/08/2011    left cataract removal   . Appendectomy    .  Rotator cuff repair  2006    Right  . Breast surgery  05/03/11    LEFT BREAST NEEDLE CORE BIOPSY- DCIS  . 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  ER:+, PR+,   . Abdominal hysterectomy  1996    partial  . Exploratory laps      several due to abd pain related to ulcerative colitis  . Cardiac catheterization    . Laparoscopic nephrectomy Right 08/14/2012    Procedure: RIGHT LAPAROSCOPIC RETROPERITONEAL LAPAROSCOPIC NEPHRECTOMY ;   Surgeon: Alexis Frock, MD;  Location: [facility];  Service: Urology;  Laterality: Right;  RIGHT LAPAROSCOPIC RETROPERITONEAL LAPAROSCOPIC NEPHRECTOMY, POSSIBLE OPEN     FAMILY HISTORY Family History  Problem Relation Age of Onset  . Breast cancer Maternal Grandmother   . Stomach cancer Maternal Grandfather   . Cancer Paternal Grandmother     stomach  . Cancer Other     Breast  . Atrial fibrillation Mother   . Heart attack Father   . Atrial fibrillation Brother   . Heart attack Paternal Uncle   She had one brothers no sisters. There is no history of breast or ovarian cancer in the immediate family.   GYNECOLOGIC HISTORY:  menarche age 50, underwent hysterectomy and bilateral salpingo-oophorectomy in 1996. She took hormone replacement for 16 years, stopping only at the time of this diagnosis. She is GX P0.  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 She lives by herself, with no pets.     ADVANCED DIRECTIVES: Not in place   HEALTH MAINTENANCE: History  Substance Use Topics  . Smoking status: Never Smoker   . Smokeless tobacco: Never Used  . Alcohol Use: No     Colonoscopy: s/p ileostomy for ulcerative colitis  PAP: Nov 2012  Bone density: 2007 - Normal  Lipid panel: UTD  Allergies  Allergen Reactions  . Demerol Nausea And Vomiting  . Dilaudid [Hydromorphone Hcl] Nausea And Vomiting and Other (See Comments)    Reaction: muscle flexion and cramping also with nausea and vomiting  . Stadol [Butorphanol Tartrate] Nausea And Vomiting  . Amoxicillin Rash  . Cephalexin Rash  . Penicillins Rash    Current Outpatient Prescriptions  Medication Sig Dispense Refill  . aspirin 81 MG tablet Take 81 mg by mouth daily.      . Bismuth Subgallate (DEVROM) 200 MG CHEW Chew 400 mg by mouth 4 (four) times daily. For odor control - pt has ileostomy      . diclofenac sodium (VOLTAREN) 1 % GEL Apply 2 g topically 4 (four) times daily as needed (for  pain). For shoulder/ joint pain      . fish oil-omega-3 fatty acids 1000 MG capsule Take 1 g by mouth every morning.       . Multiple Vitamins-Minerals (MULTIVITAMIN WITH MINERALS) tablet Take 1 tablet by mouth daily.      . potassium chloride SA (K-DUR,KLOR-CON) 20 MEQ tablet Take 1 tablet (20 mEq total) by mouth daily.  30 tablet  5  . tamoxifen (NOLVADEX) 20 MG tablet Take 1 tablet (20 mg total) by mouth daily.  90 tablet  3  . triamterene-hydrochlorothiazide (MAXZIDE-25) 37.5-25 MG per tablet Take 1 tablet by mouth every morning.       No current facility-administered medications for this visit.    OBJECTIVE: Middle-aged white female who appears well Filed Vitals:  04/07/13 1202  BP: 125/81  Pulse: 112  Temp: 98.1 F (36.7 C)  Resp: 18     Body mass index is 28.74 kg/(m^2).    ECOG FS: 1 Filed Weights   04/07/13 1202  Weight: 167 lb 8 oz (75.978 kg)    Sclerae unicteric, pupils equal round and reactive to light Oropharynx clear, no thrush or other lesions No cervical or supraclavicular adenopathy Lungs clear to auscultation bilaterally, no wheezes, no rales or rhonchi Heart regular rate and rhythm Abdomen soft, nontender, positive bowel sounds. There is a well-healing incision along the right flank status post recent nephrectomy. MSK no focal spinal tenderness, no peripheral edema Neuro: non-focal, well-oriented, anxious affect Breasts:  Right breast is unremarkable. Left breast is status post lumpectomy with no evidence of local recurrence. Examination of the nipple and areola are entirely benign on the left side. In particular there is no crusting, erythema, or discharge. Left axilla is benign  LAB RESULTS:  CMP     Component Value Date/Time   NA 140 03/24/2013 1102   NA 136 08/16/2012 0510   K 3.5 03/24/2013 1102   K 4.1 08/16/2012 0510   CL 105 10/09/2012 1103   CL 101 08/16/2012 0510   CO2 26 03/24/2013 1102   CO2 30 08/16/2012 0510   GLUCOSE 95 03/24/2013 1102    GLUCOSE 117* 10/09/2012 1103   GLUCOSE 120* 08/16/2012 0510   BUN 22.2 03/24/2013 1102   BUN 13 08/16/2012 0510   CREATININE 1.2* 03/24/2013 1102   CREATININE 1.10 08/16/2012 0510   CALCIUM 9.7 03/24/2013 1102   CALCIUM 9.5 10/09/2012 1103   PROT 7.5 03/24/2013 1102   PROT 7.9 06/05/2012 1455   ALBUMIN 3.4* 03/24/2013 1102   ALBUMIN 2.7* 06/05/2012 1455   AST 22 03/24/2013 1102   AST 15 06/05/2012 1455   ALT 15 03/24/2013 1102   ALT 9 06/05/2012 1455   ALKPHOS 66 03/24/2013 1102   ALKPHOS 66 06/05/2012 1455   BILITOT 0.77 03/24/2013 1102   BILITOT 0.9 06/05/2012 1455   GFRNONAA 52* 08/16/2012 0510   GFRAA 61* 08/16/2012 0510    Lab Results  Component Value Date   WBC 4.6 03/24/2013   NEUTROABS 3.2 03/24/2013   HGB 12.8 03/24/2013   HCT 38.9 03/24/2013   MCV 85.3 03/24/2013   PLT 299 03/24/2013      Chemistry      Component Value Date/Time   NA 140 03/24/2013 1102   NA 136 08/16/2012 0510   K 3.5 03/24/2013 1102   K 4.1 08/16/2012 0510   CL 105 10/09/2012 1103   CL 101 08/16/2012 0510   CO2 26 03/24/2013 1102   CO2 30 08/16/2012 0510   BUN 22.2 03/24/2013 1102   BUN 13 08/16/2012 0510   CREATININE 1.2* 03/24/2013 1102   CREATININE 1.10 08/16/2012 0510      Component Value Date/Time   CALCIUM 9.7 03/24/2013 1102   CALCIUM 9.5 10/09/2012 1103   ALKPHOS 66 03/24/2013 1102   ALKPHOS 66 06/05/2012 1455   AST 22 03/24/2013 1102   AST 15 06/05/2012 1455   ALT 15 03/24/2013 1102   ALT 9 06/05/2012 1455   BILITOT 0.77 03/24/2013 1102   BILITOT 0.9 06/05/2012 1455       STUDIES: DIGITAL DIAGNOSTIC UNILATERAL LEFT MAMMOGRAM; ULTRASOUND LEFT BREAST  COMPARISON: Prior examinations.  ACR Breast Density Category c: The breasts are heterogeneously  dense, which may obscure small masses.  FINDINGS:  No concerning masses, calcifications or architectural  distortion  identified within the left breast. Stable postsurgical change.  Mammographic images were processed with CAD.  On physical exam I palpate firm muscle  underlying the left axilla at  the patients reported site of palpable abnormality.  Ultrasound is performed, showing no concerning mass. The palpable  abnormality corresponds with muscle within the left axillary region.  IMPRESSION:  No mammographic evidence for malignancy. Stable post lumpectomy  changes.  RECOMMENDATION:  Bilateral screening mammography December of 2014.  I have discussed the findings and recommendations with the patient.  Results were also provided in writing at the conclusion of the  visit. If applicable, a reminder letter will be sent to the patient  regarding the next appointment.  BI-RADS CATEGORY 1: Negative  Electronically Signed  By: Lovey Newcomer M.D.  On: 02/06/2013 14:36   ASSESSMENT: 61 y.o. [place] woman   (1)  status post left lumpectomy 05/31/2011, for ductal carcinoma in situ, involving 2 sites, both estrogen and progesterone receptor positive, high-grade, with negative margins.   (2)  Completed adjuvant radiation May 2013, at which point she started tamoxifen.  (3)  status post right nephrectomy in April 2014 with benign pathology  (4)  Hypercalcemia-- resolved with patients going off calcium supplementation   PLAN: She is appropriately concerned because she had some itching in the left nipple before her initial breast cancer diagnosis. However exam of the left nipple and areola today was entirely normal. She is already scheduled for bilateral mammography tomorrow. I think that will be reassuring.  As far as the itching itself I am going to recommend Cortaid cream.  I do not have a problem with her using vaginal estrogen preparations such as Vagifem or Estring while on tamoxifen. Tamoxifen works even in the presence of estrogen, so if she absorbs a little bit of estrogen through the vaginal wall that would not be an issue. Recall also that she status post hysterectomy, so that endometrial cancer is not a concern. Accordingly I went ahead and  wrote her a prescription for Vagifem today. She will let us know in about 6 weeks whether or not this is working for her.  Otherwise she will see [place] again in 6 months. She'll see me again in January of 2016. At that time we will start seeing her on a once a year basis.     Chauncey Cruel, MD   04/07/2013 12:14 PM

## 2013-04-07 NOTE — Telephone Encounter (Signed)
appts made and printed...td 

## 2013-04-08 ENCOUNTER — Ambulatory Visit
Admission: RE | Admit: 2013-04-08 | Discharge: 2013-04-08 | Disposition: A | Discharge status: Home / Self Care | Payer: BC Managed Care – PPO | Source: Ambulatory Visit | Attending: Physician Assistant | Admitting: Physician Assistant

## 2013-04-08 DIAGNOSIS — Z853 Personal history of malignant neoplasm of breast: Secondary | ICD-10-CM

## 2013-04-11 ENCOUNTER — Other Ambulatory Visit: Payer: Self-pay | Admitting: *Deleted

## 2013-04-11 DIAGNOSIS — E876 Hypokalemia: Secondary | ICD-10-CM

## 2013-04-11 DIAGNOSIS — D0512 Intraductal carcinoma in situ of left breast: Secondary | ICD-10-CM

## 2013-04-11 MED ORDER — POTASSIUM CHLORIDE CRYS ER 20 MEQ PO TBCR: 20.0000 meq | EXTENDED_RELEASE_TABLET | Freq: Every day | ORAL | Status: DC

## 2013-04-24 DIAGNOSIS — Z87442 Personal history of urinary calculi: Secondary | ICD-10-CM

## 2013-04-24 HISTORY — DX: Personal history of urinary calculi: Z87.442

## 2013-09-19 ENCOUNTER — Other Ambulatory Visit: Payer: Self-pay | Admitting: *Deleted

## 2013-09-19 DIAGNOSIS — E876 Hypokalemia: Secondary | ICD-10-CM

## 2013-09-19 DIAGNOSIS — D0512 Intraductal carcinoma in situ of left breast: Secondary | ICD-10-CM

## 2013-09-19 MED ORDER — POTASSIUM CHLORIDE CRYS ER 20 MEQ PO TBCR: 20.0000 meq | EXTENDED_RELEASE_TABLET | Freq: Every day | ORAL | Status: DC

## 2013-09-23 ENCOUNTER — Other Ambulatory Visit: Payer: Self-pay | Admitting: *Deleted

## 2013-09-23 DIAGNOSIS — C50412 Malignant neoplasm of upper-outer quadrant of left female breast: Secondary | ICD-10-CM

## 2013-09-23 MED ORDER — TAMOXIFEN CITRATE 20 MG PO TABS: 20.0000 mg | ORAL_TABLET | Freq: Every day | ORAL | Status: DC

## 2013-10-06 ENCOUNTER — Ambulatory Visit (HOSPITAL_BASED_OUTPATIENT_CLINIC_OR_DEPARTMENT_OTHER): Payer: BC Managed Care – PPO

## 2013-10-06 ENCOUNTER — Encounter: Payer: Self-pay | Admitting: Physician Assistant

## 2013-10-06 ENCOUNTER — Telehealth: Payer: Self-pay | Admitting: *Deleted

## 2013-10-06 ENCOUNTER — Ambulatory Visit (HOSPITAL_BASED_OUTPATIENT_CLINIC_OR_DEPARTMENT_OTHER): Payer: BC Managed Care – PPO | Admitting: Physician Assistant

## 2013-10-06 ENCOUNTER — Telehealth: Payer: Self-pay | Admitting: Physician Assistant

## 2013-10-06 VITALS — BP 126/78 | HR 101 | Temp 97.8°F | Resp 18 | Ht 64.0 in | Wt 164.8 lb

## 2013-10-06 DIAGNOSIS — D0512 Intraductal carcinoma in situ of left breast: Secondary | ICD-10-CM

## 2013-10-06 DIAGNOSIS — Z853 Personal history of malignant neoplasm of breast: Secondary | ICD-10-CM

## 2013-10-06 DIAGNOSIS — E876 Hypokalemia: Secondary | ICD-10-CM

## 2013-10-06 DIAGNOSIS — Z170 Estrogen receptor positive status [ER+]: Secondary | ICD-10-CM

## 2013-10-06 DIAGNOSIS — B379 Candidiasis, unspecified: Secondary | ICD-10-CM

## 2013-10-06 DIAGNOSIS — C50412 Malignant neoplasm of upper-outer quadrant of left female breast: Secondary | ICD-10-CM

## 2013-10-06 DIAGNOSIS — D0590 Unspecified type of carcinoma in situ of unspecified breast: Secondary | ICD-10-CM

## 2013-10-06 DIAGNOSIS — R300 Dysuria: Secondary | ICD-10-CM

## 2013-10-06 LAB — CBC WITH DIFFERENTIAL/PLATELET
BASO%: 0.9 % (ref 0.0–2.0)
Basophils Absolute: 0 10*3/uL (ref 0.0–0.1)
EOS%: 2.8 % (ref 0.0–7.0)
Eosinophils Absolute: 0.1 10*3/uL (ref 0.0–0.5)
HEMATOCRIT: 43.4 % (ref 34.8–46.6)
HGB: 14.2 g/dL (ref 11.6–15.9)
LYMPH%: 19.4 % (ref 14.0–49.7)
MCH: 29.2 pg (ref 25.1–34.0)
MCHC: 32.8 g/dL (ref 31.5–36.0)
MCV: 89 fL (ref 79.5–101.0)
MONO#: 0.4 10*3/uL (ref 0.1–0.9)
MONO%: 7.2 % (ref 0.0–14.0)
NEUT#: 3.7 10*3/uL (ref 1.5–6.5)
NEUT%: 69.7 % (ref 38.4–76.8)
PLATELETS: 279 10*3/uL (ref 145–400)
RBC: 4.88 10*6/uL (ref 3.70–5.45)
RDW: 13.9 % (ref 11.2–14.5)
WBC: 5.3 10*3/uL (ref 3.9–10.3)
lymph#: 1 10*3/uL (ref 0.9–3.3)

## 2013-10-06 LAB — COMPREHENSIVE METABOLIC PANEL (CC13)
ALK PHOS: 68 U/L (ref 40–150)
ALT: 15 U/L (ref 0–55)
AST: 19 U/L (ref 5–34)
Albumin: 3.8 g/dL (ref 3.5–5.0)
Anion Gap: 12 mEq/L — ABNORMAL HIGH (ref 3–11)
BILIRUBIN TOTAL: 0.82 mg/dL (ref 0.20–1.20)
BUN: 23.7 mg/dL (ref 7.0–26.0)
CO2: 26 meq/L (ref 22–29)
CREATININE: 1.3 mg/dL — AB (ref 0.6–1.1)
Calcium: 10.6 mg/dL — ABNORMAL HIGH (ref 8.4–10.4)
Chloride: 103 mEq/L (ref 98–109)
GLUCOSE: 89 mg/dL (ref 70–140)
Potassium: 3.6 mEq/L (ref 3.5–5.1)
Sodium: 141 mEq/L (ref 136–145)
Total Protein: 8 g/dL (ref 6.4–8.3)

## 2013-10-06 MED ORDER — POTASSIUM CHLORIDE CRYS ER 20 MEQ PO TBCR: 20.0000 meq | EXTENDED_RELEASE_TABLET | Freq: Every day | ORAL | Status: DC

## 2013-10-06 MED ORDER — TAMOXIFEN CITRATE 20 MG PO TABS: 20.0000 mg | ORAL_TABLET | Freq: Every day | ORAL | Status: DC

## 2013-10-06 NOTE — Telephone Encounter (Signed)
Called pt to inform her of lab results. No answer, but left a message on VM. Told pt to continue on potassium 54mq tablet daily. I told pt a refill was sent to her pharmacy. Also I told pt the Calcium and Creatinine was still slightly elevated and the lab results have been forwarded to Dr. AHolley Raring If pt has any questions she can reach this nurse @ [phone] Message to be forwarded to ACampbell Soup PA-C.

## 2013-10-06 NOTE — Progress Notes (Signed)
ID: Deanna Burke OB: 12-17-1950  MR#: [id number]  DUK#:[id number]  PCP: [name], MD GYN:  Halford Chessman SU: Coralie Keens, MD OTHER MD: Jamal Maes, MD;  Alexis Frock, MD, Eppie Gibson, MD  CHIEF COMPLAINT:  Hx of Left Breast Cancer (tamoxifen)   HISTORY OF PRESENT ILLNESS: Deanna Burke had routine screening mammography 03/22/2011 at [facility]. This showed new microcalcifications in the left breast. She was recalled for additional views April 07, 2011, and these showed the calcifications to be pleomorphic and linear. 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   With this information the patient was referred for bilateral breast MRI, performed 04/27/2011. This showed no suspicious findings on the right. On the left there was 11 mm lobulated mass 5 cm from the prior biopsy clip. 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   Patient underwent left lumpectomy in February 2013 with negative margins.   Additional treatment history is detailed below.  INTERVAL HISTORY: Deanna Burke returns alone today for a routine six-month followup of her left breast cancer. She previously complained of itching in the left nipple, but this has completely resolved. She is tolerating the tamoxifen well. She denies actual vaginal dryness, but has vaginal irritation and has had several vaginal yeast infections since her appointment here in December. These have been treated by her gynecologist effectively with fluconazole. She was given a prescription for Vagifem by Dr. Jana Hakim in December. She had the prescription filled, but never started using the medication. She wonders if this would decrease the irritation.   Otherwise, Deanna Burke has only occasional hot flashes, nothing she would consider problematic. She denies any  abnormal vaginal bleeding. She's had no peripheral swelling. She denies abnormal blood clots. She also denies any change in vision. She has her eyes examined on an annual basis and has had one cataract removed in the past.  She continues to have some occasional pain with urination, continues to have "cloudy" urine, and continues to have a slightly elevated serum creatinine. She is still followed closely by her nephrologist, Dr. Lorrene Reid, with a history of right nephrectomy in April 2014.   Of note, Deanna Burke also has a history of mild hypokalemia, and continues on a potassium supplement, 20 mEq daily.   REVIEW OF SYSTEMS: Deanna Burke denies any recent illnesses and has had no fevers, chills, or night sweats. She sleeps well and has a good energy level. She's had no rashes or skin changes and denies any abnormal bruising or bleeding. Her appetite is good, and she denies any nausea, emesis, or change in bowel habits. She's had no new cough, production, shortness of breath, peripheral swelling, chest pain, or palpitations. She denies any abnormal headaches or dizziness. She denies any new or unusual myalgias, arthralgias, or bony pain.  A detailed review of systems is otherwise stable and noncontributory.   PAST MEDICAL HISTORY: Past Medical History  Diagnosis Date  . Ulcerative colitis [phone]  . Pulmonary embolus 1971  . Phlebitis 1971  . Small bowel obstruction [id number]  . Small bowel perforation 1971/1996  . Large bowel perforation 1971/1996  . Scoliosis   . Torn rotator cuff 2005  . History of bone density study 2012  . Fatigue   . Cataract     Left Eye  . Hot flashes   . Complication of anesthesia 1996    pt has ileostomy and had surgery in 1996  that paralyzed  . Hypertension   . Cancer     DCIS L breast  . H/O ulcerative colitis   . Breast cancer   . Arthritis     hands, lumbar spine, hips, right ankle  . S/P ileostomy   . S/P radiation therapy 07/24/11 - 09/06/11    Left  Breast/ 5000 cGy in 25 Fractions with Boost of 1000 cGy in 5 Fractions  . 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 Abnormal finding on cardiovascular stress test, ischemia anterolateral 02/13/2012    follow heart cath - pt told one tiny blockage that didn't even matter  . Syncope, ? anginal equivilant 02/13/2012  . Ileostomy in place, secondary to ulcerative colitis x 20 years 02/13/2012  . History of recurrent UTIs 02/13/2012  . Sleep apnea 2009    uses cpap-setting is 1  . H/O nephrostomy 08/13/12    PT HAS RIGHT NEPHROSTOMY TUBE TO DRAINAGE BAG AT PRESENT TIME    PAST SURGICAL HISTORY: Past Surgical History  Procedure Laterality Date  . Colectomy  1973  . Ileostomy  1973  . Vaginoplasty  1975  . Exploratory laparotomy  1978/1990    with lysis of adhesions  . Eye surgery  05/08/2011    left cataract removal   . Appendectomy    . Rotator cuff repair  2006    Right  . Breast surgery  05/03/11    LEFT BREAST NEEDLE CORE BIOPSY- DCIS  . 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  ER:+, PR+,   . Abdominal hysterectomy  1996    partial  . Exploratory laps      several due to abd pain related to ulcerative colitis  . Cardiac catheterization    . Laparoscopic nephrectomy Right 08/14/2012    Procedure: RIGHT LAPAROSCOPIC RETROPERITONEAL LAPAROSCOPIC NEPHRECTOMY ;  Surgeon: Alexis Frock, MD;  Location: [facility];  Service: Urology;  Laterality: Right;  RIGHT LAPAROSCOPIC RETROPERITONEAL LAPAROSCOPIC NEPHRECTOMY, POSSIBLE OPEN     FAMILY HISTORY Family History  Problem Relation Age of Onset  . Breast cancer Maternal Grandmother   . Stomach cancer Maternal Grandfather   . Cancer Paternal Grandmother     stomach  . Cancer Other     Breast  . Atrial fibrillation Mother   . Heart attack Father   . Atrial fibrillation Brother   . Heart attack Paternal  Uncle   She had one brothers no sisters. There is no history of breast or ovarian cancer in the immediate family.   GYNECOLOGIC HISTORY:  (Reviewed 10/06/2013) menarche age 63, underwent hysterectomy and bilateral salpingo-oophorectomy in 1996. She took hormone replacement for 16 years, stopping only at the time of this diagnosis. She is GX P0.  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 She is divorced. She lives by herself, with no pets.     ADVANCED DIRECTIVES: Not in place   HEALTH MAINTENANCE:  (Updated 10/06/2013) History  Substance Use Topics  . Smoking status: Never Smoker   . Smokeless tobacco: Never Used  . Alcohol Use: No     Colonoscopy: s/p ileostomy for ulcerative colitis  PAP: Nov 2012  Bone density: 2007 - Normal  Lipid panel: Not on file/Dr. Arvind  Allergies  Allergen Reactions  . Demerol Nausea And Vomiting  . Dilaudid [Hydromorphone Hcl] Nausea And Vomiting and Other (See Comments)  Reaction: muscle flexion and cramping also with nausea and vomiting  . Stadol [Butorphanol Tartrate] Nausea And Vomiting  . Amoxicillin Rash  . Cephalexin Rash  . Penicillins Rash    Current Outpatient Prescriptions  Medication Sig Dispense Refill  . aspirin 81 MG tablet Take 81 mg by mouth daily.      . Bismuth Subgallate (DEVROM) 200 MG CHEW Chew 400 mg by mouth 4 (four) times daily. For odor control - pt has ileostomy      . diclofenac sodium (VOLTAREN) 1 % GEL Apply 2 g topically 4 (four) times daily as needed (for pain). For shoulder/ joint pain      . fluconazole (DIFLUCAN) 100 MG tablet Take 100 mg by mouth daily. Take 1 tablet by mouth every day for 3 days for yeast infection as needed.      . Multiple Vitamins-Minerals (MULTIVITAMIN WITH MINERALS) tablet Take 1 tablet by mouth daily.      [name] oxybutynin (DITROPAN) 5 MG tablet       . potassium chloride SA (K-DUR,KLOR-CON) 20 MEQ tablet Take 1 tablet (20 mEq total) by  mouth daily.  90 tablet  1  . tamoxifen (NOLVADEX) 20 MG tablet Take 1 tablet (20 mg total) by mouth daily.  90 tablet  3  . triamterene-hydrochlorothiazide (MAXZIDE-25) 37.5-25 MG per tablet Take 1 tablet by mouth every morning.      . Estradiol (VAGIFEM) 10 MCG TABS vaginal tablet Place 1 tablet (10 mcg total) vaginally 2 (two) times a week.  8 tablet  12  . fish oil-omega-3 fatty acids 1000 MG capsule Take 1 g by mouth every morning.        No current facility-administered medications for this visit.    OBJECTIVE: Middle-aged white female who appears well and is in no acute distress Filed Vitals:   10/06/13 1116  BP: 126/78  Pulse: 101  Temp: 97.8 F (36.6 C)  Resp: 18     Body mass index is 28.27 kg/(m^2).    ECOG FS: 0 Filed Weights   10/06/13 1116  Weight: 164 lb 12.8 oz (74.753 kg)   Physical Exam: HEENT:  Sclerae anicteric.  Oropharynx clear, pink, and moist. Neck supple, trachea midline. No thyromegaly.  NODES:  No cervical or supraclavicular lymphadenopathy palpated.  BREAST EXAM: Right breast is unremarkable. Left breast is status post lumpectomy. No suspicious nodularity, masses, or skin changes noted. No evidence of local recurrence. Axillae are benign bilaterally, no palpable lymphadenopathy. LUNGS:  Clear to auscultation bilaterally.  No wheezes or rhonchi HEART:  Regular rate and rhythm. No murmur appreciated. ABDOMEN:  Soft, nontender.  Positive bowel sounds.  MSK:  No focal spinal tenderness to palpation. Good range of motion bilaterally in the upper extremities. EXTREMITIES:  No peripheral edema.  No lymphedema in the left upper extremity. SKIN:  Visible rashes. No excessive ecchymoses. No petechiae. No pallor. Good skin turgor. NEURO:  Nonfocal. Well oriented.  Appropriate affect.   LAB RESULTS:    Lab Results  Component Value Date   WBC 5.3 10/06/2013   NEUTROABS 3.7 10/06/2013   HGB 14.2 10/06/2013   HCT 43.4 10/06/2013   MCV 89.0 10/06/2013   PLT 279  10/06/2013      Chemistry      Component Value Date/Time   NA 141 10/06/2013 1239   NA 136 08/16/2012 0510   K 3.6 10/06/2013 1239   K 4.1 08/16/2012 0510   CL 105 10/09/2012 1103   CL 101 08/16/2012 0510  CO2 26 10/06/2013 1239   CO2 30 08/16/2012 0510   BUN 23.7 10/06/2013 1239   BUN 13 08/16/2012 0510   CREATININE 1.3* 10/06/2013 1239   CREATININE 1.10 08/16/2012 0510      Component Value Date/Time   CALCIUM 10.6* 10/06/2013 1239   CALCIUM 9.5 10/09/2012 1103   ALKPHOS 68 10/06/2013 1239   ALKPHOS 66 06/05/2012 1455   AST 19 10/06/2013 1239   AST 15 06/05/2012 1455   ALT 15 10/06/2013 1239   ALT 9 06/05/2012 1455   BILITOT 0.82 10/06/2013 1239   BILITOT 0.9 06/05/2012 1455       STUDIES:  This recent bilateral mammogram was 04/08/2013, and was unremarkable.    ASSESSMENT: 63 y.o. [place] woman   (1)  status post left lumpectomy 05/31/2011, for ductal carcinoma in situ, involving 2 sites, both estrogen and progesterone receptor positive, high-grade, with negative margins.   (2)  Completed adjuvant radiation May 2013  (3)  began on tamoxifen in May 2013  (4)  status post right nephrectomy in April 2014 with benign pathology  (5)  Hypercalcemia-- intermittent based on prior labs  (6)  history of hypokalemia, on triamterene/HCTZ for history of hypertension - normalized and stable with oral potassium supplementation of 20 mEq daily  PLAN: With regards to her breast cancer, Sharleen appears to be doing well, and we are making no changes in her current regimen. I have refilled her tamoxifen for another year. We did again discuss the use of Vagifem, and I do think this would decrease her irritation, and possibly help decrease both her vaginal yeast issues and the urinary discomfort.  She has the Vagifem on hand at home, and plans to try the medication.  Her labs today show a normalized potassium, but on the low end of normal. I have refilled her potassium supplement, and she will  continue at 20 mEq daily. She'll continue to have her labs followed by her primary care physician, Dr. Holley Raring, who is actually prescribing her triamterene/HCTZ.  In addition, she also has an elevated but stable serum creatinine of 1.3. Her calcium is again slightly elevated today at 10.6. I have forwarded all of these lab results to Dr. Holley Raring for his evaluation. Of course we'll continue to follow her labs when she returns here on a q. 6 month basis.   Livy will have her next bilateral mammogram in December, and return to see Dr. Jana Hakim in January 2016 for repeat labs and physical exam. All the above was reviewed in detail with Belenda Cruise, and she voices both her understanding and agreement with this plan. She knows to call any changes or problems prior to her next appointment.   Dorlis Judice, PA-C   10/06/2013 1:30 PM

## 2013-10-06 NOTE — Telephone Encounter (Signed)
per pof to sch pt appt-sent pt back to lab today-sch pt mamma-gave pt copy of sch

## 2013-10-18 IMAGING — MG MM DIGITAL SCREENING BILAT W/ CAD
4 series · 4 of 4 positions shown · non-contrast
Comparison: none

DG SCREEN MAMMOGRAM BILATERAL
Bilateral CC and MLO view(s) were taken.
Technologist: Shakia Mucci, RT RM

DIGITAL SCREENING MAMMOGRAM WITH CAD:
There are scattered fibroglandular densities.  Microcalcifications are present in the left breast. 
Characterization with magnification views is recommended.  No mass or malignant type 
calcifications are identified in the right breast.  Compared with prior studies.
Images were processed with CAD.

[R CC]
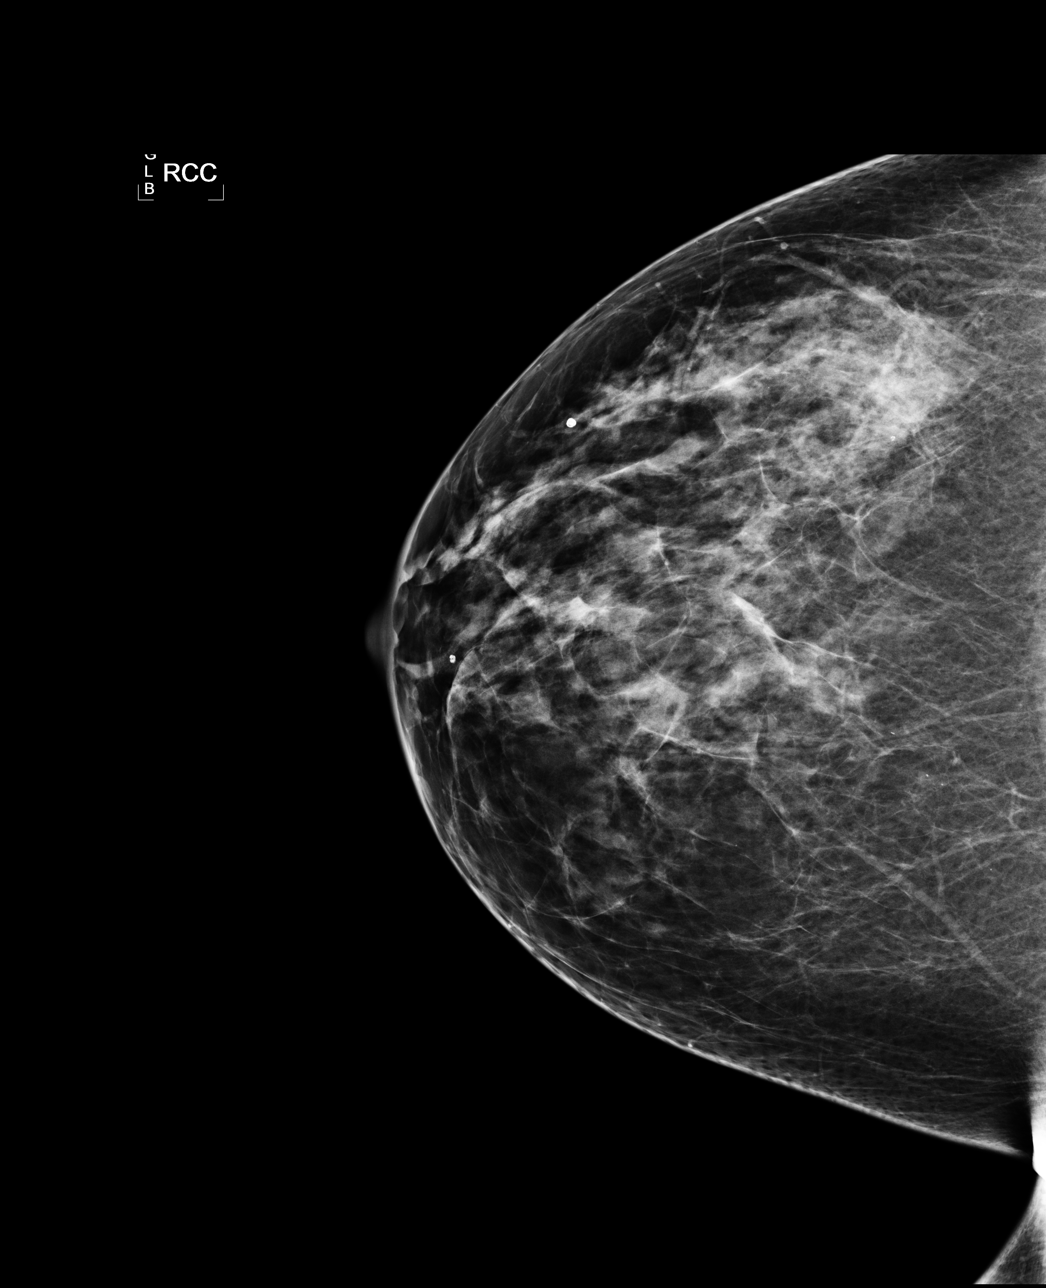

[R MLO]
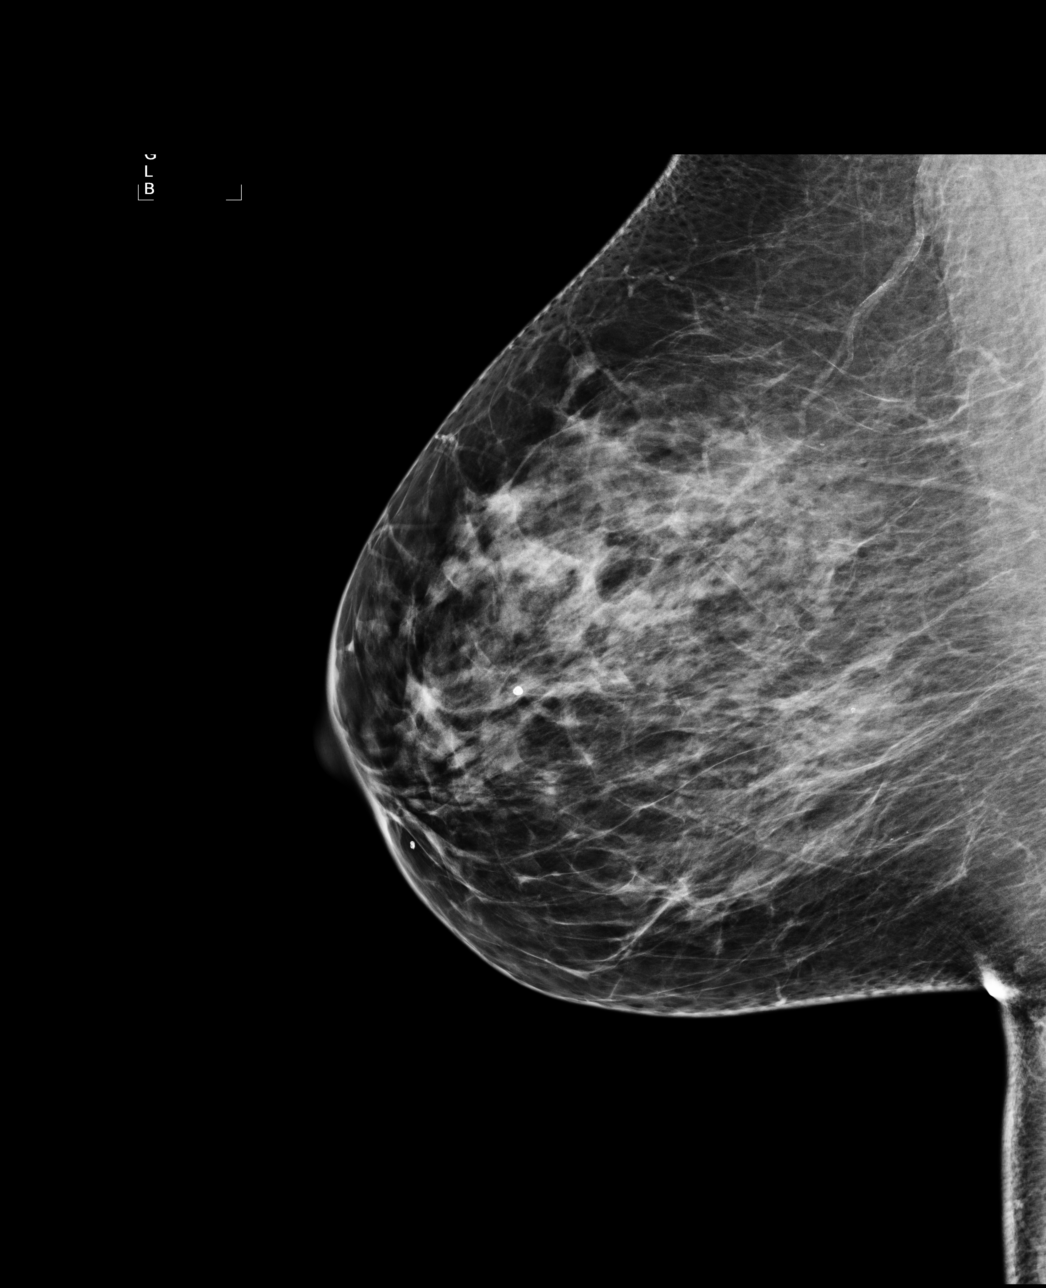

[L CC]
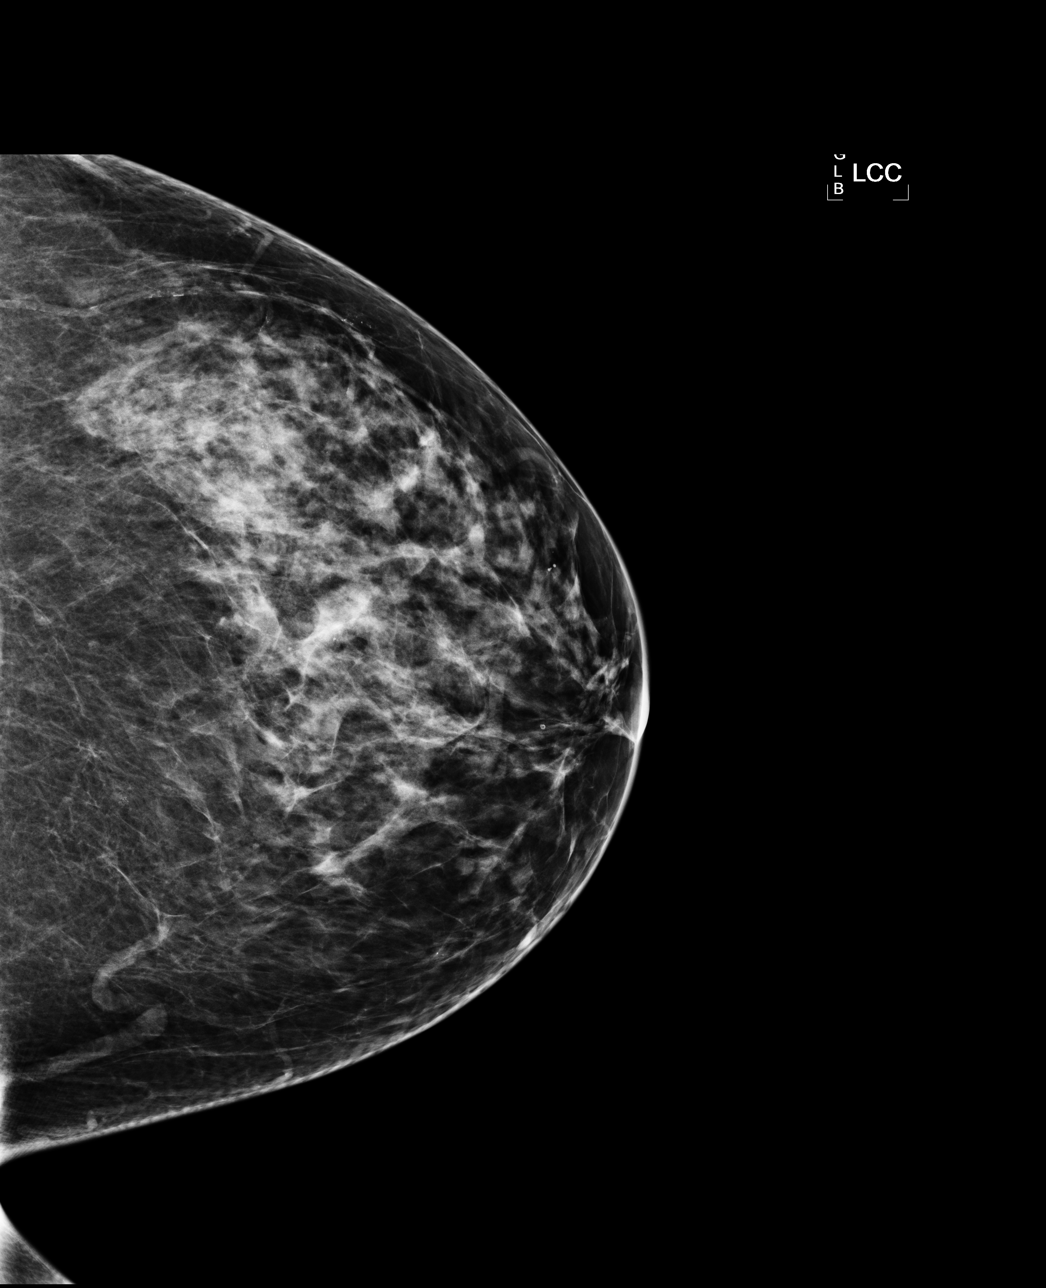

[L MLO]
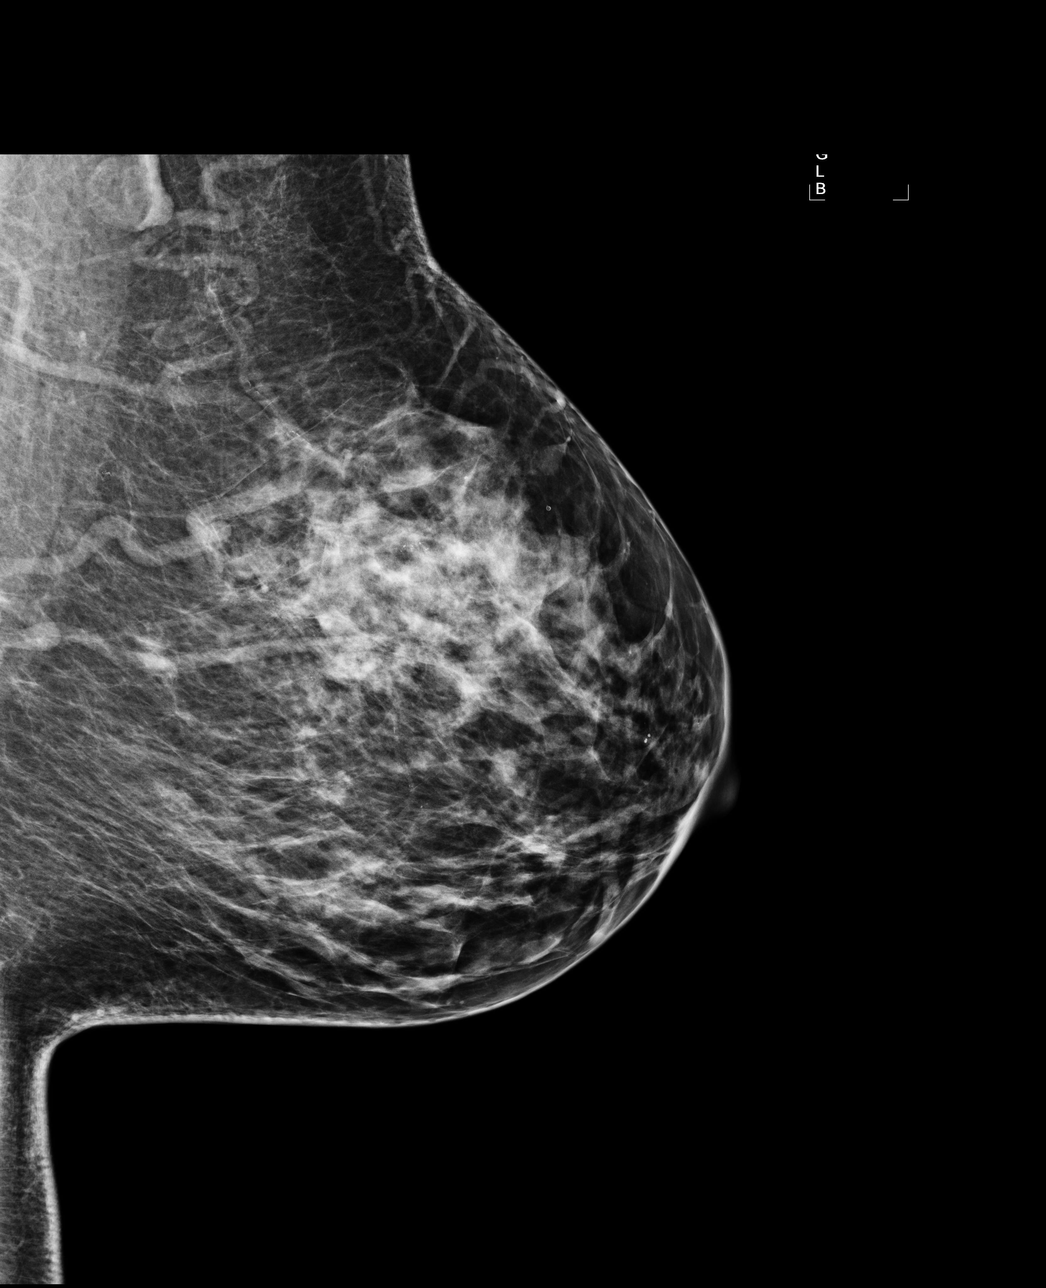

[4 of 4 positions shown; findings below may reference images not displayed]

IMPRESSION: Calcifications, left breast.  Additional evaluation is indicated. The patient will be contacted for
additional studies and a supplementary report will follow.  No specific mammographic evidence of 
malignancy, right breast.

ASSESSMENT: Need additional imaging evaluation and/or prior mammograms for comparison - BI-RADS 0

Further imaging of the left breast.
,

## 2013-12-30 ENCOUNTER — Emergency Department (HOSPITAL_COMMUNITY): Payer: Medicare Other

## 2013-12-30 ENCOUNTER — Inpatient Hospital Stay (HOSPITAL_COMMUNITY)
Admission: EM | Admit: 2013-12-30 | Discharge: 2014-01-05 | DRG: 389 | Disposition: A | Discharge status: Home / Self Care | Payer: Medicare Other | Attending: General Surgery | Admitting: General Surgery

## 2013-12-30 ENCOUNTER — Encounter (HOSPITAL_COMMUNITY): Payer: Self-pay | Admitting: Emergency Medicine

## 2013-12-30 DIAGNOSIS — Z923 Personal history of irradiation: Secondary | ICD-10-CM

## 2013-12-30 DIAGNOSIS — Z432 Encounter for attention to ileostomy: Secondary | ICD-10-CM | POA: Diagnosis not present

## 2013-12-30 DIAGNOSIS — I10 Essential (primary) hypertension: Secondary | ICD-10-CM | POA: Diagnosis present

## 2013-12-30 DIAGNOSIS — Z79899 Other long term (current) drug therapy: Secondary | ICD-10-CM | POA: Diagnosis not present

## 2013-12-30 DIAGNOSIS — K56609 Unspecified intestinal obstruction, unspecified as to partial versus complete obstruction: Secondary | ICD-10-CM | POA: Diagnosis present

## 2013-12-30 DIAGNOSIS — Z853 Personal history of malignant neoplasm of breast: Secondary | ICD-10-CM

## 2013-12-30 DIAGNOSIS — R290 Tetany: Secondary | ICD-10-CM | POA: Diagnosis not present

## 2013-12-30 DIAGNOSIS — E876 Hypokalemia: Secondary | ICD-10-CM | POA: Diagnosis present

## 2013-12-30 DIAGNOSIS — Z86711 Personal history of pulmonary embolism: Secondary | ICD-10-CM

## 2013-12-30 DIAGNOSIS — G4730 Sleep apnea, unspecified: Secondary | ICD-10-CM | POA: Diagnosis present

## 2013-12-30 DIAGNOSIS — Z7982 Long term (current) use of aspirin: Secondary | ICD-10-CM

## 2013-12-30 DIAGNOSIS — R112 Nausea with vomiting, unspecified: Secondary | ICD-10-CM | POA: Diagnosis present

## 2013-12-30 DIAGNOSIS — M129 Arthropathy, unspecified: Secondary | ICD-10-CM | POA: Diagnosis present

## 2013-12-30 DIAGNOSIS — Z9849 Cataract extraction status, unspecified eye: Secondary | ICD-10-CM

## 2013-12-30 LAB — CBC WITH DIFFERENTIAL/PLATELET
BASOS ABS: 0 10*3/uL (ref 0.0–0.1)
BASOS PCT: 0 % (ref 0–1)
Eosinophils Absolute: 0 10*3/uL (ref 0.0–0.7)
Eosinophils Relative: 0 % (ref 0–5)
HCT: 43.1 % (ref 36.0–46.0)
Hemoglobin: 15.2 g/dL — ABNORMAL HIGH (ref 12.0–15.0)
Lymphocytes Relative: 6 % — ABNORMAL LOW (ref 12–46)
Lymphs Abs: 0.6 10*3/uL — ABNORMAL LOW (ref 0.7–4.0)
MCH: 30.4 pg (ref 26.0–34.0)
MCHC: 35.3 g/dL (ref 30.0–36.0)
MCV: 86.2 fL (ref 78.0–100.0)
MONOS PCT: 3 % (ref 3–12)
Monocytes Absolute: 0.3 10*3/uL (ref 0.1–1.0)
NEUTROS ABS: 9.6 10*3/uL — AB (ref 1.7–7.7)
NEUTROS PCT: 91 % — AB (ref 43–77)
Platelets: 267 10*3/uL (ref 150–400)
RBC: 5 MIL/uL (ref 3.87–5.11)
RDW: 12.9 % (ref 11.5–15.5)
WBC: 10.5 10*3/uL (ref 4.0–10.5)

## 2013-12-30 LAB — COMPREHENSIVE METABOLIC PANEL
ALK PHOS: 70 U/L (ref 39–117)
ALT: 14 U/L (ref 0–35)
AST: 22 U/L (ref 0–37)
Albumin: 3.9 g/dL (ref 3.5–5.2)
Anion gap: 22 — ABNORMAL HIGH (ref 5–15)
BILIRUBIN TOTAL: 1.1 mg/dL (ref 0.3–1.2)
BUN: 26 mg/dL — AB (ref 6–23)
CHLORIDE: 96 meq/L (ref 96–112)
CO2: 20 mEq/L (ref 19–32)
Calcium: 10 mg/dL (ref 8.4–10.5)
Creatinine, Ser: 1.26 mg/dL — ABNORMAL HIGH (ref 0.50–1.10)
GFR calc Af Amer: 51 mL/min — ABNORMAL LOW (ref >90)
GFR calc non Af Amer: 44 mL/min — ABNORMAL LOW (ref >90)
Glucose, Bld: 138 mg/dL — ABNORMAL HIGH (ref 70–99)
POTASSIUM: 3.5 meq/L — AB (ref 3.7–5.3)
Sodium: 138 mEq/L (ref 137–147)
Total Protein: 8.4 g/dL — ABNORMAL HIGH (ref 6.0–8.3)

## 2013-12-30 MED ORDER — SODIUM CHLORIDE 0.9 % IV SOLN
1000.0000 mL | INTRAVENOUS | Status: DC
  Administered 2013-12-30: 1000 mL via INTRAVENOUS

## 2013-12-30 MED ORDER — ONDANSETRON HCL 4 MG/2ML IJ SOLN
4.0000 mg | Freq: Four times a day (QID) | INTRAMUSCULAR | Status: DC | PRN
Start: 2013-12-30 — End: 2014-01-05

## 2013-12-30 MED ORDER — DIPHENHYDRAMINE HCL 50 MG/ML IJ SOLN
25.0000 mg | Freq: Once | INTRAMUSCULAR | Status: AC
  Administered 2013-12-30: 25 mg via INTRAVENOUS
  Filled 2013-12-30: qty 1

## 2013-12-30 MED ORDER — KCL IN DEXTROSE-NACL 20-5-0.9 MEQ/L-%-% IV SOLN
INTRAVENOUS | Status: DC
  Administered 2013-12-30 – 2013-12-31 (×2): via INTRAVENOUS
  Administered 2014-01-01: 150 mL/h via INTRAVENOUS
  Administered 2014-01-01: 100 mL/h via INTRAVENOUS
  Administered 2014-01-02 – 2014-01-04 (×4): via INTRAVENOUS
  Filled 2013-12-30 (×16): qty 1000

## 2013-12-30 MED ORDER — MORPHINE SULFATE 4 MG/ML IJ SOLN
4.0000 mg | Freq: Once | INTRAMUSCULAR | Status: AC
  Administered 2013-12-30: 4 mg via INTRAVENOUS
  Filled 2013-12-30: qty 1

## 2013-12-30 MED ORDER — HEPARIN SODIUM (PORCINE) 5000 UNIT/ML IJ SOLN
5000.0000 [IU] | Freq: Three times a day (TID) | INTRAMUSCULAR | Status: DC
  Administered 2013-12-30 – 2014-01-05 (×16): 5000 [IU] via SUBCUTANEOUS
  Filled 2013-12-30 (×20): qty 1

## 2013-12-30 MED ORDER — ONDANSETRON HCL 4 MG/2ML IJ SOLN
4.0000 mg | Freq: Once | INTRAMUSCULAR | Status: AC
  Administered 2013-12-30: 4 mg via INTRAVENOUS
  Filled 2013-12-30: qty 2

## 2013-12-30 MED ORDER — IOHEXOL 300 MG/ML  SOLN
100.0000 mL | Freq: Once | INTRAMUSCULAR | Status: AC | PRN
  Administered 2013-12-30: 100 mL via INTRAVENOUS

## 2013-12-30 MED ORDER — IOHEXOL 300 MG/ML  SOLN
50.0000 mL | Freq: Once | INTRAMUSCULAR | Status: AC | PRN
  Administered 2013-12-30: 50 mL via ORAL

## 2013-12-30 MED ORDER — SODIUM CHLORIDE 0.9 % IV SOLN
1000.0000 mL | Freq: Once | INTRAVENOUS | Status: AC
  Administered 2013-12-30: 1000 mL via INTRAVENOUS

## 2013-12-30 MED ORDER — MORPHINE SULFATE 2 MG/ML IJ SOLN
2.0000 mg | INTRAMUSCULAR | Status: DC | PRN
  Administered 2013-12-31 (×2): 2 mg via INTRAVENOUS
  Filled 2013-12-30 (×2): qty 1
  Filled 2013-12-30: qty 2

## 2013-12-30 MED ORDER — METOCLOPRAMIDE HCL 5 MG/ML IJ SOLN
10.0000 mg | Freq: Once | INTRAMUSCULAR | Status: AC
  Administered 2013-12-30: 10 mg via INTRAVENOUS
  Filled 2013-12-30: qty 2

## 2013-12-30 MED ORDER — PANTOPRAZOLE SODIUM 40 MG IV SOLR
40.0000 mg | Freq: Every day | INTRAVENOUS | Status: DC
  Administered 2013-12-30 – 2014-01-03 (×5): 40 mg via INTRAVENOUS
  Filled 2013-12-30 (×7): qty 40

## 2013-12-30 MED ORDER — ONDANSETRON HCL 4 MG/2ML IJ SOLN: 4.0000 mg | Freq: Once | INTRAMUSCULAR | Status: DC

## 2013-12-30 NOTE — ED Notes (Signed)
Pt has had ileostomy x 42 years.  Pt has had episodes of bowels getting blocked.  States after dinner last night, pt having abdominal pain and n/v.  Pt states small liquid in tube but decrease in output.

## 2013-12-30 NOTE — Progress Notes (Signed)
Inserted Nasrogastric tube in right nostril. Patient produced green emesis as tube was advancing. 200 cc of emesis was recorded. Patient tolerated procedure well. Will continue to monitor patient.

## 2013-12-30 NOTE — H&P (Signed)
Deanna Burke is an 63 y.o. female.   Chief Complaint: abdominal pain, vomiting  HPI: patient is a 63 year old female who presents with the acute onset of abdominal pain, nausea and vomiting. She felt well until 24 hours ago when after dinner she had the gradual onset of pressure-like pain initially in her epigastrium and radiating out through her abdomen. She developed nausea and vomiting. Today she had persistent pain which is diffuse and aching and somewhat intermittent. This has been associated with several episodes of vomiting which she describes as brown. The patient has an end ileostomy with a significant history of total abdominal colectomy for ulcerative colitis in the 1970s. In 1996 she had laparotomy and small bowel resection for perforated small bowel with abscess. She was hospitalized for small bowel obstruction in 2013 that resolved without surgery. She is noted significantly decreased output from her ileostomy over the past 24 hours. The symptoms are the same as her previous bowel obstruction 2 years ago and she presented to the emergency department for evaluation. No fever or chills. No urinary symptoms.  Past Medical History  Diagnosis Date  . Ulcerative colitis [id number]  . Pulmonary embolus 1971  . Phlebitis 1971  . Small bowel obstruction [id number]  . Small bowel perforation 1971/1996  . Large bowel perforation 1971/1996  . Scoliosis   . Torn rotator cuff 2005  . History of bone density study 2012  . Fatigue   . Cataract     Left Eye  . Hot flashes   . 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 Hypertension   . Cancer     DCIS L breast  . H/O ulcerative colitis   . Breast cancer   . Arthritis     hands, lumbar spine, hips, right ankle  . S/P ileostomy   . S/P radiation therapy 07/24/11 - 09/06/11    Left Breast/ 5000 cGy in 25 Fractions with Boost of 1000 cGy in 5 Fractions  . 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 Abnormal finding on cardiovascular stress test, ischemia anterolateral 02/13/2012    follow heart cath - pt told one tiny blockage that didn't even matter  . Syncope, ? anginal equivilant 02/13/2012  . Ileostomy in place, secondary to ulcerative colitis x 20 years 02/13/2012  . History of recurrent UTIs 02/13/2012  . Sleep apnea 2009    uses cpap-setting is 1  . H/O nephrostomy 08/13/12    Past Surgical History  Procedure Laterality Date  . Colectomy  1973  . Ileostomy  1973  . Vaginoplasty  1975  . Exploratory laparotomy  1978/1990    with lysis of adhesions  . Eye surgery  05/08/2011    left cataract removal   . Appendectomy    . Rotator cuff repair  2006    Right  . Breast surgery  05/03/11    LEFT BREAST NEEDLE CORE BIOPSY- DCIS  . 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  ER:+, PR+,   . Abdominal hysterectomy  1996    partial  . Exploratory laps      several due to abd pain related to ulcerative colitis  . Cardiac catheterization    . Laparoscopic nephrectomy Right 08/14/2012    Procedure: RIGHT LAPAROSCOPIC RETROPERITONEAL LAPAROSCOPIC NEPHRECTOMY ;  Surgeon: Alexis Frock,  MD;  Location: [facility];  Service: Urology;  Laterality: Right;  RIGHT LAPAROSCOPIC RETROPERITONEAL LAPAROSCOPIC NEPHRECTOMY, POSSIBLE OPEN     Family History  Problem Relation Age of Onset  . Breast cancer Maternal Grandmother   . Stomach cancer Maternal Grandfather   . Cancer Paternal Grandmother     stomach  . Cancer Other     Breast  . Atrial fibrillation Mother   . Heart attack Father   . Atrial fibrillation Brother   . Heart attack Paternal Uncle    Social History:  reports that she has never smoked. She has never used smokeless tobacco. She reports that she does not drink alcohol or use illicit drugs.  Allergies:  Allergies  Allergen Reactions  .  Demerol Nausea And Vomiting  . Dilaudid [Hydromorphone Hcl] Nausea And Vomiting and Other (See Comments)    Reaction: muscle flexion and cramping also with nausea and vomiting  . Stadol [Butorphanol Tartrate] Nausea And Vomiting  . Cephalexin Rash  . Penicillins Rash     Current Facility-Administered Medications  Medication Dose Route Frequency Provider Last Rate Last Dose  . 0.9 %  sodium chloride infusion  1,000 mL Intravenous Continuous Janice Norrie, MD      . ondansetron (ZOFRAN) injection 4 mg  4 mg Intravenous Once Janice Norrie, MD       Current Outpatient Prescriptions  Medication Sig Dispense Refill  . acetaminophen (TYLENOL) 500 MG tablet Take 1,000 mg by mouth every 6 (six) hours as needed for moderate pain.      [name] aspirin 81 MG tablet Take 81 mg by mouth daily.      . Bismuth Subgallate (DEVROM) 200 MG CHEW Chew 400 mg by mouth 4 (four) times daily. For odor control - pt has ileostomy      . diclofenac sodium (VOLTAREN) 1 % GEL Apply 2 g topically 4 (four) times daily as needed (for pain). For shoulder/ joint pain      . Multiple Vitamins-Minerals (MULTIVITAMIN WITH MINERALS) tablet Take 1 tablet by mouth daily.      [name] oxybutynin (DITROPAN) 5 MG tablet Take 5 mg by mouth every 8 (eight) hours as needed for bladder spasms.       . potassium chloride SA (K-DUR,KLOR-CON) 20 MEQ tablet Take 1 tablet (20 mEq total) by mouth daily.  90 tablet  1  . tamoxifen (NOLVADEX) 20 MG tablet Take 1 tablet (20 mg total) by mouth daily.  90 tablet  3  . triamterene-hydrochlorothiazide (MAXZIDE-25) 37.5-25 MG per tablet Take 1 tablet by mouth every morning.         Results for orders placed during the hospital encounter of 12/30/13 (from the past 48 hour(s))  CBC WITH DIFFERENTIAL     Status: Abnormal   Collection Time    12/30/13 11:06 AM      Result Value Ref Range   WBC 10.5  4.0 - 10.5 K/uL   RBC 5.00  3.87 - 5.11 MIL/uL   Hemoglobin 15.2 (*) 12.0 - 15.0 g/dL   HCT 43.1  36.0 - 46.0 %    MCV 86.2  78.0 - 100.0 fL   MCH 30.4  26.0 - 34.0 pg   MCHC 35.3  30.0 - 36.0 g/dL   RDW 12.9  11.5 - 15.5 %   Platelets 267  150 - 400 K/uL   Neutrophils Relative % 91 (*) 43 - 77 %   Neutro Abs 9.6 (*) 1.7 - 7.7 K/uL   Lymphocytes Relative  6 (*) 12 - 46 %   Lymphs Abs 0.6 (*) 0.7 - 4.0 K/uL   Monocytes Relative 3  3 - 12 %   Monocytes Absolute 0.3  0.1 - 1.0 K/uL   Eosinophils Relative 0  0 - 5 %   Eosinophils Absolute 0.0  0.0 - 0.7 K/uL   Basophils Relative 0  0 - 1 %   Basophils Absolute 0.0  0.0 - 0.1 K/uL  COMPREHENSIVE METABOLIC PANEL     Status: Abnormal   Collection Time    12/30/13 11:06 AM      Result Value Ref Range   Sodium 138  137 - 147 mEq/L   Comment: REPEATED TO VERIFY   Potassium 3.5 (*) 3.7 - 5.3 mEq/L   Chloride 96  96 - 112 mEq/L   Comment: REPEATED TO VERIFY   CO2 20  19 - 32 mEq/L   Comment: REPEATED TO VERIFY   Glucose, Bld 138 (*) 70 - 99 mg/dL   BUN 26 (*) 6 - 23 mg/dL   Creatinine, Ser 1.26 (*) 0.50 - 1.10 mg/dL   Calcium 10.0  8.4 - 10.5 mg/dL   Total Protein 8.4 (*) 6.0 - 8.3 g/dL   Albumin 3.9  3.5 - 5.2 g/dL   AST 22  0 - 37 U/L   ALT 14  0 - 35 U/L   Alkaline Phosphatase 70  39 - 117 U/L   Total Bilirubin 1.1  0.3 - 1.2 mg/dL   GFR calc non Af Amer 44 (*) >90 mL/min   GFR calc Af Amer 51 (*) >90 mL/min   Comment: (NOTE)     The eGFR has been calculated using the CKD EPI equation.     This calculation has not been validated in all clinical situations.     eGFR's persistently <90 mL/min signify possible Chronic Kidney     Disease.   Anion gap 22 (*) 5 - 15   Comment: REPEATED TO VERIFY   Ct Abdomen Pelvis W Contrast  12/30/2013   CLINICAL DATA:  Abdominal pain  EXAM: CT ABDOMEN AND PELVIS WITH CONTRAST  TECHNIQUE: Multidetector CT imaging of the abdomen and pelvis was performed using the standard protocol following bolus administration of intravenous contrast.  CONTRAST:  80m OMNIPAQUE IOHEXOL 300 MG/ML SOLN, 1051mOMNIPAQUE IOHEXOL  300 MG/ML SOLN  COMPARISON:  01/27/2013  FINDINGS: Lung bases are free of acute infiltrate or sizable effusion.  The liver, gallbladder, spleen, left adrenal gland and pancreas are within normal limits. The left kidney is within normal limits as well. The right kidney is been surgically removed as has the right adrenal gland.  There multiple dilated loops of small bowel identified in the mid abdomen which start in the first portion of the jejunum. Postsurgical changes are seen. A definitive transition zone is not visualized however the loop in the ileostomy is of normal caliber. These changes are likely due to adhesions. No free fluid is noted. The bladder is well distended. No acute bony abnormality is noted.  IMPRESSION: Changes consistent with small bowel obstruction from the early jejunum into the mid abdomen. A definitive transition zone is not elicited however the distal most portion of the small bowel in the ileostomy is of normal caliber. These changes are likely related to postsurgical adhesions.  Status post right nephrectomy.   Electronically Signed   By: MaInez Catalina.D.   On: 12/30/2013 14:16   Dg Abd Acute W/chest  12/30/2013   CLINICAL DATA:  Abdominal pain. Nausea and vomiting. Symptom onset last evening.  EXAM: ACUTE ABDOMEN SERIES (ABDOMEN 2 VIEW & CHEST 1 VIEW)  COMPARISON:  CT 01/27/2013.  FINDINGS: Mild LEFT basilar atelectasis or scarring. No free air underneath the hemidiaphragms. Resection of the distal RIGHT clavicle associated with prior rotator cuff repair.  Multiple air-fluid levels are present in small bowel in the LEFT mid abdomen. Levoconvex lumbar curvature is present. Small bowel loops are mildly dilated, measuring 38 mm. Bowel staples are present in the LEFT mid abdomen. Patient status post colectomy with ileostomy. RIGHT nephrectomy.  IMPRESSION: Small bowel air-fluid levels and mild dilation in the RIGHT mid abdomen, compatible with recurrent small bowel obstruction.    Electronically Signed   By: Dereck Ligas M.D.   On: 12/30/2013 11:52    Review of Systems  Constitutional: Negative for fever, chills, weight loss and malaise/fatigue.  HENT: Negative.   Respiratory: Negative.   Cardiovascular: Negative.   Gastrointestinal: Positive for nausea, vomiting and abdominal pain. Negative for heartburn and blood in stool.  Genitourinary: Negative.   Musculoskeletal: Positive for back pain and joint pain.  Neurological: Negative.     Blood pressure 98/38, pulse 98, temperature 98.7 F (37.1 C), temperature source Oral, resp. rate 19, SpO2 99.00%. Physical Exam  General: Alert, mildly obese Caucasian female, in no distress Skin: Warm and dry without rash or infection. HEENT: No palpable masses or thyromegaly. Sclera nonicteric. Pupils equal round and reactive. Oropharynx clear. Lymph nodes: No cervical, supraclavicular, or inguinal nodes palpable. Breasts: Well-healed lumpectomy left breast. No palpable masses. No skin changes or nipple inversion. Lungs: Breath sounds clear and equal without increased work of breathing Cardiovascular: Regular rate and rhythm without murmur. No JVD or edema. Peripheral pulses intact. Abdomen: moderately distended. A few bowel sounds. Well-healed midline incision. No palpable hernias. Tympanitic. Mild to moderate diffuse tenderness without guarding. Ileostomy present in the right mid abdomen with a small amount of stool and gas in the bag. No discernible masses or organomegaly. Extremities: No edema or joint swelling or deformity. No chronic venous stasis changes. Neurologic: Alert and fully oriented. Gait normal.  Assessment/Plan Patient with history of multiple previous abdominal operations as above and history of previous bowel obstruction. She now presents with findings and CT scan consistent with acute small bowel obstruction. She will be admitted and NG tube placed. IV hydration. Close clinical followup with repeat  abdominal x-rays tomorrow morning. Remote history of pulmonary embolus-DVT prophylaxis with heparin  Fathima Bartl T 12/30/2013, 4:30 PM

## 2013-12-30 NOTE — ED Provider Notes (Signed)
CSN: [id number]     Arrival date & time 12/30/13  1025 History   First MD Initiated Contact with Patient 12/30/13 1105     Chief Complaint  Patient presents with  . Abdominal Pain  . Emesis     (Consider location/radiation/quality/duration/timing/severity/associated sxs/prior Treatment) HPI Patient reports she has had a ileostomy for about 42 years. She has had episodes of partial and small bowel obstructions in the past. Her last surgery because of the blockage was in 1990. She states she ate supper yesterday which was fried chicken and potato salad. Later on the night she started getting abdominal distention and pain, nausea, and vomiting. She reports decreased output in her ileostomy bag and she states this feels like when she has had her partial bowel obstructions in the past. She denies any fever.  PCP Dr Holley Raring Surgeon Dr Excell Seltzer Urologist Dr Tresa Moore  Past Medical History  Diagnosis Date  . Ulcerative colitis [phone]  . Pulmonary embolus 1971  . Phlebitis 1971  . Small bowel obstruction [id number]  . Small bowel perforation 1971/1996  . Large bowel perforation 1971/1996  . Scoliosis   . Torn rotator cuff 2005  . History of bone density study 2012  . Fatigue   . Cataract     Left Eye  . Hot flashes   . 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 Hypertension   . Cancer     DCIS L breast  . H/O ulcerative colitis   . Breast cancer   . Arthritis     hands, lumbar spine, hips, right ankle  . S/P ileostomy   . S/P radiation therapy 07/24/11 - 09/06/11    Left Breast/ 5000 cGy in 25 Fractions with Boost of 1000 cGy in 5 Fractions  . 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 Abnormal finding on cardiovascular stress test, ischemia anterolateral 02/13/2012    follow heart cath - pt told one tiny blockage that didn't even matter  . Syncope, ? anginal equivilant 02/13/2012  . Ileostomy in  place, secondary to ulcerative colitis x 20 years 02/13/2012  . History of recurrent UTIs 02/13/2012  . Sleep apnea 2009    uses cpap-setting is 1  . H/O nephrostomy 08/13/12   Past Surgical History  Procedure Laterality Date  . Colectomy  1973  . Ileostomy  1973  . Vaginoplasty  1975  . Exploratory laparotomy  1978/1990    with lysis of adhesions  . Eye surgery  05/08/2011    left cataract removal   . Appendectomy    . Rotator cuff repair  2006    Right  . Breast surgery  05/03/11    LEFT BREAST NEEDLE CORE BIOPSY- DCIS  . 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  ER:+, PR+,   . Abdominal hysterectomy  1996    partial  . Exploratory laps      several due to abd pain related to ulcerative colitis  . Cardiac catheterization    . Laparoscopic nephrectomy Right 08/14/2012    Procedure: RIGHT LAPAROSCOPIC RETROPERITONEAL LAPAROSCOPIC NEPHRECTOMY ;  Surgeon: Alexis Frock, MD;  Location: [facility];  Service: Urology;  Laterality: Right;  RIGHT LAPAROSCOPIC RETROPERITONEAL LAPAROSCOPIC NEPHRECTOMY, POSSIBLE OPEN    Family History  Problem Relation Age of Onset  . Breast cancer Maternal Grandmother   .  Stomach cancer Maternal Grandfather   . Cancer Paternal Grandmother     stomach  . Cancer Other     Breast  . Atrial fibrillation Mother   . Heart attack Father   . Atrial fibrillation Brother   . Heart attack Paternal Uncle    History  Substance Use Topics  . Smoking status: Never Smoker   . Smokeless tobacco: Never Used  . Alcohol Use: No   Retired Marine scientist  OB History   Grav Para Term Preterm Abortions TAB SAB Ect Mult Living                 Review of Systems  All other systems reviewed and are negative.     Allergies  Demerol; Dilaudid; Stadol; Cephalexin; and Penicillins  Home Medications   Prior to Admission medications   Medication Sig Start Date End Date  Taking? Authorizing Provider  acetaminophen (TYLENOL) 500 MG tablet Take 1,000 mg by mouth every 6 (six) hours as needed for moderate pain.   Yes Historical Provider, MD  aspirin 81 MG tablet Take 81 mg by mouth daily.   Yes Historical Provider, MD  Bismuth Subgallate (DEVROM) 200 MG CHEW Chew 400 mg by mouth 4 (four) times daily. For odor control - pt has ileostomy   Yes Historical Provider, MD  diclofenac sodium (VOLTAREN) 1 % GEL Apply 2 g topically 4 (four) times daily as needed (for pain). For shoulder/ joint pain   Yes Historical Provider, MD  Multiple Vitamins-Minerals (MULTIVITAMIN WITH MINERALS) tablet Take 1 tablet by mouth daily.   Yes Historical Provider, MD  oxybutynin (DITROPAN) 5 MG tablet Take 5 mg by mouth every 8 (eight) hours as needed for bladder spasms.  09/01/13  Yes Historical Provider, MD  potassium chloride SA (K-DUR,KLOR-CON) 20 MEQ tablet Take 1 tablet (20 mEq total) by mouth daily. 10/06/13  Yes Amy Milda Smart, PA-C  tamoxifen (NOLVADEX) 20 MG tablet Take 1 tablet (20 mg total) by mouth daily. 10/06/13  Yes Chauncey Cruel, MD  triamterene-hydrochlorothiazide (MAXZIDE-25) 37.5-25 MG per tablet Take 1 tablet by mouth every morning.   Yes Historical Provider, MD   BP 103/58  Pulse 108  Temp(Src) 98.7 F (37.1 C) (Oral)  Resp 20  SpO2 100%  Vital signs normal except tachycardia  Physical Exam  Nursing note and vitals reviewed. Constitutional: She is oriented to person, place, and time. She appears well-developed and well-nourished.  Non-toxic appearance. She does not appear ill. She appears distressed.  HENT:  Head: Normocephalic and atraumatic.  Right Ear: External ear normal.  Left Ear: External ear normal.  Nose: Nose normal. No mucosal edema or rhinorrhea.  Mouth/Throat: Oropharynx is clear and moist and mucous membranes are normal. No dental abscesses or uvula swelling.  Eyes: Conjunctivae and EOM are normal. Pupils are equal, round, and reactive to light.   Neck: Normal range of motion and full passive range of motion without pain. Neck supple.  Cardiovascular: Normal rate, regular rhythm and normal heart sounds.  Exam reveals no gallop and no friction rub.   No murmur heard. Pulmonary/Chest: Effort normal and breath sounds normal. No respiratory distress. She has no wheezes. She has no rhonchi. She has no rales. She exhibits no tenderness and no crepitus.  Abdominal: Soft. Normal appearance and bowel sounds are normal. She exhibits distension. There is no tenderness. There is no rebound and no guarding.  Hyperactive bowel sounds, ileostomy in her right midabdomen, her lower abdomen is very distended and tight  Musculoskeletal:  Normal range of motion. She exhibits no edema and no tenderness.  Moves all extremities well.   Neurological: She is alert and oriented to person, place, and time. She has normal strength. No cranial nerve deficit.  Skin: Skin is warm, dry and intact. No rash noted. No erythema. No pallor.  Psychiatric: She has a normal mood and affect. Her speech is normal and behavior is normal. Her mood appears not anxious.    ED Course  Procedures (including critical care time)  Medications  0.9 %  sodium chloride infusion (0 mLs Intravenous Stopped 12/30/13 1528)    Followed by  0.9 %  sodium chloride infusion (1,000 mLs Intravenous New Bag/Given 12/30/13 1322)    Followed by  0.9 %  sodium chloride infusion (not administered)  ondansetron (ZOFRAN) injection 4 mg (4 mg Intravenous Not Given 12/30/13 1153)  ondansetron (ZOFRAN) injection 4 mg (4 mg Intravenous Given 12/30/13 1108)  morphine 4 MG/ML injection 4 mg (4 mg Intravenous Given 12/30/13 1153)  metoCLOPramide (REGLAN) injection 10 mg (10 mg Intravenous Given 12/30/13 1153)  diphenhydrAMINE (BENADRYL) injection 25 mg (25 mg Intravenous Given 12/30/13 1152)  iohexol (OMNIPAQUE) 300 MG/ML solution 50 mL (50 mLs Oral Contrast Given 12/30/13 1251)  iohexol (OMNIPAQUE) 300 MG/ML solution 100  mL (100 mLs Intravenous Contrast Given 12/30/13 1353)     12:35 Pt given her xray results and is agreeable to get CT scan.   14:45 pt given her CT results. She does not want a NG at this time despite just vomiting. 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   15:38 Dr Excell Seltzer will see patient in the ED.   Dr Excell Seltzer admitted patient.   Labs Review Results for orders placed during the hospital encounter of 12/30/13  CBC WITH DIFFERENTIAL      Result Value Ref Range   WBC 10.5  4.0 - 10.5 K/uL   RBC 5.00  3.87 - 5.11 MIL/uL   Hemoglobin 15.2 (*) 12.0 - 15.0 g/dL   HCT 43.1  36.0 - 46.0 %   MCV 86.2  78.0 - 100.0 fL   MCH 30.4  26.0 - 34.0 pg   MCHC 35.3  30.0 - 36.0 g/dL   RDW 12.9  11.5 - 15.5 %   Platelets 267  150 - 400 K/uL   Neutrophils Relative % 91 (*) 43 - 77 %   Neutro Abs 9.6 (*) 1.7 - 7.7 K/uL   Lymphocytes Relative 6 (*) 12 - 46 %   Lymphs Abs 0.6 (*) 0.7 - 4.0 K/uL   Monocytes Relative 3  3 - 12 %   Monocytes Absolute 0.3  0.1 - 1.0 K/uL   Eosinophils Relative 0  0 - 5 %   Eosinophils Absolute 0.0  0.0 - 0.7 K/uL   Basophils Relative 0  0 - 1 %   Basophils Absolute 0.0  0.0 - 0.1 K/uL  COMPREHENSIVE METABOLIC PANEL      Result Value Ref Range   Sodium 138  137 - 147 mEq/L   Potassium 3.5 (*) 3.7 - 5.3 mEq/L   Chloride 96  96 - 112 mEq/L   CO2 20  19 - 32 mEq/L   Glucose, Bld 138 (*) 70 - 99 mg/dL   BUN 26 (*) 6 - 23 mg/dL   Creatinine, Ser 1.26 (*) 0.50 - 1.10 mg/dL   Calcium 10.0  8.4 - 10.5 mg/dL  Total Protein 8.4 (*) 6.0 - 8.3 g/dL   Albumin 3.9  3.5 - 5.2 g/dL   AST 22  0 - 37 U/L   ALT 14  0 - 35 U/L   Alkaline Phosphatase 70  39 - 117 U/L   Total Bilirubin 1.1  0.3 - 1.2 mg/dL   GFR calc non Af Amer 44 (*) >90 mL/min   GFR calc Af Amer 51 (*) >90 mL/min   Anion gap 22 (*) 5 - 15   Laboratory interpretation all normal except renal insufficienty     Imaging Review Ct  Abdomen Pelvis W Contrast  12/30/2013   CLINICAL DATA:  Abdominal pain  EXAM: CT ABDOMEN AND PELVIS WITH CONTRAST  TECHNIQUE: Multidetector CT imaging of the abdomen and pelvis was performed using the standard protocol following bolus administration of intravenous contrast.  CONTRAST:  69m OMNIPAQUE IOHEXOL 300 MG/ML SOLN, 1019mOMNIPAQUE IOHEXOL 300 MG/ML SOLN  COMPARISON:  01/27/2013  FINDINGS: Lung bases are free of acute infiltrate or sizable effusion.  The liver, gallbladder, spleen, left adrenal gland and pancreas are within normal limits. The left kidney is within normal limits as well. The right kidney is been surgically removed as has the right adrenal gland.  There multiple dilated loops of small bowel identified in the mid abdomen which start in the first portion of the jejunum. Postsurgical changes are seen. A definitive transition zone is not visualized however the loop in the ileostomy is of normal caliber. These changes are likely due to adhesions. No free fluid is noted. The bladder is well distended. No acute bony abnormality is noted.  IMPRESSION: Changes consistent with small bowel obstruction from the early jejunum into the mid abdomen. A definitive transition zone is not elicited however the distal most portion of the small bowel in the ileostomy is of normal caliber. These changes are likely related to postsurgical adhesions.  Status post right nephrectomy.   Electronically Signed   By: MaInez Catalina.D.   On: 12/30/2013 14:16   Dg Abd Acute W/chest  12/30/2013   CLINICAL DATA:  Abdominal pain. Nausea and vomiting. Symptom onset last evening.  EXAM: ACUTE ABDOMEN SERIES (ABDOMEN 2 VIEW & CHEST 1 VIEW)  COMPARISON:  CT 01/27/2013.  FINDINGS: Mild LEFT basilar atelectasis or scarring. No free air underneath the hemidiaphragms. Resection of the distal RIGHT clavicle associated with prior rotator cuff repair.  Multiple air-fluid levels are present in small bowel in the LEFT mid abdomen.  Levoconvex lumbar curvature is present. Small bowel loops are mildly dilated, measuring 38 mm. Bowel staples are present in the LEFT mid abdomen. Patient status post colectomy with ileostomy. RIGHT nephrectomy.  IMPRESSION: Small bowel air-fluid levels and mild dilation in the RIGHT mid abdomen, compatible with recurrent small bowel obstruction.   Electronically Signed   By: GeDereck Ligas.D.   On: 12/30/2013 11:52     EKG Interpretation None      MDM   Final diagnoses:  SBO (small bowel obstruction)    Plan admission  IvRolland PorterMD, FAAlanson AlyMD 12/30/13 [id number]

## 2013-12-31 ENCOUNTER — Inpatient Hospital Stay (HOSPITAL_COMMUNITY): Payer: Medicare Other

## 2013-12-31 LAB — COMPREHENSIVE METABOLIC PANEL
ALBUMIN: 2.9 g/dL — AB (ref 3.5–5.2)
ALT: 16 U/L (ref 0–35)
ANION GAP: 12 (ref 5–15)
AST: 21 U/L (ref 0–37)
Alkaline Phosphatase: 53 U/L (ref 39–117)
BUN: 17 mg/dL (ref 6–23)
CHLORIDE: 104 meq/L (ref 96–112)
CO2: 26 mEq/L (ref 19–32)
CREATININE: 1.11 mg/dL — AB (ref 0.50–1.10)
Calcium: 8.4 mg/dL (ref 8.4–10.5)
GFR calc Af Amer: 59 mL/min — ABNORMAL LOW (ref >90)
GFR calc non Af Amer: 51 mL/min — ABNORMAL LOW (ref >90)
GLUCOSE: 110 mg/dL — AB (ref 70–99)
Potassium: 3.6 mEq/L — ABNORMAL LOW (ref 3.7–5.3)
Sodium: 142 mEq/L (ref 137–147)
Total Bilirubin: 1.1 mg/dL (ref 0.3–1.2)
Total Protein: 6.7 g/dL (ref 6.0–8.3)

## 2013-12-31 LAB — CBC
HEMATOCRIT: 37.6 % (ref 36.0–46.0)
HEMOGLOBIN: 12.5 g/dL (ref 12.0–15.0)
MCH: 29.8 pg (ref 26.0–34.0)
MCHC: 33.2 g/dL (ref 30.0–36.0)
MCV: 89.5 fL (ref 78.0–100.0)
Platelets: 220 10*3/uL (ref 150–400)
RBC: 4.2 MIL/uL (ref 3.87–5.11)
RDW: 13.4 % (ref 11.5–15.5)
WBC: 3.3 10*3/uL — AB (ref 4.0–10.5)

## 2013-12-31 LAB — BASIC METABOLIC PANEL
ANION GAP: 13 (ref 5–15)
BUN: 19 mg/dL (ref 6–23)
CALCIUM: 8.4 mg/dL (ref 8.4–10.5)
CHLORIDE: 102 meq/L (ref 96–112)
CO2: 27 mEq/L (ref 19–32)
CREATININE: 1.24 mg/dL — AB (ref 0.50–1.10)
GFR calc Af Amer: 52 mL/min — ABNORMAL LOW (ref >90)
GFR calc non Af Amer: 45 mL/min — ABNORMAL LOW (ref >90)
GLUCOSE: 153 mg/dL — AB (ref 70–99)
Potassium: 3.3 mEq/L — ABNORMAL LOW (ref 3.7–5.3)
Sodium: 142 mEq/L (ref 137–147)

## 2013-12-31 LAB — CALCIUM, IONIZED: CALCIUM ION: 1.14 mmol/L (ref 1.13–1.30)

## 2013-12-31 MED ORDER — POTASSIUM CHLORIDE 10 MEQ/100ML IV SOLN
10.0000 meq | INTRAVENOUS | Status: AC
  Administered 2013-12-31 (×4): 10 meq via INTRAVENOUS
  Filled 2013-12-31 (×4): qty 100

## 2013-12-31 NOTE — Progress Notes (Signed)
[facility] Progress Note     Subjective: Patient has some flatus/ stool in ileostomy bag, but she states that it was there yesterday prior to admission.  No further ileostomy output.  Objective: Vital signs in last 24 hours: Temp:  [97.8 F (36.6 C)-98.7 F (37.1 C)] 97.8 F (36.6 C) (09/09 0457) Pulse Rate:  [80-108] 86 (09/09 0457) Resp:  [14-20] 16 (09/09 0457) BP: (98-130)/(38-92) 128/92 mmHg (09/09 0457) SpO2:  [97 %-100 %] 99 % (09/09 0457) Weight:  [160 lb (72.576 kg)] 160 lb (72.576 kg) (09/08 1815) Last BM Date: 12/30/13  Intake/Output from previous day: 09/08 0701 - 09/09 0700 In: 1391.7 [I.V.:1391.7] Out: 2300 [Urine:500; Emesis/NG output:1800] Intake/Output this shift:    PE: Gen:  Alert, NAD, pleasant Card:  RRR, no M/G/R heard Pulm:  CTA, no W/R/R Abd: soft, minimal distention.  Multiple surgical scars.  Right sided ileostomy - pink, no palpable hernia; old flatus/ stool in bag Ext:  No erythema, edema, or tenderness   Lab Results:   Recent Labs  12/30/13 1106 12/31/13 0450  WBC 10.5 3.3*  HGB 15.2* 12.5  HCT 43.1 37.6  PLT 267 220   BMET  Recent Labs  12/30/13 1106 12/31/13 0450  NA 138 142  K 3.5* 3.3*  CL 96 102  CO2 20 27  GLUCOSE 138* 153*  BUN 26* 19  CREATININE 1.26* 1.24*  CALCIUM 10.0 8.4   PT/INR No results found for this basename: LABPROT, INR,  in the last 72 hours CMP     Component Value Date/Time   NA 142 12/31/2013 0450   NA 141 10/06/2013 1239   K 3.3* 12/31/2013 0450   K 3.6 10/06/2013 1239   CL 102 12/31/2013 0450   CL 105 10/09/2012 1103   CO2 27 12/31/2013 0450   CO2 26 10/06/2013 1239   GLUCOSE 153* 12/31/2013 0450   GLUCOSE 89 10/06/2013 1239   GLUCOSE 117* 10/09/2012 1103   BUN 19 12/31/2013 0450   BUN 23.7 10/06/2013 1239   CREATININE 1.24* 12/31/2013 0450   CREATININE 1.3* 10/06/2013 1239   CALCIUM 8.4 12/31/2013 0450   CALCIUM 10.6* 10/06/2013 1239   PROT 8.4* 12/30/2013 1106   PROT 8.0 10/06/2013 1239    ALBUMIN 3.9 12/30/2013 1106   ALBUMIN 3.8 10/06/2013 1239   AST 22 12/30/2013 1106   AST 19 10/06/2013 1239   ALT 14 12/30/2013 1106   ALT 15 10/06/2013 1239   ALKPHOS 70 12/30/2013 1106   ALKPHOS 68 10/06/2013 1239   BILITOT 1.1 12/30/2013 1106   BILITOT 0.82 10/06/2013 1239   GFRNONAA 45* 12/31/2013 0450   GFRAA 52* 12/31/2013 0450   Lipase     Component Value Date/Time   LIPASE 33 06/05/2012 1455       Studies/Results: Ct Abdomen Pelvis W Contrast  12/30/2013   CLINICAL DATA:  Abdominal pain  EXAM: CT ABDOMEN AND PELVIS WITH CONTRAST  TECHNIQUE: Multidetector CT imaging of the abdomen and pelvis was performed using the standard protocol following bolus administration of intravenous contrast.  CONTRAST:  68m OMNIPAQUE IOHEXOL 300 MG/ML SOLN, 1058mOMNIPAQUE IOHEXOL 300 MG/ML SOLN  COMPARISON:  01/27/2013  FINDINGS: Lung bases are free of acute infiltrate or sizable effusion.  The liver, gallbladder, spleen, left adrenal gland and pancreas are within normal limits. The left kidney is within normal limits as well. The right kidney is been surgically removed as has the right adrenal gland.  There multiple dilated loops of small bowel identified in the  mid abdomen which start in the first portion of the jejunum. Postsurgical changes are seen. A definitive transition zone is not visualized however the loop in the ileostomy is of normal caliber. These changes are likely due to adhesions. No free fluid is noted. The bladder is well distended. No acute bony abnormality is noted.  IMPRESSION: Changes consistent with small bowel obstruction from the early jejunum into the mid abdomen. A definitive transition zone is not elicited however the distal most portion of the small bowel in the ileostomy is of normal caliber. These changes are likely related to postsurgical adhesions.  Status post right nephrectomy.   Electronically Signed   By: Inez Catalina M.D.   On: 12/30/2013 14:16   Dg Abd 2 Views  12/31/2013   CLINICAL  DATA:  Small bowel obstruction, followup, some abdominal pain and distention  EXAM: ABDOMEN - 2 VIEW  COMPARISON:  CT abdomen pelvis of 12/30/2013 and plain films of the abdomen of the same date  FINDINGS: There is little change in gaseous distention of small bowel with differential air-fluid levels consistent with persistent partial small bowel obstruction. No free air is seen. NG tube is present with the tip in the region of the distal antrum of the stomach. Lumbar scoliosis convex to the left is present with associated degenerative change. Contrast fills the urinary bladder.  IMPRESSION: Little change in partial small bowel obstruction with NG tube extending into the stomach.   Electronically Signed   By: Ivar Drape M.D.   On: 12/31/2013 08:11   Dg Abd Acute W/chest  12/30/2013   CLINICAL DATA:  Abdominal pain. Nausea and vomiting. Symptom onset last evening.  EXAM: ACUTE ABDOMEN SERIES (ABDOMEN 2 VIEW & CHEST 1 VIEW)  COMPARISON:  CT 01/27/2013.  FINDINGS: Mild LEFT basilar atelectasis or scarring. No free air underneath the hemidiaphragms. Resection of the distal RIGHT clavicle associated with prior rotator cuff repair.  Multiple air-fluid levels are present in small bowel in the LEFT mid abdomen. Levoconvex lumbar curvature is present. Small bowel loops are mildly dilated, measuring 38 mm. Bowel staples are present in the LEFT mid abdomen. Patient status post colectomy with ileostomy. RIGHT nephrectomy.  IMPRESSION: Small bowel air-fluid levels and mild dilation in the RIGHT mid abdomen, compatible with recurrent small bowel obstruction.   Electronically Signed   By: Dereck Ligas M.D.   On: 12/30/2013 11:52    Anti-infectives: Anti-infectives   None       Assessment/Plan Multiple previous abdominal surgeries H/o recurrent SBO Now with Hight grade SBO Remote h/o PE   Plan: 1.  Conservative management:  IVF, pain control, antiemetics, NG tube, NPO' 2.  Hopefully she will resolve  conservatively without surgical intervention as she did 2 years ago - no significant improvement on x-rays today. 3.  If no improvement in 48-72 hours consider surgical intervention 4.  Follow labs and KUB    LOS: 1 day    DORT, MEGAN 12/31/2013, 8:28 AM Pager: [phone]

## 2013-12-31 NOTE — Progress Notes (Signed)
Called by nurses because the patient is having perioral and b/l cheek paresthesias as well as hand tetany and paresthesias.  She says she had a short episode of hand tetany in the ER which resolved quickly.  She received a dose of reglan in the ER.  I talked to pharmacy who doesn't think this would be the cause.  Her calcium is normal and her potassium is slightly low.  I have replaced the potassium and ordered stat iCal and CMET.  I've increased her IVF.  I will hold off on muscle relaxer's and try heating pad.  Will continue to monitor.

## 2013-12-31 NOTE — Progress Notes (Signed)
Utilization review completed.  

## 2013-12-31 NOTE — Progress Notes (Signed)
Patient with complaints of tingling in face and hands contracting unable to control. Deanna Burke in to assess.  Orders for potassium placed

## 2014-01-01 ENCOUNTER — Inpatient Hospital Stay (HOSPITAL_COMMUNITY): Payer: Medicare Other

## 2014-01-01 LAB — BASIC METABOLIC PANEL
ANION GAP: 7 (ref 5–15)
BUN: 12 mg/dL (ref 6–23)
CO2: 25 mEq/L (ref 19–32)
Calcium: 8.4 mg/dL (ref 8.4–10.5)
Chloride: 107 mEq/L (ref 96–112)
Creatinine, Ser: 1.14 mg/dL — ABNORMAL HIGH (ref 0.50–1.10)
GFR calc Af Amer: 58 mL/min — ABNORMAL LOW (ref >90)
GFR, EST NON AFRICAN AMERICAN: 50 mL/min — AB (ref >90)
Glucose, Bld: 121 mg/dL — ABNORMAL HIGH (ref 70–99)
POTASSIUM: 3.8 meq/L (ref 3.7–5.3)
SODIUM: 139 meq/L (ref 137–147)

## 2014-01-01 LAB — CBC
HCT: 34.2 % — ABNORMAL LOW (ref 36.0–46.0)
Hemoglobin: 11 g/dL — ABNORMAL LOW (ref 12.0–15.0)
MCH: 29.6 pg (ref 26.0–34.0)
MCHC: 32.2 g/dL (ref 30.0–36.0)
MCV: 92.2 fL (ref 78.0–100.0)
Platelets: 181 10*3/uL (ref 150–400)
RBC: 3.71 MIL/uL — ABNORMAL LOW (ref 3.87–5.11)
RDW: 13.8 % (ref 11.5–15.5)
WBC: 2.9 10*3/uL — ABNORMAL LOW (ref 4.0–10.5)

## 2014-01-01 NOTE — Progress Notes (Signed)
[facility] Progress Note     Subjective: 727m/24hr out of NG tube.  Feels great.  Pain and N/V resolved, ostomy opened up and emptied 14526mthus far.  NG output down.  Feels really good.  Paresthesias gone in hands and face, no more tetany of hands.  Objective: Vital signs in last 24 hours: Temp:  [98.1 F (36.7 C)-99.7 F (37.6 C)] 98.1 F (36.7 C) (09/10 0610) Pulse Rate:  [77-98] 77 (09/10 0610) Resp:  [16] 16 (09/10 0610) BP: (118-128)/(46-66) 128/46 mmHg (09/10 0610) SpO2:  [98 %-100 %] 99 % (09/10 0610) Last BM Date: 01/01/14  Intake/Output from previous day: 09/09 0701 - 09/10 0700 In: 4022.1 [P.O.:120; I.V.:3462.1; NG/GT:40; IV Piggyback:400] Out: 402297Urine:1900; Emesis/NG output:700; StLGXQJ:1941]ntake/Output this shift:    PE: Gen:  Alert, NAD, pleasant Abd: Soft, NT/ND, +BS, no HSM, scars noted, ostomy patent (145064mtool out) Neuro:  No neuro deficits, CSM intact b/l x4 extremities, no hand tetany   Lab Results:   Recent Labs  12/31/13 0450 01/01/14 0512  WBC 3.3* 2.9*  HGB 12.5 11.0*  HCT 37.6 34.2*  PLT 220 181   BMET  Recent Labs  12/31/13 1133 01/01/14 0512  NA 142 139  K 3.6* 3.8  CL 104 107  CO2 26 25  GLUCOSE 110* 121*  BUN 17 12  CREATININE 1.11* 1.14*  CALCIUM 8.4 8.4   PT/INR No results found for this basename: LABPROT, INR,  in the last 72 hours CMP     Component Value Date/Time   NA 139 01/01/2014 0512   NA 141 10/06/2013 1239   K 3.8 01/01/2014 0512   K 3.6 10/06/2013 1239   CL 107 01/01/2014 0512   CL 105 10/09/2012 1103   CO2 25 01/01/2014 0512   CO2 26 10/06/2013 1239   GLUCOSE 121* 01/01/2014 0512   GLUCOSE 89 10/06/2013 1239   GLUCOSE 117* 10/09/2012 1103   BUN 12 01/01/2014 0512   BUN 23.7 10/06/2013 1239   CREATININE 1.14* 01/01/2014 0512   CREATININE 1.3* 10/06/2013 1239   CALCIUM 8.4 01/01/2014 0512   CALCIUM 10.6* 10/06/2013 1239   PROT 6.7 12/31/2013 1133   PROT 8.0 10/06/2013 1239   ALBUMIN 2.9*  12/31/2013 1133   ALBUMIN 3.8 10/06/2013 1239   AST 21 12/31/2013 1133   AST 19 10/06/2013 1239   ALT 16 12/31/2013 1133   ALT 15 10/06/2013 1239   ALKPHOS 53 12/31/2013 1133   ALKPHOS 68 10/06/2013 1239   BILITOT 1.1 12/31/2013 1133   BILITOT 0.82 10/06/2013 1239   GFRNONAA 50* 01/01/2014 0512   GFRAA 58* 01/01/2014 0512   Lipase     Component Value Date/Time   LIPASE 33 06/05/2012 1455       Studies/Results: Ct Abdomen Pelvis W Contrast  12/30/2013   CLINICAL DATA:  Abdominal pain  EXAM: CT ABDOMEN AND PELVIS WITH CONTRAST  TECHNIQUE: Multidetector CT imaging of the abdomen and pelvis was performed using the standard protocol following bolus administration of intravenous contrast.  CONTRAST:  56m22mNIPAQUE IOHEXOL 300 MG/ML SOLN, 100mL46mIPAQUE IOHEXOL 300 MG/ML SOLN  COMPARISON:  01/27/2013  FINDINGS: Lung bases are free of acute infiltrate or sizable effusion.  The liver, gallbladder, spleen, left adrenal gland and pancreas are within normal limits. The left kidney is within normal limits as well. The right kidney is been surgically removed as has the right adrenal gland.  There multiple dilated loops of small bowel identified in the mid abdomen which start  in the first portion of the jejunum. Postsurgical changes are seen. A definitive transition zone is not visualized however the loop in the ileostomy is of normal caliber. These changes are likely due to adhesions. No free fluid is noted. The bladder is well distended. No acute bony abnormality is noted.  IMPRESSION: Changes consistent with small bowel obstruction from the early jejunum into the mid abdomen. A definitive transition zone is not elicited however the distal most portion of the small bowel in the ileostomy is of normal caliber. These changes are likely related to postsurgical adhesions.  Status post right nephrectomy.   Electronically Signed   By: Inez Catalina M.D.   On: 12/30/2013 14:16   Dg Abd 2 Views  12/31/2013   CLINICAL DATA:   Small bowel obstruction, followup, some abdominal pain and distention  EXAM: ABDOMEN - 2 VIEW  COMPARISON:  CT abdomen pelvis of 12/30/2013 and plain films of the abdomen of the same date  FINDINGS: There is little change in gaseous distention of small bowel with differential air-fluid levels consistent with persistent partial small bowel obstruction. No free air is seen. NG tube is present with the tip in the region of the distal antrum of the stomach. Lumbar scoliosis convex to the left is present with associated degenerative change. Contrast fills the urinary bladder.  IMPRESSION: Little change in partial small bowel obstruction with NG tube extending into the stomach.   Electronically Signed   By: Ivar Drape M.D.   On: 12/31/2013 08:11   Dg Abd Acute W/chest  12/30/2013   CLINICAL DATA:  Abdominal pain. Nausea and vomiting. Symptom onset last evening.  EXAM: ACUTE ABDOMEN SERIES (ABDOMEN 2 VIEW & CHEST 1 VIEW)  COMPARISON:  CT 01/27/2013.  FINDINGS: Mild LEFT basilar atelectasis or scarring. No free air underneath the hemidiaphragms. Resection of the distal RIGHT clavicle associated with prior rotator cuff repair.  Multiple air-fluid levels are present in small bowel in the LEFT mid abdomen. Levoconvex lumbar curvature is present. Small bowel loops are mildly dilated, measuring 38 mm. Bowel staples are present in the LEFT mid abdomen. Patient status post colectomy with ileostomy. RIGHT nephrectomy.  IMPRESSION: Small bowel air-fluid levels and mild dilation in the RIGHT mid abdomen, compatible with recurrent small bowel obstruction.   Electronically Signed   By: Dereck Ligas M.D.   On: 12/30/2013 11:52   Dg Abd Portable 1v  01/01/2014   CLINICAL DATA:  Small bowel obstruction.  EXAM: PORTABLE ABDOMEN - 1 VIEW  COMPARISON:  12/31/2013 and 12/30/2013  FINDINGS: NG tube tip is in the distal stomach. There is persistent dilatation of multiple small bowel loops. Multiple surgical staples are present in the  abdomen from previous surgery. No dilated large bowel.  No acute osseous abnormality.  Rotoscoliosis of the lumbar spine.  IMPRESSION: Persistent small bowel obstruction.   Electronically Signed   By: Rozetta Nunnery M.D.   On: 01/01/2014 07:34    Anti-infectives: Anti-infectives   None       Assessment/Plan Multiple previous abdominal surgeries  H/o recurrent SBO  Now with High grade SBO  Remote h/o PE  Hypokalemia - resolved  Plan:  1. Conservative management: IVF, pain control, antiemetics, NG tube (756m/24hr), NPO 2. Seems to be resolving nicely, clamp NG tube and start clear liquid sips during clamping trial 3. KUB not improved, but clinically she's much improved having a lot of ostomy output (14519m.  Have a feeling she's resolving. 4.  Will discuss with Dr. HoExcell Seltzer  how long he wants he clamped 6 hrs vs 24hr 5.  Calcium levels were normal yesterday after recheck and potassium is normal today.  Don't know what could have caused her episode yesterday, but I'm glad it hasn't returned.      LOS: 2 days    Coralie Keens 01/01/2014, 8:45 AM Pager: [phone]

## 2014-01-01 NOTE — Progress Notes (Signed)
Patient interviewed and examined, agree with PA note above. Will leave NG clamped overnight and see how she is doing in the morning. Edward Jolly MD, FACS  01/01/2014 6:20 PM

## 2014-01-02 NOTE — Progress Notes (Signed)
Patient ID: Deanna Burke, female   DOB: 1951/03/02, 63 y.o.   MRN: [id number]    Subjective: Feels much better. No nausea. No severe pain as she was having at admission. States she still has some pain when her ileostomy functions which is a little unusual for her. It has been putting out Large amounts.  Objective: Vital signs in last 24 hours: Temp:  [97.8 F (36.6 C)-99 F (37.2 C)] 98.1 F (36.7 C) (09/11 0620) Pulse Rate:  [80-90] 80 (09/11 0620) Resp:  [17-18] 18 (09/11 0620) BP: (114-132)/(57-70) 132/57 mmHg (09/11 0620) SpO2:  [96 %-100 %] 100 % (09/11 0620) Last BM Date: 01/02/14  Intake/Output from previous day: 09/10 0701 - 09/11 0700 In: 2915 [P.O.:420; I.V.:2490; NG/GT:5] Out: 5050 [Urine:1450; Emesis/NG output:150; VZSMO:7078] Intake/Output this shift: Total I/O In: 531.7 [I.V.:531.7] Out: 0   General appearance: alert, cooperative and no distress GI: still mildly distended. Soft and nontender. Gas and stool in her ileostomy pouch.  Lab Results:   Recent Labs  12/31/13 0450 01/01/14 0512  WBC 3.3* 2.9*  HGB 12.5 11.0*  HCT 37.6 34.2*  PLT 220 181   BMET  Recent Labs  12/31/13 1133 01/01/14 0512  NA 142 139  K 3.6* 3.8  CL 104 107  CO2 26 25  GLUCOSE 110* 121*  BUN 17 12  CREATININE 1.11* 1.14*  CALCIUM 8.4 8.4     Studies/Results: Dg Abd Portable 1v  01/01/2014   CLINICAL DATA:  Small bowel obstruction.  EXAM: PORTABLE ABDOMEN - 1 VIEW  COMPARISON:  12/31/2013 and 12/30/2013  FINDINGS: NG tube tip is in the distal stomach. There is persistent dilatation of multiple small bowel loops. Multiple surgical staples are present in the abdomen from previous surgery. No dilated large bowel.  No acute osseous abnormality.  Rotoscoliosis of the lumbar spine.  IMPRESSION: Persistent small bowel obstruction.   Electronically Signed   By: Rozetta Nunnery M.D.   On: 01/01/2014 07:34    Anti-infectives: Anti-infectives   None       Assessment/Plan: Small bowel obstruction, clinically much improved. She still has some mild distention and some pain when her ileostomy is functioning but very good output. Will D/C NG tube and start small amounts of clear liquids. Repeat abdominal films tomorrow.    LOS: 3 days    Reeder Brisby T 01/02/2014

## 2014-01-03 ENCOUNTER — Inpatient Hospital Stay (HOSPITAL_COMMUNITY): Payer: Medicare Other

## 2014-01-03 NOTE — Progress Notes (Signed)
Patient ID: Deanna Burke, female   DOB: [birth year], 63 y.o.   MRN: [id number] [facility] Surgery Progress Note:   * No surgery found *  Subjective: Mental status is clear.  Feeling better and tolerating clear liquids thus far3 Objective: Vital signs in last 24 hours: Temp:  [97.8 F (36.6 C)-98.3 F (36.8 C)] 97.8 F (36.6 C) (09/12 0600) Pulse Rate:  [78-87] 78 (09/12 0600) Resp:  [16-18] 18 (09/12 0600) BP: (124-148)/(62-68) 124/63 mmHg (09/12 0600) SpO2:  [99 %-100 %] 99 % (09/12 0600)  Intake/Output from previous day: 09/11 0701 - 09/12 0700 In: 1360 [P.O.:60; I.V.:1300] Out: 2200 [Urine:850; Stool:1350] Intake/Output this shift: Total I/O In: 450 [P.O.:450] Out: 950 [Urine:300; Stool:650]  Physical Exam: Work of breathing is normal.  No complaints of abdominal pain  Lab Results:  No results found for this or any previous visit (from the past 48 hour(s)).  Radiology/Results: Dg Abd 2 Views  01/03/2014   CLINICAL DATA:  63 year old female-followup small bowel obstruction.  EXAM: ABDOMEN - 2 VIEW  COMPARISON:  01/01/2014  FINDINGS: An NG tube has been removed.  Decreasing small bowel distention noted.  Small amount of gas in the colon and rectum noted.  No other changes noted.  IMPRESSION: Decreasing small bowel distension compatible with improving small bowel obstruction.   Electronically Signed   By: Hassan Rowan M.D.   On: 01/03/2014 10:03    Anti-infectives: Anti-infectives   None      Assessment/Plan: Problem List: Patient Active Problem List   Diagnosis Date Noted  . SBO (small bowel obstruction) 12/30/2013  . Breast cancer of upper-outer quadrant of left female breast 04/07/2013  . Neoplasm of left breast, primary tumor staging category Tis: ductal carcinoma in situ (DCIS) 01/27/2013  . Pyelonephritis, acute 06/05/2012  . Hydronephrosis of right kidney 06/05/2012  . Hyponatremia 06/05/2012  . Hypokalemia 06/05/2012  . Acute kidney injury 06/05/2012   . 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 Abnormal finding on cardiovascular stress test, ischemia anterolateral 02/13/2012  . Syncope, ? anginal equivilant 02/13/2012  . Ileostomy in place, secondary to ulcerative colitis x 20 years 02/13/2012  . History of recurrent UTIs 02/13/2012  . Hydroureter on right 08/03/2011    Resolving SBO.  Advance to full liquids.  * No surgery found *    LOS: 4 days   Matt B. Hassell Done, MD, [facility] [phone] beeper [phone]  01/03/2014 10:10 AM

## 2014-01-04 NOTE — Progress Notes (Signed)
Patient ID: Deanna Burke, female   DOB: 11/26/1950, 63 y.o.   MRN: [id number] [facility] Surgery Progress Note:   * No surgery found *  Subjective: Mental status is clear.  Up and about.   Objective: Vital signs in last 24 hours: Temp:  [98 F (36.7 C)-98.2 F (36.8 C)] 98 F (36.7 C) (09/13 0616) Pulse Rate:  [78-86] 78 (09/13 0616) Resp:  [17-18] 18 (09/13 0616) BP: (113-124)/(52-58) 113/55 mmHg (09/13 0616) SpO2:  [100 %] 100 % (09/13 0616)  Intake/Output from previous day: 09/12 0701 - 09/13 0700 In: 4795 [P.O.:1170; I.V.:3625] Out: 2600 [Urine:1050; PEAKL:5075] Intake/Output this shift: Total I/O In: 120 [P.O.:120] Out: 600 [Urine:300; Stool:300]  Physical Exam: Work of breathing is normal. Tolerating diet  Lab Results:  No results found for this or any previous visit (from the past 48 hour(s)).  Radiology/Results: Dg Abd 2 Views  01/03/2014   CLINICAL DATA:  63 year old female-followup small bowel obstruction.  EXAM: ABDOMEN - 2 VIEW  COMPARISON:  01/01/2014  FINDINGS: An NG tube has been removed.  Decreasing small bowel distention noted.  Small amount of gas in the colon and rectum noted.  No other changes noted.  IMPRESSION: Decreasing small bowel distension compatible with improving small bowel obstruction.   Electronically Signed   By: Hassan Rowan M.D.   On: 01/03/2014 10:03    Anti-infectives: Anti-infectives   None      Assessment/Plan: Problem List: Patient Active Problem List   Diagnosis Date Noted  . SBO (small bowel obstruction) 12/30/2013  . Breast cancer of upper-outer quadrant of left female breast 04/07/2013  . Neoplasm of left breast, primary tumor staging category Tis: ductal carcinoma in situ (DCIS) 01/27/2013  . Pyelonephritis, acute 06/05/2012  . Hydronephrosis of right kidney 06/05/2012  . Hyponatremia 06/05/2012  . Hypokalemia 06/05/2012  . Acute kidney injury 06/05/2012  . 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 Abnormal finding on cardiovascular stress test, ischemia anterolateral 02/13/2012  . Syncope, ? anginal equivilant 02/13/2012  . Ileostomy in place, secondary to ulcerative colitis x 20 years 02/13/2012  . History of recurrent UTIs 02/13/2012  . Hydroureter on right 08/03/2011    Ready to advance to regular diet.  If tolerated plan discharge tomorrow.   * No surgery found *    LOS: 5 days   Matt B. Hassell Done, MD, [facility] [phone] beeper [phone]  01/04/2014 8:57 AM

## 2014-01-05 NOTE — Discharge Instructions (Signed)
Small Bowel Obstruction °A small bowel obstruction is a blockage (obstruction) of the small intestine (small bowel). The small bowel is a long, slender tube that connects the stomach to the colon. Its job is to absorb nutrients from the fluids and foods you consume into the bloodstream.  °CAUSES  °There are many causes of intestinal blockage. The most common ones include: °· Hernias. This is a more common cause in children than adults. °· Inflammatory bowel disease (enteritis and colitis). °· Twisting of the bowel (volvulus). °· Tumors. °· Scar tissue (adhesions) from previous surgery or radiation treatment. °· Recent surgery. This may cause an acute small bowel obstruction called an ileus. °SYMPTOMS  °· Abdominal pain. This may be dull cramps or sharp pain. It may occur in one area or may be present in the entire abdomen. Pain can range from mild to severe, depending on the degree of obstruction. °· Nausea and vomiting. Vomit may be greenish or yellow bile color. °· Distended or swollen stomach. Abdominal bloating is a common symptom. °· Constipation. °· Lack of passing gas. °· Frequent belching. °· Diarrhea. This may occur if runny stool is able to leak around the obstruction. °DIAGNOSIS  °Your caregiver can usually diagnose small bowel obstruction by taking a history, doing a physical exam, and taking X-rays. If the cause is unclear, a CT scan (computerized tomography) of your abdomen and pelvis may be needed. °TREATMENT  °Treatment of the blockage depends on the cause and how bad the problem is.  °· Sometimes, the obstruction improves with bed rest and intravenous (IV) fluids. °· Resting the bowel is very important. This means following a simple diet. Sometimes, a clear liquid diet may be required for several days. °· Sometimes, a small tube (nasogastric tube) is placed into the stomach to decompress the bowel. When the bowel is blocked, it usually swells up like a balloon filled with air and fluids.  Decompression means that the air and fluids are removed by suction through that tube. This can help with pain, discomfort, and nausea. It can also help the obstruction resolve faster. °· Surgery may be required if other treatments do not work. Bowel obstruction from a hernia may require early surgery and can be an emergency procedure. Adhesions that cause frequent or severe obstructions may also require surgery. °HOME CARE INSTRUCTIONS °If your bowel obstruction is only partial or incomplete, you may be allowed to go home. °· Get plenty of rest. °· Follow your diet as directed by your caregiver. °· Only consume clear liquids until your condition improves. °· Avoid solid foods as instructed. °SEEK IMMEDIATE MEDICAL CARE IF: °· You have increased pain or cramping. °· You vomit blood. °· You have uncontrolled vomiting or nausea. °· You cannot drink fluids due to vomiting or pain. °· You develop confusion. °· You begin feeling very dry or thirsty (dehydrated). °· You have severe bloating. °· You have chills. °· You have a fever. °· You feel extremely weak or you faint. °MAKE SURE YOU: °· Understand these instructions. °· Will watch your condition. °· Will get help right away if you are not doing well or get worse. °Document Released: 06/27/2005 Document Revised: 07/03/2011 Document Reviewed: 06/24/2010 °ExitCare® Patient Information ©2015 [employer]. This information is not intended to replace advice given to you by your health care provider. Make sure you discuss any questions you have with your health care provider. ° °

## 2014-01-05 NOTE — Discharge Summary (Signed)
I have seen and examined the pt and agree with PA-Osborne's progress note.

## 2014-01-05 NOTE — Progress Notes (Signed)
Discharge instructions given to patient. Questions answered 

## 2014-01-05 NOTE — Discharge Summary (Signed)
Patient ID: Deanna Burke MRN: [id number] DOB/AGE: 65-Mar-1952 65 y.o.  Admit date: 12/30/2013 Discharge date: 01/05/2014  Procedures: none  Consults: None  Reason for Admission: patient is a 63 year old female who presents with the acute onset of abdominal pain, nausea and vomiting. She felt well until 24 hours ago when after dinner she had the gradual onset of pressure-like pain initially in her epigastrium and radiating out through her abdomen. She developed nausea and vomiting. Today she had persistent pain which is diffuse and aching and somewhat intermittent. This has been associated with several episodes of vomiting which she describes as brown. The patient has an end ileostomy with a significant history of total abdominal colectomy for ulcerative colitis in the 1970s. In 1996 she had laparotomy and small bowel resection for perforated small bowel with abscess. She was hospitalized for small bowel obstruction in 2013 that resolved without surgery. She is noted significantly decreased output from her ileostomy over the past 24 hours. The symptoms are the same as her previous bowel obstruction 2 years ago and she presented to the emergency department for evaluation. No fever or chills. No urinary symptoms.  Admission Diagnoses:  1. SBO, s/p multiple abdominal surgeries  Hospital Course: The patient was admitted and conservative management with NPO and IVFs was initiated as well as NGT.  After 3 days, the patient began to improve with decrease NGT output and passing flatus.  Her NGT was removed.  Over the next couple of days her diet was able to be advanced as tolerated.  On HD 7, she was stable for dc home.  PE: Abd: soft, NT, ND, +BS  Discharge Diagnoses:  Active Problems:   SBO (small bowel obstruction), resolved   Discharge Medications:   Medication List         acetaminophen 500 MG tablet  Commonly known as:  TYLENOL  Take 1,000 mg by mouth every 6 (six) hours as needed for  moderate pain.     aspirin 81 MG tablet  Take 81 mg by mouth daily.     DEVROM 200 MG Chew  Generic drug:  Bismuth Subgallate  Chew 400 mg by mouth 4 (four) times daily. For odor control - pt has ileostomy     diclofenac sodium 1 % Gel  Commonly known as:  VOLTAREN  Apply 2 g topically 4 (four) times daily as needed (for pain). For shoulder/ joint pain     multivitamin with minerals tablet  Take 1 tablet by mouth daily.     oxybutynin 5 MG tablet  Commonly known as:  DITROPAN  Take 5 mg by mouth every 8 (eight) hours as needed for bladder spasms.     potassium chloride SA 20 MEQ tablet  Commonly known as:  K-DUR,KLOR-CON  Take 1 tablet (20 mEq total) by mouth daily.     tamoxifen 20 MG tablet  Commonly known as:  NOLVADEX  Take 1 tablet (20 mg total) by mouth daily.     triamterene-hydrochlorothiazide 37.5-25 MG per tablet  Commonly known as:  MAXZIDE-25  Take 1 tablet by mouth every morning.        Discharge Instructions:     Follow-up Information   Follow up with [name], MD. (As needed)    Specialty:  Internal Medicine   Contact information:   [place] [address] [phone]       Signed: Henreitta Cea 01/05/2014, 9:28 AM

## 2014-01-05 NOTE — Care Management Note (Signed)
    Page 1 of 1   01/05/2014     12:53:43 PM CARE MANAGEMENT NOTE 01/05/2014  Patient:  Deanna Burke, Deanna Burke   Account Number:  [id number]  Date Initiated:  01/05/2014  Documentation initiated by:  Sunday Spillers  Subjective/Objective Assessment:   63 yo admitted with SBO     Action/Plan:   Home when stable   Anticipated DC Date:  01/05/2014   Anticipated DC Plan:  [place]  CM consult      Choice offered to / List presented to:             Status of service:  Completed, signed off Medicare Important Message given?  YES (If response is "NO", the following Medicare IM given date fields will be blank) Date Medicare IM given:  01/05/2014 Medicare IM given by:  [facility] Date Additional Medicare IM given:   Additional Medicare IM given by:    Discharge Disposition:  HOME/SELF CARE  Per UR Regulation:  Reviewed for med. necessity/level of care/duration of stay  If discussed at [place] of Stay Meetings, dates discussed:    Comments:

## 2014-02-06 ENCOUNTER — Other Ambulatory Visit: Payer: Self-pay

## 2014-04-02 ENCOUNTER — Encounter (HOSPITAL_COMMUNITY): Payer: Self-pay | Admitting: Cardiovascular Disease

## 2014-04-09 ENCOUNTER — Ambulatory Visit
Admission: RE | Admit: 2014-04-09 | Discharge: 2014-04-09 | Disposition: A | Discharge status: Home / Self Care | Payer: Medicare Other | Source: Ambulatory Visit | Attending: Oncology | Admitting: Oncology

## 2014-04-09 DIAGNOSIS — Z853 Personal history of malignant neoplasm of breast: Secondary | ICD-10-CM

## 2014-04-22 ENCOUNTER — Other Ambulatory Visit: Payer: Self-pay | Admitting: *Deleted

## 2014-04-22 DIAGNOSIS — E876 Hypokalemia: Secondary | ICD-10-CM

## 2014-04-22 MED ORDER — POTASSIUM CHLORIDE CRYS ER 20 MEQ PO TBCR: 20.0000 meq | EXTENDED_RELEASE_TABLET | Freq: Every day | ORAL | Status: DC

## 2014-05-04 ENCOUNTER — Other Ambulatory Visit (HOSPITAL_BASED_OUTPATIENT_CLINIC_OR_DEPARTMENT_OTHER): Payer: Medicare Other

## 2014-05-04 DIAGNOSIS — E876 Hypokalemia: Secondary | ICD-10-CM

## 2014-05-04 DIAGNOSIS — Z853 Personal history of malignant neoplasm of breast: Secondary | ICD-10-CM

## 2014-05-04 LAB — COMPREHENSIVE METABOLIC PANEL (CC13)
ALBUMIN: 3.6 g/dL (ref 3.5–5.0)
ALT: 14 U/L (ref 0–55)
AST: 19 U/L (ref 5–34)
Alkaline Phosphatase: 73 U/L (ref 40–150)
Anion Gap: 9 mEq/L (ref 3–11)
BUN: 20.5 mg/dL (ref 7.0–26.0)
CHLORIDE: 103 meq/L (ref 98–109)
CO2: 28 meq/L (ref 22–29)
CREATININE: 1.2 mg/dL — AB (ref 0.6–1.1)
Calcium: 9.5 mg/dL (ref 8.4–10.4)
EGFR: 49 mL/min/{1.73_m2} — AB (ref >90)
Glucose: 88 mg/dl (ref 70–140)
Potassium: 4.1 mEq/L (ref 3.5–5.1)
Sodium: 139 mEq/L (ref 136–145)
Total Bilirubin: 0.74 mg/dL (ref 0.20–1.20)
Total Protein: 7.5 g/dL (ref 6.4–8.3)

## 2014-05-04 LAB — CBC WITH DIFFERENTIAL/PLATELET
BASO%: 0.6 % (ref 0.0–2.0)
Basophils Absolute: 0 10*3/uL (ref 0.0–0.1)
EOS%: 2.4 % (ref 0.0–7.0)
Eosinophils Absolute: 0.1 10*3/uL (ref 0.0–0.5)
HCT: 40.8 % (ref 34.8–46.6)
HGB: 13.4 g/dL (ref 11.6–15.9)
LYMPH%: 19.4 % (ref 14.0–49.7)
MCH: 29.6 pg (ref 25.1–34.0)
MCHC: 32.8 g/dL (ref 31.5–36.0)
MCV: 90.3 fL (ref 79.5–101.0)
MONO#: 0.3 10*3/uL (ref 0.1–0.9)
MONO%: 5.5 % (ref 0.0–14.0)
NEUT#: 3.9 10*3/uL (ref 1.5–6.5)
NEUT%: 72.1 % (ref 38.4–76.8)
Platelets: 297 10*3/uL (ref 145–400)
RBC: 4.52 10*6/uL (ref 3.70–5.45)
RDW: 12.8 % (ref 11.2–14.5)
WBC: 5.4 10*3/uL (ref 3.9–10.3)
lymph#: 1.1 10*3/uL (ref 0.9–3.3)

## 2014-05-07 ENCOUNTER — Encounter (HOSPITAL_COMMUNITY): Payer: Self-pay | Admitting: Surgery

## 2014-05-11 ENCOUNTER — Telehealth: Payer: Self-pay | Admitting: Oncology

## 2014-05-11 ENCOUNTER — Ambulatory Visit (HOSPITAL_BASED_OUTPATIENT_CLINIC_OR_DEPARTMENT_OTHER): Payer: Medicare Other | Admitting: Oncology

## 2014-05-11 VITALS — BP 126/64 | HR 88 | Temp 97.7°F | Resp 18 | Ht 64.0 in | Wt 165.5 lb

## 2014-05-11 DIAGNOSIS — R9439 Abnormal result of other cardiovascular function study: Secondary | ICD-10-CM

## 2014-05-11 DIAGNOSIS — E876 Hypokalemia: Secondary | ICD-10-CM

## 2014-05-11 DIAGNOSIS — C50412 Malignant neoplasm of upper-outer quadrant of left female breast: Secondary | ICD-10-CM

## 2014-05-11 DIAGNOSIS — D0512 Intraductal carcinoma in situ of left breast: Secondary | ICD-10-CM

## 2014-05-11 DIAGNOSIS — I10 Essential (primary) hypertension: Secondary | ICD-10-CM

## 2014-05-11 DIAGNOSIS — E871 Hypo-osmolality and hyponatremia: Secondary | ICD-10-CM

## 2014-05-11 DIAGNOSIS — N179 Acute kidney failure, unspecified: Secondary | ICD-10-CM

## 2014-05-11 MED ORDER — POTASSIUM CHLORIDE CRYS ER 20 MEQ PO TBCR
20.0000 meq | EXTENDED_RELEASE_TABLET | Freq: Every day | ORAL | Status: DC
Start: 2014-05-11 — End: 2014-06-23

## 2014-05-11 MED ORDER — FLUCONAZOLE 100 MG PO TABS: 100.0000 mg | ORAL_TABLET | Freq: Every day | ORAL | Status: DC

## 2014-05-11 MED ORDER — TAMOXIFEN CITRATE 20 MG PO TABS: 20.0000 mg | ORAL_TABLET | Freq: Every day | ORAL | Status: DC

## 2014-05-11 MED ORDER — ESTRADIOL 10 MCG VA TABS: 1.0000 | ORAL_TABLET | VAGINAL | Status: DC

## 2014-05-11 NOTE — Telephone Encounter (Signed)
gv pt appt schedule for jan 2016

## 2014-05-11 NOTE — Addendum Note (Signed)
Addended by: Laureen Abrahams on: 05/11/2014 05:51 PM   Modules accepted: Medications

## 2014-05-11 NOTE — Progress Notes (Signed)
ID: Sharyne Richters OB: 05-Aug-1950  MR#: [id number]  EUM#:[id number]  PCP: [name], MD GYN:  Halford Chessman SU: Coralie Keens, MD OTHER MD: Jamal Maes, MD;  Alexis Frock, MD, Eppie Gibson, MD  CHIEF COMPLAINT: Estrogen receptor positive Left Breast Cancer  CURRENT TREATMENT: Tamoxifen   HISTORY OF PRESENT ILLNESS: From the original intake note:  Deanna Burke had routine screening mammography 03/22/2011 at [facility]. This showed new microcalcifications in the left breast. She was recalled for additional views April 07, 2011, and these showed the calcifications to be pleomorphic and linear. 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   With this information the patient was referred for bilateral breast MRI, performed 04/27/2011. This showed no suspicious findings on the right. On the left there was 11 mm lobulated mass 5 cm from the prior biopsy clip. 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   Patient underwent left lumpectomy in February 2013 with negative margins.   Additional treatment history is detailed below.  INTERVAL HISTORY: Ciara returns today for follow-up of her noninvasive breast cancer. She continues on tamoxifen. She obtains it at a good price, and has no side effects from it that she is aware of. In particular hot flashes and vaginal wetness are not issues.--Since her last visit here she finally took the plunge and started using vaginal estrogen suppositories twice a week. The improvement in her symptoms has been "miraculous". The itching, dryness, and sense of pressure are all gone.   REVIEW OF SYSTEMS: Nikyla exercises by walking about 30 minutes 3 times a week. She has some back and joint pain, which is not more persistent or intense than before. A detailed review of  systems today was otherwise noncontributory  PAST MEDICAL HISTORY: Past Medical History  Diagnosis Date  . Ulcerative colitis [phone]  . Pulmonary embolus 1971  . Phlebitis 1971  . Small bowel obstruction [id number]  . Small bowel perforation 1971/1996  . Large bowel perforation 1971/1996  . Scoliosis   . Torn rotator cuff 2005  . History of bone density study 2012  . Fatigue   . Cataract     Left Eye  . Hot flashes   . 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 Hypertension   . Cancer     DCIS L breast  . H/O ulcerative colitis   . Breast cancer   . Arthritis     hands, lumbar spine, hips, right ankle  . S/P ileostomy   . S/P radiation therapy 07/24/11 - 09/06/11    Left Breast/ 5000 cGy in 25 Fractions with Boost of 1000 cGy in 5 Fractions  . 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 Abnormal finding on cardiovascular stress test, ischemia anterolateral 02/13/2012    follow heart cath - pt told one tiny blockage that didn't even matter  . Syncope, ? anginal equivilant 02/13/2012  . Ileostomy in place, secondary to ulcerative colitis x 20 years 02/13/2012  . History of recurrent UTIs 02/13/2012  . Sleep apnea 2009    uses cpap-setting is 1  . H/O nephrostomy 08/13/12    PAST SURGICAL HISTORY: Past Surgical History  Procedure Laterality Date  . Colectomy  1973  . Ileostomy  1973  . Vaginoplasty  1975  . Exploratory laparotomy  1978/1990    with lysis of adhesions  . Eye surgery  05/08/2011    left cataract removal   . Appendectomy    . Rotator cuff repair  2006    Right  . Breast surgery  05/03/11    LEFT BREAST NEEDLE CORE BIOPSY- DCIS  . 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  ER:+, PR+,   . Abdominal hysterectomy  1996    partial  . Exploratory laps      several due to abd  pain related to ulcerative colitis  . Cardiac catheterization    . Laparoscopic nephrectomy Right 08/14/2012    Procedure: RIGHT LAPAROSCOPIC RETROPERITONEAL LAPAROSCOPIC NEPHRECTOMY ;  Surgeon: Alexis Frock, MD;  Location: [facility] ORS;  Service: Urology;  Laterality: Right;  RIGHT LAPAROSCOPIC RETROPERITONEAL LAPAROSCOPIC NEPHRECTOMY, POSSIBLE OPEN   . Left heart catheterization with coronary angiogram N/A 02/13/2012    Procedure: LEFT HEART CATHETERIZATION WITH CORONARY ANGIOGRAM;  Surgeon: Lorretta Harp, MD;  Location: [facility] CATH LAB;  Service: Cardiovascular;  Laterality: N/A;    FAMILY HISTORY Family History  Problem Relation Age of Onset  . Breast cancer Maternal Grandmother   . Stomach cancer Maternal Grandfather   . Cancer Paternal Grandmother     stomach  . Cancer Other     Breast  . Atrial fibrillation Mother   . Heart attack Father   . Atrial fibrillation Brother   . Heart attack Paternal Uncle   She had one brothers no sisters. There is no history of breast or ovarian cancer in the immediate family.   GYNECOLOGIC HISTORY:  (Reviewed 10/06/2013) menarche age 40, underwent hysterectomy and bilateral salpingo-oophorectomy in 1996. She took hormone replacement for 16 years, stopping only at the time of this diagnosis. She is GX P0.  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 She is divorced. She lives by herself, with no pets.     ADVANCED DIRECTIVES: Not in place   HEALTH MAINTENANCE:  (Updated 10/06/2013) History  Substance Use Topics  . Smoking status: Never Smoker   . Smokeless tobacco: Never Used  . Alcohol Use: No     Colonoscopy: s/p ileostomy for ulcerative colitis  PAP: 04/23/2014  Bone density: 2007 - Normal  Lipid panel: Not on file/Dr. Arvind  Allergies  Allergen Reactions  . Demerol Nausea And Vomiting  . Dilaudid [Hydromorphone Hcl] Nausea And Vomiting and Other (See Comments)    Reaction: muscle flexion  and cramping also with nausea and vomiting  . Reglan [Metoclopramide] Other (See Comments)    Cramping hands   . Stadol [Butorphanol Tartrate] Nausea And Vomiting  . Cephalexin Rash  . Penicillins Rash    Current Outpatient Prescriptions  Medication Sig Dispense Refill  . acetaminophen (TYLENOL) 500 MG tablet Take 1,000 mg by mouth every 6 (six) hours as needed for moderate pain.    [name] aspirin 81 MG tablet Take 81 mg by mouth daily.    . Bismuth Subgallate (DEVROM) 200 MG CHEW Chew 400 mg by mouth 4 (four) times daily. For odor control - pt has ileostomy    . diclofenac sodium (VOLTAREN) 1 % GEL Apply 2 g topically 4 (four) times daily as needed (for pain). For shoulder/ joint pain    . Multiple Vitamins-Minerals (MULTIVITAMIN WITH MINERALS) tablet Take 1 tablet by mouth daily.    [name] oxybutynin (DITROPAN) 5 MG tablet Take 5 mg  by mouth every 8 (eight) hours as needed for bladder spasms.     . potassium chloride SA (K-DUR,KLOR-CON) 20 MEQ tablet Take 1 tablet (20 mEq total) by mouth daily. 30 tablet 0  . tamoxifen (NOLVADEX) 20 MG tablet Take 1 tablet (20 mg total) by mouth daily. 90 tablet 3  . triamterene-hydrochlorothiazide (MAXZIDE-25) 37.5-25 MG per tablet Take 1 tablet by mouth every morning.     No current facility-administered medications for this visit.    OBJECTIVE: Middle-aged white woman in no acute distress Filed Vitals:   05/11/14 1334  BP: 126/64  Pulse: 88  Temp: 97.7 F (36.5 C)  Resp: 18     Body mass index is 28.39 kg/(m^2).    ECOG FS: 1 Filed Weights   05/11/14 1334  Weight: 165 lb 8 oz (75.07 kg)   Sclerae unicteric, pupils equal and reactive Oropharynx clear and moist-- no thrush or other lesions  No cervical or supraclavicular adenopathy Lungs no rales or rhonchi Heart regular rate and rhythm Abd soft, nontender, positive bowel sounds; ileostomy in place MSK no focal spinal tenderness, no upper extremity lymphedema Neuro: nonfocal, well oriented,  appropriate affect Breasts: The right breast is unremarkable. The left breast is status post lumpectomy and radiation. There is no evidence of local recurrence. The left axilla is benign.   LAB RESULTS:    Lab Results  Component Value Date   WBC 5.4 05/04/2014   NEUTROABS 3.9 05/04/2014   HGB 13.4 05/04/2014   HCT 40.8 05/04/2014   MCV 90.3 05/04/2014   PLT 297 05/04/2014      Chemistry      Component Value Date/Time   NA 139 05/04/2014 1031   NA 139 01/01/2014 0512   K 4.1 05/04/2014 1031   K 3.8 01/01/2014 0512   CL 107 01/01/2014 0512   CL 105 10/09/2012 1103   CO2 28 05/04/2014 1031   CO2 25 01/01/2014 0512   BUN 20.5 05/04/2014 1031   BUN 12 01/01/2014 0512   CREATININE 1.2* 05/04/2014 1031   CREATININE 1.14* 01/01/2014 0512      Component Value Date/Time   CALCIUM 9.5 05/04/2014 1031   CALCIUM 8.4 01/01/2014 0512   ALKPHOS 73 05/04/2014 1031   ALKPHOS 53 12/31/2013 1133   AST 19 05/04/2014 1031   AST 21 12/31/2013 1133   ALT 14 05/04/2014 1031   ALT 16 12/31/2013 1133   BILITOT 0.74 05/04/2014 1031   BILITOT 1.1 12/31/2013 1133       STUDIES:  CLINICAL DATA: Status post left breast lumpectomy for breast carcinoma in February 2013. Annual diagnostic follow-up. No current complaints.  EXAM: DIGITAL DIAGNOSTIC BILATERAL MAMMOGRAM WITH 3D TOMOSYNTHESIS AND CAD  COMPARISON: Prior exams  ACR Breast Density Category c: The breast tissue is heterogeneously dense, which may obscure small masses.  FINDINGS: There are no discrete masses. Architectural distortion from postsurgical scarring on the left is stable. No other architectural distortion. No suspicious calcifications.  Mammographic images were processed with CAD.  IMPRESSION: No evidence of recurrent or new breast malignancy. Benign postsurgical changes on the left.  RECOMMENDATION: Annual diagnostic mammography per standard post lumpectomy protocol.  I have discussed the  findings and recommendations with the patient. Results were also provided in writing at the conclusion of the visit. If applicable, a reminder letter will be sent to the patient regarding the next appointment.  BI-RADS CATEGORY 2: Benign.   Electronically Signed  By: Lajean Manes M.D.  On: 04/09/2014 10:32  ASSESSMENT: 64 y.o. [place] woman   (1)  status post left lumpectomy 05/31/2011, for ductal carcinoma in situ, involving 2 sites, both estrogen and progesterone receptor positive, high-grade, with negative margins.   (2)  Completed adjuvant radiation May 2013  (3)  began on tamoxifen in May 2013  (4)  status post right nephrectomy in April 2014 with benign pathology  (5)  Hypercalcemia-- intermittent -- resolved  (6)  history of hypokalemia, on triamterene/HCTZ for history of hypertension - normalized and stable with oral potassium supplementation   PLAN: Ramani is doing fine as far as her noninvasive breast cancer is concerned. There is no evidence of disease activity or recurrence. Normally we would continue tamoxifen to May 2018.  However the tamoxifen allows her to safely use vaginal estrogens, and she is getting significant benefit from this. Accordingly in her case, assuming she wants to continue to use the vaginal estrogen suppositories, the plan would be to continue tamoxifen to 10 years  We did discuss Estring as an alternative to the suppositories. She is so pleased with the current results however that she does not want to make any change.  I went ahead and refill her Diflucan, potassium, tamoxifen, and Vagifem suppositories today. She will see me again in one year, after her next mammogram. She knows to call for any problems that may develop before that visit.  Chauncey Cruel, MD   05/11/2014 1:48 PM

## 2014-05-11 NOTE — Telephone Encounter (Signed)
correction to previous note gave patient avs report for jan 2017.

## 2014-06-23 ENCOUNTER — Other Ambulatory Visit: Payer: Self-pay | Admitting: *Deleted

## 2014-06-23 DIAGNOSIS — E876 Hypokalemia: Secondary | ICD-10-CM

## 2014-06-23 MED ORDER — POTASSIUM CHLORIDE CRYS ER 20 MEQ PO TBCR: 20.0000 meq | EXTENDED_RELEASE_TABLET | Freq: Every day | ORAL | Status: DC

## 2014-08-28 ENCOUNTER — Other Ambulatory Visit: Payer: Self-pay | Admitting: *Deleted

## 2014-08-28 DIAGNOSIS — E876 Hypokalemia: Secondary | ICD-10-CM

## 2014-08-28 NOTE — Telephone Encounter (Signed)
err

## 2014-10-19 ENCOUNTER — Other Ambulatory Visit: Payer: Self-pay

## 2014-12-23 ENCOUNTER — Encounter: Payer: Self-pay | Admitting: Cardiovascular Disease

## 2015-03-09 ENCOUNTER — Ambulatory Visit: Payer: Self-pay | Admitting: Orthopedic Surgery

## 2015-03-16 ENCOUNTER — Other Ambulatory Visit: Payer: Self-pay | Admitting: Oncology

## 2015-03-16 ENCOUNTER — Other Ambulatory Visit: Payer: Self-pay

## 2015-03-16 DIAGNOSIS — Z853 Personal history of malignant neoplasm of breast: Secondary | ICD-10-CM

## 2015-03-24 NOTE — Patient Instructions (Addendum)
BROOKE STEINHILBER  03/24/2015   Your procedure is scheduled on:   Thursday 04/01/2015  Report to [facility] Main  Entrance take [place]  elevators to 3rd floor to  [place] at  [place] AM.  Call this number if you have problems the morning of surgery [phone]   Remember: ONLY 1 PERSON MAY GO WITH YOU TO SHORT STAY TO GET  READY MORNING OF [place].   Do not eat food or drink liquids :After Midnight.     Take these medicines the morning of surgery with A SIP OF WATER: TAMOXIFEN              DO NOT TAKE ANY DIABETIC MEDICATIONS DAY OF YOUR SURGERY                               You may not have any metal on your body including hair pins and              piercings  Do not wear jewelry, make-up, lotions, powders or perfumes, deodorant             Do not wear nail polish.  Do not shave  48 hours prior to surgery.              Men may shave face and neck.   Do not bring valuables to the hospital. [place].  Contacts, dentures or bridgework may not be worn into surgery.  Leave suitcase in the car. After surgery it may be brought to your room.     Patients discharged the day of surgery will not be allowed to drive home.  Name and phone number of your driver:  Special Instructions: N/A              Please read over the following fact sheets you were given: _____________________________________________________________________             [facility] - Preparing for Surgery Before surgery, you can play an important role.  Because skin is not sterile, your skin needs to be as free of germs as possible.  You can reduce the number of germs on your skin by washing with CHG (chlorahexidine gluconate) soap before surgery.  CHG is an antiseptic cleaner which kills germs and bonds with the skin to continue killing germs even after washing. Please DO NOT use if you have an allergy to CHG or antibacterial soaps.   If your skin becomes reddened/irritated stop using the CHG and inform your nurse when you arrive at Short Stay. Do not shave (including legs and underarms) for at least 48 hours prior to the first CHG shower.  You may shave your face/neck. Please follow these instructions carefully:  1.  Shower with CHG Soap the night before surgery and the  morning of Surgery.  2.  If you choose to wash your hair, wash your hair first as usual with your  normal  shampoo.  3.  After you shampoo, rinse your hair and body thoroughly to remove the  shampoo.                           4.  Use CHG as you would  any other liquid soap.  You can apply chg directly  to the skin and wash                       Gently with a scrungie or clean washcloth.  5.  Apply the CHG Soap to your body ONLY FROM THE NECK DOWN.   Do not use on face/ open                           Wound or open sores. Avoid contact with eyes, ears mouth and genitals (private parts).                       Wash face,  Genitals (private parts) with your normal soap.             6.  Wash thoroughly, paying special attention to the area where your surgery  will be performed.  7.  Thoroughly rinse your body with warm water from the neck down.  8.  DO NOT shower/wash with your normal soap after using and rinsing off  the CHG Soap.                9.  Pat yourself dry with a clean towel.            10.  Wear clean pajamas.            11.  Place clean sheets on your bed the night of your first shower and do not  sleep with pets. Day of Surgery : Do not apply any lotions/deodorants the morning of surgery.  Please wear clean clothes to the hospital/surgery center.  FAILURE TO FOLLOW THESE INSTRUCTIONS MAY RESULT IN THE CANCELLATION OF YOUR SURGERY PATIENT SIGNATURE_________________________________  NURSE SIGNATURE__________________________________  ________________________________________________________________________   [name]  An incentive  spirometer is a tool that can help keep your lungs clear and active. This tool measures how well you are filling your lungs with each breath. Taking long deep breaths may help reverse or decrease the chance of developing breathing (pulmonary) problems (especially infection) following:  A long period of time when you are unable to move or be active. BEFORE THE PROCEDURE   If the spirometer includes an indicator to show your best effort, your nurse or respiratory therapist will set it to a desired goal.  If possible, sit up straight or lean slightly forward. Try not to slouch.  Hold the incentive spirometer in an upright position. INSTRUCTIONS FOR USE   Sit on the edge of your bed if possible, or sit up as far as you can in bed or on a chair.  Hold the incentive spirometer in an upright position.  Breathe out normally.  Place the mouthpiece in your mouth and seal your lips tightly around it.  Breathe in slowly and as deeply as possible, raising the piston or the ball toward the top of the column.  Hold your breath for 3-5 seconds or for as long as possible. Allow the piston or ball to fall to the bottom of the column.  Remove the mouthpiece from your mouth and breathe out normally.  Rest for a few seconds and repeat Steps 1 through 7 at least 10 times every 1-2 hours when you are awake. Take your time and take a few normal breaths between deep breaths.  The spirometer may include an indicator to show your best effort. Use the indicator as  a goal to work toward during each repetition.  After each set of 10 deep breaths, practice coughing to be sure your lungs are clear. If you have an incision (the cut made at the time of surgery), support your incision when coughing by placing a pillow or rolled up towels firmly against it. Once you are able to get out of bed, walk around indoors and cough well. You may stop using the incentive spirometer when instructed by your caregiver.  RISKS AND  COMPLICATIONS  Take your time so you do not get dizzy or light-headed.  If you are in pain, you may need to take or ask for pain medication before doing incentive spirometry. It is harder to take a deep breath if you are having pain. AFTER USE  Rest and breathe slowly and easily.  It can be helpful to keep track of a log of your progress. Your caregiver can provide you with a simple table to help with this. If you are using the spirometer at home, follow these instructions: [place] IF:   You are having difficultly using the spirometer.  You have trouble using the spirometer as often as instructed.  Your pain medication is not giving enough relief while using the spirometer.  You develop fever of 100.5 F (38.1 C) or higher. SEEK IMMEDIATE MEDICAL CARE IF:   You cough up bloody sputum that had not been present before.  You develop fever of 102 F (38.9 C) or greater.  You develop worsening pain at or near the incision site. MAKE SURE YOU:   Understand these instructions.  Will watch your condition.  Will get help right away if you are not doing well or get worse. Document Released: 08/21/2006 Document Revised: 07/03/2011 Document Reviewed: 10/22/2006 ExitCare Patient Information 2014 ExitCare, [place].   ________________________________________________________________________  WHAT IS A BLOOD TRANSFUSION? Blood Transfusion Information  A transfusion is the replacement of blood or some of its parts. Blood is made up of multiple cells which provide different functions.  Red blood cells carry oxygen and are used for blood loss replacement.  White blood cells fight against infection.  Platelets control bleeding.  Plasma helps clot blood.  Other blood products are available for specialized needs, such as hemophilia or other clotting disorders. BEFORE THE TRANSFUSION  Who gives blood for transfusions?   Healthy volunteers who are fully evaluated to make sure  their blood is safe. This is blood bank blood. Transfusion therapy is the safest it has ever been in the practice of medicine. Before blood is taken from a donor, a complete history is taken to make sure that person has no history of diseases nor engages in risky social behavior (examples are intravenous drug use or sexual activity with multiple partners). The donor's travel history is screened to minimize risk of transmitting infections, such as malaria. The donated blood is tested for signs of infectious diseases, such as HIV and hepatitis. The blood is then tested to be sure it is compatible with you in order to minimize the chance of a transfusion reaction. If you or a relative donates blood, this is often done in anticipation of surgery and is not appropriate for emergency situations. It takes many days to process the donated blood. RISKS AND COMPLICATIONS Although transfusion therapy is very safe and saves many lives, the main dangers of transfusion include:   Getting an infectious disease.  Developing a transfusion reaction. This is an allergic reaction to something in the blood you were given.  Every precaution is taken to prevent this. The decision to have a blood transfusion has been considered carefully by your caregiver before blood is given. Blood is not given unless the benefits outweigh the risks. AFTER THE TRANSFUSION  Right after receiving a blood transfusion, you will usually feel much better and more energetic. This is especially true if your red blood cells have gotten low (anemic). The transfusion raises the level of the red blood cells which carry oxygen, and this usually causes an energy increase.  The nurse administering the transfusion will monitor you carefully for complications. HOME CARE INSTRUCTIONS  No special instructions are needed after a transfusion. You may find your energy is better. Speak with your caregiver about any limitations on activity for underlying diseases  you may have. SEEK MEDICAL CARE IF:   Your condition is not improving after your transfusion.  You develop redness or irritation at the intravenous (IV) site. SEEK IMMEDIATE MEDICAL CARE IF:  Any of the following symptoms occur over the next 12 hours:  Shaking chills.  You have a temperature by mouth above 102 F (38.9 C), not controlled by medicine.  Chest, back, or muscle pain.  People around you feel you are not acting correctly or are confused.  Shortness of breath or difficulty breathing.  Dizziness and fainting.  You get a rash or develop hives.  You have a decrease in urine output.  Your urine turns a dark color or changes to pink, red, or brown. Any of the following symptoms occur over the next 10 days:  You have a temperature by mouth above 102 F (38.9 C), not controlled by medicine.  Shortness of breath.  Weakness after normal activity.  The white part of the eye turns yellow (jaundice).  You have a decrease in the amount of urine or are urinating less often.  Your urine turns a dark color or changes to pink, red, or brown. Document Released: 04/07/2000 Document Revised: 07/03/2011 Document Reviewed: 11/25/2007 [facility] Patient Information 2014 [place], [place].  _______________________________________________________________________

## 2015-03-25 ENCOUNTER — Ambulatory Visit: Payer: Self-pay | Admitting: Orthopedic Surgery

## 2015-03-25 ENCOUNTER — Encounter (HOSPITAL_COMMUNITY): Payer: Self-pay

## 2015-03-25 ENCOUNTER — Encounter (HOSPITAL_COMMUNITY)
Admission: RE | Admit: 2015-03-25 | Discharge: 2015-03-25 | Disposition: A | Discharge status: Home / Self Care | Payer: Medicare Other | Source: Ambulatory Visit | Attending: Specialist | Admitting: Specialist

## 2015-03-25 ENCOUNTER — Ambulatory Visit (HOSPITAL_COMMUNITY)
Admission: RE | Admit: 2015-03-25 | Discharge: 2015-03-25 | Disposition: A | Discharge status: Home / Self Care | Payer: Medicare Other | Source: Ambulatory Visit | Attending: Orthopedic Surgery | Admitting: Orthopedic Surgery

## 2015-03-25 DIAGNOSIS — Z01812 Encounter for preprocedural laboratory examination: Secondary | ICD-10-CM | POA: Insufficient documentation

## 2015-03-25 DIAGNOSIS — Z01818 Encounter for other preprocedural examination: Secondary | ICD-10-CM | POA: Insufficient documentation

## 2015-03-25 DIAGNOSIS — I10 Essential (primary) hypertension: Secondary | ICD-10-CM | POA: Insufficient documentation

## 2015-03-25 DIAGNOSIS — Z86711 Personal history of pulmonary embolism: Secondary | ICD-10-CM

## 2015-03-25 DIAGNOSIS — M1712 Unilateral primary osteoarthritis, left knee: Secondary | ICD-10-CM | POA: Diagnosis not present

## 2015-03-25 LAB — BASIC METABOLIC PANEL
Anion gap: 7 (ref 5–15)
BUN: 25 mg/dL — AB (ref 6–20)
CHLORIDE: 103 mmol/L (ref 101–111)
CO2: 28 mmol/L (ref 22–32)
CREATININE: 1.31 mg/dL — AB (ref 0.44–1.00)
Calcium: 9.8 mg/dL (ref 8.9–10.3)
GFR calc Af Amer: 48 mL/min — ABNORMAL LOW (ref >60)
GFR calc non Af Amer: 41 mL/min — ABNORMAL LOW (ref >60)
GLUCOSE: 92 mg/dL (ref 65–99)
Potassium: 3.9 mmol/L (ref 3.5–5.1)
SODIUM: 138 mmol/L (ref 135–145)

## 2015-03-25 LAB — URINALYSIS, ROUTINE W REFLEX MICROSCOPIC
Bilirubin Urine: NEGATIVE
Glucose, UA: NEGATIVE mg/dL
Ketones, ur: NEGATIVE mg/dL
NITRITE: NEGATIVE
PROTEIN: NEGATIVE mg/dL
SPECIFIC GRAVITY, URINE: 1.015 (ref 1.005–1.030)
pH: 6 (ref 5.0–8.0)

## 2015-03-25 LAB — CBC
HCT: 39.7 % (ref 36.0–46.0)
Hemoglobin: 13.2 g/dL (ref 12.0–15.0)
MCH: 29.3 pg (ref 26.0–34.0)
MCHC: 33.2 g/dL (ref 30.0–36.0)
MCV: 88.2 fL (ref 78.0–100.0)
PLATELETS: 282 10*3/uL (ref 150–400)
RBC: 4.5 MIL/uL (ref 3.87–5.11)
RDW: 12.6 % (ref 11.5–15.5)
WBC: 5.9 10*3/uL (ref 4.0–10.5)

## 2015-03-25 LAB — SURGICAL PCR SCREEN
MRSA, PCR: NEGATIVE
Staphylococcus aureus: NEGATIVE

## 2015-03-25 LAB — URINE MICROSCOPIC-ADD ON

## 2015-03-25 LAB — PROTIME-INR
INR: 1.04 (ref 0.00–1.49)
Prothrombin Time: 13.8 seconds (ref 11.6–15.2)

## 2015-03-25 NOTE — H&P (Signed)
Deanna Burke DOB: 1951-04-21 Single / Language: [name] / Race: White Female  H&P Date: 03/25/15  Chief Complaint: L knee pain   History of Present Illness The patient is a 64 year old female who comes in today for a preoperative History and Physical. The patient is scheduled for a left total knee arthroplasty to be performed by Dr. Johnn Hai, MD at [facility] on April 01, 2015. Konni reports years of pain in bilateral knees, left worse than right currently, refractory to injections, bracing, medications, quad strengthening, activity modifications. Pain and stiffness are interfering with ADLs and quality of life at this point and she desires to proceed with surgery.  Dr. Tonita Cong and the patient mutually agreed to proceed with a total knee replacement. Risks and benefits of the procedure were discussed including stiffness, suboptimal range of motion, persistent pain, infection requiring removal of prosthesis and reinsertion, need for prophylactic antibiotics in the future, for example, dental procedures, possible need for manipulation, revision in the future and also anesthetic complications including DVT, PE, etc. We discussed the perioperative course, time in the hospital, postoperative recovery and the need for elevation to control swelling. We also discussed the predicted range of motion and the probability that squatting and kneeling would be unobtainable in the future. In addition, postoperative anticoagulation was discussed. We have obtained preoperative medical clearance as necessary (Dr. Jana Hakim and Dr. Holley Raring). Provided her illustrated handout and discussed it in detail. They will enroll in the total joint replacement educational forum at the hospital.  Allergies DEMEROL 01/28/1997 DILAUDID 01/28/1997 STADOL 01/28/1997 KEFLEX 01/28/1997 PENICILLIN 01/28/1997 Reglan *GASTROINTESTINAL AGENTS - MISC.*   Family History Heart disease in female family member  before age 65  Kidney disease  grandmother mothers side First Degree Relatives  Heart disease in female family member before age 31  Cancer  grandmother mothers side and grandmother fathers side Drug / Alcohol Addiction  grandfather mothers side Heart Disease  mother, father, brother and grandfather fathers side  Social History Tobacco use  never smoker Alcohol use  never consumed alcohol Marital status  divorced Drug/Alcohol Rehab (Currently)  no Exercise  Exercises weekly; does running / walking Children  0 Number of flights of stairs before winded  2-3 Living situation  live alone, one level home. 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 Aspirin (81MG Tablet, 1 (one) Oral) Active. Vagifem (10MCG Tablet, Vaginal) Active. Potassium Chloride Crys ER (20MEQ Tablet ER, Oral) Active. Tamoxifen Citrate (20MG Tablet, Oral) Active. Medications Reconciled HydrOXYzine HCl (25MG Tablet, Oral qhs) Active. Bismuth Subgallate (200MG Tablet Chewable, Oral 8 daily) Active. Multivitamin Adult (Oral) Specific strength unknown - Active. Diflucan (100MG Tablet, Oral prn) Active. Oxybutynin Chloride (Oral) Specific strength unknown - Active. Acetaminophen (500MG Tablet, Oral as needed) Active. Diclofenac Sodium (1% Gel, Transdermal as needed) Active.  Pregnancy / Birth History Pregnant  no  Past Surgical History Oophorectomy  bilateral Rotator Cuff Repair  right Breast Mass; Local Excision  left Cataract Surgery  left Colectomy  complete Appendectomy  Breast Biopsy  left Kidney Removal - Right  Hysterectomy  partial  (non-cancerous) Exploratory Laparotomy   Past Medical History Ulcerative Colitis  Anemia  Blood Clot  Breast Cancer  Chronic Cystitis  High blood pressure  Chronic Pain  Osteoarthritis  Sleep Apnea  Kidney Stone  Cataract  Phlebitis  Pulmonary Embolism  Urinary Tract Infection  Kidney Disease  Mumps  Measles   Review of Systems General Present- Weight Loss. Not Present- Chills, Fatigue, Fever, Memory Loss, Night Sweats and Weight Gain. Skin Not Present- Eczema, Hives, Itching, Lesions and Rash. HEENT Present- Hearing Loss. Not Present- Dentures, Double Vision, Headache, Tinnitus and Visual Loss. Respiratory Not Present- Allergies, Chronic Cough, Coughing up blood, Shortness of breath at rest and Shortness of breath with exertion. Cardiovascular Not Present- Chest Pain, Difficulty Breathing Lying Down, Murmur, Palpitations, Racing/skipping heartbeats and Swelling. Gastrointestinal Not Present- Abdominal Pain, Bloody Stool, Constipation, Diarrhea, Difficulty Swallowing, Heartburn, Jaundice, Loss of appetitie, Nausea and Vomiting. Female Genitourinary Not Present- Blood in Urine, Discharge, Flank Pain, Incontinence, Painful Urination, Urgency, Urinary frequency, Urinary Retention, Urinating at Night and Weak urinary stream. Musculoskeletal Present- Back Pain, Joint Pain, Joint Swelling and Morning Stiffness. Not Present- Muscle Pain, Muscle Weakness and Spasms. Neurological Not Present- Blackout spells, Difficulty with balance, Dizziness, Paralysis, Tremor and Weakness. Psychiatric Not Present- Insomnia.  Physical Exam General Mental Status -Alert, cooperative and good historian. General Appearance-pleasant, Not in acute distress. Orientation-Oriented X3. Build & Nutrition-Well nourished and Well developed.  Head and Neck Head-normocephalic, atraumatic . Neck Global Assessment - supple, no bruit auscultated on the right, no bruit auscultated on the  left.  Eye Pupil - Bilateral-Regular and Round. Motion - Bilateral-EOMI.  Chest and Lung Exam Auscultation Breath sounds - clear at anterior chest wall and clear at posterior chest wall. Adventitious sounds - No Adventitious sounds.  Cardiovascular Auscultation Rhythm - Regular rate and rhythm. Heart Sounds - S1 WNL and S2 WNL. Murmurs & Other Heart Sounds - Auscultation of the heart reveals - No Murmurs.  Abdomen Palpation/Percussion Tenderness - Abdomen is non-tender to palpation. Rigidity (guarding) - Abdomen is soft. Auscultation Auscultation of the abdomen reveals - Bowel sounds normal.  Female Genitourinary Not done, not pertinent to present illness  Musculoskeletal Note: Exquisite tender in the medial joint line, walks with an antalgic gait, moderate distress. Lacks 5 degrees of extension. Flexes to 90. Patellofemoral pain with compression.  Imaging AP standing and lateral demonstrates bone-on-bone arthrosis. She does have slight valgus.  Assessment & Plan Primary osteoarthritis of left knee (M17.12)  Pt with end-stage L knee DJD, bone-on-bone, refractory to conservative tx, scheduled for L total knee replacement by Dr. Tonita Cong on 04/01/15. We again discussed the procedure itself as well as risks, complications and alternatives, including but not limited to DVT, PE, infx, bleeding, failure of procedure, need for secondary procedure including manipulation, nerve injury, ongoing pain/symptoms, anesthesia risk, even stroke or death. Also discussed typical post-op protocols, activity restrictions, need for PT, flexion/extension exercises, time out of work. Discussed need for DVT ppx post-op with Xarelto then ASA per protocol. Discussed dental ppx. Also discussed limitations post-operatively such as kneeling and squatting. All questions were answered. Patient desires to proceed with surgery as scheduled. Will hold ASA, supplements and NSAIDs accordingly. Will remain NPO after [place]  night before surgery. Will present to [facility] for pre-op testing and we have arranged for a temporary IVC filter to be placed as an outpatient the day before surgery 12/7 (spoke with Tiffany in IR today). Given her multiple allergies will use Vanco peri-operatively. Plan Xarelto for at least 3 weeks post-op for DVT ppx given her DVT/PE hx then ASA. Plan Percocet, Movantik, Colace. Plan initial D/C to [facility]  Place then home with HHPT and eventual outpatient PT which she plans to do here at [facility]. Will follow up 10-14 days post-op for suture removal and xrays. She will call with any questions or concerns in the interim.  Plan Left total knee replacement  Signed electronically by Cecilie Kicks, PA-C for Dr. Tonita Cong

## 2015-03-29 NOTE — Anesthesia Preprocedure Evaluation (Addendum)
Anesthesia Evaluation  Patient identified by MRN, date of birth, ID band Patient awake    Reviewed: Allergy & Precautions, NPO status , Patient's Chart, lab work & pertinent test results  Airway Mallampati: II   Neck ROM: Full    Dental  (+) Dental Advisory Given, Teeth Intact   Pulmonary sleep apnea ,  Previous PE, to have temp IVC placed prior to surg   breath sounds clear to auscultation       Cardiovascular hypertension, Pt. on medications + DVT   Rhythm:Regular  CATH 2013 essentially normal coronaries and good LVF, EKG 03/2015 WNL, Stress Test prior to cath thought to be artifact   Neuro/Psych    GI/Hepatic Neg liver ROS, PUD, illiiostomy, long standing ulcerative colitis   Endo/Other    Renal/GU R hydronephrosis,  Creat 1.3     Musculoskeletal  (+) Arthritis ,   Abdominal (+)  Abdomen: soft.    Peds  Hematology 13/39, Plts 282   Anesthesia Other Findings   Reproductive/Obstetrics                          Anesthesia Physical Anesthesia Plan  ASA: III  Anesthesia Plan: General   Post-op Pain Management:    Induction: Intravenous  Airway Management Planned: Oral ETT  Additional Equipment:   Intra-op Plan:   Post-operative Plan: Extubation in OR  Informed Consent: I have reviewed the patients History and Physical, chart, labs and discussed the procedure including the risks, benefits and alternatives for the proposed anesthesia with the patient or authorized representative who has indicated his/her understanding and acceptance.     Plan Discussed with:   Anesthesia Plan Comments: (Would consider Spinal if she agrees)        Anesthesia Quick Evaluation

## 2015-03-30 ENCOUNTER — Other Ambulatory Visit: Payer: Self-pay | Admitting: Radiology

## 2015-03-31 ENCOUNTER — Encounter (HOSPITAL_COMMUNITY): Payer: Self-pay

## 2015-03-31 ENCOUNTER — Ambulatory Visit (HOSPITAL_COMMUNITY)
Admission: RE | Admit: 2015-03-31 | Discharge: 2015-03-31 | Disposition: A | Discharge status: Home / Self Care | Payer: Medicare Other | Source: Ambulatory Visit | Attending: Specialist | Admitting: Specialist

## 2015-03-31 ENCOUNTER — Ambulatory Visit (HOSPITAL_COMMUNITY)
Admission: RE | Admit: 2015-03-31 | Discharge: 2015-03-31 | Disposition: A | Discharge status: Home / Self Care | Payer: Medicare Other | Source: Ambulatory Visit | Attending: Orthopedic Surgery | Admitting: Orthopedic Surgery

## 2015-03-31 DIAGNOSIS — Z8249 Family history of ischemic heart disease and other diseases of the circulatory system: Secondary | ICD-10-CM | POA: Insufficient documentation

## 2015-03-31 DIAGNOSIS — G4730 Sleep apnea, unspecified: Secondary | ICD-10-CM

## 2015-03-31 DIAGNOSIS — Z880 Allergy status to penicillin: Secondary | ICD-10-CM | POA: Insufficient documentation

## 2015-03-31 DIAGNOSIS — Z7982 Long term (current) use of aspirin: Secondary | ICD-10-CM | POA: Insufficient documentation

## 2015-03-31 DIAGNOSIS — M1712 Unilateral primary osteoarthritis, left knee: Secondary | ICD-10-CM | POA: Insufficient documentation

## 2015-03-31 DIAGNOSIS — Z298 Encounter for other specified prophylactic measures: Secondary | ICD-10-CM

## 2015-03-31 DIAGNOSIS — Z853 Personal history of malignant neoplasm of breast: Secondary | ICD-10-CM | POA: Insufficient documentation

## 2015-03-31 DIAGNOSIS — I10 Essential (primary) hypertension: Secondary | ICD-10-CM | POA: Insufficient documentation

## 2015-03-31 DIAGNOSIS — Z86718 Personal history of other venous thrombosis and embolism: Secondary | ICD-10-CM

## 2015-03-31 DIAGNOSIS — M419 Scoliosis, unspecified: Secondary | ICD-10-CM

## 2015-03-31 DIAGNOSIS — Z86711 Personal history of pulmonary embolism: Secondary | ICD-10-CM | POA: Insufficient documentation

## 2015-03-31 LAB — BASIC METABOLIC PANEL
ANION GAP: 10 (ref 5–15)
BUN: 23 mg/dL — ABNORMAL HIGH (ref 6–20)
CO2: 25 mmol/L (ref 22–32)
Calcium: 9.3 mg/dL (ref 8.9–10.3)
Chloride: 106 mmol/L (ref 101–111)
Creatinine, Ser: 1.28 mg/dL — ABNORMAL HIGH (ref 0.44–1.00)
GFR calc Af Amer: 49 mL/min — ABNORMAL LOW (ref >60)
GFR, EST NON AFRICAN AMERICAN: 43 mL/min — AB (ref >60)
GLUCOSE: 105 mg/dL — AB (ref 65–99)
POTASSIUM: 2.9 mmol/L — AB (ref 3.5–5.1)
Sodium: 141 mmol/L (ref 135–145)

## 2015-03-31 MED ORDER — MIDAZOLAM HCL 2 MG/2ML IJ SOLN
INTRAMUSCULAR | Status: AC
  Filled 2015-03-31: qty 4

## 2015-03-31 MED ORDER — FENTANYL CITRATE (PF) 100 MCG/2ML IJ SOLN
INTRAMUSCULAR | Status: AC | PRN
  Administered 2015-03-31: 50 ug via INTRAVENOUS

## 2015-03-31 MED ORDER — LIDOCAINE-EPINEPHRINE 2 %-1:100000 IJ SOLN
INTRAMUSCULAR | Status: AC
  Filled 2015-03-31: qty 1

## 2015-03-31 MED ORDER — SODIUM CHLORIDE 0.9 % IV SOLN
Freq: Once | INTRAVENOUS | Status: AC
  Administered 2015-03-31: 500 mL via INTRAVENOUS

## 2015-03-31 MED ORDER — FENTANYL CITRATE (PF) 100 MCG/2ML IJ SOLN
INTRAMUSCULAR | Status: AC
  Filled 2015-03-31: qty 2

## 2015-03-31 MED ORDER — MIDAZOLAM HCL 2 MG/2ML IJ SOLN
INTRAMUSCULAR | Status: AC | PRN
  Administered 2015-03-31: 1 mg via INTRAVENOUS

## 2015-03-31 NOTE — Discharge Instructions (Signed)
Inferior Vena Cava Filter Insertion, Care After Refer to this sheet in the next few weeks. These instructions provide you with information on caring for yourself after your procedure. Your health care provider may also give you more specific instructions. Your treatment has been planned according to current medical practices, but problems sometimes occur. Call your health care provider if you have any problems or questions after your procedure. WHAT TO EXPECT AFTER THE PROCEDURE After your procedure, it is typical to have the following:  Mild pain in the area where the filter was inserted.  Mild bruising in the area where the filter was inserted. HOME CARE INSTRUCTIONS  You will be given medicine to control pain. Only take over-the-counter or prescription medicines for pain, fever, or discomfort as directed by your health care provider.  A bandage (dressing) has been placed over the insertion site. Follow your health care provider's instructions on how to care for it.  Keep the insertion site clean and dry.  Do not soak in a bath tub or pool until the filter insertion site has healed.  Do not drive if you are taking narcotic pain medicines. Follow your health care provider's instructions about driving.  Do not return to work or school until your health care provider says it is okay.   Keep all follow-up appointments.  SEEK IMMEDIATE MEDICAL CARE IF:  You develop swelling and discoloration or pain in the legs.  Your legs become pale and cold or blue.  You develop shortness of breath, feel faint, or pass out.  You develop chest pain, a cough, or difficulty breathing.  You cough up blood.  You develop a rash or feel you are having problems that may be a side effect of medicines.  You develop weakness, difficulty moving your arms or legs, or balance problems.  You develop problems with speech or vision.   This information is not intended to replace advice given to you by your  health care provider. Make sure you discuss any questions you have with your health care provider.   Document Released: 01/29/2013 Document Reviewed: 01/29/2013 Elsevier Interactive Patient Education 2016 [employer]. Inferior Vena Cava Filter Insertion Insertion of an inferior vena cava (IVC) filter is a procedure in which a filter is placed into the large vein in your abdomen that carries blood from the lower part of your body to your heart (inferior vena cava). Placement of the filter here helps prevent blood clots in the legs or pelvis from traveling to your lungs. A large blood clot in the lungs can cause death.  The filter is a small, metal device about an inch long. It is shaped like the spokes of an umbrella and is inserted through a pathway created in your neck or groin. The risks of this procedure are usually small and easily managed. Inferior vena cava filters are only used when blood thinners (anticoagulants) cannot be used to prevent blood clots from forming. This may occur because of:  You have severe platelet problems or shortage.  You have had recent or current major bleeding that cannot be treated.  You have bleeding associated with anticoagulants.  You have recurrence of blood clots while on anticoagulants.  You have a need for surgery in the near future.  You have bleeding in your head. EXPECTATIONS OF A FILTER  An IVC filter will reduce the risk of a large blood clot making its way to your lungs (pulmonary embolus, or PE). It cannot eliminate the risk completely, or prevent  small PEs from occurring.  It does not stop blood clots from growing, recurring, or developing into postphlebitic syndrome. This is a condition that can occur after there is inflammation in a vein (phlebitis). LET [facility] CARE PROVIDER KNOW ABOUT:  Any allergies you have. This includes an allergy to iodine or contrast dye.  All medicines you are taking, including vitamins, herbs, eye drops,  creams and over-the-counter medicines.  Previous problems you or members of your family have had with the use of anesthetics.  Any blood disorders you have.  Previous surgeries you had had.  Medical conditions you have.  Possibility of pregnancy, if this applies. RISKS AND COMPLICATIONS Generally, this is a safe procedure. However, as with any procedure, problems can occur. Possible problems include:  A small bruise around the needle insertion site. A larger pooling of blood called a hematoma may form. This is usually of no concern.  The filter can block the vena cava. This can cause some swelling of the legs.  The filter may eventually fail and not work properly.  Continued bleeding or infections (uncommon).  Damage to the vein by the catheter (rare). BEFORE THE PROCEDURE  Your health care provider may want you to have blood tests. These tests can help tell how well your kidneys and liver are working. They can also show how well your blood clots.  If you take blood thinners, ask your health care provider if and when you should stop taking them.  Do not eat or drink for 4 hours before the procedure or as directed by your health care provider.  Make arrangements for someone to drive you home. Depending on the procedure, you may be able to go home the same day.  PROCEDURE   Insertion of a Vena Cava filter is performed by a vascular surgeon or cardiologist in a cardiac catheterization lab.  The procedure usually takes about 30 minutes to 1 hour. This can vary.  An IV needle will be inserted into one of your veins. Medicine will be able to flow directly into your body through this needle.  Medicines may given to help you relax and relieve anxiety (sedative).  The procedure is done through a large vein either in your neck or groin. The skin around this area is cleaned and shaved, as necessary.  The skin and deeper tissues over the vein will be made numb with a local  anesthetic. You are awake during the procedure and can let your health care providers know if you have discomfort.  A needle is then put into the vein. A guide wire is placed through the needle and into the vein. This is used to help insert a catheter and the IVC filter into your vein.  Contrast dye may be injected into the inferior vena cava to help guide the catheter and verify precise placement of the IVC filter. X-ray equipment may also be used to verify that the catheter and the wire are in the correct position.  The wire is then withdrawn.  The IVC filter is then passed over the catheter into the vein, and inserted into the correct location in the vena cava.  The catheter is then removed. Pressure will be kept on the needle insertion point for several minutes or until it is unlikely to bleed. AFTER THE PROCEDURE  You will stay in a recovery area until any sedation medicine you were given has worn off. Your blood pressure and pulse will be checked.  If there are no problems,  you should be able to go home after the procedure.  You may feel sore at the area of the needle insertion for a few days.   This information is not intended to replace advice given to you by your health care provider. Make sure you discuss any questions you have with your health care provider.   Document Released: 05/31/2005 Document Revised: 05/01/2014 Document Reviewed: 12/16/2012 Elsevier Interactive Patient Education 2016 Elsevier Inc. Moderate Conscious Sedation, Adult Sedation is the use of medicines to promote relaxation and relieve discomfort and anxiety. Moderate conscious sedation is a type of sedation. Under moderate conscious sedation you are less alert than normal but are still able to respond to instructions or stimulation. Moderate conscious sedation is used during short medical and dental procedures. It is milder than deep sedation or general anesthesia and allows you to return to your regular  activities sooner. LET [facility] CARE PROVIDER KNOW ABOUT:   Any allergies you have.  All medicines you are taking, including vitamins, herbs, eye drops, creams, and over-the-counter medicines.  Use of steroids (by mouth or creams).  Previous problems you or members of your family have had with the use of anesthetics.  Any blood disorders you have.  Previous surgeries you have had.  Medical conditions you have.  Possibility of pregnancy, if this applies.  Use of cigarettes, alcohol, or illegal drugs. RISKS AND COMPLICATIONS Generally, this is a safe procedure. However, as with any procedure, problems can occur. Possible problems include:  Oversedation.  Trouble breathing on your own. You may need to have a breathing tube until you are awake and breathing on your own.  Allergic reaction to any of the medicines used for the procedure. BEFORE THE PROCEDURE  You may have blood tests done. These tests can help show how well your kidneys and liver are working. They can also show how well your blood clots.  A physical exam will be done.  Only take medicines as directed by your health care provider. You may need to stop taking medicines (such as blood thinners, aspirin, or nonsteroidal anti-inflammatory drugs) before the procedure.   Do not eat or drink at least 6 hours before the procedure or as directed by your health care provider.  Arrange for a responsible adult, family member, or friend to take you home after the procedure. He or she should stay with you for at least 24 hours after the procedure, until the medicine has worn off. PROCEDURE   An intravenous (IV) catheter will be inserted into one of your veins. Medicine will be able to flow directly into your body through this catheter. You may be given medicine through this tube to help prevent pain and help you relax.  The medical or dental procedure will be done. AFTER THE PROCEDURE  You will stay in a recovery area  until the medicine has worn off. Your blood pressure and pulse will be checked.   Depending on the procedure you had, you may be allowed to go home when you can tolerate liquids and your pain is under control.   This information is not intended to replace advice given to you by your health care provider. Make sure you discuss any questions you have with your health care provider.   Document Released: 01/03/2001 Document Revised: 05/01/2014 Document Reviewed: 12/16/2012 Elsevier Interactive Patient Education [employer].

## 2015-03-31 NOTE — H&P (Signed)
Chief Complaint: Patient was seen in consultation today for IVC filter placement  Referring Physician(s): Bissell,Jaclyn M  History of Present Illness: Deanna Burke is a 64 y.o. female who is to have (L)TKA tomorrow. 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 She is deemed high risk for VTE and IR is requested to place retrievable IVC filter prior to surgery. PMHx, meds, labs, allergies reviewed. Has been NPO this am Feels well otherwise  Past Medical History  Diagnosis Date  . Ulcerative colitis [id number]  . Pulmonary embolus ([place]) 1971  . Phlebitis 1971  . Small bowel obstruction ([place]) [id number]  . Small bowel perforation ([place]) 1971/1996  . Large bowel perforation ([place]) 1971/1996  . Scoliosis   . Torn rotator cuff 2005  . History of bone density study 2012  . Fatigue   . Cataract     Left Eye  . Hot flashes   . 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 Hypertension   . Cancer (HCC)     DCIS L breast  . H/O ulcerative colitis   . Breast cancer ([place])   . Arthritis     hands, lumbar spine, hips, right ankle  . S/P ileostomy ([place])   . S/P radiation therapy 07/24/11 - 09/06/11    Left Breast/ 5000 cGy in 25 Fractions with Boost of 1000 cGy in 5 Fractions  . 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 Abnormal finding on cardiovascular stress test, ischemia anterolateral 02/13/2012    follow heart cath - pt told one tiny blockage that didn't even matter  . Syncope, ? anginal equivilant 02/13/2012  . Ileostomy in place, secondary to ulcerative colitis x 20 years 02/13/2012  . History of recurrent UTIs 02/13/2012  . Sleep apnea 2009    uses cpap-setting is 1  . H/O nephrostomy 08/13/12    Past Surgical History  Procedure Laterality Date  . Colectomy  1973  . Ileostomy  1973  . Vaginoplasty  1975  . Exploratory laparotomy   1978/1990    with lysis of adhesions  . Eye surgery  05/08/2011    left cataract removal   . Appendectomy    . Rotator cuff repair  2006    Right  . Breast surgery  05/03/11    LEFT BREAST NEEDLE CORE BIOPSY- DCIS  . 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  ER:+, PR+,   . Abdominal hysterectomy  1996    partial  . Exploratory laps      several due to abd pain related to ulcerative colitis  . Cardiac catheterization    . Laparoscopic nephrectomy Right 08/14/2012    Procedure: RIGHT LAPAROSCOPIC RETROPERITONEAL LAPAROSCOPIC NEPHRECTOMY ;  Surgeon: Alexis Frock, MD;  Location: [facility] ORS;  Service: Urology;  Laterality: Right;  RIGHT LAPAROSCOPIC RETROPERITONEAL LAPAROSCOPIC NEPHRECTOMY, POSSIBLE OPEN   . Left heart catheterization with coronary angiogram N/A 02/13/2012    Procedure: LEFT HEART CATHETERIZATION WITH CORONARY ANGIOGRAM;  Surgeon: Lorretta Harp, MD;  Location[facility] CATH LAB;  Service: Cardiovascular;  Laterality: N/A;    Allergies: Demerol; Dilaudid; Reglan; Stadol; Cephalexin; and Penicillins  Medications: Prior to Admission medications   Medication Sig Start Date End Date Taking? Authorizing Provider  acetaminophen (TYLENOL)  500 MG tablet Take 1,000 mg by mouth every 6 (six) hours as needed for moderate pain.   Yes Historical Provider, MD  Bismuth Subgallate (DEVROM) 200 MG CHEW Chew 400 mg by mouth 4 (four) times daily. For odor control - pt has ileostomy   Yes Historical Provider, MD  ciprofloxacin (CIPRO) 250 MG tablet Take 250 mg by mouth 2 (two) times daily.   Yes Historical Provider, MD  diclofenac sodium (VOLTAREN) 1 % GEL Apply 2 g topically 4 (four) times daily as needed (for pain). For shoulder/ joint pain   Yes Historical Provider, MD  hydrOXYzine (ATARAX/VISTARIL) 25 MG tablet Take 25 mg by mouth at bedtime.   Yes Historical Provider, MD  potassium chloride SA  (K-DUR,KLOR-CON) 20 MEQ tablet Take 1 tablet (20 mEq total) by mouth daily. 06/23/14  Yes Chauncey Cruel, MD  tamoxifen (NOLVADEX) 20 MG tablet Take 1 tablet (20 mg total) by mouth daily. 05/11/14  Yes Chauncey Cruel, MD  triamterene-hydrochlorothiazide (MAXZIDE-25) 37.5-25 MG per tablet Take 1 tablet by mouth every morning.   Yes Historical Provider, MD  aspirin 81 MG tablet Take 81 mg by mouth daily.    Historical Provider, MD  Estradiol (VAGIFEM) 10 MCG TABS vaginal tablet Place 1 tablet (10 mcg total) vaginally 2 (two) times a week. Patient taking differently: Place 1 tablet vaginally every other day.  05/11/14   Chauncey Cruel, MD  Estradiol 10 MCG TABS vaginal tablet Place vaginally.    Historical Provider, MD  fluconazole (DIFLUCAN) 100 MG tablet Take 1 tablet (100 mg total) by mouth daily. Patient taking differently: Take 100 mg by mouth daily. Take for 3 days 05/11/14   Chauncey Cruel, MD  Multiple Vitamins-Minerals (MULTIVITAMIN WITH MINERALS) tablet Take 1 tablet by mouth daily.    Historical Provider, MD  oxybutynin (DITROPAN) 5 MG tablet Take 5 mg by mouth every 8 (eight) hours as needed for bladder spasms.  09/01/13   Historical Provider, MD     Family History  Problem Relation Age of Onset  . Breast cancer Maternal Grandmother   . Stomach cancer Maternal Grandfather   . Cancer Paternal Grandmother     stomach  . Cancer Other     Breast  . Atrial fibrillation Mother   . Heart attack Father   . Atrial fibrillation Brother   . Heart attack Paternal Uncle     Social History   Social History  . Marital Status: Divorced    Spouse Name: N/A  . Number of Children: N/A  . Years of Education: N/A   Social History Main Topics  . Smoking status: Never Smoker   . Smokeless tobacco: Never Used  . Alcohol Use: No  . Drug Use: No  . Sexual Activity: No     Comment: MENARCHE 9, Nulliparity, MENOPAUSE 1996, HRT X 16 YEARS- STOPPED 04/21/11, BC X 2 YEARS   Other Topics  Concern  . None   Social History Narrative   Lives alone.  Does not use any assist device.       Review of Systems: A 12 point ROS discussed and pertinent positives are indicated in the HPI above.  All other systems are negative.  Review of Systems  Vital Signs: Temp: 98, HR: 94, RR: 16, BP: 138/65  Physical Exam  Constitutional: She is oriented to person, place, and time. She appears well-developed and well-nourished. No distress.  HENT:  Head: Normocephalic.  Mouth/Throat: Oropharynx is clear and moist.  Neck: Normal range  of motion. No JVD present. No tracheal deviation present.  Cardiovascular: Normal rate, regular rhythm and normal heart sounds.   Pulmonary/Chest: Effort normal and breath sounds normal. No respiratory distress.  Abdominal: Soft. There is no tenderness.  Neurological: She is alert and oriented to person, place, and time.  Psychiatric: She has a normal mood and affect. Judgment normal.    Mallampati Score:  MD Evaluation Airway: WNL Heart: WNL Abdomen: WNL Chest/ Lungs: WNL ASA  Classification: 2 Mallampati/Airway Score: One  Imaging: Dg Knee 1-2 Views Left  03/25/2015  CLINICAL DATA:  Preop for left knee replacement, left knee pain EXAM: LEFT KNEE - 1-2 VIEW COMPARISON:  None. FINDINGS: There is age advanced tricompartmental degenerative joint disease of the left knee primarily involving the medial and patellofemoral compartments where there is more loss of joint space and sclerosis with spurring. No acute fracture is seen. A tiny amount of joint fluid cannot be excluded. There does appear to be faint chondrocalcinosis present and CPPD is a consideration. IMPRESSION: 1. 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 2. 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 Electronically Signed   By: Ivar Drape M.D.   On: 03/25/2015 16:19    Labs:  CBC:  Recent Labs  05/04/14 1030 03/25/15 1145  WBC 5.4 5.9  HGB 13.4 13.2   HCT 40.8 39.7  PLT 297 282    COAGS:  Recent Labs  03/25/15 1145  INR 1.04    BMP:  Recent Labs  05/04/14 1031 03/25/15 1145  NA 139 138  K 4.1 3.9  CL  --  103  CO2 28 28  GLUCOSE 88 92  BUN 20.5 25*  CALCIUM 9.5 9.8  CREATININE 1.2* 1.31*  GFRNONAA  --  41*  GFRAA  --  48*    LIVER FUNCTION TESTS:  Recent Labs  05/04/14 1031  BILITOT 0.74  AST 19  ALT 14  ALKPHOS 73  PROT 7.5  ALBUMIN 3.6    Assessment and Plan: Hx DVT/PE Plan for (L)total knee arthroplasty tomorrow, high risk for DVT IVC filter today Labs reviewed, await, BMET Risks and Benefits discussed with the patient including, but not limited to bleeding, infection, contrast induced renal failure, filter fracture or migration which can lead to emergency surgery or even death, strut penetration with damage or irritation to adjacent structures and caval thrombosis. All of the patient's questions were answered, patient is agreeable to proceed. Consent signed and in chart.    Thank you for this interesting consult.  I greatly enjoyed meeting DEMARIA DEENEY and look forward to participating in their care.  A copy of this report was sent to the requesting provider on this date.  SignedAscencion Dike 03/31/2015, 10:06 AM   I spent a total of 15 minutes in face to face in clinical consultation, greater than 50% of which was counseling/coordinating care for IVC filter placement

## 2015-03-31 NOTE — Procedures (Signed)
Successful placement of an infrarenal IVC filter.  No immediate complications.   Ronny Bacon, MD Pager #: [phone]

## 2015-04-01 ENCOUNTER — Encounter (HOSPITAL_COMMUNITY)
Admission: RE | Disposition: A | Discharge status: Skilled Nursing Facility | Payer: Self-pay | Source: Ambulatory Visit | Attending: Specialist

## 2015-04-01 ENCOUNTER — Inpatient Hospital Stay (HOSPITAL_COMMUNITY): Payer: Medicare Other

## 2015-04-01 ENCOUNTER — Encounter (HOSPITAL_COMMUNITY): Payer: Self-pay | Admitting: *Deleted

## 2015-04-01 ENCOUNTER — Inpatient Hospital Stay (HOSPITAL_COMMUNITY): Payer: Medicare Other | Admitting: Anesthesiology

## 2015-04-01 ENCOUNTER — Inpatient Hospital Stay (HOSPITAL_COMMUNITY)
Admission: RE | Admit: 2015-04-01 | Discharge: 2015-04-05 | DRG: 470 | Disposition: A | Discharge status: Skilled Nursing Facility | Payer: Medicare Other | Source: Ambulatory Visit | Attending: Specialist | Admitting: Specialist

## 2015-04-01 DIAGNOSIS — N390 Urinary tract infection, site not specified: Secondary | ICD-10-CM | POA: Diagnosis not present

## 2015-04-01 DIAGNOSIS — I10 Essential (primary) hypertension: Secondary | ICD-10-CM | POA: Diagnosis present

## 2015-04-01 DIAGNOSIS — Z853 Personal history of malignant neoplasm of breast: Secondary | ICD-10-CM

## 2015-04-01 DIAGNOSIS — Z86718 Personal history of other venous thrombosis and embolism: Secondary | ICD-10-CM | POA: Diagnosis not present

## 2015-04-01 DIAGNOSIS — Z905 Acquired absence of kidney: Secondary | ICD-10-CM | POA: Diagnosis not present

## 2015-04-01 DIAGNOSIS — Z86711 Personal history of pulmonary embolism: Secondary | ICD-10-CM | POA: Diagnosis not present

## 2015-04-01 DIAGNOSIS — M25562 Pain in left knee: Secondary | ICD-10-CM | POA: Diagnosis present

## 2015-04-01 DIAGNOSIS — G4730 Sleep apnea, unspecified: Secondary | ICD-10-CM | POA: Diagnosis present

## 2015-04-01 DIAGNOSIS — Z96659 Presence of unspecified artificial knee joint: Secondary | ICD-10-CM

## 2015-04-01 DIAGNOSIS — M1712 Unilateral primary osteoarthritis, left knee: Principal | ICD-10-CM | POA: Diagnosis present

## 2015-04-01 DIAGNOSIS — Z923 Personal history of irradiation: Secondary | ICD-10-CM

## 2015-04-01 DIAGNOSIS — Z79899 Other long term (current) drug therapy: Secondary | ICD-10-CM | POA: Diagnosis not present

## 2015-04-01 HISTORY — PX: TOTAL KNEE ARTHROPLASTY: SHX125

## 2015-04-01 LAB — TYPE AND SCREEN
ABO/RH(D): O POS
Antibody Screen: NEGATIVE

## 2015-04-01 LAB — POTASSIUM: Potassium: 3.3 mmol/L — ABNORMAL LOW (ref 3.5–5.1)

## 2015-04-01 SURGERY — ARTHROPLASTY, KNEE, TOTAL
Anesthesia: General | Site: Knee | Laterality: Left

## 2015-04-01 MED ORDER — FLUCONAZOLE 100 MG PO TABS: 100.0000 mg | ORAL_TABLET | Freq: Every day | ORAL | Status: DC

## 2015-04-01 MED ORDER — PROPOFOL 10 MG/ML IV BOLUS
INTRAVENOUS | Status: AC
  Filled 2015-04-01: qty 20

## 2015-04-01 MED ORDER — CISATRACURIUM BESYLATE 20 MG/10ML IV SOLN
INTRAVENOUS | Status: AC
  Filled 2015-04-01: qty 10

## 2015-04-01 MED ORDER — BISACODYL 5 MG PO TBEC: 5.0000 mg | DELAYED_RELEASE_TABLET | Freq: Every day | ORAL | Status: DC | PRN

## 2015-04-01 MED ORDER — PROPOFOL 10 MG/ML IV BOLUS
INTRAVENOUS | Status: AC
  Filled 2015-04-01: qty 40

## 2015-04-01 MED ORDER — ACETAMINOPHEN 10 MG/ML IV SOLN
1000.0000 mg | Freq: Once | INTRAVENOUS | Status: AC
  Administered 2015-04-01: 1000 mg via INTRAVENOUS

## 2015-04-01 MED ORDER — MORPHINE SULFATE (PF) 2 MG/ML IV SOLN
1.0000 mg | INTRAVENOUS | Status: DC | PRN
  Administered 2015-04-01 – 2015-04-02 (×7): 1 mg via INTRAVENOUS
  Filled 2015-04-01 (×6): qty 1

## 2015-04-01 MED ORDER — RIVAROXABAN 10 MG PO TABS: 10.0000 mg | ORAL_TABLET | Freq: Every day | ORAL | Status: DC

## 2015-04-01 MED ORDER — ROCURONIUM BROMIDE 100 MG/10ML IV SOLN
INTRAVENOUS | Status: AC
  Filled 2015-04-01: qty 1

## 2015-04-01 MED ORDER — ESTRADIOL 10 MCG VA TABS: ORAL_TABLET | Freq: Every morning | VAGINAL | Status: DC

## 2015-04-01 MED ORDER — BUPIVACAINE HCL (PF) 0.5 % IJ SOLN
INTRAMUSCULAR | Status: DC | PRN
  Administered 2015-04-01: 3 mL

## 2015-04-01 MED ORDER — PROPOFOL 10 MG/ML IV BOLUS
INTRAVENOUS | Status: DC | PRN
  Administered 2015-04-01: 30 mg via INTRAVENOUS

## 2015-04-01 MED ORDER — ACETAMINOPHEN 325 MG PO TABS: 650.0000 mg | ORAL_TABLET | Freq: Four times a day (QID) | ORAL | Status: DC | PRN

## 2015-04-01 MED ORDER — HYDROXYZINE HCL 25 MG PO TABS
25.0000 mg | ORAL_TABLET | Freq: Every day | ORAL | Status: DC
  Administered 2015-04-01 – 2015-04-04 (×4): 25 mg via ORAL
  Filled 2015-04-01 (×7): qty 1

## 2015-04-01 MED ORDER — ESTRADIOL 10 MCG VA TABS: 1.0000 | ORAL_TABLET | VAGINAL | Status: DC

## 2015-04-01 MED ORDER — RIVAROXABAN 10 MG PO TABS
10.0000 mg | ORAL_TABLET | Freq: Every day | ORAL | Status: DC
  Administered 2015-04-02 – 2015-04-05 (×4): 10 mg via ORAL
  Filled 2015-04-01 (×6): qty 1

## 2015-04-01 MED ORDER — POTASSIUM CHLORIDE CRYS ER 20 MEQ PO TBCR
20.0000 meq | EXTENDED_RELEASE_TABLET | Freq: Every day | ORAL | Status: DC
  Administered 2015-04-01 – 2015-04-05 (×5): 20 meq via ORAL
  Filled 2015-04-01 (×5): qty 1

## 2015-04-01 MED ORDER — VANCOMYCIN HCL IN DEXTROSE 1-5 GM/200ML-% IV SOLN
1000.0000 mg | Freq: Two times a day (BID) | INTRAVENOUS | Status: AC
  Administered 2015-04-01: 1000 mg via INTRAVENOUS
  Filled 2015-04-01: qty 200

## 2015-04-01 MED ORDER — ONDANSETRON HCL 4 MG PO TABS: 4.0000 mg | ORAL_TABLET | Freq: Four times a day (QID) | ORAL | Status: DC | PRN

## 2015-04-01 MED ORDER — BISMUTH SUBGALLATE 200 MG PO CHEW
400.0000 mg | CHEWABLE_TABLET | Freq: Four times a day (QID) | ORAL | Status: DC
  Administered 2015-04-02 (×2): 400 mg via ORAL

## 2015-04-01 MED ORDER — TAMOXIFEN CITRATE 20 MG PO TABS
20.0000 mg | ORAL_TABLET | Freq: Every day | ORAL | Status: DC
  Administered 2015-04-02 – 2015-04-04 (×3): 20 mg via ORAL
  Filled 2015-04-01 (×5): qty 1

## 2015-04-01 MED ORDER — MENTHOL 3 MG MT LOZG: 1.0000 | LOZENGE | OROMUCOSAL | Status: DC | PRN

## 2015-04-01 MED ORDER — DEXAMETHASONE SODIUM PHOSPHATE 10 MG/ML IJ SOLN
INTRAMUSCULAR | Status: DC | PRN
  Administered 2015-04-01: 10 mg via INTRAVENOUS

## 2015-04-01 MED ORDER — NALOXEGOL OXALATE 25 MG PO TABS
25.0000 mg | ORAL_TABLET | Freq: Every day | ORAL | Status: DC
  Administered 2015-04-01: 25 mg via ORAL
  Filled 2015-04-01 (×2): qty 1

## 2015-04-01 MED ORDER — FENTANYL CITRATE (PF) 250 MCG/5ML IJ SOLN
INTRAMUSCULAR | Status: AC
Start: 2015-04-01 — End: 2015-04-01
  Filled 2015-04-01: qty 5

## 2015-04-01 MED ORDER — MIDAZOLAM HCL 2 MG/2ML IJ SOLN
INTRAMUSCULAR | Status: AC
Start: 2015-04-01 — End: 2015-04-01
  Filled 2015-04-01: qty 2

## 2015-04-01 MED ORDER — ONDANSETRON HCL 4 MG/2ML IJ SOLN
INTRAMUSCULAR | Status: AC
  Filled 2015-04-01: qty 2

## 2015-04-01 MED ORDER — PROPOFOL 500 MG/50ML IV EMUL
INTRAVENOUS | Status: DC | PRN
  Administered 2015-04-01: 100 ug/kg/min via INTRAVENOUS

## 2015-04-01 MED ORDER — RISAQUAD PO CAPS
1.0000 | ORAL_CAPSULE | Freq: Every day | ORAL | Status: DC
  Administered 2015-04-02 – 2015-04-05 (×4): 1 via ORAL
  Filled 2015-04-01 (×4): qty 1

## 2015-04-01 MED ORDER — SODIUM CHLORIDE 0.9 % IR SOLN
Status: DC | PRN
  Administered 2015-04-01: 500 mL

## 2015-04-01 MED ORDER — METOCLOPRAMIDE HCL 10 MG PO TABS: 5.0000 mg | ORAL_TABLET | Freq: Three times a day (TID) | ORAL | Status: DC | PRN

## 2015-04-01 MED ORDER — METHOCARBAMOL 1000 MG/10ML IJ SOLN
500.0000 mg | Freq: Four times a day (QID) | INTRAVENOUS | Status: DC | PRN
  Administered 2015-04-01: 500 mg via INTRAVENOUS
  Filled 2015-04-01 (×2): qty 5

## 2015-04-01 MED ORDER — PHENOL 1.4 % MT LIQD: 1.0000 | OROMUCOSAL | Status: DC | PRN

## 2015-04-01 MED ORDER — SODIUM CHLORIDE 0.9 % IJ SOLN
INTRAMUSCULAR | Status: AC
Start: 2015-04-01 — End: 2015-04-01
  Filled 2015-04-01: qty 10

## 2015-04-01 MED ORDER — SODIUM CHLORIDE 0.9 % IR SOLN
Status: AC
  Filled 2015-04-01: qty 1

## 2015-04-01 MED ORDER — LIDOCAINE HCL (CARDIAC) 20 MG/ML IV SOLN
INTRAVENOUS | Status: AC
  Filled 2015-04-01: qty 5

## 2015-04-01 MED ORDER — ACETAMINOPHEN 650 MG RE SUPP: 650.0000 mg | Freq: Four times a day (QID) | RECTAL | Status: DC | PRN

## 2015-04-01 MED ORDER — KCL IN DEXTROSE-NACL 20-5-0.45 MEQ/L-%-% IV SOLN
INTRAVENOUS | Status: AC
  Filled 2015-04-01: qty 1000

## 2015-04-01 MED ORDER — TRANEXAMIC ACID 1000 MG/10ML IV SOLN
2000.0000 mg | Freq: Once | INTRAVENOUS | Status: DC
  Filled 2015-04-01: qty 20

## 2015-04-01 MED ORDER — PROMETHAZINE HCL 25 MG/ML IJ SOLN: 6.2500 mg | INTRAMUSCULAR | Status: DC | PRN

## 2015-04-01 MED ORDER — OXYCODONE HCL 5 MG PO TABS
5.0000 mg | ORAL_TABLET | ORAL | Status: DC | PRN
  Administered 2015-04-01 (×3): 10 mg via ORAL
  Administered 2015-04-02: 5 mg via ORAL
  Administered 2015-04-02 – 2015-04-05 (×16): 10 mg via ORAL
  Filled 2015-04-01 (×8): qty 2
  Filled 2015-04-01: qty 1
  Filled 2015-04-01 (×13): qty 2

## 2015-04-01 MED ORDER — METHOCARBAMOL 500 MG PO TABS
500.0000 mg | ORAL_TABLET | Freq: Four times a day (QID) | ORAL | Status: DC | PRN
  Administered 2015-04-01 – 2015-04-05 (×6): 500 mg via ORAL
  Filled 2015-04-01 (×6): qty 1

## 2015-04-01 MED ORDER — EPHEDRINE SULFATE 50 MG/ML IJ SOLN
INTRAMUSCULAR | Status: AC
Start: 2015-04-01 — End: 2015-04-01
  Filled 2015-04-01: qty 1

## 2015-04-01 MED ORDER — ONDANSETRON HCL 4 MG/2ML IJ SOLN
INTRAMUSCULAR | Status: DC | PRN
  Administered 2015-04-01: 4 mg via INTRAVENOUS

## 2015-04-01 MED ORDER — VANCOMYCIN HCL IN DEXTROSE 1-5 GM/200ML-% IV SOLN
1000.0000 mg | INTRAVENOUS | Status: AC
  Administered 2015-04-01: 1000 mg via INTRAVENOUS
  Filled 2015-04-01: qty 200

## 2015-04-01 MED ORDER — MIDAZOLAM HCL 5 MG/5ML IJ SOLN
INTRAMUSCULAR | Status: DC | PRN
  Administered 2015-04-01: 2 mg via INTRAVENOUS

## 2015-04-01 MED ORDER — ACETAMINOPHEN 500 MG PO TABS
1000.0000 mg | ORAL_TABLET | Freq: Four times a day (QID) | ORAL | Status: DC | PRN
Start: 2015-04-01 — End: 2015-04-05

## 2015-04-01 MED ORDER — DOCUSATE SODIUM 100 MG PO CAPS
100.0000 mg | ORAL_CAPSULE | Freq: Two times a day (BID) | ORAL | Status: DC
  Administered 2015-04-02 – 2015-04-03 (×2): 100 mg via ORAL

## 2015-04-01 MED ORDER — BUPIVACAINE-EPINEPHRINE 0.25% -1:200000 IJ SOLN
INTRAMUSCULAR | Status: AC
  Filled 2015-04-01: qty 1

## 2015-04-01 MED ORDER — SODIUM CHLORIDE 0.9 % IR SOLN
Status: DC | PRN
  Administered 2015-04-01: 2000 mL

## 2015-04-01 MED ORDER — SENNOSIDES-DOCUSATE SODIUM 8.6-50 MG PO TABS: 1.0000 | ORAL_TABLET | Freq: Every evening | ORAL | Status: DC | PRN

## 2015-04-01 MED ORDER — ACETAMINOPHEN 500 MG PO TABS
1000.0000 mg | ORAL_TABLET | Freq: Four times a day (QID) | ORAL | Status: AC
  Administered 2015-04-01 – 2015-04-02 (×4): 1000 mg via ORAL
  Filled 2015-04-01 (×5): qty 2

## 2015-04-01 MED ORDER — BUPIVACAINE-EPINEPHRINE 0.25% -1:200000 IJ SOLN
INTRAMUSCULAR | Status: DC | PRN
  Administered 2015-04-01: 40 mL

## 2015-04-01 MED ORDER — OXYBUTYNIN CHLORIDE 5 MG PO TABS
5.0000 mg | ORAL_TABLET | Freq: Three times a day (TID) | ORAL | Status: DC | PRN
  Filled 2015-04-01: qty 1

## 2015-04-01 MED ORDER — MAGNESIUM CITRATE PO SOLN: 1.0000 | Freq: Once | ORAL | Status: DC | PRN

## 2015-04-01 MED ORDER — KCL IN DEXTROSE-NACL 20-5-0.45 MEQ/L-%-% IV SOLN
INTRAVENOUS | Status: AC
  Administered 2015-04-01 – 2015-04-02 (×2): via INTRAVENOUS
  Filled 2015-04-01 (×2): qty 1000

## 2015-04-01 MED ORDER — DOCUSATE SODIUM 100 MG PO CAPS: 100.0000 mg | ORAL_CAPSULE | Freq: Two times a day (BID) | ORAL | Status: DC | PRN

## 2015-04-01 MED ORDER — MULTI-VITAMIN/MINERALS PO TABS
1.0000 | ORAL_TABLET | Freq: Every day | ORAL | Status: DC
  Administered 2015-04-01 – 2015-04-02 (×2): 1 via ORAL
  Filled 2015-04-01 (×5): qty 1

## 2015-04-01 MED ORDER — TRANEXAMIC ACID 1000 MG/10ML IV SOLN
2000.0000 mg | INTRAVENOUS | Status: DC | PRN
  Administered 2015-04-01: 2000 mg via TOPICAL

## 2015-04-01 MED ORDER — ACETAMINOPHEN 10 MG/ML IV SOLN
INTRAVENOUS | Status: AC
  Filled 2015-04-01: qty 100

## 2015-04-01 MED ORDER — FENTANYL CITRATE (PF) 100 MCG/2ML IJ SOLN: 25.0000 ug | INTRAMUSCULAR | Status: DC | PRN

## 2015-04-01 MED ORDER — LACTATED RINGERS IV SOLN
INTRAVENOUS | Status: DC
  Administered 2015-04-01 (×2): via INTRAVENOUS

## 2015-04-01 MED ORDER — OXYCODONE-ACETAMINOPHEN 5-325 MG PO TABS: 1.0000 | ORAL_TABLET | ORAL | Status: DC | PRN

## 2015-04-01 MED ORDER — ALUM & MAG HYDROXIDE-SIMETH 200-200-20 MG/5ML PO SUSP: 30.0000 mL | ORAL | Status: DC | PRN

## 2015-04-01 MED ORDER — METOCLOPRAMIDE HCL 5 MG/ML IJ SOLN: 5.0000 mg | Freq: Three times a day (TID) | INTRAMUSCULAR | Status: DC | PRN

## 2015-04-01 MED ORDER — ONDANSETRON HCL 4 MG/2ML IJ SOLN
4.0000 mg | Freq: Four times a day (QID) | INTRAMUSCULAR | Status: DC | PRN
  Administered 2015-04-02: 4 mg via INTRAVENOUS
  Filled 2015-04-01: qty 2

## 2015-04-01 MED ORDER — DIPHENHYDRAMINE HCL 12.5 MG/5ML PO ELIX: 12.5000 mg | ORAL_SOLUTION | ORAL | Status: DC | PRN

## 2015-04-01 MED ORDER — MORPHINE SULFATE (PF) 2 MG/ML IV SOLN
INTRAVENOUS | Status: AC
  Filled 2015-04-01: qty 1

## 2015-04-01 SURGICAL SUPPLY — 59 items
BAG SPEC THK2 15X12 ZIP CLS (MISCELLANEOUS)
BAG ZIPLOCK 12X15 (MISCELLANEOUS) IMPLANT
BANDAGE ELASTIC 4 VELCRO ST LF (GAUZE/BANDAGES/DRESSINGS) ×3 IMPLANT
BANDAGE ELASTIC 6 VELCRO ST LF (GAUZE/BANDAGES/DRESSINGS) ×3 IMPLANT
BLADE SAG 18X100X1.27 (BLADE) ×3 IMPLANT
BLADE SAW SGTL 13.0X1.19X90.0M (BLADE) ×3 IMPLANT
CAP KNEE TOTAL 3 SIGMA ×2 IMPLANT
CEMENT HV SMART SET (Cement) ×6 IMPLANT
CLOSURE WOUND 1/2 X4 (GAUZE/BANDAGES/DRESSINGS) ×1
CLOTH 2% CHLOROHEXIDINE 3PK (PERSONAL CARE ITEMS) ×3 IMPLANT
CUFF TOURN SGL QUICK 34 (TOURNIQUET CUFF) ×3
CUFF TRNQT CYL 34X4X40X1 (TOURNIQUET CUFF) ×1 IMPLANT
DECANTER SPIKE VIAL GLASS SM (MISCELLANEOUS) ×3 IMPLANT
DRAPE INCISE IOBAN 66X45 STRL (DRAPES) IMPLANT
DRAPE LG THREE QUARTER DISP (DRAPES) ×3 IMPLANT
DRAPE ORTHO SPLIT 77X108 STRL (DRAPES) ×6
DRAPE SURG ORHT 6 SPLT 77X108 (DRAPES) ×2 IMPLANT
DRAPE U-SHAPE 47X51 STRL (DRAPES) ×3 IMPLANT
DRSG AQUACEL AG ADV 3.5X10 (GAUZE/BANDAGES/DRESSINGS) ×2 IMPLANT
DRSG TEGADERM 4X4.75 (GAUZE/BANDAGES/DRESSINGS) IMPLANT
DURAPREP 26ML APPLICATOR (WOUND CARE) ×3 IMPLANT
ELECT REM PT RETURN 9FT ADLT (ELECTROSURGICAL) ×3
ELECTRODE REM PT RTRN 9FT ADLT (ELECTROSURGICAL) ×1 IMPLANT
EVACUATOR 1/8 PVC DRAIN (DRAIN) ×2 IMPLANT
GAUZE SPONGE 2X2 8PLY STRL LF (GAUZE/BANDAGES/DRESSINGS) IMPLANT
GLOVE BIOGEL PI IND STRL 7.0 (GLOVE) ×1 IMPLANT
GLOVE BIOGEL PI IND STRL 8 (GLOVE) ×1 IMPLANT
GLOVE BIOGEL PI INDICATOR 7.0 (GLOVE) ×2
GLOVE BIOGEL PI INDICATOR 8 (GLOVE) ×2
GLOVE SURG SS PI 7.0 STRL IVOR (GLOVE) ×3 IMPLANT
GLOVE SURG SS PI 7.5 STRL IVOR (GLOVE) ×1 IMPLANT
GLOVE SURG SS PI 8.0 STRL IVOR (GLOVE) ×6 IMPLANT
GOWN STRL REUS W/TWL XL LVL3 (GOWN DISPOSABLE) ×6 IMPLANT
HANDPIECE INTERPULSE COAX TIP (DISPOSABLE) ×3
IMMOBILIZER KNEE 20 (SOFTGOODS) ×3
IMMOBILIZER KNEE 20 THIGH 36 (SOFTGOODS) ×1 IMPLANT
MANIFOLD NEPTUNE II (INSTRUMENTS) ×3 IMPLANT
NS IRRIG 1000ML POUR BTL (IV SOLUTION) IMPLANT
PACK TOTAL KNEE CUSTOM (KITS) ×3 IMPLANT
POSITIONER SURGICAL ARM (MISCELLANEOUS) ×3 IMPLANT
SET HNDPC FAN SPRY TIP SCT (DISPOSABLE) ×1 IMPLANT
SPONGE GAUZE 2X2 STER 10/PKG (GAUZE/BANDAGES/DRESSINGS)
SPONGE SURGIFOAM ABS GEL 100 (HEMOSTASIS) ×2 IMPLANT
STAPLER VISISTAT (STAPLE) IMPLANT
STRIP CLOSURE SKIN 1/2X4 (GAUZE/BANDAGES/DRESSINGS) ×1 IMPLANT
SUT BONE WAX W31G (SUTURE) IMPLANT
SUT MNCRL AB 4-0 PS2 18 (SUTURE) IMPLANT
SUT VIC AB 1 CT1 27 (SUTURE) ×9
SUT VIC AB 1 CT1 27XBRD ANTBC (SUTURE) ×2 IMPLANT
SUT VIC AB 2-0 CT1 27 (SUTURE) ×9
SUT VIC AB 2-0 CT1 TAPERPNT 27 (SUTURE) ×3 IMPLANT
SUT VLOC 180 0 24IN GS25 (SUTURE) ×3 IMPLANT
SYR 50ML LL SCALE MARK (SYRINGE) ×3 IMPLANT
TOWER CARTRIDGE SMART MIX (DISPOSABLE) ×3 IMPLANT
TRAY FOLEY W/METER SILVER 14FR (SET/KITS/TRAYS/PACK) ×3 IMPLANT
TRAY FOLEY W/METER SILVER 16FR (SET/KITS/TRAYS/PACK) ×1 IMPLANT
WATER STERILE IRR 1500ML POUR (IV SOLUTION) ×3 IMPLANT
WRAP KNEE MAXI GEL POST OP (GAUZE/BANDAGES/DRESSINGS) ×3 IMPLANT
YANKAUER SUCT BULB TIP 10FT TU (MISCELLANEOUS) ×3 IMPLANT

## 2015-04-01 NOTE — Op Note (Signed)
NAME:  Deanna Burke, Deanna Burke NO.:  MEDICAL RECORD NO.:  [id number]  LOCATION:                                 FACILITY:  PHYSICIAN:  Susa Day, M.D.    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  PROCEDURE PERFORMED:  Left total knee arthroplasty utilizing DePuy rotating platform, it is 3 femur 3 tibia, 10 mm insert, 35 patella.  ANESTHESIA:  Spinal.  BRIEF HISTORY:  This is a pleasant 64 year old female with bone-on-bone arthrosis of medial and lateral compartments and patellofemoral joint due to a severe osteoarthrosis of the knee, this was refractory to conservative treatment, bone-on-bone arthrosis with significant negative effect to activities of daily living, was indicated for a total knee replacement.  Risks and benefits were discussed including bleeding, infection, damage to neurovascular structures, DVT, PE, anesthetic complications, etc.  TECHNIQUE:  With the patient in supine position, after induction of adequate anesthesia and 1 g of vancomycin, left lower extremity was prepped and draped and exsanguinated in usual sterile fashion.  Thigh tourniquet inflated to 275 mmHg.  Midline incision was then made over the knee.  Full-thickness flaps developed.  Median parapatellar arthrotomy performed.  Patella everted, knee flexed.  Severe tricompartmental osteoarthrosis was noted in all compartments bone-on- bone.  Remnants of medial lateral menisci and ACL were removed as well as osteophytes in the femur.  Step drill was utilized to enter the femoral canal.  It was irrigated.  We chose a 5 degree left.  She  did have some internal rotation of the femur.  This was then pinned for a 10 off the distal femur.  Our distal femoral cut was then performed.  We then sized the distal femur off the anterior cortex to a 3.5.  We pinned it, performed anterior cut, it seemed to be with her internal rotation that we needed to perform slightly increased external rotation to be along the transepicondylar access.  We did revise that cut.  No notching of the femur.  Adequate resection posteriorly.  The chamfer cuts were performed as well.  No notching of the femur.  Attention was turned towards the tibia was subluxed.  We measured it just to a 3.  Used the external alignment guide, 3 degrees at its proximal setting for the external alignment guide, and distally the setting where it was neutral which was just at the bottom of the designated area.  We then pinned this, this was performed after we selected 2 off the defect, which was medially, was parallel to the tibial shaft and bisecting tibiotalar joint with 3 degrees of slope, performed this cut anterior and posterior.  The flexion extension gaps were equivalent.  Slight tightness in extension we felt was due to the residual PCL.  After this, we flexed the knee, protected the tissue at all times.  Measured a 3, pinned it, 1st a 4, then  a 3, pinned it, drilled it centrally.  We maximized our coverage just to the medial aspect of the tibial tubercle. Then used our punch guide.  We then turned our attention towards completing the femur.  Box cut was placed just coplanar with the lateral aspect of the femur.  It was pinned and our box cut was then performed, used our trial femur and tibia with a 10 mm insert.  It was slightly tight.  We then checked posteriorly some remnants of the PCL retained at the femur, this was removed.  We then slightly released the capsule posteriorly off the tibia and a loose body was retrieved as well. Geniculate was cauterized.  No  residual menisci.  Popliteus intact. Then placed our trials back, 10 insert, we had full extension and full flexion.  Good stability, varus and valgus stressing 0-30 degrees.  We then measured the patella at 22-23, 0.75 from that to a 15.5 utilizing the patellar jig.  We then drilled PEG holes medializing them after sizing it to a 35, we placed a trial patellar component.  We had excellent patellofemoral tracking.  All instrumentation was removed. Copiously irrigated the wound with pulsatile lavage.  Flexed the knee, dried all surfaces thoroughly, mixed cement on back table in appropriate fashion, injected into the tibia under pressure digitally pressurizing it, impacted in a 3 tibial tray, redundant cement removed, impacted in a 3 and cemented in our 3 femur with a 10 mm insert.  We held it in extension with an axial load throughout the curing the cement, redundant cement removed.  We cemented the patella as well.  While the cement was curing, we placed (TXA) tranexamic acid into the wound.  After appropriate curing of the cement, we flexed the knee, removed redundant cement with an osteotome, evacuated the TXA, removed our trial.  We meticulously removed all redundant cement with an osteotome.  Copiously irrigated with pulsatile lavage and antibiotic irrigation, placed a 10 mm permanent insert, reduced it.  We had full extension, full flexion, good stability, varus and valgus stressing 0-30 degrees.  Excellent patellofemoral tracking.  Negative anterior drawer.  A 0.25% Marcaine with epinephrine was infiltrated in the periarticular tissues.  Bone wax placed on the exposed cancellous surfaces.  Again copiously irrigated with antibiotic irrigation.  Tourniquet was deflated at 83 minutes. Cautery was utilized to cauterize any bleeders, minimal bleeding.  We therefore selected not to place in a drain.  Flexed the knee slightly, reapproximated the patellar ligament with #1 Vicryl interrupted  figure- of-eight sutures in a running V-Loc.  After excellent closure and following this, we had full extension and flexion, excellent patellofemoral tracking.  Negative anterior drawer and good stability varus valgus stressing 0-30 degrees.  Copiously irrigated the wound with antibiotic irrigation, subcu with 2-0, and skin with subcuticular Monocryl.  Sterile dressing was applied.  She was transported to the recovery room in satisfactory condition.  The patient tolerated the procedure well.  No complications.  No blood loss.  Assistant, Cleophas Dunker, [place].     Susa Day, M.D.     Geralynn Rile  D:  04/01/2015  T:  04/01/2015  Job:  [id number]

## 2015-04-01 NOTE — NC FL2 (Signed)
[place] MEDICAID FL2 LEVEL OF CARE SCREENING TOOL     IDENTIFICATION  Patient Name: Deanna Burke Birthdate: 08-27-1950 Sex: female Admission Date (Current Location): 04/01/2015  [facility] and [place] Number:     Facility and Address:  [facility],  [place], [place]      Provider Number: [id number]  Attending Physician Name and Address:  Susa Day, MD  Relative Name and Phone Number:       Current Level of Care: Hospital Recommended Level of Care: [place] Prior Approval Number:    Date Approved/Denied:   PASRR Number: [id number]  Discharge Plan: SNF    Current Diagnoses: Patient Active Problem List   Diagnosis Date Noted  . Left knee DJD 04/01/2015  . History of pulmonary embolism   . SBO (small bowel obstruction) ([place]) 12/30/2013  . Breast cancer of upper-outer quadrant of left female breast ([place]) 04/07/2013  . Neoplasm of left breast, primary tumor staging category Tis: ductal carcinoma in situ (DCIS) 01/27/2013  . Pyelonephritis, acute 06/05/2012  . Hydronephrosis of right kidney 06/05/2012  . Hyponatremia 06/05/2012  . Hypokalemia 06/05/2012  . Acute kidney injury ([place]) 06/05/2012  . 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 Abnormal finding on cardiovascular stress test, ischemia anterolateral 02/13/2012  . Syncope, ? anginal equivilant 02/13/2012  . Ileostomy in place, secondary to ulcerative colitis x 20 years 02/13/2012  . History of recurrent UTIs 02/13/2012  . Hydroureter on right 08/03/2011    Orientation ACTIVITIES/SOCIAL BLADDER RESPIRATION    Self, Time, Situation, Place  Passive Continent Other (Comment) (CPAP uses at night)  BEHAVIORAL SYMPTOMS/MOOD NEUROLOGICAL BOWEL NUTRITION STATUS  Other (Comment) (No Behaviors)        PHYSICIAN VISITS COMMUNICATION OF NEEDS Height & Weight Skin    Verbally 5' 4"  (162.6 cm) 162 lbs. Surgical wounds           AMBULATORY STATUS RESPIRATION    Assist extensive Other (Comment) (CPAP uses at night)      Personal Care Assistance Level of Assistance  Bathing, Feeding, Dressing Bathing Assistance: Limited assistance Feeding assistance: Independent Dressing Assistance: Limited assistance      Functional Limitations Info  Sight, Hearing, Speech Sight Info: Adequate Hearing Info: Adequate Speech Info: Adequate       SPECIAL CARE FACTORS FREQUENCY  PT (By licensed PT), OT (By licensed OT)     PT Frequency: 6-7 x wk OT Frequency: 6-7 x wk           Additional Factors Info  Code Status, Allergies Code Status Info: Full             Current Medications (04/01/2015):  This is the current hospital active medication list Current Facility-Administered Medications  Medication Dose Route Frequency Provider Last Rate Last Dose  . acetaminophen (TYLENOL) suppository 650 mg  650 mg Rectal Q6H PRN Susa Day, MD      . acetaminophen (TYLENOL) tablet 1,000 mg  1,000 mg Oral Q6H PRN Susa Day, MD      . acetaminophen (TYLENOL) tablet 1,000 mg  1,000 mg Oral 4 times per day Susa Day, MD   1,000 mg at 04/01/15 1250  . [START ON 04/02/2015] acidophilus (RISAQUAD) capsule 1 capsule  1 capsule Oral Daily Susa Day, MD      . alum & mag hydroxide-simeth (MAALOX/MYLANTA) 200-200-20 MG/5ML suspension 30 mL  30 mL Oral Q4H PRN Susa Day, MD      .  bisacodyl (DULCOLAX) EC tablet 5 mg  5 mg Oral Daily PRN Susa Day, MD      . Bismuth Subgallate CHEW 400 mg  400 mg Oral QID Susa Day, MD      . dextrose 5 % and 0.45 % NaCl with KCl 20 mEq/L 20-5-0.45 MEQ/L-%-% infusion           . dextrose 5 % and 0.45 % NaCl with KCl 20 mEq/L infusion   Intravenous Continuous Susa Day, MD 50 mL/hr at 04/01/15 1115    . diphenhydrAMINE (BENADRYL) 12.5 MG/5ML elixir 12.5-25 mg  12.5-25 mg Oral Q4H PRN Susa Day, MD      . docusate sodium (COLACE) capsule 100 mg  100 mg Oral BID  Susa Day, MD      . Estradiol vaginal tablet 10 mcg  1 tablet Vaginal Once per day on Mon Thu Susa Day, MD      . hydrOXYzine (ATARAX/VISTARIL) tablet 25 mg  25 mg Oral QHS Susa Day, MD      . magnesium citrate solution 1 Bottle  1 Bottle Oral Once PRN Susa Day, MD      . menthol-cetylpyridinium (CEPACOL) lozenge 3 mg  1 lozenge Oral PRN Susa Day, MD       Or  . phenol (CHLORASEPTIC) mouth spray 1 spray  1 spray Mouth/Throat PRN Susa Day, MD      . methocarbamol (ROBAXIN) tablet 500 mg  500 mg Oral Q6H PRN Susa Day, MD       Or  . methocarbamol (ROBAXIN) 500 mg in dextrose 5 % 50 mL IVPB  500 mg Intravenous Q6H PRN Susa Day, MD   500 mg at 04/01/15 1251  . metoCLOPramide (REGLAN) tablet 5-10 mg  5-10 mg Oral Q8H PRN Susa Day, MD       Or  . metoCLOPramide (REGLAN) injection 5-10 mg  5-10 mg Intravenous Q8H PRN Susa Day, MD      . multivitamin with minerals tablet 1 tablet  1 tablet Oral Daily Susa Day, MD   1 tablet at 04/01/15 1304  . naloxegol oxalate (MOVANTIK) tablet 25 mg  25 mg Oral Daily Susa Day, MD      . ondansetron [facility]) tablet 4 mg  4 mg Oral Q6H PRN Susa Day, MD       Or  . ondansetron [facility]) injection 4 mg  4 mg Intravenous Q6H PRN Susa Day, MD      . oxybutynin (DITROPAN) tablet 5 mg  5 mg Oral Q8H PRN Susa Day, MD      . oxyCODONE (Oxy IR/ROXICODONE) immediate release tablet 5-10 mg  5-10 mg Oral Q3H PRN Susa Day, MD   10 mg at 04/01/15 1250  . potassium chloride SA (K-DUR,KLOR-CON) CR tablet 20 mEq  20 mEq Oral Daily Susa Day, MD      . Derrill Memo ON 04/02/2015] rivaroxaban (XARELTO) tablet 10 mg  10 mg Oral Q breakfast Susa Day, MD      . senna-docusate (Senokot-S) tablet 1 tablet  1 tablet Oral QHS PRN Susa Day, MD      . Derrill Memo ON 04/02/2015] tamoxifen (NOLVADEX) tablet 20 mg  20 mg Oral Daily Susa Day, MD      . tranexamic acid (CYKLOKAPRON) 2,000 mg in sodium chloride 0.9 %  50 mL Topical Application  8,676 mg Topical Once Glory Buff, CRNA      . vancomycin (VANCOCIN) IVPB 1000 mg/200 mL premix  1,000 mg Intravenous Q12H Susa Day, MD  Discharge Medications: Please see discharge summary for a list of discharge medications.  Relevant Imaging Results:  Relevant Lab Results:  Recent Labs    Additional Information SS # [id number]  Damarco Keysor, Randall An, LCSW

## 2015-04-01 NOTE — Clinical Social Work Note (Signed)
Clinical Social Work Assessment  Patient Details  Name: Deanna Burke MRN: [id number] Date of Birth: 12/20/1950  Date of referral:  04/01/15               Reason for consult:  Facility Placement, Discharge Planning                Permission sought to share information with:  Facility Contact Representative Permission granted to share information::  Yes, Verbal Permission Granted  Name::        Agency::     Relationship::     Contact Information:     Housing/Transportation Living arrangements for the past 2 months:  Single Family Home Source of Information:  Patient Patient Interpreter Needed:  None Criminal Activity/Legal Involvement Pertinent to Current Situation/Hospitalization:  No - Comment as needed Significant Relationships:  Parents, Siblings Lives with:  Self Do you feel safe going back to the place where you live?   (ST Rehab needed.) Need for family participation in patient care:  No (Coment)  Care giving concerns:  Pt's care cannot be managed at home following hospital d/c.   Social Worker assessment / plan:  Pt hospitalized on 04/01/15 for pre planned left total knee replacement. CSW met with pt to assist with d/c planning. Pt has made prior arrangements to have ST Rehab at [facility] following hospital d/c. CSW has sent clinical info to SNF. Bed offer has been confirmed pending [employer] Medicare authorization. CSW will assist with authorization process once PT recommendations are available.  Employment status:  Retired Insurance information:  Managed Medicare PT Recommendations:  Not assessed at this time Information / Referral to community resources:  Skilled Nursing Facility  Patient/Family's Response to care:  Pt feels ST Rehab is need at d/c.  Patient/Family's Understanding of and Emotional Response to Diagnosis, Current Treatment, and Prognosis:  Pt is aware of her medical status. She is pleased surgery is completed and went well. Pt is motivated to work with  therapy and is looking forward to rehab at [facility].  Emotional Assessment Appearance:  Appears stated age Attitude/Demeanor/Rapport:  Other (cooperative) Affect (typically observed):  Calm, Happy Orientation:  Oriented to Self, Oriented to Place, Oriented to  Time, Oriented to Situation Alcohol / Substance use:  Not Applicable Psych involvement (Current and /or in the community):  No (Comment)  Discharge Needs  Concerns to be addressed:  Discharge Planning Concerns Readmission within the last 30 days:  No Current discharge risk:  None Barriers to Discharge:  No Barriers Identified   ,  [place], LCSW  [phone] 04/01/2015, 3:02 PM  

## 2015-04-01 NOTE — Brief Op Note (Signed)
04/01/2015  9:46 AM  PATIENT:  Deanna Burke  64 y.o. female  PRE-OPERATIVE DIAGNOSIS:  LEFT KNEE OA  POST-OPERATIVE DIAGNOSIS:  osteoarthritis left knee  PROCEDURE:  Procedure(s): LEFT TOTAL KNEE ARTHROPLASTY (Left)  SURGEON:  Surgeon(s) and Role:    * Susa Day, MD - Primary  PHYSICIAN ASSISTANT:   ASSISTANTS: [place]   ANESTHESIA:   general  EBL:  Total I/O In: 1000 [I.V.:1000] Out: 300 [Urine:250; Blood:50]  BLOOD ADMINISTERED:none  DRAINS: none   LOCAL MEDICATIONS USED:  MARCAINE     SPECIMEN:  No Specimen  DISPOSITION OF SPECIMEN:  N/A  COUNTS:  YES  TOURNIQUET:   Total Tourniquet Time Documented: Thigh (Left) - 85 minutes Total: Thigh (Left) - 85 minutes   DICTATION: .Other Dictation: Dictation Number [id number]  PLAN OF CARE: Admit to inpatient   PATIENT DISPOSITION:  PACU - hemodynamically stable.   Delay start of Pharmacological VTE agent (>24hrs) due to surgical blood loss or risk of bleeding: no

## 2015-04-01 NOTE — Discharge Summary (Addendum)
Physician Discharge Summary   Patient ID: Deanna Burke MRN: [id number] DOB/AGE: [birth year] 64 y.o.  Admit date: 04/01/2015 Discharge date:  04/05/15  Primary Diagnosis: left knee primary osteoarthritis  Admission Diagnoses:  Past Medical History  Diagnosis Date  . Ulcerative colitis [phone]  . Pulmonary embolus ([place]) 1971  . Phlebitis 1971  . Small bowel obstruction ([place]) [id number]  . Small bowel perforation ([place]) 1971/1996  . Large bowel perforation ([place]) 1971/1996  . Scoliosis   . Torn rotator cuff 2005  . History of bone density study 2012  . Fatigue   . Cataract     Left Eye  . Hot flashes   . 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 Hypertension   . Cancer (HCC)     DCIS L breast  . H/O ulcerative colitis   . Breast cancer ([place])   . Arthritis     hands, lumbar spine, hips, right ankle  . S/P ileostomy ([place])   . S/P radiation therapy 07/24/11 - 09/06/11    Left Breast/ 5000 cGy in 25 Fractions with Boost of 1000 cGy in 5 Fractions  . 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 Abnormal finding on cardiovascular stress test, ischemia anterolateral 02/13/2012    follow heart cath - pt told one tiny blockage that didn't even matter  . Syncope, ? anginal equivilant 02/13/2012  . Ileostomy in place, secondary to ulcerative colitis x 20 years 02/13/2012  . History of recurrent UTIs 02/13/2012  . Sleep apnea 2009    uses cpap-setting is 1  . H/O nephrostomy 08/13/12   Discharge Diagnoses:   Active Problems:   Left knee DJD  Estimated body mass index is 27.79 kg/(m^2) as calculated from the following:   Height as of this encounter: 5' 4"  (1.626 m).   Weight as of this encounter: 73.483 kg (162 lb).  Procedure:  Procedure(s) (LRB): LEFT TOTAL KNEE ARTHROPLASTY (Left)   Consults: None  HPI: see H&P Laboratory Data: Admission on 04/01/2015  Component Date Value Ref Range  Status  . Potassium 04/01/2015 3.3* 3.5 - 5.1 mmol/L Final  Hospital Outpatient Visit on 03/31/2015  Component Date Value Ref Range Status  . Sodium 03/31/2015 141  135 - 145 mmol/L Final  . Potassium 03/31/2015 2.9* 3.5 - 5.1 mmol/L Final  . Chloride 03/31/2015 106  101 - 111 mmol/L Final  . CO2 03/31/2015 25  22 - 32 mmol/L Final  . Glucose, Bld 03/31/2015 105* 65 - 99 mg/dL Final  . BUN 03/31/2015 23* 6 - 20 mg/dL Final  . Creatinine, Ser 03/31/2015 1.28* 0.44 - 1.00 mg/dL Final  . Calcium 03/31/2015 9.3  8.9 - 10.3 mg/dL Final  . GFR calc non Af Amer 03/31/2015 43* >60 mL/min Final  . GFR calc Af Amer 03/31/2015 49* >60 mL/min Final   Comment: (NOTE) The eGFR has been calculated using the CKD EPI equation. This calculation has not been validated in all clinical situations. eGFR's persistently <60 mL/min signify possible Chronic Kidney Disease.   Georgiann Hahn gap 03/31/2015 10  5 - 15 Final  Hospital Outpatient Visit on 03/25/2015  Component Date Value Ref Range Status  . MRSA, PCR 03/25/2015 NEGATIVE  NEGATIVE Final  . Staphylococcus aureus 03/25/2015 NEGATIVE  NEGATIVE Final   Comment:        The Xpert SA Assay (FDA approved for NASAL specimens in patients over 21  years of age), is one component of a comprehensive surveillance program.  Test performance has been validated by [facility] for patients greater than or equal to 12 year old. It is not intended to diagnose infection nor to guide or monitor treatment.   . Sodium 03/25/2015 138  135 - 145 mmol/L Final  . Potassium 03/25/2015 3.9  3.5 - 5.1 mmol/L Final  . Chloride 03/25/2015 103  101 - 111 mmol/L Final  . CO2 03/25/2015 28  22 - 32 mmol/L Final  . Glucose, Bld 03/25/2015 92  65 - 99 mg/dL Final  . BUN 03/25/2015 25* 6 - 20 mg/dL Final  . Creatinine, Ser 03/25/2015 1.31* 0.44 - 1.00 mg/dL Final  . Calcium 03/25/2015 9.8  8.9 - 10.3 mg/dL Final  . GFR calc non Af Amer 03/25/2015 41* >60 mL/min Final  . GFR calc  Af Amer 03/25/2015 48* >60 mL/min Final   Comment: (NOTE) The eGFR has been calculated using the CKD EPI equation. This calculation has not been validated in all clinical situations. eGFR's persistently <60 mL/min signify possible Chronic Kidney Disease.   . Anion gap 03/25/2015 7  5 - 15 Final  . WBC 03/25/2015 5.9  4.0 - 10.5 K/uL Final  . RBC 03/25/2015 4.50  3.87 - 5.11 MIL/uL Final  . Hemoglobin 03/25/2015 13.2  12.0 - 15.0 g/dL Final  . HCT 03/25/2015 39.7  36.0 - 46.0 % Final  . MCV 03/25/2015 88.2  78.0 - 100.0 fL Final  . MCH 03/25/2015 29.3  26.0 - 34.0 pg Final  . MCHC 03/25/2015 33.2  30.0 - 36.0 g/dL Final  . RDW 03/25/2015 12.6  11.5 - 15.5 % Final  . Platelets 03/25/2015 282  150 - 400 K/uL Final  . Prothrombin Time 03/25/2015 13.8  11.6 - 15.2 seconds Final  . INR 03/25/2015 1.04  0.00 - 1.49 Final  . ABO/RH(D) 03/25/2015 O POS   Final  . Antibody Screen 03/25/2015 NEG   Final  . Sample Expiration 03/25/2015 04/04/2015   Final  . Extend sample reason 03/25/2015 NO TRANSFUSIONS OR PREGNANCY IN THE PAST 3 MONTHS   Final  . Color, Urine 03/25/2015 YELLOW  YELLOW Final  . APPearance 03/25/2015 TURBID* CLEAR Final  . Specific Gravity, Urine 03/25/2015 1.015  1.005 - 1.030 Final  . pH 03/25/2015 6.0  5.0 - 8.0 Final  . Glucose, UA 03/25/2015 NEGATIVE  NEGATIVE mg/dL Final  . Hgb urine dipstick 03/25/2015 MODERATE* NEGATIVE Final  . Bilirubin Urine 03/25/2015 NEGATIVE  NEGATIVE Final  . Ketones, ur 03/25/2015 NEGATIVE  NEGATIVE mg/dL Final  . Protein, ur 03/25/2015 NEGATIVE  NEGATIVE mg/dL Final  . Nitrite 03/25/2015 NEGATIVE  NEGATIVE Final  . Leukocytes, UA 03/25/2015 LARGE* NEGATIVE Final  . Squamous Epithelial / LPF 03/25/2015 0-5* NONE SEEN Final  . WBC, UA 03/25/2015 TOO NUMEROUS TO COUNT  0 - 5 WBC/hpf Final  . RBC / HPF 03/25/2015 6-30  0 - 5 RBC/hpf Final  . Bacteria, UA 03/25/2015 FEW* NONE SEEN Final  . Urine-Other 03/25/2015 YEAST PRESENT   Final      X-Rays:Dg Knee 1-2 Views Left  03/25/2015  CLINICAL DATA:  Preop for left knee replacement, left knee pain EXAM: LEFT KNEE - 1-2 VIEW COMPARISON:  None. FINDINGS: There is age advanced tricompartmental degenerative joint disease of the left knee primarily involving the medial and patellofemoral compartments where there is more loss of joint space and sclerosis with spurring. No acute fracture is seen. A tiny amount of  joint fluid cannot be excluded. There does appear to be faint chondrocalcinosis present and CPPD is a consideration. IMPRESSION: 1. 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 2. 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 Electronically Signed   By: Ivar Drape M.D.   On: 03/25/2015 16:19   Ir Ivc Filter Plmt / S&i /img Guid/mod Sed  03/31/2015  INDICATION: Remote history of pulmonary embolism and lower extremity DVT, now scheduled to undergo knee surgery. Request made for placement of a prophylactic temporary IVC filter. History of right-sided nephrectomy and chronic renal insufficiency. EXAM: ULTRASOUND GUIDANCE FOR VASCULAR ACCESS IVC CATHETERIZATION AND VENOGRAM IVC FILTER INSERTION COMPARISON:  None. MEDICATIONS: Fentanyl 50 mcg IV; Versed 1 mg IV ANESTHESIA/SEDATION: Sedation Time 11 minutes CONTRAST:  None, CO2 was utilized for this examination. FLUOROSCOPY TIME:  1 minute 36 seconds (214 mGy). COMPLICATIONS: None immediate PROCEDURE: Informed consent was obtained from the patient following explanation of the procedure, risks, benefits and alternatives. The patient understands, agrees and consents for the procedure. All questions were addressed. A time out was performed prior to the initiation of the procedure. Maximal barrier sterile technique utilized including caps, mask, sterile gowns, sterile gloves, large sterile drape, hand hygiene, and Betadine prep. Under sterile condition and local anesthesia, right internal jugular venous access was  performed with ultrasound. An ultrasound image was saved and sent to PACS. Over a guidewire, the IVC filter delivery sheath and inner dilator were advanced into the IVC just above the IVC bifurcation. Given patient's history of renal insufficiency and solitary kidney, a CO2 injection was performed for an IVC venogram. Through the delivery sheath, a retrievable Denali IVC filter was deployed below the level of the renal veins and above the IVC bifurcation. Limited post deployment CO2 venacavagram was performed. The delivery sheath was removed and hemostasis was obtained with manual compression. A dressing was placed. The patient tolerated the procedure well without immediate post procedural complication. FINDINGS: The IVC is patent. No evidence of thrombus, stenosis, or occlusion. No variant venous anatomy. Successful placement of the IVC filter below the level of the renal veins. IMPRESSION: Successful ultrasound and fluoroscopically guided placement of an infrarenal retrievable IVC filter via right jugular approach. This IVC filter is potentially retrievable. The patient will be assessed for filter retrieval by Interventional Radiology in approximately 8-12 weeks. Further recommendations regarding filter retrieval, continued surveillance or declaration of device permanence, will be made at that time. Electronically Signed   By: Sandi Mariscal M.D.   On: 03/31/2015 12:19   Dg Knee Left Port  04/01/2015  CLINICAL DATA:  64 year old female post knee replacement. Osteoarthritis. Subsequent encounter. EXAM: PORTABLE LEFT KNEE - 1-2 VIEW COMPARISON:  03/25/2015. FINDINGS: Post left total knee replacement without complication noted. Prominent osteophyte/loose body posterior aspect of the knee once again noted. Overlying splint slightly limits evaluation. IMPRESSION: Post total left knee replacement appearing in satisfactory position as noted above. Electronically Signed   By: [name] M.D.   On: 04/01/2015 11:13     EKG: Orders placed or performed during the hospital encounter of 03/25/15  . EKG 12 lead  . EKG 12 lead     Hospital Course: Deanna Burke is a 64 y.o. who was admitted to [facility]. They were brought to the operating room on 04/01/2015 and underwent Procedure(s): LEFT TOTAL KNEE ARTHROPLASTY.  Patient tolerated the procedure well and was later transferred to the recovery room and then to the orthopaedic floor for postoperative care.  They were  given PO and IV analgesics for pain control following their surgery.  They were given 24 hours of postoperative antibiotics of  Anti-infectives    Start     Dose/Rate Route Frequency Ordered Stop   04/01/15 1800  vancomycin (VANCOCIN) IVPB 1000 mg/200 mL premix     1,000 mg 200 mL/hr over 60 Minutes Intravenous Every 12 hours 04/01/15 1132 04/02/15 0559   04/01/15 1145  fluconazole (DIFLUCAN) tablet 100 mg  Status:  Discontinued     100 mg Oral Daily 04/01/15 1132 04/01/15 1156   04/01/15 0937  polymyxin B 500,000 Units, bacitracin 50,000 Units in sodium chloride irrigation 0.9 % 500 mL irrigation  Status:  Discontinued       As needed 04/01/15 0937 04/01/15 1011   04/01/15 0630  vancomycin (VANCOCIN) IVPB 1000 mg/200 mL premix     1,000 mg 200 mL/hr over 60 Minutes Intravenous On call to O.R. 04/01/15 3888 04/01/15 0729     and started on DVT prophylaxis in the form of Xarelto, TED hose and SCDs and patient had a temporary IVC Filter placed the day before surgery. IR will remove the temporary filter accordingly.  PT and OT were ordered for total joint protocol.  Discharge planning consulted to help with postop disposition and equipment needs. They started to get up OOB with therapy on day one.  She continued to work with therapy into day two. By day three, patient had progressed with therapy and was meeting their goals. She reported UTI symptoms and UA and urine culture sent, started on Bactrim with improvement of symptoms. Patient  was seen in rounds daily, ready for D/C to [facility] on POD #4.  Diet: Regular diet Activity:WBAT Follow-up:in 10-14 days Disposition - [facility] Discharged Condition: good     Medication List    TAKE these medications        docusate sodium 100 MG capsule  Commonly known as:  COLACE  Take 1 capsule (100 mg total) by mouth 2 (two) times daily as needed for mild constipation.     oxyCODONE-acetaminophen 5-325 MG tablet  Commonly known as:  PERCOCET  Take 1-2 tablets by mouth every 4 (four) hours as needed for severe pain.     rivaroxaban 10 MG Tabs tablet  Commonly known as:  XARELTO  Take 1 tablet (10 mg total) by mouth daily.      ASK your doctor about these medications        acetaminophen 500 MG tablet  Commonly known as:  TYLENOL  Take 1,000 mg by mouth every 6 (six) hours as needed for moderate pain.     aspirin 81 MG tablet  Take 81 mg by mouth daily.     ciprofloxacin 250 MG tablet  Commonly known as:  CIPRO  Take 250 mg by mouth 2 (two) times daily.     DEVROM 200 MG Chew  Generic drug:  Bismuth Subgallate  Chew 400 mg by mouth 4 (four) times daily. For odor control - pt has ileostomy     diclofenac sodium 1 % Gel  Commonly known as:  VOLTAREN  Apply 2 g topically 4 (four) times daily as needed (for pain). For shoulder/ joint pain     Estradiol 10 MCG Tabs vaginal tablet  Commonly known as:  VAGIFEM  Place 1 tablet (10 mcg total) vaginally 2 (two) times a week.     fluconazole 100 MG tablet  Commonly known as:  DIFLUCAN  Take 1 tablet (100 mg total)  by mouth daily.     hydrOXYzine 25 MG tablet  Commonly known as:  ATARAX/VISTARIL  Take 25 mg by mouth at bedtime.     multivitamin with minerals tablet  Take 1 tablet by mouth daily.     oxybutynin 5 MG tablet  Commonly known as:  DITROPAN  Take 5 mg by mouth every 8 (eight) hours as needed for bladder spasms.     potassium chloride SA 20 MEQ tablet  Commonly known as:  K-DUR,KLOR-CON   Take 1 tablet (20 mEq total) by mouth daily.     tamoxifen 20 MG tablet  Commonly known as:  NOLVADEX  Take 1 tablet (20 mg total) by mouth daily.     triamterene-hydrochlorothiazide 37.5-25 MG tablet  Commonly known as:  MAXZIDE-25  Take 1 tablet by mouth every morning.             Follow-up Information    Follow up with BEANE,JEFFREY C, MD In 2 weeks.   Specialty:  Orthopedic Surgery   Why:  For suture removal   Contact information:   [address] [place] [address] [phone]      Addendum: Will D/C with Bacrim while urine culture pending as she had continued symptoms despite treatment with Cipro pre-operatively  Signed: Lacie Draft, PA-C for Dr. Tonita Cong Orthopaedic Surgery 04/05/15

## 2015-04-01 NOTE — Interval H&P Note (Signed)
History and Physical Interval Note:  04/01/2015 7:33 AM  Deanna Burke  has presented today for surgery, with the diagnosis of LEFT KNEE OA  The various methods of treatment have been discussed with the patient and family. After consideration of risks, benefits and other options for treatment, the patient has consented to  Procedure(s): LEFT TOTAL KNEE ARTHROPLASTY (Left) as a surgical intervention .  The patient's history has been reviewed, patient examined, no change in status, stable for surgery.  I have reviewed the patient's chart and labs.  Questions were answered to the patient's satisfaction.     Duvall Comes C

## 2015-04-01 NOTE — H&P (View-Only) (Signed)
Deanna Burke DOB: 11/22/1950 Single / Language: Deanna Burke / Race: White Female  H&P Date: 03/25/15  Chief Complaint: L knee pain   History of Present Illness The patient is a 64 year old female who comes in today for a preoperative History and Physical. The patient is scheduled for a left total knee arthroplasty to be performed by Dr. Johnn Hai, MD at [facility] on April 01, 2015. Elna reports years of pain in bilateral knees, left worse than right currently, refractory to injections, bracing, medications, quad strengthening, activity modifications. Pain and stiffness are interfering with ADLs and quality of life at this point and she desires to proceed with surgery.  Dr. Tonita Cong and the patient mutually agreed to proceed with a total knee replacement. Risks and benefits of the procedure were discussed including stiffness, suboptimal range of motion, persistent pain, infection requiring removal of prosthesis and reinsertion, need for prophylactic antibiotics in the future, for example, dental procedures, possible need for manipulation, revision in the future and also anesthetic complications including DVT, PE, etc. We discussed the perioperative course, time in the hospital, postoperative recovery and the need for elevation to control swelling. We also discussed the predicted range of motion and the probability that squatting and kneeling would be unobtainable in the future. In addition, postoperative anticoagulation was discussed. We have obtained preoperative medical clearance as necessary (Dr. Jana Hakim and Dr. Holley Raring). Provided her illustrated handout and discussed it in detail. They will enroll in the total joint replacement educational forum at the hospital.  Allergies DEMEROL 01/28/1997 DILAUDID 01/28/1997 STADOL 01/28/1997 KEFLEX 01/28/1997 PENICILLIN 01/28/1997 Reglan *GASTROINTESTINAL AGENTS - MISC.*   Family History Heart disease in female family member  before age 57  Kidney disease  grandmother mothers side First Degree Relatives  Heart disease in female family member before age 64  Cancer  grandmother mothers side and grandmother fathers side Drug / Alcohol Addiction  grandfather mothers side Heart Disease  mother, father, brother and grandfather fathers side  Social History Tobacco use  never smoker Alcohol use  never consumed alcohol Marital status  divorced Drug/Alcohol Rehab (Currently)  no Exercise  Exercises weekly; does running / walking Children  0 Number of flights of stairs before winded  2-3 Living situation  live alone, one level home. 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 Aspirin (81MG Tablet, 1 (one) Oral) Active. Vagifem (10MCG Tablet, Vaginal) Active. Potassium Chloride Crys ER (20MEQ Tablet ER, Oral) Active. Tamoxifen Citrate (20MG Tablet, Oral) Active. Medications Reconciled HydrOXYzine HCl (25MG Tablet, Oral qhs) Active. Bismuth Subgallate (200MG Tablet Chewable, Oral 8 daily) Active. Multivitamin Adult (Oral) Specific strength unknown - Active. Diflucan (100MG Tablet, Oral prn) Active. Oxybutynin Chloride (Oral) Specific strength unknown - Active. Acetaminophen (500MG Tablet, Oral as needed) Active. Diclofenac Sodium (1% Gel, Transdermal as needed) Active.  Pregnancy / Birth History Pregnant  no  Past Surgical History Oophorectomy  bilateral Rotator Cuff Repair  right Breast Mass; Local Excision  left Cataract Surgery  left Colectomy  complete Appendectomy  Breast Biopsy  left Kidney Removal - Right  Hysterectomy  partial  (non-cancerous) Exploratory Laparotomy   Past Medical History Ulcerative Colitis  Anemia  Blood Clot  Breast Cancer  Chronic Cystitis  High blood pressure  Chronic Pain  Osteoarthritis  Sleep Apnea  Kidney Stone  Cataract  Phlebitis  Pulmonary Embolism  Urinary Tract Infection  Kidney Disease  Mumps  Measles   Review of Systems General Present- Weight Loss. Not Present- Chills, Fatigue, Fever, Memory Loss, Night Sweats and Weight Gain. Skin Not Present- Eczema, Hives, Itching, Lesions and Rash. HEENT Present- Hearing Loss. Not Present- Dentures, Double Vision, Headache, Tinnitus and Visual Loss. Respiratory Not Present- Allergies, Chronic Cough, Coughing up blood, Shortness of breath at rest and Shortness of breath with exertion. Cardiovascular Not Present- Chest Pain, Difficulty Breathing Lying Down, Murmur, Palpitations, Racing/skipping heartbeats and Swelling. Gastrointestinal Not Present- Abdominal Pain, Bloody Stool, Constipation, Diarrhea, Difficulty Swallowing, Heartburn, Jaundice, Loss of appetitie, Nausea and Vomiting. Female Genitourinary Not Present- Blood in Urine, Discharge, Flank Pain, Incontinence, Painful Urination, Urgency, Urinary frequency, Urinary Retention, Urinating at Night and Weak urinary stream. Musculoskeletal Present- Back Pain, Joint Pain, Joint Swelling and Morning Stiffness. Not Present- Muscle Pain, Muscle Weakness and Spasms. Neurological Not Present- Blackout spells, Difficulty with balance, Dizziness, Paralysis, Tremor and Weakness. Psychiatric Not Present- Insomnia.  Physical Exam General Mental Status -Alert, cooperative and good historian. General Appearance-pleasant, Not in acute distress. Orientation-Oriented X3. Build & Nutrition-Well nourished and Well developed.  Head and Neck Head-normocephalic, atraumatic . Neck Global Assessment - supple, no bruit auscultated on the right, no bruit auscultated on the  left.  Eye Pupil - Bilateral-Regular and Round. Motion - Bilateral-EOMI.  Chest and Lung Exam Auscultation Breath sounds - clear at anterior chest wall and clear at posterior chest wall. Adventitious sounds - No Adventitious sounds.  Cardiovascular Auscultation Rhythm - Regular rate and rhythm. Heart Sounds - S1 WNL and S2 WNL. Murmurs & Other Heart Sounds - Auscultation of the heart reveals - No Murmurs.  Abdomen Palpation/Percussion Tenderness - Abdomen is non-tender to palpation. Rigidity (guarding) - Abdomen is soft. Auscultation Auscultation of the abdomen reveals - Bowel sounds normal.  Female Genitourinary Not done, not pertinent to present illness  Musculoskeletal Note: Exquisite tender in the medial joint line, walks with an antalgic gait, moderate distress. Lacks 5 degrees of extension. Flexes to 90. Patellofemoral pain with compression.  Imaging AP standing and lateral demonstrates bone-on-bone arthrosis. She does have slight valgus.  Assessment & Plan Primary osteoarthritis of left knee (M17.12)  Pt with end-stage L knee DJD, bone-on-bone, refractory to conservative tx, scheduled for L total knee replacement by Dr. Tonita Cong on 04/01/15. We again discussed the procedure itself as well as risks, complications and alternatives, including but not limited to DVT, PE, infx, bleeding, failure of procedure, need for secondary procedure including manipulation, nerve injury, ongoing pain/symptoms, anesthesia risk, even stroke or death. Also discussed typical post-op protocols, activity restrictions, need for PT, flexion/extension exercises, time out of work. Discussed need for DVT ppx post-op with Xarelto then ASA per protocol. Discussed dental ppx. Also discussed limitations post-operatively such as kneeling and squatting. All questions were answered. Patient desires to proceed with surgery as scheduled. Will hold ASA, supplements and NSAIDs accordingly. Will remain NPO after MN  night before surgery. Will present to [facility] for pre-op testing and we have arranged for a temporary IVC filter to be placed as an outpatient the day before surgery 12/7 (spoke with Tiffany in IR today). Given her multiple allergies will use Vanco peri-operatively. Plan Xarelto for at least 3 weeks post-op for DVT ppx given her DVT/PE hx then ASA. Plan Percocet, Movantik, Colace. Plan initial D/C to [facility] then home with HHPT and eventual outpatient PT which she plans to do here at [facility]. Will follow up 10-14 days post-op for suture removal and xrays. She will call with any questions or concerns in the interim.  Plan Left total knee replacement  Signed electronically by Cecilie Kicks, PA-C for Dr. Tonita Cong

## 2015-04-01 NOTE — Anesthesia Procedure Notes (Signed)
Spinal Patient location during procedure: OR Start time: 04/01/2015 7:42 AM End time: 04/01/2015 7:46 AM Staffing Resident/CRNA: Chrystine Oiler G Performed by: anesthesiologist and resident/CRNA  Preanesthetic Checklist Completed: patient identified, site marked, surgical consent, pre-op evaluation, timeout performed, IV checked, risks and benefits discussed and monitors and equipment checked Spinal Block Patient position: sitting Prep: Betadine Patient monitoring: heart rate, continuous pulse ox and blood pressure Approach: midline Location: L2-3 Injection technique: single-shot Needle Needle type: Sprotte  Needle gauge: 24 G Needle length: 10 cm Needle insertion depth: 5 cm Assessment Sensory level: T6 Additional Notes Expiration date of kit checked and confirmed. Patient tolerated procedure well, without complications.

## 2015-04-01 NOTE — Anesthesia Postprocedure Evaluation (Signed)
Anesthesia Post Note  Patient: Deanna Burke  Procedure(s) Performed: Procedure(s) (LRB): LEFT TOTAL KNEE ARTHROPLASTY (Left)  Patient location during evaluation: PACU Anesthesia Type: Spinal Level of consciousness: oriented and awake and alert Pain management: pain level controlled Vital Signs Assessment: post-procedure vital signs reviewed and stable Respiratory status: spontaneous breathing, respiratory function stable and patient connected to nasal cannula oxygen Cardiovascular status: blood pressure returned to baseline and stable Postop Assessment: no headache and no backache Anesthetic complications: no    Last Vitals:  Filed Vitals:   04/01/15 1025 04/01/15 1030  BP:  101/52  Pulse: 82 84  Temp:    Resp: 13 16    Last Pain:  Filed Vitals:   04/01/15 1035  PainSc: Asleep                 Kalis Friese

## 2015-04-01 NOTE — Progress Notes (Signed)
Has been taking Cipro for UTI

## 2015-04-01 NOTE — Evaluation (Signed)
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   Clinical Impression  Pt s/p L TKA presents with decreased ROM and strength in LLE to benefit from PT to continue to eventually DC home alone .     Follow Up Recommendations SNF    Equipment Recommendations  Rolling walker with 5" wheels    Recommendations for Other Services       Precautions / Restrictions Precautions Precautions: Knee Required Braces or Orthoses: Knee Immobilizer - Right Knee Immobilizer - Right: Discontinue once straight leg raise with < 10 degree lag Restrictions Weight Bearing Restrictions: No      Mobility  Bed Mobility Overal bed mobility: Needs Assistance Bed Mobility: Supine to Sit;Sit to Supine     Supine to sit: Min assist Sit to supine: Min assist   General bed mobility comments: assist with LE and with upper body  Transfers Overall transfer level: Needs assistance Equipment used: Rolling walker (2 wheeled) Transfers: Sit to/from Stand Sit to Stand: Min assist         General transfer comment: cues for RW safety   Ambulation/Gait Ambulation/Gait assistance: Min assist Ambulation Distance (Feet): 20 Feet (to from bathroom ) Assistive device: Rolling walker (2 wheeled) Gait Pattern/deviations: Step-to pattern     General Gait Details: limited distance today for patietn walked to bathroom and squated facing toilet in order to empty her iliostomy bag so this tired her out from continuuing to walk after this event. Tolerated well.   Stairs            Wheelchair Mobility    Modified Rankin (Stroke Patients Only)       Balance Overall balance assessment: Needs assistance Sitting-balance support: Feet supported       Standing balance support: Bilateral upper extremity  supported                                 Pertinent Vitals/Pain Pain Assessment: 0-10 Pain Score: 3  Pain Location: L knee , however felt better after moving and getting out of bed.  Pain Descriptors / Indicators: Aching Pain Intervention(s): Premedicated before session;Monitored during session;Ice applied    Home Living Family/patient expects to be discharged to:: Skilled nursing facility (pt states she is planning on heading to SNF for short term prior to home alone) Living Arrangements: Alone                    Prior Function Level of Independence: Independent               Hand Dominance        Extremity/Trunk Assessment               Lower Extremity Assessment: LLE deficits/detail   LLE Deficits / Details: limited to grossly 0-40 degree flexion within pain tolerance in supine.      Communication   Communication: No difficulties  Cognition Arousal/Alertness: Awake/alert Behavior During Therapy: WFL for tasks assessed/performed Overall Cognitive Status: Within Functional Limits for tasks assessed                      General Comments      Exercises Total Joint Exercises Ankle Circles/Pumps: AROM;Left;10 reps;Supine Quad Sets: AAROM;AROM;Supine;Left;5 reps Heel Slides:  AAROM;Supine;Left;5 reps Hip ABduction/ADduction: AAROM;Supine;Left;5 reps Straight Leg Raises: AROM;Supine;Left;5 reps Goniometric ROM: 0-40      Assessment/Plan    PT Assessment Patient needs continued PT services  PT Diagnosis Difficulty walking   PT Problem List Decreased strength;Decreased range of motion;Decreased activity tolerance;Decreased mobility;Decreased knowledge of use of DME  PT Treatment Interventions Gait training;DME instruction;Functional mobility training;Therapeutic activities;Therapeutic exercise;Patient/family education   PT Goals (Current goals can be found in the Care Plan section) Acute Rehab PT Goals Patient Stated Goal: to  be able to get out and about again and home  PT Goal Formulation: With patient Time For Goal Achievement: 04/15/15    Frequency 7X/week   Barriers to discharge        Co-evaluation               End of Session Equipment Utilized During Treatment: Gait belt Activity Tolerance: Patient tolerated treatment well Patient left: in chair;with family/visitor present;with call bell/phone within reach Nurse Communication: Mobility status         Time: 1641-1710 PT Time Calculation (min) (ACUTE ONLY): 29 min   Charges:   PT Evaluation $Initial PT Evaluation Tier I: 1 Procedure PT Treatments $Gait Training: 8-22 mins   PT G CodesClide Dales 04-19-2015, 5:40 PM  Clide Dales, PT Pager: [phone] April 19, 2015

## 2015-04-01 NOTE — Clinical Social Work Placement (Signed)
   CLINICAL SOCIAL WORK PLACEMENT  NOTE  Date:  04/01/2015  Patient Details  Name: Deanna Burke MRN: [id number] Date of Birth: 02/27/1951  Clinical Social Work is seeking post-discharge placement for this patient at the [place] level of care (*CSW will initial, date and re-position this form in  chart as items are completed):  No   Patient/family provided with [facility] Work Department's list of facilities offering this level of care within the geographic area requested by the patient (or if unable, by the patient's family).  Yes   Patient/family informed of their freedom to choose among providers that offer the needed level of care, that participate in Medicare, Medicaid or managed care program needed by the patient, have an available bed and are willing to accept the patient.  No   Patient/family informed of [facility]'s ownership interest in [facility] and [facility], as well as of the fact that they are under no obligation to receive care at these facilities.  PASRR submitted to EDS on 04/01/15     PASRR number received on 04/01/15     Existing PASRR number confirmed on       FL2 transmitted to all facilities in geographic area requested by pt/family on 04/01/15     FL2 transmitted to all facilities within larger geographic area on       Patient informed that his/her managed care company has contracts with or will negotiate with certain facilities, including the following:            Patient/family informed of bed offers received.  Patient chooses bed at       Physician recommends and patient chooses bed at      Patient to be transferred to   on  .  Patient to be transferred to facility by       Patient family notified on   of transfer.  Name of family member notified:        PHYSICIAN       Additional Comment:    _______________________________________________ Luretha Rued, [place] 04/01/2015,  3:08 PM

## 2015-04-01 NOTE — Progress Notes (Signed)
Utilization review completed.  

## 2015-04-01 NOTE — Transfer of Care (Signed)
Immediate Anesthesia Transfer of Care Note  Patient: Deanna Burke  Procedure(s) Performed: Procedure(s): LEFT TOTAL KNEE ARTHROPLASTY (Left)  Patient Location: PACU  Anesthesia Type:MAC and Spinal  Level of Consciousness: awake, alert  and oriented  Airway & Oxygen Therapy: Patient Spontanous Breathing and Patient connected to face mask oxygen  Post-op Assessment: Report given to RN and Post -op Vital signs reviewed and stable  Post vital signs: Reviewed and stable  Last Vitals:  Filed Vitals:   04/01/15 0537  BP: 128/68  Pulse: 106  Temp: 36.9 C  Resp: 18    Complications: No apparent anesthesia complications

## 2015-04-02 LAB — BASIC METABOLIC PANEL
ANION GAP: 7 (ref 5–15)
BUN: 18 mg/dL (ref 6–20)
CALCIUM: 8.3 mg/dL — AB (ref 8.9–10.3)
CO2: 24 mmol/L (ref 22–32)
Chloride: 103 mmol/L (ref 101–111)
Creatinine, Ser: 1.24 mg/dL — ABNORMAL HIGH (ref 0.44–1.00)
GFR, EST AFRICAN AMERICAN: 51 mL/min — AB (ref >60)
GFR, EST NON AFRICAN AMERICAN: 44 mL/min — AB (ref >60)
GLUCOSE: 143 mg/dL — AB (ref 65–99)
POTASSIUM: 3.4 mmol/L — AB (ref 3.5–5.1)
Sodium: 134 mmol/L — ABNORMAL LOW (ref 135–145)

## 2015-04-02 LAB — CBC
HCT: 32.2 % — ABNORMAL LOW (ref 36.0–46.0)
Hemoglobin: 10.8 g/dL — ABNORMAL LOW (ref 12.0–15.0)
MCH: 29.4 pg (ref 26.0–34.0)
MCHC: 33.5 g/dL (ref 30.0–36.0)
MCV: 87.7 fL (ref 78.0–100.0)
PLATELETS: 252 10*3/uL (ref 150–400)
RBC: 3.67 MIL/uL — AB (ref 3.87–5.11)
RDW: 12.9 % (ref 11.5–15.5)
WBC: 9.6 10*3/uL (ref 4.0–10.5)

## 2015-04-02 MED ORDER — BISMUTH SUBGALLATE 200 MG PO CHEW
400.0000 mg | CHEWABLE_TABLET | Freq: Four times a day (QID) | ORAL | Status: DC
  Administered 2015-04-03 – 2015-04-05 (×10): 400 mg via ORAL

## 2015-04-02 MED ORDER — NALOXEGOL OXALATE 12.5 MG PO TABS
12.5000 mg | ORAL_TABLET | Freq: Every day | ORAL | Status: DC
  Administered 2015-04-02 – 2015-04-04 (×3): 12.5 mg via ORAL
  Filled 2015-04-02 (×4): qty 1

## 2015-04-02 NOTE — Progress Notes (Signed)
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     PT Comments    POD # 1 am session.  Applied KI and assisted OOB to amb to bathroom.  Assisted in bathroom as pt stood above toilet for several min to empty her colostomy.  Very fatigued, pt had to sit on toilet to rest afterwards.  Assisted with amb to recliner only due to max c/o fatigue.    Follow Up Recommendations  SNF     Equipment Recommendations       Recommendations for Other Services       Precautions / Restrictions Precautions Precautions: Knee Precaution Comments: instructed on KI use for amb Required Braces or Orthoses: Knee Immobilizer - Right Knee Immobilizer - Right: Discontinue once straight leg raise with < 10 degree lag Restrictions Weight Bearing Restrictions: No Other Position/Activity Restrictions: WBAT    Mobility  Bed Mobility Overal bed mobility: Needs Assistance Bed Mobility: Supine to Sit     Supine to sit: Min assist     General bed mobility comments: assist with L LE and increased time  Transfers Overall transfer level: Needs assistance Equipment used: Rolling walker (2 wheeled)   Sit to Stand: Min guard;Min assist         General transfer comment: 25% VC's on proper hand placement and turn completion.  Assisted in bethroom.  Ambulation/Gait Ambulation/Gait assistance: Min guard Ambulation Distance (Feet): 20 Feet (10 x 2 to and from bathroom only) Assistive device: Rolling walker (2 wheeled) Gait Pattern/deviations: Step-to pattern;Decreased stance time - left;Decreased step length - right;Decreased step length - left;Trunk flexed Gait velocity: decreased    General Gait Details: limited distance today for patietn walked to bathroom and squated facing toilet in order to empty her  cholostomystomy bag so this tired her out from continuuing to walk after this event.   Stairs            Wheelchair Mobility    Modified Rankin (Stroke Patients Only)       Balance                                    Cognition Arousal/Alertness: Awake/alert Behavior During Therapy: WFL for tasks assessed/performed Overall Cognitive Status: Within Functional Limits for tasks assessed                      Exercises      General Comments        Pertinent Vitals/Pain Pain Assessment: 0-10 Pain Score: 5  Pain Location: L knee and R hip Pain Descriptors / Indicators: Grimacing;Sore;Tender Pain Intervention(s): Monitored during session;Repositioned;Ice applied    Home Living Family/patient expects to be discharged to:: Skilled nursing facility (pt states she is planning on heading to SNF for short term prior to home alone) Living Arrangements: Alone                  Prior Function Level of Independence: Independent          PT Goals (current goals can now be found in the care plan section) Progress towards PT goals: Progressing toward goals    Frequency  7X/week    PT Plan Current plan remains appropriate    Co-evaluation  End of Session Equipment Utilized During Treatment: Gait belt Activity Tolerance: Patient limited by fatigue Patient left: in chair;with call bell/phone within reach;with chair alarm set     Time: 1115-1145 PT Time Calculation (min) (ACUTE ONLY): 30 min  Charges:  $Gait Training: 8-22 mins $Therapeutic Activity: 8-22 mins                    G Codes:      Rica Koyanagi  PTA [facility]  Acute  Rehab Pager      [phone]

## 2015-04-02 NOTE — Evaluation (Signed)
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    Clinical Impression   Pt is s/p TKA resulting in the deficits listed below (see OT Problem List).  Pt will benefit from skilled OT to increase their safety and independence with ADL and functional mobility for ADL to facilitate discharge to venue listed below.        Follow Up Recommendations  SNF          Precautions / Restrictions Precautions Precautions: Knee Required Braces or Orthoses: Knee Immobilizer - Right Knee Immobilizer - Right: Discontinue once straight leg raise with < 10 degree lag Restrictions Weight Bearing Restrictions: No      Mobility Bed Mobility               General bed mobility comments: pti n chair  Transfers Overall transfer level: Needs assistance                         ADL Overall ADL's : Needs assistance/impaired Eating/Feeding: Set up;Sitting   Grooming: Set up;Sitting   Upper Body Bathing: Minimal assitance;Sitting   Lower Body Bathing: Maximal assistance;Sit to/from stand   Upper Body Dressing : Set up;Sitting   Lower Body Dressing: Maximal assistance;Sit to/from stand                 General ADL Comments: pt concerned about spasm in R Hip- it is bothering her more than L knee.  Pt plans to go to SNF as she lives alone               Pertinent Vitals/Pain Pain Score: 3  Pain Location: L knee and R hip ( spasm) Pain Descriptors / Indicators: Sore Pain Intervention(s): Monitored during session;Patient requesting pain meds-RN notified        Extremity/Trunk Assessment Upper Extremity Assessment Upper Extremity Assessment: Generalized weakness           Communication Communication Communication: No difficulties   Cognition Arousal/Alertness:  Awake/alert Behavior During Therapy: WFL for tasks assessed/performed Overall Cognitive Status: Within Functional Limits for tasks assessed                                Home Living Family/patient expects to be discharged to:: Skilled nursing facility (pt states she is planning on heading to SNF for short term prior to home alone) Living Arrangements: Alone                                      Prior Functioning/Environment Level of Independence: Independent             OT Diagnosis: Generalized weakness;Acute pain   OT Problem List: Decreased strength;Decreased activity tolerance;Decreased safety awareness   OT Treatment/Interventions:                       End of Session Nurse Communication: Mobility status  Activity Tolerance: Patient limited by fatigue;Patient limited by pain Patient left: in chair   Time: 1210-1220 OT Time Calculation (min): 10 min Charges:  OT General Charges $OT Visit: 1 Procedure OT Evaluation $Initial OT Evaluation Tier I: 1 Procedure G-Codes:  Betsy Pries 04/02/2015, 12:30 PM

## 2015-04-02 NOTE — Discharge Instructions (Signed)
Elevate leg above heart 6x a day for 9mnutes each Use knee immobilizer while walking until can SLR x 10 Use knee immobilizer in bed to keep knee in extension Aquacel dressing may remain in place until follow up. May shower with aquacel dressing in place. If the dressing becomes saturated or peels off, you may remove aquacel dressing. Do not remove steri-strips if they are present. Place new dressing with gauze and tape or ACE bandage which should be kept clean and dry and changed daily.  INSTRUCTIONS AFTER JOINT REPLACEMENT   o Remove items at home which could result in a fall. This includes throw rugs or furniture in walking pathways o ICE to the affected joint every three hours while awake for 30 minutes at a time, for at least the first 3-5 days, and then as needed for pain and swelling.  Continue to use ice for pain and swelling. You may notice swelling that will progress down to the foot and ankle.  This is normal after surgery.  Elevate your leg when you are not up walking on it.   o Continue to use the breathing machine you got in the hospital (incentive spirometer) which will help keep your temperature down.  It is common for your temperature to cycle up and down following surgery, especially at night when you are not up moving around and exerting yourself.  The breathing machine keeps your lungs expanded and your temperature down.   DIET:  As you were doing prior to hospitalization, we recommend a well-balanced diet.  DRESSING / WOUND CARE / SHOWERING  Keep the surgical dressing until follow up.  The dressing is water proof, so you can shower without any extra covering.  IF THE DRESSING FALLS OFF or the wound gets wet inside, change the dressing with sterile gauze.  Please use good hand washing techniques before changing the dressing.  Do not use any lotions or creams on the incision until instructed by your surgeon.    ACTIVITY  o Increase activity slowly as tolerated, but follow the  weight bearing instructions below.   o No driving for 6 weeks or until further direction given by your physician.  You cannot drive while taking narcotics.  o No lifting or carrying greater than 10 lbs. until further directed by your surgeon. o Avoid periods of inactivity such as sitting longer than an hour when not asleep. This helps prevent blood clots.  o You may return to work once you are authorized by your doctor.     WEIGHT BEARING   Weight bearing as tolerated with assist device (walker, cane, etc) as directed, use it as long as suggested by your surgeon or therapist, typically at least 4-6 weeks.   EXERCISES  Results after joint replacement surgery are often greatly improved when you follow the exercise, range of motion and muscle strengthening exercises prescribed by your doctor. Safety measures are also important to protect the joint from further injury. Any time any of these exercises cause you to have increased pain or swelling, decrease what you are doing until you are comfortable again and then slowly increase them. If you have problems or questions, call your caregiver or physical therapist for advice.   Rehabilitation is important following a joint replacement. After just a few days of immobilization, the muscles of the leg can become weakened and shrink (atrophy).  These exercises are designed to build up the tone and strength of the thigh and leg muscles and to improve motion. Often  times heat used for twenty to thirty minutes before working out will loosen up your tissues and help with improving the range of motion but do not use heat for the first two weeks following surgery (sometimes heat can increase post-operative swelling).   These exercises can be done on a training (exercise) mat, on the floor, on a table or on a bed. Use whatever works the best and is most comfortable for you.    Use music or television while you are exercising so that the exercises are a pleasant  break in your day. This will make your life better with the exercises acting as a break in your routine that you can look forward to.   Perform all exercises about fifteen times, three times per day or as directed.  You should exercise both the operative leg and the other leg as well.  Exercises include:    Quad Sets - Tighten up the muscle on the front of the thigh (Quad) and hold for 5-10 seconds.    Straight Leg Raises - With your knee straight (if you were given a brace, keep it on), lift the leg to 60 degrees, hold for 3 seconds, and slowly lower the leg.  Perform this exercise against resistance later as your leg gets stronger.   Leg Slides: Lying on your back, slowly slide your foot toward your buttocks, bending your knee up off the floor (only go as far as is comfortable). Then slowly slide your foot back down until your leg is flat on the floor again.   Angel Wings: Lying on your back spread your legs to the side as far apart as you can without causing discomfort.   Hamstring Strength:  Lying on your back, push your heel against the floor with your leg straight by tightening up the muscles of your buttocks.  Repeat, but this time bend your knee to a comfortable angle, and push your heel against the floor.  You may put a pillow under the heel to make it more comfortable if necessary.   A rehabilitation program following joint replacement surgery can speed recovery and prevent re-injury in the future due to weakened muscles. Contact your doctor or a physical therapist for more information on knee rehabilitation.    CONSTIPATION  Constipation is defined medically as fewer than three stools per week and severe constipation as less than one stool per week.  Even if you have a regular bowel pattern at home, your normal regimen is likely to be disrupted due to multiple reasons following surgery.  Combination of anesthesia, postoperative narcotics, change in appetite and fluid intake all can  affect your bowels.   YOU MUST use at least one of the following options; they are listed in order of increasing strength to get the job done.  They are all available over the counter, and you may need to use some, POSSIBLY even all of these options:    Drink plenty of fluids (prune juice may be helpful) and high fiber foods Colace 100 mg by mouth twice a day  Senokot for constipation as directed and as needed Dulcolax (bisacodyl), take with full glass of water  Miralax (polyethylene glycol) once or twice a day as needed.  If you have tried all these things and are unable to have a bowel movement in the first 3-4 days after surgery call either your surgeon or your primary doctor.    If you experience loose stools or diarrhea, hold the medications until you stool  forms back up.  If your symptoms do not get better within 1 week or if they get worse, check with your doctor.  If you experience "the worst abdominal pain ever" or develop nausea or vomiting, please contact the office immediately for further recommendations for treatment.   ITCHING:  If you experience itching with your medications, try taking only a single pain pill, or even half a pain pill at a time.  You can also use Benadryl over the counter for itching or also to help with sleep.   TED HOSE STOCKINGS:  Use stockings on both legs until for at least 2 weeks or as directed by physician office. They may be removed at night for sleeping.  MEDICATIONS:  See your medication summary on the After Visit Summary that nursing will review with you.  You may have some home medications which will be placed on hold until you complete the course of blood thinner medication.  It is important for you to complete the blood thinner medication as prescribed.  PRECAUTIONS:  If you experience chest pain or shortness of breath - call 911 immediately for transfer to the hospital emergency department.   If you develop a fever greater that 101 F, purulent  drainage from wound, increased redness or drainage from wound, foul odor from the wound/dressing, or calf pain - CONTACT YOUR SURGEON.                                                   FOLLOW-UP APPOINTMENTS:  If you do not already have a post-op appointment, please call the office for an appointment to be seen by your surgeon.  Guidelines for how soon to be seen are listed in your After Visit Summary, but are typically between 1-4 weeks after surgery.  OTHER INSTRUCTIONS:   Knee Replacement:  Do not place pillow under knee, focus on keeping the knee straight while resting. CPM instructions: 0-90 degrees, 2 hours in the morning, 2 hours in the afternoon, and 2 hours in the evening. Place foam block, curve side up under heel at all times except when in CPM or when walking.  DO NOT modify, tear, cut, or change the foam block in any way.  MAKE SURE YOU:   Understand these instructions.   Get help right away if you are not doing well or get worse.    Thank you for letting us be a part of your medical care team.  It is a privilege we respect greatly.  We hope these instructions will help you stay on track for a fast and full recovery!   Information on my medicine - XARELTO (Rivaroxaban)  This medication education was reviewed with me or my healthcare representative as part of my discharge preparation.  The pharmacist that spoke with me during my hospital stay was:  Rudean Haskell, [facility]  Why was Xarelto prescribed for you? Xarelto was prescribed for you to reduce the risk of blood clots forming after orthopedic surgery. The medical term for these abnormal blood clots is venous thromboembolism (VTE).  What do you need to know about xarelto ? Take your Xarelto ONCE DAILY at the same time every day. You may take it either with or without food.  If you have difficulty swallowing the tablet whole, you may crush it and mix in applesauce just prior to taking  your dose.  Take Xarelto exactly  as prescribed by your doctor and DO NOT stop taking Xarelto without talking to the doctor who prescribed the medication.  Stopping without other VTE prevention medication to take the place of Xarelto may increase your risk of developing a clot.  After discharge, you should have regular check-up appointments with your healthcare provider that is prescribing your Xarelto.    What do you do if you miss a dose? If you miss a dose, take it as soon as you remember on the same day then continue your regularly scheduled once daily regimen the next day. Do not take two doses of Xarelto on the same day.   Important Safety Information A possible side effect of Xarelto is bleeding. You should call your healthcare provider right away if you experience any of the following: ? Bleeding from an injury or your nose that does not stop. ? Unusual colored urine (red or dark brown) or unusual colored stools (red or black). ? Unusual bruising for unknown reasons. ? A serious fall or if you hit your head (even if there is no bleeding).  Some medicines may interact with Xarelto and might increase your risk of bleeding while on Xarelto. To help avoid this, consult your healthcare provider or pharmacist prior to using any new prescription or non-prescription medications, including herbals, vitamins, non-steroidal anti-inflammatory drugs (NSAIDs) and supplements.  This website has more information on Xarelto: [email or web address]

## 2015-04-02 NOTE — Progress Notes (Signed)
Subjective: 1 Day Post-Op Procedure(s) (LRB): LEFT TOTAL KNEE ARTHROPLASTY (Left) Patient reports pain as 3 on 0-10 scale.    Objective: Vital signs in last 24 hours: Temp:  [87.7 F (30.9 C)-98.4 F (36.9 C)] 98.3 F (36.8 C) (12/09 0409) Pulse Rate:  [66-90] 83 (12/09 0409) Resp:  [13-18] 16 (12/09 0409) BP: (97-124)/(50-76) 118/57 mmHg (12/09 0409) SpO2:  [97 %-100 %] 100 % (12/09 0409)  Intake/Output from previous day: 12/08 0701 - 12/09 0700 In: 3747.5 [P.O.:960; I.V.:2787.5] Out: 2450 [Urine:2400; Blood:50] Intake/Output this shift:     Recent Labs  04/02/15 0402  HGB 10.8*    Recent Labs  04/02/15 0402  WBC 9.6  RBC 3.67*  HCT 32.2*  PLT 252    Recent Labs  03/31/15 1000 04/01/15 0630 04/02/15 0402  NA 141  --  134*  K 2.9* 3.3* 3.4*  CL 106  --  103  CO2 25  --  24  BUN 23*  --  18  CREATININE 1.28*  --  1.24*  GLUCOSE 105*  --  143*  CALCIUM 9.3  --  8.3*   No results for input(s): LABPT, INR in the last 72 hours.  Neurologically intact Sensation intact distally Intact pulses distally Dorsiflexion/Plantar flexion intact Incision: dressing C/D/I Compartment soft  Assessment/Plan: 1 Day Post-Op Procedure(s) (LRB): LEFT TOTAL KNEE ARTHROPLASTY (Left) Advance diet Up with therapy Plan for discharge tomorrow  Deanna Burke C 04/02/2015, 7:06 AM

## 2015-04-02 NOTE — Care Management Note (Signed)
Case Management Note  Patient Details  Name: Deanna Burke MRN: [id number] Date of Birth: [birth year]  Subjective/Objective:  S/p Left Total Knee Arthroplasty                  Action/Plan: Discharge planning per CSW  Expected Discharge Date:                  Expected Discharge Plan:  Skilled Nursing Facility  In-House Referral:  Clinical Social Work  Discharge planning Services  CM Consult  Post Acute Care Choice:  NA Choice offered to:  NA  DME Arranged:  N/A DME Agency:  NA  HH Arranged:  NA HH Agency:  NA  Status of Service:  Completed, signed off  Medicare Important Message Given:    Date Medicare IM Given:    Medicare IM give by:    Date Additional Medicare IM Given:    Additional Medicare Important Message give by:     If discussed at [place] of Stay Meetings, dates discussed:    Additional Comments:  Guadalupe Maple, RN 04/02/2015, 11:40 AM

## 2015-04-02 NOTE — Progress Notes (Signed)
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     PT Comments    POD # 1 pm session. Assisted with amb in hallway slightly greater distance then back to bed to perform TKR TE's followed by ICE.  Limited tolerance due to c/o R hip and "other" knee.    Follow Up Recommendations  SNF     Equipment Recommendations       Recommendations for Other Services       Precautions / Restrictions Precautions Precautions: Knee Precaution Comments: instructed on KI use for amb Required Braces or Orthoses: Knee Immobilizer - Right Knee Immobilizer - Right: Discontinue once straight leg raise with < 10 degree lag Restrictions Weight Bearing Restrictions: No Other Position/Activity Restrictions: WBAT    Mobility  Bed Mobility Overal bed mobility: Needs Assistance Bed Mobility: Supine to Sit     Supine to sit: Min assist Sit to supine: Min assist   General bed mobility comments: min assist to support b LE's back onto bed  Transfers Overall transfer level: Needs assistance Equipment used: Rolling walker (2 wheeled) Transfers: Sit to/from Stand Sit to Stand: Min guard;Min assist         General transfer comment: 25% VC's on proper hand placement and turn completion.  Ambulation/Gait Ambulation/Gait assistance: Min guard Ambulation Distance (Feet): 24 Feet Assistive device: Rolling walker (2 wheeled) Gait Pattern/deviations: Step-to pattern;Decreased stance time - left;Trunk flexed Gait velocity: decreased    General Gait Details: increased time and limited distance due to "other" knee.     Stairs            Wheelchair Mobility    Modified Rankin (Stroke Patients Only)       Balance                                    Cognition Arousal/Alertness:  Awake/alert Behavior During Therapy: WFL for tasks assessed/performed Overall Cognitive Status: Within Functional Limits for tasks assessed                      Exercises   Total Knee Replacement TE's 10 reps B LE ankle pumps 10 reps towel squeezes 10 reps knee presses 10 reps heel slides  10 reps SAQ's 10 reps SLR's 10 reps ABD Followed by ICE     General Comments        Pertinent Vitals/Pain Pain Assessment: 0-10 Pain Score: 8  Pain Location: L knee Pain Descriptors / Indicators: Grimacing;Sore Pain Intervention(s): Monitored during session;Premedicated before session;Repositioned;Ice applied    Home Living Family/patient expects to be discharged to:: Skilled nursing facility (pt states she is planning on heading to SNF for short term prior to home alone) Living Arrangements: Alone                  Prior Function Level of Independence: Independent          PT Goals (current goals can now be found in the care plan section) Progress towards PT goals: Progressing toward goals    Frequency  7X/week    PT Plan Current plan remains appropriate    Co-evaluation             End of Session Equipment Utilized During Treatment:  Gait belt Activity Tolerance: Patient limited by fatigue Patient left: in bed;with call bell/phone within reach;with bed alarm set     Time: 1500-1525 PT Time Calculation (min) (ACUTE ONLY): 25 min  Charges:  $Gait Training: 8-22 mins $Therapeutic Exercise: 8-22 mins                    G Codes:      Rica Koyanagi  PTA [facility]  Acute  Rehab Pager      [phone]

## 2015-04-03 LAB — CBC
HEMATOCRIT: 30.7 % — AB (ref 36.0–46.0)
HEMOGLOBIN: 10.2 g/dL — AB (ref 12.0–15.0)
MCH: 29.3 pg (ref 26.0–34.0)
MCHC: 33.2 g/dL (ref 30.0–36.0)
MCV: 88.2 fL (ref 78.0–100.0)
Platelets: 224 10*3/uL (ref 150–400)
RBC: 3.48 MIL/uL — AB (ref 3.87–5.11)
RDW: 13.2 % (ref 11.5–15.5)
WBC: 8.9 10*3/uL (ref 4.0–10.5)

## 2015-04-03 LAB — BASIC METABOLIC PANEL
ANION GAP: 6 (ref 5–15)
BUN: 18 mg/dL (ref 6–20)
CALCIUM: 8.4 mg/dL — AB (ref 8.9–10.3)
CHLORIDE: 103 mmol/L (ref 101–111)
CO2: 27 mmol/L (ref 22–32)
Creatinine, Ser: 1.06 mg/dL — ABNORMAL HIGH (ref 0.44–1.00)
GFR calc non Af Amer: 53 mL/min — ABNORMAL LOW (ref >60)
Glucose, Bld: 126 mg/dL — ABNORMAL HIGH (ref 65–99)
POTASSIUM: 3.7 mmol/L (ref 3.5–5.1)
Sodium: 136 mmol/L (ref 135–145)

## 2015-04-03 MED ORDER — OXYCODONE-ACETAMINOPHEN 5-325 MG PO TABS: 1.0000 | ORAL_TABLET | ORAL | Status: DC | PRN

## 2015-04-03 MED ORDER — DOCUSATE SODIUM 100 MG PO CAPS: 100.0000 mg | ORAL_CAPSULE | Freq: Two times a day (BID) | ORAL | Status: DC | PRN

## 2015-04-03 MED ORDER — RIVAROXABAN 10 MG PO TABS: 10.0000 mg | ORAL_TABLET | Freq: Every day | ORAL | Status: DC

## 2015-04-03 NOTE — Progress Notes (Signed)
Deanna Burke  MRN: [id number] DOB/Age: [birth year] 64 y.o. Physician: Dr. Tonita Cong Procedure: Procedure(s) (LRB): LEFT TOTAL KNEE ARTHROPLASTY (Left)     Subjective: Patient reports pain and still limits in ability to transfer also related to her known right knee OA  Vital Signs Temp:  [97.7 F (36.5 C)-98.7 F (37.1 C)] 98.7 F (37.1 C) (12/10 0649) Pulse Rate:  [90-95] 90 (12/10 0649) Resp:  [14-16] 14 (12/10 0649) BP: (103-121)/(48-52) 121/48 mmHg (12/10 0649) SpO2:  [97 %-100 %] 97 % (12/10 0649)  Lab Results  Recent Labs  04/02/15 0402 04/03/15 0555  WBC 9.6 8.9  HGB 10.8* 10.2*  HCT 32.2* 30.7*  PLT 252 224   BMET  Recent Labs  04/02/15 0402 04/03/15 0555  NA 134* 136  K 3.4* 3.7  CL 103 103  CO2 24 27  GLUCOSE 143* 126*  BUN 18 18  CREATININE 1.24* 1.06*  CALCIUM 8.3* 8.4*   INR  Date Value Ref Range Status  03/25/2015 1.04 0.00 - 1.49 Final     Exam Knee dressing with aquacel in place. Ace wrap removed        Plan Cont PT/OT today Plan DC to [facility] PA-C   04/03/2015, 9:21 AM Contact # [phone]

## 2015-04-03 NOTE — Plan of Care (Signed)
Problem: Safety: Goal: Ability to remain free from injury will improve Outcome: Progressing Falls prevention protocol maintained. Reviewed with patient and reinforced the importance of adherence to safety measures to reduce likelihood for falls or fall-related injuries, such as appropriate use of the call bell,  maintaining the bed in low and locked position, keeping needed items within easy access and consistent wearing of non-skid footwear during ambulation attempts. Patient verbalized understanding and demonstrated compliance.

## 2015-04-03 NOTE — Progress Notes (Signed)
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     PT Comments    Pt making good progress and able to increase ambulation distance to 40'.  Limited by arthritic pain on contralateral knee.  Con't to recommend SNF.  Follow Up Recommendations  SNF     Equipment Recommendations  Rolling walker with 5" wheels    Recommendations for Other Services       Precautions / Restrictions Precautions Precautions: Knee Precaution Comments: instructed on KI use for amb Required Braces or Orthoses: Knee Immobilizer - Left Knee Immobilizer - Left: Discontinue once straight leg raise with < 10 degree lag Restrictions Weight Bearing Restrictions: Yes LLE Weight Bearing: Weight bearing as tolerated    Mobility  Bed Mobility Overal bed mobility: Needs Assistance Bed Mobility: Supine to Sit     Supine to sit: Min assist     General bed mobility comments: MIN A to support   Transfers Overall transfer level: Needs assistance Equipment used: Rolling walker (2 wheeled) Transfers: Sit to/from Stand Sit to Stand: Min guard         General transfer comment: cues for hand placement and proper technique  Ambulation/Gait Ambulation/Gait assistance: Min guard Ambulation Distance (Feet): 40 Feet Assistive device: Rolling walker (2 wheeled) Gait Pattern/deviations: Decreased step length - right;Decreased step length - left;Antalgic;Trunk flexed Gait velocity: decreased  Gait velocity interpretation: Below normal speed for age/gender General Gait Details: Pt with c/o painful R knee. Cues for posture.   Stairs            Wheelchair Mobility    Modified Rankin (Stroke Patients Only)       Balance           Standing balance support: Bilateral upper extremity supported                         Cognition Arousal/Alertness: Awake/alert Behavior During Therapy: WFL for tasks assessed/performed Overall Cognitive Status: Within Functional Limits for tasks assessed                      Exercises Total Joint Exercises Ankle Circles/Pumps: AROM;Left;10 reps;Supine Quad Sets: Strengthening;Left;10 reps;Supine Heel Slides: AROM;Left;Supine Hip ABduction/ADduction: AAROM;Supine;Left;10 reps Straight Leg Raises: AAROM;Left;Supine;5 reps    General Comments        Pertinent Vitals/Pain Pain Assessment: No/denies pain Pain Score: 4  Pain Location: L knee Pain Descriptors / Indicators: Grimacing;Operative site guarding Pain Intervention(s): Monitored during session;Ice applied;Limited activity within patient's tolerance    Home Living                      Prior Function            PT Goals (current goals can now be found in the care plan section) Acute Rehab PT Goals Patient Stated Goal: to be able to get out and about again and home  PT Goal Formulation: With patient Time For Goal Achievement: 04/15/15 Progress towards PT goals: Progressing toward goals    Frequency  7X/week    PT Plan Current plan remains appropriate    Co-evaluation             End of Session Equipment Utilized During Treatment: Gait belt Activity Tolerance: Patient limited by fatigue Patient left: in  chair;with call bell/phone within reach     Time: 1140-1159 PT Time Calculation (min) (ACUTE ONLY): 19 min  Charges:  $Gait Training: 8-22 mins                    G Codes:      Delano Scardino LUBECK 04/03/2015, 12:19 PM

## 2015-04-03 NOTE — Progress Notes (Signed)
Patient continues to receive multiple-drug therapy for post-operative pain management. Pain was localizable to the operative left lower extremity. Patient was encouraged routine use of the Incentive Spirometer device. Patient was instructed to use the device every  hour while awake at 10 breaths per session and coached to optimal performance. Subsequently, patient demonstrated proficiency in the use of the device.

## 2015-04-03 NOTE — Clinical Social Work Note (Signed)
CSW spoke with pt's RN who confirmed that pt was not ready for discharge today.  MD note reflects discharge Sunday 04/04/15.  CSW will continue to monitor pt until discharge  .Dede Query, LCSW [facility] Clinical Social Worker - Weekend Coverage cell #: [phone]

## 2015-04-04 LAB — URINALYSIS, ROUTINE W REFLEX MICROSCOPIC
BILIRUBIN URINE: NEGATIVE
GLUCOSE, UA: NEGATIVE mg/dL
Ketones, ur: NEGATIVE mg/dL
Nitrite: NEGATIVE
PH: 5.5 (ref 5.0–8.0)
Protein, ur: 30 mg/dL — AB
SPECIFIC GRAVITY, URINE: 1.023 (ref 1.005–1.030)

## 2015-04-04 LAB — CBC
HEMATOCRIT: 29.2 % — AB (ref 36.0–46.0)
HEMOGLOBIN: 9.7 g/dL — AB (ref 12.0–15.0)
MCH: 29.4 pg (ref 26.0–34.0)
MCHC: 33.2 g/dL (ref 30.0–36.0)
MCV: 88.5 fL (ref 78.0–100.0)
Platelets: 228 10*3/uL (ref 150–400)
RBC: 3.3 MIL/uL — ABNORMAL LOW (ref 3.87–5.11)
RDW: 13.3 % (ref 11.5–15.5)
WBC: 7.6 10*3/uL (ref 4.0–10.5)

## 2015-04-04 LAB — BASIC METABOLIC PANEL
ANION GAP: 8 (ref 5–15)
BUN: 21 mg/dL — ABNORMAL HIGH (ref 6–20)
CALCIUM: 8.1 mg/dL — AB (ref 8.9–10.3)
CHLORIDE: 104 mmol/L (ref 101–111)
CO2: 24 mmol/L (ref 22–32)
Creatinine, Ser: 0.99 mg/dL (ref 0.44–1.00)
GFR calc non Af Amer: 58 mL/min — ABNORMAL LOW (ref >60)
GLUCOSE: 120 mg/dL — AB (ref 65–99)
POTASSIUM: 3 mmol/L — AB (ref 3.5–5.1)
Sodium: 136 mmol/L (ref 135–145)

## 2015-04-04 LAB — URINE MICROSCOPIC-ADD ON

## 2015-04-04 MED ORDER — SULFAMETHOXAZOLE-TRIMETHOPRIM 800-160 MG PO TABS
1.0000 | ORAL_TABLET | Freq: Two times a day (BID) | ORAL | Status: DC
  Administered 2015-04-04 – 2015-04-05 (×3): 1 via ORAL
  Filled 2015-04-04 (×4): qty 1

## 2015-04-04 NOTE — Progress Notes (Signed)
Patient ID: Deanna Burke, female   DOB: Dec 30, 1950, 64 y.o.   MRN: [id number]    Subjective: 3 Days Post-Op Procedure(s) (LRB): LEFT TOTAL KNEE ARTHROPLASTY (Left) Patient reports pain as 3 on 0-10 scale.   Denies CP or SOB.  Voiding without difficulty. Positive flatus. Objective: Vital signs in last 24 hours: Temp:  [98.6 F (37 C)-99.5 F (37.5 C)] 99.5 F (37.5 C) (12/11 0434) Pulse Rate:  [98-105] 98 (12/11 0434) Resp:  [16-19] 16 (12/11 0434) BP: (110-131)/(48-60) 110/50 mmHg (12/11 0434) SpO2:  [98 %-100 %] 98 % (12/11 0434)  Intake/Output from previous day: 12/10 0701 - 12/11 0700 In: 720 [P.O.:720] Out: -  Intake/Output this shift:    Labs:  Recent Labs  04/02/15 0402 04/03/15 0555 04/04/15 0530  HGB 10.8* 10.2* 9.7*    Recent Labs  04/03/15 0555 04/04/15 0530  WBC 8.9 7.6  RBC 3.48* 3.30*  HCT 30.7* 29.2*  PLT 224 228    Recent Labs  04/03/15 0555 04/04/15 0530  NA 136 136  K 3.7 3.0*  CL 103 104  CO2 27 24  BUN 18 21*  CREATININE 1.06* 0.99  GLUCOSE 126* 120*  CALCIUM 8.4* 8.1*   No results for input(s): LABPT, INR in the last 72 hours.  Physical Exam: Neurologically intact ABD soft Sensation intact distally Dorsiflexion/Plantar flexion intact Incision: no drainage Compartment soft  Assessment/Plan: 3 Days Post-Op Procedure(s) (LRB): LEFT TOTAL KNEE ARTHROPLASTY (Left) Advance diet Up with therapy  Ordered UA - pt had UTI prior to surgery.  Concerned she still has symptoms Consider DC to SNF today or tommorow  [place], Darla Lesches for Dr. Melina Schools [facility] [phone] 04/04/2015, 7:40 AM

## 2015-04-04 NOTE — Progress Notes (Signed)
UA results discussed via phone with Ronette Deter, [place]. Orders given. Larayne Baxley, [employer]

## 2015-04-04 NOTE — Progress Notes (Signed)
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  She reported dark colored urine, reported this to RN. Pt ambulated to bathroom, then to recliner, overall gait distance limited by 8/10 pain and fatigue. Performed TKA exercises.   Follow Up Recommendations  SNF     Equipment Recommendations  Rolling walker with 5" wheels    Recommendations for Other Services       Precautions / Restrictions Precautions Precautions: Knee Precaution Comments: instructed on KI use for amb Required Braces or Orthoses: Knee Immobilizer - Left Knee Immobilizer - Right: Discontinue once straight leg raise with < 10 degree lag Knee Immobilizer - Left: Discontinue once straight leg raise with < 10 degree lag Restrictions Weight Bearing Restrictions: Yes LLE Weight Bearing: Weight bearing as tolerated Other Position/Activity Restrictions: WBAT    Mobility  Bed Mobility Overal bed mobility: Needs Assistance Bed Mobility: Supine to Sit     Supine to sit: Min assist     General bed mobility comments: MIN A to support LLE; instructed pt to use sheet as leg lifter  Transfers Overall transfer level: Needs assistance Equipment used: Rolling walker (2 wheeled) Transfers: Sit to/from Stand Sit to Stand: Min guard         General transfer comment: cues for hand placement and proper technique  Ambulation/Gait Ambulation/Gait assistance: Min guard Ambulation Distance (Feet): 20 Feet Assistive device: Rolling walker (2 wheeled) Gait Pattern/deviations: Step-to pattern Gait velocity: decreased    General Gait Details: Pt with c/o painful R knee. Cues for posture.Limited by pain   Stairs             Wheelchair Mobility    Modified Rankin (Stroke Patients Only)       Balance     Sitting balance-Leahy Scale: Good     Standing balance support: Bilateral upper extremity supported Standing balance-Leahy Scale: Poor                      Cognition Arousal/Alertness: Awake/alert Behavior During Therapy: WFL for tasks assessed/performed Overall Cognitive Status: Within Functional Limits for tasks assessed                      Exercises Total Joint Exercises Ankle Circles/Pumps: AROM;Left;10 reps;Supine Quad Sets: Strengthening;Left;10 reps;Supine Short Arc Quad: AAROM;Left;10 reps;Supine Heel Slides: AROM;Left;Supine;10 reps Hip ABduction/ADduction: AAROM;Supine;Left;10 reps Goniometric ROM: 0-40 L knee AAROM    General Comments        Pertinent Vitals/Pain Pain Score: 8  Pain Location: L knee Pain Descriptors / Indicators: Sore Pain Intervention(s): RN gave pain meds during session;Ice applied;Limited activity within patient's tolerance    Home Living                      Prior Function            PT Goals (current goals can now be found in the care plan section) Acute Rehab PT Goals Patient Stated Goal: to be able to get out and about again and home  PT Goal Formulation: With patient Time For Goal Achievement: 04/15/15 Progress towards PT goals: Progressing toward goals    Frequency  7X/week    PT Plan Current plan remains appropriate    Co-evaluation             End of Session Equipment Utilized During Treatment: Gait belt Activity Tolerance: Patient limited by fatigue;Patient limited by pain Patient left: in chair;with call bell/phone within reach     Time: 0820-0852 PT Time Calculation (min) (ACUTE ONLY): 32 min  Charges:  $Gait Training: 8-22 mins $Therapeutic Exercise: 8-22 mins                    G Codes:      Philomena Doheny 04/04/2015, 8:55 AM [phone]

## 2015-04-05 MED ORDER — SULFAMETHOXAZOLE-TRIMETHOPRIM 800-160 MG PO TABS: 1.0000 | ORAL_TABLET | Freq: Two times a day (BID) | ORAL | Status: DC

## 2015-04-05 NOTE — Progress Notes (Signed)
Subjective: 4 Days Post-Op Procedure(s) (LRB): LEFT TOTAL KNEE ARTHROPLASTY (Left) Patient reports pain as mild.  Reports UTI symptoms resolved with Bactrim. Right knee pain less today in brace. Feels ready for D/C.  Objective: Vital signs in last 24 hours: Temp:  [98.4 F (36.9 C)-99.6 F (37.6 C)] 98.4 F (36.9 C) (12/12 0606) Pulse Rate:  [88-104] 88 (12/12 0606) Resp:  [15-16] 16 (12/12 0606) BP: (113-120)/(45-50) 113/45 mmHg (12/12 0606) SpO2:  [98 %-100 %] 98 % (12/12 0606)  Intake/Output from previous day: 12/11 0701 - 12/12 0700 In: 840 [P.O.:840] Out: 800 [Urine:250; Stool:550] Intake/Output this shift: Total I/O In: 240 [P.O.:240] Out: 600 [Urine:400; Stool:200]   Recent Labs  04/03/15 0555 04/04/15 0530  HGB 10.2* 9.7*    Recent Labs  04/03/15 0555 04/04/15 0530  WBC 8.9 7.6  RBC 3.48* 3.30*  HCT 30.7* 29.2*  PLT 224 228    Recent Labs  04/03/15 0555 04/04/15 0530  NA 136 136  K 3.7 3.0*  CL 103 104  CO2 27 24  BUN 18 21*  CREATININE 1.06* 0.99  GLUCOSE 126* 120*  CALCIUM 8.4* 8.1*   No results for input(s): LABPT, INR in the last 72 hours.  Neurologically intact ABD soft Neurovascular intact Sensation intact distally Intact pulses distally Dorsiflexion/Plantar flexion intact Incision: dressing C/D/I and no drainage No cellulitis present Compartment soft no sign of DVT  Assessment/Plan: 4 Days Post-Op Procedure(s) (LRB): LEFT TOTAL KNEE ARTHROPLASTY (Left) Advance diet Up with therapy D/C IV fluids  D/C to [facility] today Bactrim course x 5 days for UTI on D/C Discussed with Dr. Tonita Cong Follow up 10-14 days post-op  Deanna Burke M. 04/05/2015, 12:55 PM

## 2015-04-05 NOTE — Progress Notes (Signed)
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 Dx of UTI 04/04/15.    PT Comments    Good progress with mobility today. Pt ambulated 54' with RW using a KI on RLE due to R knee pain. Performed L TKA exercises with min A. Pt is ready to DC to SNF from PT standpoint.  Follow Up Recommendations  SNF     Equipment Recommendations  Rolling walker with 5" wheels    Recommendations for Other Services       Precautions / Restrictions Precautions Precautions: Knee Precaution Comments: used KI on RLE due to pain and weakness Restrictions Weight Bearing Restrictions: No LLE Weight Bearing: Weight bearing as tolerated    Mobility  Bed Mobility               General bed mobility comments: NT- up in chair  Transfers Overall transfer level: Needs assistance Equipment used: Rolling walker (2 wheeled)   Sit to Stand: Supervision         General transfer comment: cues for hand placement and proper technique  Ambulation/Gait Ambulation/Gait assistance: Min guard Ambulation Distance (Feet): 75 Feet Assistive device: Rolling walker (2 wheeled) Gait Pattern/deviations: Step-to pattern;Decreased step length - right;Decreased step length - left Gait velocity: decreased    General Gait Details: used KI on RLE due to pain/weakness from OA, pt reported this was helpful as R knee pain has been limiting her ambulation tolerance, much improved activity tolerance today with overal lincreased gait distance, verbal cues to lift head   Stairs            Wheelchair Mobility    Modified Rankin (Stroke Patients Only)       Balance     Sitting balance-Leahy Scale: Good     Standing balance support: Bilateral upper extremity supported Standing balance-Leahy Scale: Poor                       Cognition Arousal/Alertness: Awake/alert Behavior During Therapy: WFL for tasks assessed/performed Overall Cognitive Status: Within Functional Limits for tasks assessed                      Exercises Total Joint Exercises Ankle Circles/Pumps: AROM;Left;10 reps;Supine Quad Sets: Strengthening;Left;10 reps;Supine Short Arc Quad: AAROM;Left;10 reps;Supine Heel Slides: Left;Supine;10 reps;AAROM Hip ABduction/ADduction: AAROM;Supine;Left;15 reps Straight Leg Raises: 10 reps;Left;AAROM;Supine Long Arc Quad: AROM;Left;10 reps;Seated Knee Flexion: AROM;Left;10 reps;Seated Goniometric ROM: 0-45* L knee AAROM    General Comments        Pertinent Vitals/Pain Pain Score: 5  Pain Location: L knee with activity Pain Descriptors / Indicators: Sore Pain Intervention(s): Limited activity within patient's tolerance;Monitored during session;Premedicated before session;Ice applied    Home Living                      Prior Function            PT Goals (current goals can now be found in the care plan section) Acute Rehab PT Goals Patient Stated Goal: to be able to get out and about again and home  PT Goal Formulation: With patient Time For Goal Achievement: 04/15/15 Progress towards PT goals: Progressing toward goals    Frequency  7X/week    PT Plan Current plan remains appropriate    Co-evaluation  End of Session Equipment Utilized During Treatment: Gait belt Activity Tolerance: Patient tolerated treatment well Patient left: in chair;with call bell/phone within reach     Time: 1139-1207 PT Time Calculation (min) (ACUTE ONLY): 28 min  Charges:  $Gait Training: 8-22 mins $Therapeutic Exercise: 8-22 mins                    G Codes:      Philomena Doheny 04/05/2015, 12:22 PM [phone]

## 2015-04-05 NOTE — Progress Notes (Signed)
CSW assisting wit d/c planning. [employer] authorization has expired due to pt not discharging over the weekend due to UTI. CSW has requested for new authorization for pending d/c to [facility]. CSW will continue to follow to assist with d/c planning to SNF.  Werner Lean LCSW [phone]

## 2015-04-05 NOTE — Progress Notes (Signed)
Report called to [place] at [facility].

## 2015-04-05 NOTE — Progress Notes (Signed)
[employer] medicare has provided authorization for SNF placement today if pt is stable for d/c. [place] has been updated and able to admit when stable. D/C Summary requires updating.  Werner Lean LCSW [phone]

## 2015-04-05 NOTE — Clinical Social Work Placement (Signed)
   CLINICAL SOCIAL WORK PLACEMENT  NOTE  Date:  04/05/2015  Patient Details  Name: Deanna Burke MRN: [id number] Date of Birth: 22-Sep-1950  Clinical Social Work is seeking post-discharge placement for this patient at the [place] level of care (*CSW will initial, date and re-position this form in  chart as items are completed):  No   Patient/family provided with [place] Work Department's list of facilities offering this level of care within the geographic area requested by the patient (or if unable, by the patient's family).  Yes   Patient/family informed of their freedom to choose among providers that offer the needed level of care, that participate in Medicare, Medicaid or managed care program needed by the patient, have an available bed and are willing to accept the patient.  No   Patient/family informed of [facility]'s ownership interest in [facility] and [facility], as well as of the fact that they are under no obligation to receive care at these facilities.  PASRR submitted to EDS on 04/01/15     PASRR number received on 04/01/15     Existing PASRR number confirmed on       FL2 transmitted to all facilities in geographic area requested by pt/family on 04/01/15     FL2 transmitted to all facilities within larger geographic area on       Patient informed that his/her managed care company has contracts with or will negotiate with certain facilities, including the following:        Yes   Patient/family informed of bed offers received.  Patient chooses bed at [facility]     Physician recommends and patient chooses bed at      Patient to be transferred to [facility] on 04/05/15.  Patient to be transferred to facility by [place]     Patient family notified on 04/05/15 of transfer.  Name of family member notified:  Pt notified family directly.     PHYSICIAN       Additional Comment: Pt is in agreement with d/c to [facility] today. PT has approved transport by car. [facility] has reviewed d/c summary, avs, scripts. Scripts included in d/c packet. D/C summary sent to SNF prior to d/c for review. D/C packet provided to pt prior to d/c.  _______________________________________________ Luretha Rued, LCSW  [phone] 04/05/2015, 1:25 PM

## 2015-04-06 ENCOUNTER — Encounter: Payer: Self-pay | Admitting: Adult Health

## 2015-04-06 LAB — URINE CULTURE

## 2015-04-07 ENCOUNTER — Non-Acute Institutional Stay (SKILLED_NURSING_FACILITY): Payer: Medicare Other | Admitting: Internal Medicine

## 2015-04-07 DIAGNOSIS — R2681 Unsteadiness on feet: Secondary | ICD-10-CM

## 2015-04-07 DIAGNOSIS — N3289 Other specified disorders of bladder: Secondary | ICD-10-CM

## 2015-04-07 DIAGNOSIS — D62 Acute posthemorrhagic anemia: Secondary | ICD-10-CM

## 2015-04-07 DIAGNOSIS — N3000 Acute cystitis without hematuria: Secondary | ICD-10-CM

## 2015-04-07 DIAGNOSIS — M1712 Unilateral primary osteoarthritis, left knee: Secondary | ICD-10-CM

## 2015-04-07 DIAGNOSIS — K5900 Constipation, unspecified: Secondary | ICD-10-CM | POA: Diagnosis not present

## 2015-04-07 DIAGNOSIS — I10 Essential (primary) hypertension: Secondary | ICD-10-CM | POA: Insufficient documentation

## 2015-04-07 DIAGNOSIS — D0512 Intraductal carcinoma in situ of left breast: Secondary | ICD-10-CM | POA: Diagnosis not present

## 2015-04-07 DIAGNOSIS — Z932 Ileostomy status: Secondary | ICD-10-CM | POA: Diagnosis not present

## 2015-04-07 DIAGNOSIS — E876 Hypokalemia: Secondary | ICD-10-CM

## 2015-04-07 NOTE — Progress Notes (Signed)
Patient ID: Deanna Burke, female   DOB: [birth year], 64 y.o.   MRN: [id number]      [facility]   PCP: [name], MD  Code Status: full code  Allergies  Allergen Reactions  . Demerol Nausea And Vomiting  . Dilaudid [Hydromorphone Hcl] Nausea And Vomiting and Other (See Comments)    Muscle cramping  . Reglan [Metoclopramide] Other (See Comments)    Cramps in hands  . Stadol [Butorphanol Tartrate] Nausea And Vomiting    Injection  . Cephalexin Rash  . Penicillins Rash    Rash only; note also rash to Keflex    Chief Complaint  Patient presents with  . New Admit To SNF     HPI:  64 y.o. patient is here for short term rehabilitation post hospital admission from 04/01/15-04/05/15 with left knee OA. She underwent left total knee arthroplasty. She is seen in her room today. Her pain is under control with current regimen. She has been working good with therapy team. Denies any concerns this visit.   Review of Systems:  Constitutional: Negative for fever, chills, diaphoresis.  HENT: Negative for headache, congestion, nasal discharge. Eyes: Negative for eye pain, blurred vision, double vision and discharge.  Respiratory: Negative for cough, shortness of breath and wheezing.   Cardiovascular: Negative for chest pain, palpitations. positive for leg swelling.  Gastrointestinal: Negative for heartburn, nausea, vomiting, abdominal pain. Has ileostomy bag and has regular consistency stool output Genitourinary: Negative for dysuria, flank pain.  Musculoskeletal: Negative for back pain, falls. Skin: Negative for itching, rash.  Neurological: Negative for dizziness, tingling, focal weakness Psychiatric/Behavioral: Negative for depression.   Past Medical History  Diagnosis Date  . Ulcerative colitis [phone]  . Pulmonary embolus ([place]) 1971  . Phlebitis 1971  . Small bowel obstruction ([place]) [id number]  . Small bowel perforation ([place])  1971/1996  . Large bowel perforation ([place]) 1971/1996  . Scoliosis   . Torn rotator cuff 2005  . History of bone density study 2012  . Fatigue   . Cataract     Left Eye  . Hot flashes   . 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 Hypertension   . Cancer (HCC)     DCIS L breast  . H/O ulcerative colitis   . Breast cancer ([place])   . Arthritis     hands, lumbar spine, hips, right ankle  . S/P ileostomy ([place])   . S/P radiation therapy 07/24/11 - 09/06/11    Left Breast/ 5000 cGy in 25 Fractions with Boost of 1000 cGy in 5 Fractions  . 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 Abnormal finding on cardiovascular stress test, ischemia anterolateral 02/13/2012    follow heart cath - pt told one tiny blockage that didn't even matter  . Syncope, ? anginal equivilant 02/13/2012  . Ileostomy in place, secondary to ulcerative colitis x 20 years 02/13/2012  . History of recurrent UTIs 02/13/2012  . Sleep apnea 2009    uses cpap-setting is 1  . H/O nephrostomy 08/13/12   Past Surgical History  Procedure Laterality Date  . Colectomy  1973  . Ileostomy  1973  . Vaginoplasty  1975  . Exploratory laparotomy  1978/1990    with lysis of adhesions  . Eye surgery  05/08/2011    left cataract removal   . Appendectomy    . Rotator cuff  repair  2006    Right  . Breast surgery  05/03/11    LEFT BREAST NEEDLE CORE BIOPSY- DCIS  . 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  ER:+, PR+,   . Abdominal hysterectomy  1996    partial  . Exploratory laps      several due to abd pain related to ulcerative colitis  . Cardiac catheterization    . Laparoscopic nephrectomy Right 08/14/2012    Procedure: RIGHT LAPAROSCOPIC RETROPERITONEAL LAPAROSCOPIC NEPHRECTOMY ;  Surgeon: Alexis Frock, MD;  Location: [facility];  Service: Urology;   Laterality: Right;  RIGHT LAPAROSCOPIC RETROPERITONEAL LAPAROSCOPIC NEPHRECTOMY, POSSIBLE OPEN   . Left heart catheterization with coronary angiogram N/A 02/13/2012    Procedure: LEFT HEART CATHETERIZATION WITH CORONARY ANGIOGRAM;  Surgeon: Lorretta Harp, MD;  Location[facility] C[facility] LAB;  Service: Cardiovascular;  Laterality: N/A;  . Total knee arthroplasty Left 04/01/2015    Procedure: LEFT TOTAL KNEE ARTHROPLASTY;  Surgeon: Susa Day, MD;  Location: [facility];  Service: Orthopedics;  Laterality: Left;   Social History:   reports that she has never smoked. She has never used smokeless tobacco. She reports that she does not drink alcohol or use illicit drugs.  Family History  Problem Relation Age of Onset  . Breast cancer Maternal Grandmother   . Stomach cancer Maternal Grandfather   . Cancer Paternal Grandmother     stomach  . Cancer Other     Breast  . Atrial fibrillation Mother   . Heart attack Father   . Atrial fibrillation Brother   . Heart attack Paternal Uncle     Medications:   Medication List       This list is accurate as of: 04/07/15  1:07 PM.  Always use your most recent med list.               acetaminophen 500 MG tablet  Commonly known as:  TYLENOL  Take 1,000 mg by mouth every 6 (six) hours as needed for moderate pain.     DEVROM 200 MG Chew  Generic drug:  Bismuth Subgallate  Chew 400 mg by mouth 4 (four) times daily. For odor control - pt has ileostomy     diclofenac sodium 1 % Gel  Commonly known as:  VOLTAREN  Apply 2 g topically 4 (four) times daily as needed (for pain). For shoulder/ joint pain     docusate sodium 100 MG capsule  Commonly known as:  COLACE  Take 1 capsule (100 mg total) by mouth 2 (two) times daily as needed for mild constipation.     Estradiol 10 MCG Tabs vaginal tablet  Commonly known as:  VAGIFEM  Place 1 tablet (10 mcg total) vaginally 2 (two) times a week.     hydrOXYzine 25 MG tablet  Commonly known as:  ATARAX/VISTARIL   Take 25 mg by mouth at bedtime.     oxybutynin 5 MG tablet  Commonly known as:  DITROPAN  Take 5 mg by mouth every 8 (eight) hours as needed for bladder spasms.     oxyCODONE-acetaminophen 5-325 MG tablet  Commonly known as:  PERCOCET  Take 1-2 tablets by mouth every 4 (four) hours as needed for severe pain.     potassium chloride SA 20 MEQ tablet  Commonly known as:  K-DUR,KLOR-CON  Take 1 tablet (20 mEq total) by mouth daily.     rivaroxaban 10 MG  Tabs tablet  Commonly known as:  XARELTO  Take 1 tablet (10 mg total) by mouth daily.     sulfamethoxazole-trimethoprim 800-160 MG tablet  Commonly known as:  BACTRIM DS,SEPTRA DS  Take 1 tablet by mouth every 12 (twelve) hours.     tamoxifen 20 MG tablet  Commonly known as:  NOLVADEX  Take 1 tablet (20 mg total) by mouth daily.     triamterene-hydrochlorothiazide 37.5-25 MG tablet  Commonly known as:  MAXZIDE-25  Take 1 tablet by mouth every morning.         Physical Exam: Filed Vitals:   04/07/15 1252  BP: 117/53  Pulse: 89  Temp: 99 F (37.2 C)  Resp: 18  SpO2: 98%    General- elderly female, well built, in no acute distress Head- normocephalic, atraumatic Nose- normal nasal mucosa, no maxillary or frontal sinus tenderness, no nasal discharge Throat- moist mucus membrane Eyes- PERRLA, EOMI, no pallor, no icterus, no discharge, normal conjunctiva, normal sclera Neck- no cervical lymphadenopathy Cardiovascular- normal s1,s2, no murmurs, palpable dorsalis pedis and radial pulses, trace right and 1+ left leg edema Respiratory- bilateral clear to auscultation, no wheeze, no rhonchi, no crackles, no use of accessory muscles Abdomen- bowel sounds present, soft, non tender Musculoskeletal- able to move all 4 extremities, limited left knee range of motion with some swelling and warmth present  Neurological- no focal deficit, alert and oriented to person, place and time Skin- warm and dry, left knee surgical incision  with aquacel dressing that is clean and dry Psychiatry- normal mood and affect    Labs reviewed: Basic Metabolic Panel:  Recent Labs  04/02/15 0402 04/03/15 0555 04/04/15 0530  NA 134* 136 136  K 3.4* 3.7 3.0*  CL 103 103 104  CO2 24 27 24   GLUCOSE 143* 126* 120*  BUN 18 18 21*  CREATININE 1.24* 1.06* 0.99  CALCIUM 8.3* 8.4* 8.1*   Liver Function Tests:  Recent Labs  05/04/14 1031  AST 19  ALT 14  ALKPHOS 73  BILITOT 0.74  PROT 7.5  ALBUMIN 3.6   No results for input(s): LIPASE, AMYLASE in the last 8760 hours. No results for input(s): AMMONIA in the last 8760 hours. CBC:  Recent Labs  05/04/14 1030  04/02/15 0402 04/03/15 0555 04/04/15 0530  WBC 5.4  < > 9.6 8.9 7.6  NEUTROABS 3.9  --   --   --   --   HGB 13.4  < > 10.8* 10.2* 9.7*  HCT 40.8  < > 32.2* 30.7* 29.2*  MCV 90.3  < > 87.7 88.2 88.5  PLT 297  < > 252 224 228  < > = values in this interval not displayed.   Radiological Exams: Ir Ivc Filter Plmt / S&i /img Guid/mod Sed  03/31/2015  INDICATION: Remote history of pulmonary embolism and lower extremity DVT, now scheduled to undergo knee surgery. Request made for placement of a prophylactic temporary IVC filter. History of right-sided nephrectomy and chronic renal insufficiency. EXAM: ULTRASOUND GUIDANCE FOR VASCULAR ACCESS IVC CATHETERIZATION AND VENOGRAM IVC FILTER INSERTION COMPARISON:  None. MEDICATIONS: Fentanyl 50 mcg IV; Versed 1 mg IV ANESTHESIA/SEDATION: Sedation Time 11 minutes CONTRAST:  None, CO2 was utilized for this examination. FLUOROSCOPY TIME:  1 minute 36 seconds (214 mGy). COMPLICATIONS: None immediate PROCEDURE: Informed consent was obtained from the patient following explanation of the procedure, risks, benefits and alternatives. The patient understands, agrees and consents for the procedure. All questions were addressed. A time out was performed prior to  the initiation of the procedure. Maximal barrier sterile technique utilized  including caps, mask, sterile gowns, sterile gloves, large sterile drape, hand hygiene, and Betadine prep. Under sterile condition and local anesthesia, right internal jugular venous access was performed with ultrasound. An ultrasound image was saved and sent to PACS. Over a guidewire, the IVC filter delivery sheath and inner dilator were advanced into the IVC just above the IVC bifurcation. Given patient's history of renal insufficiency and solitary kidney, a CO2 injection was performed for an IVC venogram. Through the delivery sheath, a retrievable [place] IVC filter was deployed below the level of the renal veins and above the IVC bifurcation. Limited post deployment CO2 venacavagram was performed. The delivery sheath was removed and hemostasis was obtained with manual compression. A dressing was placed. The patient tolerated the procedure well without immediate post procedural complication. FINDINGS: The IVC is patent. No evidence of thrombus, stenosis, or occlusion. No variant venous anatomy. Successful placement of the IVC filter below the level of the renal veins. IMPRESSION: Successful ultrasound and fluoroscopically guided placement of an infrarenal retrievable IVC filter via right jugular approach. This IVC filter is potentially retrievable. The patient will be assessed for filter retrieval by Interventional Radiology in approximately 8-12 weeks. Further recommendations regarding filter retrieval, continued surveillance or declaration of device permanence, will be made at that time. Electronically Signed   By: Sandi Mariscal M.D.   On: 03/31/2015 12:19   Dg Knee Left Port  04/01/2015  CLINICAL DATA:  64 year old female post knee replacement. Osteoarthritis. Subsequent encounter. EXAM: PORTABLE LEFT KNEE - 1-2 VIEW COMPARISON:  03/25/2015. FINDINGS: Post left total knee replacement without complication noted. Prominent osteophyte/loose body posterior aspect of the knee once again noted. Overlying splint  slightly limits evaluation. IMPRESSION: Post total left knee replacement appearing in satisfactory position as noted above. Electronically Signed   By: [name] M.D.   On: 04/01/2015 11:13     Assessment/Plan  Unsteady gait  Post left knee surgery. Will have her work with physical therapy and occupational therapy team to help with gait training and muscle strengthening exercises.fall precautions. Skin care. Encourage to be out of bed.   Left knee OA S/p left TKA. Has f/u with orthopedics. Continue percocet 5-325 mg 1-2 tab q4h prn pain. Continue xarelto for dvt prophylaxis. Will have patient work with PT/OT as tolerated to regain strength and restore function.  Fall precautions are in place.  UTI Continue and complete course of bactrim on 04/10/15. Currently asymptomatic. Hydration to be maintained  Blood loss anemia Post op, monitor h&h  Hypokalemia Continue kcl supplement, monitor bmp  HTN Monitor bp, continue maxzide 37.5-25 mg daily with vistaril 25 mg daily, check bmp  Constipation Continue colace 100 mg bid as needed  Bladder spasm Continue oxybutynin 5 mg q8h prn   Ulcerative colitis s/p ileostomy Continue ileostomy site care. Continue colace bid prn and devrom qid  Hx of breast cancer Continue tamoxifen current regimen   Goals of care: short term rehabilitation   Labs/tests ordered: cbc, cmp  Family/ staff Communication: reviewed care plan with patient and nursing supervisor    Blanchie Serve, MD  [facility] [phone] (Monday-Friday 8 am - 5 pm) [phone] (afterhours)

## 2015-04-20 ENCOUNTER — Encounter: Payer: Self-pay | Admitting: Adult Health

## 2015-05-03 ENCOUNTER — Other Ambulatory Visit (HOSPITAL_BASED_OUTPATIENT_CLINIC_OR_DEPARTMENT_OTHER): Payer: Medicare Other

## 2015-05-03 DIAGNOSIS — D0512 Intraductal carcinoma in situ of left breast: Secondary | ICD-10-CM

## 2015-05-03 DIAGNOSIS — E871 Hypo-osmolality and hyponatremia: Secondary | ICD-10-CM

## 2015-05-03 DIAGNOSIS — C50412 Malignant neoplasm of upper-outer quadrant of left female breast: Secondary | ICD-10-CM | POA: Diagnosis not present

## 2015-05-03 DIAGNOSIS — N179 Acute kidney failure, unspecified: Secondary | ICD-10-CM

## 2015-05-03 DIAGNOSIS — R9439 Abnormal result of other cardiovascular function study: Secondary | ICD-10-CM

## 2015-05-03 LAB — COMPREHENSIVE METABOLIC PANEL
ALBUMIN: 3.6 g/dL (ref 3.5–5.0)
ALK PHOS: 77 U/L (ref 40–150)
ALT: <9 U/L (ref 0–55)
AST: 17 U/L (ref 5–34)
Anion Gap: 11 mEq/L (ref 3–11)
BILIRUBIN TOTAL: 0.59 mg/dL (ref 0.20–1.20)
BUN: 20.6 mg/dL (ref 7.0–26.0)
CO2: 25 mEq/L (ref 22–29)
CREATININE: 1.5 mg/dL — AB (ref 0.6–1.1)
Calcium: 10.1 mg/dL (ref 8.4–10.4)
Chloride: 105 mEq/L (ref 98–109)
EGFR: 36 mL/min/{1.73_m2} — AB (ref >90)
GLUCOSE: 96 mg/dL (ref 70–140)
Potassium: 4 mEq/L (ref 3.5–5.1)
Sodium: 140 mEq/L (ref 136–145)
TOTAL PROTEIN: 7.7 g/dL (ref 6.4–8.3)

## 2015-05-03 LAB — CBC WITH DIFFERENTIAL/PLATELET
BASO%: 0.9 % (ref 0.0–2.0)
Basophils Absolute: 0.1 10*3/uL (ref 0.0–0.1)
EOS ABS: 0.1 10*3/uL (ref 0.0–0.5)
EOS%: 0.7 % (ref 0.0–7.0)
HEMATOCRIT: 37.6 % (ref 34.8–46.6)
HGB: 12.2 g/dL (ref 11.6–15.9)
LYMPH#: 1.3 10*3/uL (ref 0.9–3.3)
LYMPH%: 16.4 % (ref 14.0–49.7)
MCH: 29 pg (ref 25.1–34.0)
MCHC: 32.4 g/dL (ref 31.5–36.0)
MCV: 89.7 fL (ref 79.5–101.0)
MONO#: 0.4 10*3/uL (ref 0.1–0.9)
MONO%: 5.2 % (ref 0.0–14.0)
NEUT%: 76.8 % (ref 38.4–76.8)
NEUTROS ABS: 5.9 10*3/uL (ref 1.5–6.5)
PLATELETS: 471 10*3/uL — AB (ref 145–400)
RBC: 4.19 10*6/uL (ref 3.70–5.45)
RDW: 14.2 % (ref 11.2–14.5)
WBC: 7.6 10*3/uL (ref 3.9–10.3)

## 2015-05-10 ENCOUNTER — Ambulatory Visit
Admission: RE | Admit: 2015-05-10 | Discharge: 2015-05-10 | Disposition: A | Discharge status: Home / Self Care | Payer: Medicare Other | Source: Ambulatory Visit | Attending: Oncology | Admitting: Oncology

## 2015-05-10 ENCOUNTER — Telehealth: Payer: Self-pay | Admitting: Oncology

## 2015-05-10 ENCOUNTER — Ambulatory Visit (HOSPITAL_BASED_OUTPATIENT_CLINIC_OR_DEPARTMENT_OTHER): Payer: Medicare Other | Admitting: Oncology

## 2015-05-10 VITALS — BP 109/66 | HR 120 | Temp 99.0°F | Resp 20 | Ht 64.0 in | Wt 169.9 lb

## 2015-05-10 DIAGNOSIS — D0512 Intraductal carcinoma in situ of left breast: Secondary | ICD-10-CM

## 2015-05-10 DIAGNOSIS — I10 Essential (primary) hypertension: Secondary | ICD-10-CM

## 2015-05-10 DIAGNOSIS — C50412 Malignant neoplasm of upper-outer quadrant of left female breast: Secondary | ICD-10-CM

## 2015-05-10 DIAGNOSIS — K5190 Ulcerative colitis, unspecified, without complications: Secondary | ICD-10-CM | POA: Diagnosis not present

## 2015-05-10 DIAGNOSIS — E876 Hypokalemia: Secondary | ICD-10-CM

## 2015-05-10 DIAGNOSIS — M545 Low back pain: Secondary | ICD-10-CM

## 2015-05-10 DIAGNOSIS — Z853 Personal history of malignant neoplasm of breast: Secondary | ICD-10-CM

## 2015-05-10 DIAGNOSIS — K51018 Ulcerative (chronic) pancolitis with other complication: Secondary | ICD-10-CM

## 2015-05-10 DIAGNOSIS — Z932 Ileostomy status: Secondary | ICD-10-CM | POA: Insufficient documentation

## 2015-05-10 DIAGNOSIS — N179 Acute kidney failure, unspecified: Secondary | ICD-10-CM

## 2015-05-10 DIAGNOSIS — Z905 Acquired absence of kidney: Secondary | ICD-10-CM | POA: Insufficient documentation

## 2015-05-10 MED ORDER — TAMOXIFEN CITRATE 20 MG PO TABS: 20.0000 mg | ORAL_TABLET | Freq: Every day | ORAL | Status: DC

## 2015-05-10 MED ORDER — ESTRADIOL 10 MCG VA TABS: 1.0000 | ORAL_TABLET | VAGINAL | Status: DC

## 2015-05-10 NOTE — Telephone Encounter (Signed)
Appointments made and avs printed for patient °

## 2015-05-10 NOTE — Progress Notes (Signed)
ID: Sharyne Richters OB: 1950/10/08  MR#: [id number]  LHT#:[id number]  PCP: [name], MD GYN:  Omelia Blackwater SU: Coralie Keens, MD OTHER MD: Jamal Maes, MD;  Alexis Frock, MD, Eppie Gibson, MD, Susa Day MD  CHIEF COMPLAINT: Estrogen receptor positive Left Breast Cancer  CURRENT TREATMENT: Tamoxifen   HISTORY OF PRESENT ILLNESS: From the original intake note:  Deanna Burke had routine screening mammography 03/22/2011 at [facility]. This showed new microcalcifications in the left breast. She was recalled for additional views April 07, 2011, and these showed the calcifications to be pleomorphic and linear. 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   With this information the patient was referred for bilateral breast MRI, performed 04/27/2011. This showed no suspicious findings on the right. On the left there was 11 mm lobulated mass 5 cm from the prior biopsy clip. 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   Patient underwent left lumpectomy in February 2013 with negative margins.   Additional treatment history is detailed below.  INTERVAL HISTORY: Deanna Burke returns today for follow-up of her  Ductal carcinoma in situ. She has been on tamoxifen since May 2013. She obtains it at an [facility] and has no significant side effects including no hot flashes or vaginal wetness. The main purpose of the tamoxifen in her case is to allow her to safely use vaginal estrogens, currently vaginal suppositories 4 and week. This is working well for her.   REVIEW OF SYSTEMS: Deanna Burke had a left total knee replacement December 2016. She is still being rehabbed for that. She has to take pain medicine before the rehabilitation. She also has some right shoulder pain and she tells me she is going to need to  have the right knee done at some point. There is also back pain. These are not new problems. A detailed review of systems today was otherwise entirely stable.  PAST MEDICAL HISTORY: Past Medical History  Diagnosis Date  . Ulcerative colitis [phone]  . Pulmonary embolus ([place]) 1971  . Phlebitis 1971  . Small bowel obstruction ([place]) [id number]  . Small bowel perforation ([place]) 1971/1996  . Large bowel perforation ([place]) 1971/1996  . Scoliosis   . Torn rotator cuff 2005  . History of bone density study 2012  . Fatigue   . Cataract     Left Eye  . Hot flashes   . 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 Hypertension   . Cancer (HCC)     DCIS L breast  . H/O ulcerative colitis   . Breast cancer ([place])   . Arthritis     hands, lumbar spine, hips, right ankle  . S/P ileostomy ([place])   . S/P radiation therapy 07/24/11 - 09/06/11    Left Breast/ 5000 cGy in 25 Fractions with Boost of 1000 cGy in 5 Fractions  . 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 Abnormal finding on cardiovascular stress test, ischemia anterolateral 02/13/2012    follow heart cath - pt told one tiny blockage that didn't even matter  . Syncope, ? anginal equivilant 02/13/2012  . Ileostomy in place, secondary to ulcerative colitis x 20 years 02/13/2012  . History of recurrent UTIs 02/13/2012  . Sleep apnea 2009    uses cpap-setting is 1  .  H/O nephrostomy 08/13/12    PAST SURGICAL HISTORY: Past Surgical History  Procedure Laterality Date  . Colectomy  1973  . Ileostomy  1973  . Vaginoplasty  1975  . Exploratory laparotomy  1978/1990    with lysis of adhesions  . Eye surgery  05/08/2011    left cataract removal   . Appendectomy    . Rotator cuff repair  2006    Right  . Breast surgery  05/03/11    LEFT BREAST NEEDLE CORE BIOPSY- DCIS  . Breast lumpectomy  05/31/11    LEFT BREAST LUMPECTOMY, NEGATIVE MARGINS, HIGH GRADE  DUCTAL   CARCINOMA IN SITU WITH ASSOCIATED CALCIFICATIONS.  ER:+, PR+,   . Abdominal hysterectomy  1996    partial  . Exploratory laps      several due to abd pain related to ulcerative colitis  . Cardiac catheterization    . Laparoscopic nephrectomy Right 08/14/2012    Procedure: RIGHT LAPAROSCOPIC RETROPERITONEAL LAPAROSCOPIC NEPHRECTOMY ;  Surgeon: Alexis Frock, MD;  Location: [facility] ORS;  Service: Urology;  Laterality: Right;  RIGHT LAPAROSCOPIC RETROPERITONEAL LAPAROSCOPIC NEPHRECTOMY, POSSIBLE OPEN   . Left heart catheterization with coronary angiogram N/A 02/13/2012    Procedure: LEFT HEART CATHETERIZATION WITH CORONARY ANGIOGRAM;  Surgeon: Lorretta Harp, MD;  Location: [facility] CATH LAB;  Service: Cardiovascular;  Laterality: N/A;  . Total knee arthroplasty Left 04/01/2015    Procedure: LEFT TOTAL KNEE ARTHROPLASTY;  Surgeon: Susa Day, MD;  Location: [facility] ORS;  Service: Orthopedics;  Laterality: Left;    FAMILY HISTORY Family History  Problem Relation Age of Onset  . Breast cancer Maternal Grandmother   . Stomach cancer Maternal Grandfather   . Cancer Paternal Grandmother     stomach  . Cancer Other     Breast  . Atrial fibrillation Mother   . Heart attack Father   . Atrial fibrillation Brother   . Heart attack Paternal Uncle   She had one brothers no sisters. There is no history of breast or ovarian cancer in the immediate family.   GYNECOLOGIC HISTORY:  (Reviewed 10/06/2013) menarche age 89, underwent hysterectomy and bilateral salpingo-oophorectomy in 1996. She took hormone replacement for 16 years, stopping only at the time of this diagnosis. She is GX P0.  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 She is divorced. She lives by herself, with no pets.     ADVANCED DIRECTIVES: Not in place   HEALTH MAINTENANCE:  (Updated 10/06/2013) Social History  Substance Use Topics  . Smoking status: Never Smoker   . Smokeless tobacco: Never  Used  . Alcohol Use: No     Colonoscopy: s/p ileostomy for ulcerative colitis  PAP: 04/23/2014  Bone density: 2007 - Normal  Lipid panel: Not on file/Dr. Arvind  Allergies  Allergen Reactions  . Demerol Nausea And Vomiting  . Dilaudid [Hydromorphone Hcl] Nausea And Vomiting and Other (See Comments)    Muscle cramping  . Reglan [Metoclopramide] Other (See Comments)    Cramps in hands  . Stadol [Butorphanol Tartrate] Nausea And Vomiting    Injection  . Cephalexin Rash  . Penicillins Rash    Rash only; note also rash to Keflex    Current Outpatient Prescriptions  Medication Sig Dispense Refill  . acetaminophen (TYLENOL) 500 MG tablet Take 1,000 mg by mouth every 6 (six) hours as needed for moderate pain.    . Bismuth Subgallate (DEVROM) 200 MG CHEW Chew 400 mg by mouth 4 (four) times daily. For odor  control - pt has ileostomy    . diclofenac sodium (VOLTAREN) 1 % GEL Apply 2 g topically 4 (four) times daily as needed (for pain). For shoulder/ joint pain    . Estradiol (VAGIFEM) 10 MCG TABS vaginal tablet Place 1 tablet (10 mcg total) vaginally 2 (two) times a week. 24 tablet 12  . hydrOXYzine (ATARAX/VISTARIL) 25 MG tablet Take 25 mg by mouth at bedtime.    . methocarbamol (ROBAXIN) 500 MG tablet Take 500 mg by mouth every 6 (six) hours as needed for muscle spasms.    . Multiple Vitamins-Minerals (CERTAVITE SENIOR/ANTIOXIDANT PO) Take by mouth daily.    [name] oxybutynin (DITROPAN) 5 MG tablet Take 5 mg by mouth every 8 (eight) hours as needed for bladder spasms.     [name] oxyCODONE-acetaminophen (PERCOCET) 5-325 MG tablet Take 1-2 tablets by mouth every 4 (four) hours as needed for severe pain. 62 tablet 0  . potassium chloride SA (K-DUR,KLOR-CON) 20 MEQ tablet Take 1 tablet (20 mEq total) by mouth daily. 30 tablet 0  . Protein (PROCEL) POWD Take 2 scoop by mouth 2 (two) times daily.    . rivaroxaban (XARELTO) 10 MG TABS tablet Take 1 tablet (10 mg total) by mouth daily. 22 tablet 0  .  tamoxifen (NOLVADEX) 20 MG tablet Take 1 tablet (20 mg total) by mouth daily. 90 tablet 3  . triamterene-hydrochlorothiazide (MAXZIDE-25) 37.5-25 MG per tablet Take 1 tablet by mouth every morning.     No current facility-administered medications for this visit.    OBJECTIVE: Middle-aged white woman who appears stated age 65 Vitals:   05/10/15 1418  BP: 109/66  Pulse: 120  Temp: 99 F (37.2 C)  Resp: 20     Body mass index is 29.15 kg/(m^2).    ECOG FS: 1 Filed Weights   05/10/15 1418  Weight: 169 lb 14.4 oz (77.066 kg)   Sclerae unicteric, EOMs intact Oropharynx clear, dentition in good repair No cervical or supraclavicular adenopathy Lungs no rales or rhonchi Heart regular rate and rhythm Abd soft, nontender, positive bowel sounds MSK  Using a cane today;no focal spinal tenderness, no upper extremity lymphedema ; fair range of motion left knee Neuro: nonfocal, well oriented, appropriate affect Breasts:  The right breast is unremarkable. The left breast is status post lumpectomy and radiation, with no evidence of local recurrence. The left axilla is benign.   LAB RESULTS:    Lab Results  Component Value Date   WBC 7.6 05/03/2015   NEUTROABS 5.9 05/03/2015   HGB 12.2 05/03/2015   HCT 37.6 05/03/2015   MCV 89.7 05/03/2015   PLT 471* 05/03/2015      Chemistry      Component Value Date/Time   NA 140 05/03/2015 1402   NA 136 04/04/2015 0530   K 4.0 05/03/2015 1402   K 3.0* 04/04/2015 0530   CL 104 04/04/2015 0530   CL 105 10/09/2012 1103   CO2 25 05/03/2015 1402   CO2 24 04/04/2015 0530   BUN 20.6 05/03/2015 1402   BUN 21* 04/04/2015 0530   CREATININE 1.5* 05/03/2015 1402   CREATININE 0.99 04/04/2015 0530      Component Value Date/Time   CALCIUM 10.1 05/03/2015 1402   CALCIUM 8.1* 04/04/2015 0530   ALKPHOS 77 05/03/2015 1402   ALKPHOS 53 12/31/2013 1133   AST 17 05/03/2015 1402   AST 21 12/31/2013 1133   ALT <9 05/03/2015 1402   ALT 16 12/31/2013  1133   BILITOT 0.59 05/03/2015 1402  BILITOT 1.1 12/31/2013 1133       STUDIES:  mammography pending 05/10/2015    ASSESSMENT: 65 y.o. [place] woman   (1)  status post left lumpectomy 05/31/2011, for ductal carcinoma in situ, involving 2 sites, both estrogen and progesterone receptor positive, high-grade, with negative margins.   (2)  Completed adjuvant radiation May 2013  (3)  began on tamoxifen in May 2013  (4)  status post right nephrectomy in April 2014 with benign pathology  (5)  Hypercalcemia-- intermittent -- resolved  (6)  history of hypokalemia, on triamterene/HCTZ for history of hypertension - normalized and stable with oral potassium supplementation    PLAN: Deanna Burke continues on tamoxifen, with excellent tolerance. The main reason she is on this drug is really not her noninvasive breast cancer but the vaginal dryness and associated problems. The tamoxifen allows her to safely use vaginal estrogens, which she is doing successfully. Accordingly the plan will be to continue tamoxifen for a total of 10 years.    her creatinine has gone up a little bit, and she tells me she already has a follow-up appointment with Dr. Lorrene Reid.  Her ulcerative Wynonia Lawman his is stable.   she will have her mammography today. She will return to see me in a year, after next year's mammogram. Of course I will be glad to see her at anytime before then if the need arises. Chauncey Cruel, MD   05/10/2015 2:41 PM

## 2015-07-14 ENCOUNTER — Other Ambulatory Visit (HOSPITAL_COMMUNITY): Payer: Self-pay | Admitting: Interventional Radiology

## 2015-07-14 ENCOUNTER — Telehealth: Payer: Self-pay | Admitting: *Deleted

## 2015-07-14 DIAGNOSIS — Z86718 Personal history of other venous thrombosis and embolism: Secondary | ICD-10-CM

## 2015-07-14 NOTE — Telephone Encounter (Signed)
I called Mr. Gautney to schedule office visit to possibly remove IVC Filter. Patient states she had lft knee replacement on April 01, 2015 and is going to have her Rt knee replaced in June 2017. She would like to leave the filter in place until after she recovers from the knee surgery. Per Dr Annamaria Boots that will be ok. I will f/u with the patient in September 2017 to schedule poss removal.

## 2015-07-22 ENCOUNTER — Ambulatory Visit: Payer: Self-pay | Admitting: Orthopedic Surgery

## 2015-08-31 ENCOUNTER — Ambulatory Visit: Payer: Self-pay | Admitting: Orthopedic Surgery

## 2015-09-02 ENCOUNTER — Ambulatory Visit: Payer: Self-pay | Admitting: Orthopedic Surgery

## 2015-09-30 ENCOUNTER — Inpatient Hospital Stay: Admit: 2015-09-30 | Payer: Medicare Other | Admitting: Specialist

## 2015-09-30 SURGERY — ARTHROPLASTY, KNEE, TOTAL
Anesthesia: Spinal | Site: Knee | Laterality: Right

## 2015-10-28 ENCOUNTER — Encounter (HOSPITAL_COMMUNITY)
Admission: RE | Admit: 2015-10-28 | Discharge: 2015-10-28 | Disposition: A | Discharge status: Home / Self Care | Payer: Medicare Other | Source: Ambulatory Visit | Attending: Specialist | Admitting: Specialist

## 2015-10-28 ENCOUNTER — Ambulatory Visit (HOSPITAL_COMMUNITY)
Admission: RE | Admit: 2015-10-28 | Discharge: 2015-10-28 | Disposition: A | Discharge status: Home / Self Care | Payer: Medicare Other | Source: Ambulatory Visit | Attending: Orthopedic Surgery | Admitting: Orthopedic Surgery

## 2015-10-28 ENCOUNTER — Encounter (HOSPITAL_COMMUNITY): Payer: Self-pay

## 2015-10-28 DIAGNOSIS — M11261 Other chondrocalcinosis, right knee: Secondary | ICD-10-CM | POA: Diagnosis not present

## 2015-10-28 DIAGNOSIS — M1711 Unilateral primary osteoarthritis, right knee: Secondary | ICD-10-CM

## 2015-10-28 LAB — URINALYSIS, ROUTINE W REFLEX MICROSCOPIC
BILIRUBIN URINE: NEGATIVE
Glucose, UA: NEGATIVE mg/dL
KETONES UR: NEGATIVE mg/dL
NITRITE: NEGATIVE
PH: 6 (ref 5.0–8.0)
Protein, ur: NEGATIVE mg/dL
Specific Gravity, Urine: 1.016 (ref 1.005–1.030)

## 2015-10-28 LAB — CBC
HEMATOCRIT: 40.2 % (ref 36.0–46.0)
HEMOGLOBIN: 13.3 g/dL (ref 12.0–15.0)
MCH: 29.2 pg (ref 26.0–34.0)
MCHC: 33.1 g/dL (ref 30.0–36.0)
MCV: 88.2 fL (ref 78.0–100.0)
Platelets: 296 10*3/uL (ref 150–400)
RBC: 4.56 MIL/uL (ref 3.87–5.11)
RDW: 13.2 % (ref 11.5–15.5)
WBC: 5.3 10*3/uL (ref 4.0–10.5)

## 2015-10-28 LAB — BASIC METABOLIC PANEL
ANION GAP: 8 (ref 5–15)
BUN: 30 mg/dL — ABNORMAL HIGH (ref 6–20)
CHLORIDE: 104 mmol/L (ref 101–111)
CO2: 27 mmol/L (ref 22–32)
Calcium: 10.1 mg/dL (ref 8.9–10.3)
Creatinine, Ser: 1.28 mg/dL — ABNORMAL HIGH (ref 0.44–1.00)
GFR calc non Af Amer: 43 mL/min — ABNORMAL LOW (ref >60)
GFR, EST AFRICAN AMERICAN: 49 mL/min — AB (ref >60)
Glucose, Bld: 96 mg/dL (ref 65–99)
Potassium: 3.7 mmol/L (ref 3.5–5.1)
Sodium: 139 mmol/L (ref 135–145)

## 2015-10-28 LAB — PROTIME-INR
INR: 0.96 (ref 0.00–1.49)
Prothrombin Time: 13 seconds (ref 11.6–15.2)

## 2015-10-28 LAB — URINE MICROSCOPIC-ADD ON

## 2015-10-28 LAB — SURGICAL PCR SCREEN
MRSA, PCR: NEGATIVE
Staphylococcus aureus: NEGATIVE

## 2015-10-28 NOTE — Patient Instructions (Addendum)
Deanna Burke  10/28/2015   Your procedure is scheduled on: 11-04-15  Report to [facility] Main  Entrance take [facility]  elevators to 3rd floor to  [place] at  [place] AM.  Call this number if you have problems the morning of surgery [place] of attorney/ Living will forms.  Remember: ONLY 1 PERSON MAY GO WITH YOU TO SHORT STAY TO GET  READY MORNING OF YOUR SURGERY.  Do not eat food or drink liquids :After Midnight.     Take these medicines the morning of surgery with A SIP OF WATER: Tamoxifen. DO NOT TAKE ANY DIABETIC MEDICATIONS DAY OF YOUR SURGERY                               You may not have any metal on your body including hair pins and              piercings  Do not wear jewelry, make-up, lotions, powders or perfumes, deodorant             Do not wear nail polish.  Do not shave  48 hours prior to surgery.              Men may shave face and neck.   Do not bring valuables to the hospital. [name].  Contacts, dentures or bridgework may not be worn into surgery.  Leave suitcase in the car. After surgery it may be brought to your room.     Patients discharged the day of surgery will not be allowed to drive home.  Name and phone number of your driver:Deanna Burke-[phone]  Special Instructions: N/A              Please read over the following fact sheets you were given: _____________________________________________________________________             [facility] - Preparing for Surgery Before surgery, you can play an important role.  Because skin is not sterile, your skin needs to be as free of germs as possible.  You can reduce the number of germs on your skin by washing with CHG (chlorahexidine gluconate) soap before surgery.  CHG is an antiseptic cleaner which kills germs and bonds with the skin to continue killing germs even after washing. Please DO NOT  use if you have an allergy to CHG or antibacterial soaps.  If your skin becomes reddened/irritated stop using the CHG and inform your nurse when you arrive at Short Stay. Do not shave (including legs and underarms) for at least 48 hours prior to the first CHG shower.  You may shave your face/neck. Please follow these instructions carefully:  1.  Shower with CHG Soap the night before surgery and the  morning of Surgery.  2.  If you choose to wash your hair, wash your hair first as usual with your  normal  shampoo.  3.  After you shampoo, rinse your hair and body thoroughly to remove the  shampoo.                           4.  Use CHG as you would any other liquid soap.  You  can apply chg directly  to the skin and wash                       Gently with a scrungie or clean washcloth.  5.  Apply the CHG Soap to your body ONLY FROM THE NECK DOWN.   Do not use on face/ open                           Wound or open sores. Avoid contact with eyes, ears mouth and genitals (private parts).                       Wash face,  Genitals (private parts) with your normal soap.             6.  Wash thoroughly, paying special attention to the area where your surgery  will be performed.  7.  Thoroughly rinse your body with warm water from the neck down.  8.  DO NOT shower/wash with your normal soap after using and rinsing off  the CHG Soap.                9.  Pat yourself dry with a clean towel.            10.  Wear clean pajamas.            11.  Place clean sheets on your bed the night of your first shower and do not  sleep with pets. Day of Surgery : Do not apply any lotions/deodorants the morning of surgery.  Please wear clean clothes to the hospital/surgery center.  FAILURE TO FOLLOW THESE INSTRUCTIONS MAY RESULT IN THE CANCELLATION OF YOUR SURGERY PATIENT SIGNATURE_________________________________  NURSE  SIGNATURE__________________________________  ________________________________________________________________________   Deanna Burke  An incentive spirometer is a tool that can help keep your lungs clear and active. This tool measures how well you are filling your lungs with each breath. Taking long deep breaths may help reverse or decrease the chance of developing breathing (pulmonary) problems (especially infection) following:  A long period of time when you are unable to move or be active. BEFORE THE PROCEDURE   If the spirometer includes an indicator to show your best effort, your nurse or respiratory therapist will set it to a desired goal.  If possible, sit up straight or lean slightly forward. Try not to slouch.  Hold the incentive spirometer in an upright position. INSTRUCTIONS FOR USE  1. Sit on the edge of your bed if possible, or sit up as far as you can in bed or on a chair. 2. Hold the incentive spirometer in an upright position. 3. Breathe out normally. 4. Place the mouthpiece in your mouth and seal your lips tightly around it. 5. Breathe in slowly and as deeply as possible, raising the piston or the ball toward the top of the column. 6. Hold your breath for 3-5 seconds or for as long as possible. Allow the piston or ball to fall to the bottom of the column. 7. Remove the mouthpiece from your mouth and breathe out normally. 8. Rest for a few seconds and repeat Steps 1 through 7 at least 10 times every 1-2 hours when you are awake. Take your time and take a few normal breaths between deep breaths. 9. The spirometer may include an indicator to show your best effort. Use the indicator as a goal to work toward during  each repetition. 10. After each set of 10 deep breaths, practice coughing to be sure your lungs are clear. If you have an incision (the cut made at the time of surgery), support your incision when coughing by placing a pillow or rolled up towels firmly  against it. Once you are able to get out of bed, walk around indoors and cough well. You may stop using the incentive spirometer when instructed by your caregiver.  RISKS AND COMPLICATIONS  Take your time so you do not get dizzy or light-headed.  If you are in pain, you may need to take or ask for pain medication before doing incentive spirometry. It is harder to take a deep breath if you are having pain. AFTER USE  Rest and breathe slowly and easily.  It can be helpful to keep track of a log of your progress. Your caregiver can provide you with a simple table to help with this. If you are using the spirometer at home, follow these instructions: [place] IF:   You are having difficultly using the spirometer.  You have trouble using the spirometer as often as instructed.  Your pain medication is not giving enough relief while using the spirometer.  You develop fever of 100.5 F (38.1 C) or higher. SEEK IMMEDIATE MEDICAL CARE IF:   You cough up bloody sputum that had not been present before.  You develop fever of 102 F (38.9 C) or greater.  You develop worsening pain at or near the incision site. MAKE SURE YOU:   Understand these instructions.  Will watch your condition.  Will get help right away if you are not doing well or get worse. Document Released: 08/21/2006 Document Revised: 07/03/2011 Document Reviewed: 10/22/2006 [facility] Patient Information 2014 [place], [place].   ________________________________________________________________________

## 2015-10-28 NOTE — Pre-Procedure Instructions (Signed)
10-28-15 1545 Note urine, BMP results-BUN,Creatinine-viewable in Epic.Note sent to pod [phone].

## 2015-10-28 NOTE — Pre-Procedure Instructions (Signed)
EKG 12'16 Epic.

## 2015-10-29 ENCOUNTER — Ambulatory Visit: Payer: Self-pay | Admitting: Orthopedic Surgery

## 2015-10-29 NOTE — H&P (Signed)
Deanna Burke DOB: 1950/12/16 Single / Language: Deanna Burke / Race: White Female  H&P Date: 10/28/15  CC: Right knee pain  History of Present Illness The patient is a 65 year old female who comes in today for a preoperative History and Physical. The patient is scheduled for a right total knee arthroplasty to be performed by Dr. Johnn Hai, MD at [facility] on November 04, 2015. Deanna Burke reports chronic pain in the right knee refractory to conservative tx including injections, bracing, medications, activity modifications, strengthening exercises, relative rest. Her pain interferes with quality of life and ADLs at this point and she desires to proceed with surgery. She has been seeing Dr. Tresa Moore and has doxycycline on hand for UTI flareups which she usually takes for 3 days. She noted some cloudiness this morning at the time of her pre-op appt and is noting some urgency. She has had 3 UTI's in the last 12 weeks which have all responded to doxy very well. He does not want to place her on a daily ppx abx. She has an indwelling IVC from her TKA last year which will be in place for 3 months post-op.  Dr. Tonita Cong and the patient mutually agreed to proceed with a total knee replacement. Risks and benefits of the procedure were discussed including stiffness, suboptimal range of motion, persistent pain, infection requiring removal of prosthesis and reinsertion, need for prophylactic antibiotics in the future, for example, dental procedures, possible need for manipulation, revision in the future and also anesthetic complications including DVT, PE, etc. We discussed the perioperative course, time in the hospital, postoperative recovery and the need for elevation to control swelling. We also discussed the predicted range of motion and the probability that squatting and kneeling would be unobtainable in the future. In addition, postoperative anticoagulation was discussed. We have obtained preoperative medical  clearance as necessary. Provided her illustrated handout and discussed it in detail. They will enroll in the total joint replacement educational forum at the hospital.  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)  Shoulder impingement, right (M75.41)  Osteoarthritis of right knee, unspecified osteoarthritis type (M17.11)  Acute pain of right shoulder (M25.511)  Ulcerative Colitis  Anemia  Blood Clot  Breast Cancer  Chronic Cystitis  High blood pressure  Chronic Pain  Osteoarthritis  Sleep Apnea  Kidney Stone  Cataract  Phlebitis  Pulmonary Embolism  Urinary Tract Infection  Kidney Disease  Mumps  Measles   Allergies DEMEROL [01/28/1997]: DILAUDID [01/28/1997]: STADOL [01/28/1997]: KEFLEX [01/28/1997]: PENICILLIN [01/28/1997]: Reglan *GASTROINTESTINAL AGENTS - MISC.*  Allergies Reconciled   Family History  Heart disease in female family member before age 82  Kidney disease  grandmother mothers side First Degree Relatives  Heart disease in female family member before age 99  Cancer  grandmother mothers side and grandmother fathers side Drug / Alcohol Addiction  grandfather mothers side Heart Disease  mother, father, brother and grandfather fathers side  Social History  Tobacco use  never smoker Alcohol use  never consumed alcohol Marital status  divorced Drug/Alcohol Rehab (Currently)  no Exercise  Exercises weekly; does running / walking Children  0 Number of flights of stairs before winded  2-3 Living situation  live alone, one level home. 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 Percocet (5-325MG Tablet, Oral) Active. Robaxin (500MG Tablet, Oral) Active. HydrOXYzine HCl (25MG Tablet, Oral qhs) Active. Bismuth Subgallate (200MG Tablet Chewable, Oral 8 daily) Active. Multivitamin Adult (Oral) Specific strength unknown - Active. Diflucan (100MG Tablet, Oral prn) Active. Oxybutynin Chloride (Oral) Specific strength unknown - Active. Acetaminophen (500MG Tablet, Oral as needed) Active. Diclofenac Sodium (1% Gel, Transdermal as needed) Active. Triamterene-HCTZ (37.5-25MG Tablet, Oral) Active. Aspirin (81MG Tablet, 1 (one) Oral) Active. Vagifem (10MCG Tablet, Vaginal) Active. Potassium Chloride Crys ER (20MEQ Tablet ER, Oral) Active. Tamoxifen Citrate (20MG Tablet, Oral) Active.  Pregnancy / Birth History Pregnant  no  Past Surgical History Oophorectomy  bilateral Rotator Cuff Repair  right Breast Mass; Local Excision  left Cataract Surgery  left Colectomy  complete Appendectomy  Breast Biopsy  left Kidney Removal - Right  Hysterectomy  partial (non-cancerous) Exploratory Laparotomy   Review of Systems General Not Present- Chills, Fatigue, Fever, Memory Loss, Night Sweats, Weight Gain and Weight Loss. Skin Not Present- Eczema, Hives, Itching, Lesions and Rash. HEENT Not Present- Dentures, Double Vision, Headache, Hearing Loss, Tinnitus and Visual Loss. Respiratory Not Present- Allergies, Chronic Cough, Coughing up blood, Shortness of breath at rest and Shortness of breath with exertion. Cardiovascular Not Present- Chest Pain, Difficulty Breathing Lying Down, Murmur, Palpitations, Racing/skipping heartbeats and Swelling. Gastrointestinal Not Present- Abdominal Pain, Bloody Stool, Constipation, Diarrhea, Difficulty Swallowing, Heartburn,  Jaundice, Loss of appetitie, Nausea and Vomiting. Female Genitourinary Not Present- Blood in Urine, Discharge, Flank Pain, Incontinence, Painful Urination, Urgency, Urinary frequency, Urinary Retention, Urinating at Night and Weak urinary stream. Musculoskeletal Present- Back Pain, Joint Pain and Morning Stiffness. Not Present- Joint Swelling, Muscle Pain, Muscle Weakness and Spasms. Neurological Not Present- Blackout spells, Difficulty with balance, Dizziness, Paralysis, Tremor and Weakness. Psychiatric Not Present- Insomnia.  Physical Exam  General Mental Status -Alert, cooperative and good historian. General Appearance-pleasant, Not in acute distress. Orientation-Oriented X3. Build & Nutrition-Well nourished and Well developed.  Head and Neck Head-normocephalic, atraumatic . Neck Global Assessment - supple, no bruit auscultated on the right, no bruit auscultated on the left.  Eye Pupil - Bilateral-Regular and Round. Motion - Bilateral-EOMI.  Chest and Lung Exam Auscultation Breath sounds - clear at anterior chest wall and clear at posterior chest wall. Adventitious sounds - No Adventitious sounds.  Cardiovascular Auscultation Rhythm - Regular rate and rhythm. Heart Sounds - S1 WNL and S2 WNL. Murmurs & Other Heart Sounds - Auscultation of the heart reveals - No Murmurs.  Abdomen Palpation/Percussion Tenderness - Abdomen is non-tender to palpation. Rigidity (guarding) - Abdomen is soft. Auscultation Auscultation of the abdomen reveals - Bowel sounds normal.  Female Genitourinary Not done, not pertinent to present illness  Musculoskeletal Right knee tender medial and lateral joint lines No instability, pain, laxity with varus/valgus stress no calf pain or sign of DVT  Imaging prior xrays with end-stage DJD R knee, slight valgus deformity, erosion of the lateral tibial plateau, severe changes.  Assessment and Plan Primary osteoarthritis of right knee  (M17.11)  Pt with end-stage R knee DJD, bone-on-bone, refractory to conservative tx, scheduled for R total knee replacement by Dr. Tonita Cong on 11/04/15. We again discussed the procedure itself as well as risks, complications and alternatives, including but not limited to DVT, PE, infx, bleeding,  failure of procedure, need for secondary procedure including manipulation, nerve injury, ongoing pain/symptoms, anesthesia risk, even stroke or death. Also discussed typical post-op protocols, activity restrictions, need for PT, flexion/extension exercises, time out of work. Discussed need for DVT ppx post-op with Xarelto then ASA per protocol. Discussed dental ppx. Also discussed limitations post-operatively such as kneeling and squatting. All questions were answered. Patient desires to proceed with surgery as scheduled. Will hold ASA and NSAIDs accordingly. Will remain NPO after MN night before surgery. Will present to [facility] for pre-op testing. Plan Xarelto 3 weeks post-op for DVT ppx (given PE/DVT hx) then ASA, her IVC filter will remain in place 3 months post-op. Plan Percocet, Movantik, Colace. Plan D/C to [facility] then home, would like to start outpt right away post-op this time instead of home PT, will arrange. Will follow up 10-14 days post-op for suture removal and xrays. Her UA was positive for cloudy urine, leukocytes and Hgb. Will start doxy- she was called and advised, she has at home.  Plan right total knee replacement  Signed electronically by Lacie Draft, PA-C for Dr. Tonita Cong

## 2015-11-03 ENCOUNTER — Ambulatory Visit: Payer: Self-pay | Admitting: Orthopedic Surgery

## 2015-11-03 LAB — TYPE AND SCREEN
ABO/RH(D): O POS
ANTIBODY SCREEN: NEGATIVE

## 2015-11-04 ENCOUNTER — Encounter (HOSPITAL_COMMUNITY): Payer: Self-pay | Admitting: *Deleted

## 2015-11-04 ENCOUNTER — Inpatient Hospital Stay (HOSPITAL_COMMUNITY): Payer: Medicare Other | Admitting: Anesthesiology

## 2015-11-04 ENCOUNTER — Inpatient Hospital Stay (HOSPITAL_COMMUNITY): Payer: Medicare Other

## 2015-11-04 ENCOUNTER — Inpatient Hospital Stay (HOSPITAL_COMMUNITY)
Admission: RE | Admit: 2015-11-04 | Discharge: 2015-11-07 | DRG: 470 | Disposition: A | Discharge status: Skilled Nursing Facility | Payer: Medicare Other | Source: Ambulatory Visit | Attending: Specialist | Admitting: Specialist

## 2015-11-04 ENCOUNTER — Encounter (HOSPITAL_COMMUNITY)
Admission: RE | Disposition: A | Discharge status: Skilled Nursing Facility | Payer: Self-pay | Source: Ambulatory Visit | Attending: Specialist

## 2015-11-04 DIAGNOSIS — M21161 Varus deformity, not elsewhere classified, right knee: Secondary | ICD-10-CM | POA: Diagnosis present

## 2015-11-04 DIAGNOSIS — Z79899 Other long term (current) drug therapy: Secondary | ICD-10-CM

## 2015-11-04 DIAGNOSIS — M47816 Spondylosis without myelopathy or radiculopathy, lumbar region: Secondary | ICD-10-CM | POA: Diagnosis present

## 2015-11-04 DIAGNOSIS — Z96652 Presence of left artificial knee joint: Secondary | ICD-10-CM | POA: Diagnosis present

## 2015-11-04 DIAGNOSIS — Z86718 Personal history of other venous thrombosis and embolism: Secondary | ICD-10-CM | POA: Diagnosis not present

## 2015-11-04 DIAGNOSIS — G4730 Sleep apnea, unspecified: Secondary | ICD-10-CM | POA: Diagnosis present

## 2015-11-04 DIAGNOSIS — Z86711 Personal history of pulmonary embolism: Secondary | ICD-10-CM

## 2015-11-04 DIAGNOSIS — I10 Essential (primary) hypertension: Secondary | ICD-10-CM | POA: Diagnosis present

## 2015-11-04 DIAGNOSIS — M1711 Unilateral primary osteoarthritis, right knee: Principal | ICD-10-CM | POA: Diagnosis present

## 2015-11-04 DIAGNOSIS — Z87440 Personal history of urinary (tract) infections: Secondary | ICD-10-CM | POA: Diagnosis not present

## 2015-11-04 DIAGNOSIS — Z7982 Long term (current) use of aspirin: Secondary | ICD-10-CM

## 2015-11-04 DIAGNOSIS — M21061 Valgus deformity, not elsewhere classified, right knee: Secondary | ICD-10-CM | POA: Diagnosis present

## 2015-11-04 DIAGNOSIS — Z9049 Acquired absence of other specified parts of digestive tract: Secondary | ICD-10-CM

## 2015-11-04 DIAGNOSIS — Z932 Ileostomy status: Secondary | ICD-10-CM | POA: Diagnosis not present

## 2015-11-04 DIAGNOSIS — Z923 Personal history of irradiation: Secondary | ICD-10-CM | POA: Diagnosis not present

## 2015-11-04 DIAGNOSIS — Z853 Personal history of malignant neoplasm of breast: Secondary | ICD-10-CM | POA: Diagnosis not present

## 2015-11-04 DIAGNOSIS — Z96659 Presence of unspecified artificial knee joint: Secondary | ICD-10-CM

## 2015-11-04 DIAGNOSIS — N3020 Other chronic cystitis without hematuria: Secondary | ICD-10-CM | POA: Diagnosis present

## 2015-11-04 DIAGNOSIS — M25561 Pain in right knee: Secondary | ICD-10-CM | POA: Diagnosis present

## 2015-11-04 HISTORY — PX: TOTAL KNEE ARTHROPLASTY: SHX125

## 2015-11-04 SURGERY — ARTHROPLASTY, KNEE, TOTAL
Anesthesia: Spinal | Site: Knee | Laterality: Right

## 2015-11-04 MED ORDER — ONDANSETRON HCL 4 MG/2ML IJ SOLN: 4.0000 mg | Freq: Four times a day (QID) | INTRAMUSCULAR | Status: DC | PRN

## 2015-11-04 MED ORDER — LIDOCAINE HCL (CARDIAC) 20 MG/ML IV SOLN
INTRAVENOUS | Status: AC
  Filled 2015-11-04: qty 5

## 2015-11-04 MED ORDER — METOCLOPRAMIDE HCL 5 MG PO TABS: 5.0000 mg | ORAL_TABLET | Freq: Three times a day (TID) | ORAL | Status: DC | PRN

## 2015-11-04 MED ORDER — PROPOFOL 500 MG/50ML IV EMUL
INTRAVENOUS | Status: DC | PRN
  Administered 2015-11-04: 75 ug/kg/min via INTRAVENOUS

## 2015-11-04 MED ORDER — METHOCARBAMOL 500 MG PO TABS
500.0000 mg | ORAL_TABLET | Freq: Four times a day (QID) | ORAL | Status: DC | PRN
Start: 2015-11-04 — End: 2015-11-07
  Administered 2015-11-04 – 2015-11-07 (×6): 500 mg via ORAL
  Filled 2015-11-04 (×6): qty 1

## 2015-11-04 MED ORDER — SODIUM CHLORIDE 0.9 % IR SOLN
Status: DC | PRN
  Administered 2015-11-04: 2000 mL

## 2015-11-04 MED ORDER — PROPOFOL 500 MG/50ML IV EMUL
INTRAVENOUS | Status: DC | PRN
  Administered 2015-11-04: 20 mg via INTRAVENOUS

## 2015-11-04 MED ORDER — MENTHOL 3 MG MT LOZG: 1.0000 | LOZENGE | OROMUCOSAL | Status: DC | PRN

## 2015-11-04 MED ORDER — BISMUTH SUBGALLATE 200 MG PO CHEW: 400.0000 mg | CHEWABLE_TABLET | Freq: Three times a day (TID) | ORAL | Status: DC

## 2015-11-04 MED ORDER — ONDANSETRON HCL 4 MG/2ML IJ SOLN
INTRAMUSCULAR | Status: AC
  Filled 2015-11-04: qty 2

## 2015-11-04 MED ORDER — NON FORMULARY: 200.0000 mg | Freq: Three times a day (TID) | Status: DC

## 2015-11-04 MED ORDER — BISACODYL 5 MG PO TBEC: 5.0000 mg | DELAYED_RELEASE_TABLET | Freq: Every day | ORAL | Status: DC | PRN

## 2015-11-04 MED ORDER — OXYBUTYNIN CHLORIDE 5 MG PO TABS: 5.0000 mg | ORAL_TABLET | Freq: Three times a day (TID) | ORAL | Status: DC | PRN

## 2015-11-04 MED ORDER — TRANEXAMIC ACID 1000 MG/10ML IV SOLN
2000.0000 mg | INTRAVENOUS | Status: DC | PRN
  Administered 2015-11-04: 2000 mg via TOPICAL

## 2015-11-04 MED ORDER — NALOXEGOL OXALATE 12.5 MG PO TABS
12.5000 mg | ORAL_TABLET | Freq: Every day | ORAL | Status: DC
  Administered 2015-11-04 – 2015-11-07 (×4): 12.5 mg via ORAL
  Filled 2015-11-04 (×4): qty 1

## 2015-11-04 MED ORDER — METHOCARBAMOL 1000 MG/10ML IJ SOLN
500.0000 mg | Freq: Four times a day (QID) | INTRAVENOUS | Status: DC | PRN
  Filled 2015-11-04: qty 5

## 2015-11-04 MED ORDER — ACETAMINOPHEN 10 MG/ML IV SOLN
INTRAVENOUS | Status: DC | PRN
  Administered 2015-11-04: 1000 mg via INTRAVENOUS

## 2015-11-04 MED ORDER — PHENOL 1.4 % MT LIQD: 1.0000 | OROMUCOSAL | Status: DC | PRN

## 2015-11-04 MED ORDER — STERILE WATER FOR IRRIGATION IR SOLN
Status: DC | PRN
  Administered 2015-11-04: 2000 mL

## 2015-11-04 MED ORDER — VANCOMYCIN HCL IN DEXTROSE 1-5 GM/200ML-% IV SOLN
1000.0000 mg | Freq: Two times a day (BID) | INTRAVENOUS | Status: AC
  Administered 2015-11-04: 1000 mg via INTRAVENOUS
  Filled 2015-11-04: qty 200

## 2015-11-04 MED ORDER — SODIUM CHLORIDE 0.9 % IJ SOLN
INTRAMUSCULAR | Status: AC
  Filled 2015-11-04: qty 10

## 2015-11-04 MED ORDER — HYDROXYZINE HCL 25 MG PO TABS
25.0000 mg | ORAL_TABLET | Freq: Every day | ORAL | Status: DC
  Administered 2015-11-04 – 2015-11-06 (×3): 25 mg via ORAL
  Filled 2015-11-04 (×3): qty 1

## 2015-11-04 MED ORDER — RIVAROXABAN 10 MG PO TABS
10.0000 mg | ORAL_TABLET | Freq: Every day | ORAL | Status: DC
Start: 2015-11-05 — End: 2015-11-07
  Administered 2015-11-05 – 2015-11-07 (×3): 10 mg via ORAL
  Filled 2015-11-04 (×3): qty 1

## 2015-11-04 MED ORDER — FENTANYL CITRATE (PF) 100 MCG/2ML IJ SOLN: 25.0000 ug | INTRAMUSCULAR | Status: DC | PRN

## 2015-11-04 MED ORDER — SODIUM CHLORIDE 0.9 % IR SOLN
Status: AC
  Filled 2015-11-04: qty 1

## 2015-11-04 MED ORDER — POTASSIUM CHLORIDE CRYS ER 20 MEQ PO TBCR
20.0000 meq | EXTENDED_RELEASE_TABLET | Freq: Every day | ORAL | Status: DC
  Administered 2015-11-05 – 2015-11-07 (×3): 20 meq via ORAL
  Filled 2015-11-04 (×3): qty 1

## 2015-11-04 MED ORDER — METOCLOPRAMIDE HCL 5 MG/ML IJ SOLN: 5.0000 mg | Freq: Three times a day (TID) | INTRAMUSCULAR | Status: DC | PRN

## 2015-11-04 MED ORDER — DIPHENHYDRAMINE HCL 12.5 MG/5ML PO ELIX: 12.5000 mg | ORAL_SOLUTION | ORAL | Status: DC | PRN

## 2015-11-04 MED ORDER — KCL IN DEXTROSE-NACL 20-5-0.45 MEQ/L-%-% IV SOLN
INTRAVENOUS | Status: AC
  Administered 2015-11-04: 12:00:00 via INTRAVENOUS
  Filled 2015-11-04 (×2): qty 1000

## 2015-11-04 MED ORDER — VANCOMYCIN HCL IN DEXTROSE 1-5 GM/200ML-% IV SOLN
1000.0000 mg | INTRAVENOUS | Status: AC
  Administered 2015-11-04: 1000 mg via INTRAVENOUS
  Filled 2015-11-04: qty 200

## 2015-11-04 MED ORDER — ACETAMINOPHEN 650 MG RE SUPP: 650.0000 mg | Freq: Four times a day (QID) | RECTAL | Status: DC | PRN

## 2015-11-04 MED ORDER — OXYCODONE-ACETAMINOPHEN 5-325 MG PO TABS: 1.0000 | ORAL_TABLET | ORAL | Status: DC | PRN

## 2015-11-04 MED ORDER — ACETAMINOPHEN 325 MG PO TABS: 650.0000 mg | ORAL_TABLET | Freq: Four times a day (QID) | ORAL | Status: DC | PRN

## 2015-11-04 MED ORDER — ONDANSETRON HCL 4 MG/2ML IJ SOLN
INTRAMUSCULAR | Status: DC | PRN
Start: 2015-11-04 — End: 2015-11-04
  Administered 2015-11-04: 4 mg via INTRAVENOUS

## 2015-11-04 MED ORDER — PHENYLEPHRINE 40 MCG/ML (10ML) SYRINGE FOR IV PUSH (FOR BLOOD PRESSURE SUPPORT)
PREFILLED_SYRINGE | INTRAVENOUS | Status: AC
  Filled 2015-11-04: qty 10

## 2015-11-04 MED ORDER — MORPHINE SULFATE (PF) 2 MG/ML IV SOLN
2.0000 mg | INTRAVENOUS | Status: DC | PRN
  Administered 2015-11-04 – 2015-11-05 (×5): 2 mg via INTRAVENOUS
  Filled 2015-11-04 (×5): qty 1

## 2015-11-04 MED ORDER — MIDAZOLAM HCL 2 MG/2ML IJ SOLN
INTRAMUSCULAR | Status: AC
  Filled 2015-11-04: qty 2

## 2015-11-04 MED ORDER — PROPOFOL 10 MG/ML IV BOLUS
INTRAVENOUS | Status: AC
  Filled 2015-11-04: qty 20

## 2015-11-04 MED ORDER — BUPIVACAINE HCL (PF) 0.5 % IJ SOLN
INTRAMUSCULAR | Status: DC | PRN
  Administered 2015-11-04: 3 mL

## 2015-11-04 MED ORDER — BISMUTH SUBGALLATE 200 MG PO CHEW
400.0000 mg | CHEWABLE_TABLET | Freq: Three times a day (TID) | ORAL | Status: DC
  Administered 2015-11-04 – 2015-11-07 (×12): 400 mg via ORAL

## 2015-11-04 MED ORDER — BUPIVACAINE-EPINEPHRINE 0.25% -1:200000 IJ SOLN
INTRAMUSCULAR | Status: DC | PRN
  Administered 2015-11-04: 50 mL

## 2015-11-04 MED ORDER — EPHEDRINE SULFATE 50 MG/ML IJ SOLN
INTRAMUSCULAR | Status: AC
  Filled 2015-11-04: qty 1

## 2015-11-04 MED ORDER — ACETAMINOPHEN 10 MG/ML IV SOLN
INTRAVENOUS | Status: AC
  Filled 2015-11-04: qty 100

## 2015-11-04 MED ORDER — TAMOXIFEN CITRATE 10 MG PO TABS
20.0000 mg | ORAL_TABLET | Freq: Every day | ORAL | Status: DC
  Administered 2015-11-05 – 2015-11-07 (×3): 20 mg via ORAL
  Filled 2015-11-04 (×4): qty 2

## 2015-11-04 MED ORDER — DOCUSATE SODIUM 100 MG PO CAPS
100.0000 mg | ORAL_CAPSULE | Freq: Two times a day (BID) | ORAL | Status: DC
  Administered 2015-11-04 – 2015-11-07 (×6): 100 mg via ORAL
  Filled 2015-11-04 (×6): qty 1

## 2015-11-04 MED ORDER — ALUM & MAG HYDROXIDE-SIMETH 200-200-20 MG/5ML PO SUSP: 30.0000 mL | ORAL | Status: DC | PRN

## 2015-11-04 MED ORDER — BUPIVACAINE HCL (PF) 0.5 % IJ SOLN
INTRAMUSCULAR | Status: AC
  Filled 2015-11-04: qty 30

## 2015-11-04 MED ORDER — TRIAMTERENE-HCTZ 37.5-25 MG PO CAPS
1.0000 | ORAL_CAPSULE | Freq: Every morning | ORAL | Status: DC
  Administered 2015-11-05 – 2015-11-07 (×3): 1 via ORAL
  Filled 2015-11-04 (×3): qty 1

## 2015-11-04 MED ORDER — POLYETHYLENE GLYCOL 3350 17 G PO PACK: 17.0000 g | PACK | Freq: Every day | ORAL | Status: DC

## 2015-11-04 MED ORDER — RIVAROXABAN 10 MG PO TABS: 10.0000 mg | ORAL_TABLET | Freq: Every day | ORAL | Status: DC

## 2015-11-04 MED ORDER — SODIUM CHLORIDE 0.9 % IR SOLN: Status: DC | PRN

## 2015-11-04 MED ORDER — DEXAMETHASONE SODIUM PHOSPHATE 10 MG/ML IJ SOLN
INTRAMUSCULAR | Status: AC
  Filled 2015-11-04: qty 1

## 2015-11-04 MED ORDER — POLYETHYLENE GLYCOL 3350 17 G PO PACK: 17.0000 g | PACK | Freq: Every day | ORAL | Status: DC | PRN

## 2015-11-04 MED ORDER — LACTATED RINGERS IV SOLN
INTRAVENOUS | Status: DC | PRN
  Administered 2015-11-04 (×2): via INTRAVENOUS

## 2015-11-04 MED ORDER — SODIUM CHLORIDE 0.9 % IR SOLN
Status: DC | PRN
  Administered 2015-11-04: 500 mL

## 2015-11-04 MED ORDER — MAGNESIUM CITRATE PO SOLN: 1.0000 | Freq: Once | ORAL | Status: DC | PRN

## 2015-11-04 MED ORDER — BUPIVACAINE-EPINEPHRINE 0.25% -1:200000 IJ SOLN
INTRAMUSCULAR | Status: AC
  Filled 2015-11-04: qty 1

## 2015-11-04 MED ORDER — TRANEXAMIC ACID 1000 MG/10ML IV SOLN
2000.0000 mg | INTRAVENOUS | Status: AC
  Filled 2015-11-04: qty 20

## 2015-11-04 MED ORDER — ADULT MULTIVITAMIN W/MINERALS CH
ORAL_TABLET | Freq: Every day | ORAL | Status: DC
Start: 2015-11-05 — End: 2015-11-07
  Administered 2015-11-05 – 2015-11-07 (×3): 1 via ORAL
  Filled 2015-11-04 (×3): qty 1

## 2015-11-04 MED ORDER — ONDANSETRON HCL 4 MG PO TABS: 4.0000 mg | ORAL_TABLET | Freq: Four times a day (QID) | ORAL | Status: DC | PRN

## 2015-11-04 MED ORDER — DOCUSATE SODIUM 100 MG PO CAPS: 100.0000 mg | ORAL_CAPSULE | Freq: Two times a day (BID) | ORAL | Status: DC | PRN

## 2015-11-04 MED ORDER — OXYCODONE HCL 5 MG PO TABS
5.0000 mg | ORAL_TABLET | ORAL | Status: DC | PRN
  Administered 2015-11-04 – 2015-11-07 (×15): 10 mg via ORAL
  Filled 2015-11-04 (×15): qty 2

## 2015-11-04 MED ORDER — DEXAMETHASONE SODIUM PHOSPHATE 10 MG/ML IJ SOLN
INTRAMUSCULAR | Status: DC | PRN
  Administered 2015-11-04: 10 mg via INTRAVENOUS

## 2015-11-04 MED ORDER — BISMUTH SUBGALLATE 200 MG PO CHEW: 200.0000 mg | CHEWABLE_TABLET | Freq: Three times a day (TID) | ORAL | Status: DC

## 2015-11-04 MED ORDER — MIDAZOLAM HCL 5 MG/5ML IJ SOLN
INTRAMUSCULAR | Status: DC | PRN
  Administered 2015-11-04: 2 mg via INTRAVENOUS

## 2015-11-04 MED ORDER — RISAQUAD PO CAPS
1.0000 | ORAL_CAPSULE | Freq: Every day | ORAL | Status: DC
  Administered 2015-11-04 – 2015-11-07 (×4): 1 via ORAL
  Filled 2015-11-04 (×4): qty 1

## 2015-11-04 MED ORDER — PHENYLEPHRINE HCL 10 MG/ML IJ SOLN
INTRAMUSCULAR | Status: DC | PRN
  Administered 2015-11-04 (×2): 40 ug via INTRAVENOUS

## 2015-11-04 SURGICAL SUPPLY — 63 items
BAG DECANTER FOR FLEXI CONT (MISCELLANEOUS) ×2 IMPLANT
BAG SPEC THK2 15X12 ZIP CLS (MISCELLANEOUS) ×1
BAG ZIPLOCK 12X15 (MISCELLANEOUS) ×2 IMPLANT
BANDAGE ACE 4X5 VEL STRL LF (GAUZE/BANDAGES/DRESSINGS) ×3 IMPLANT
BANDAGE ACE 6X5 VEL STRL LF (GAUZE/BANDAGES/DRESSINGS) ×3 IMPLANT
BLADE SAG 18X100X1.27 (BLADE) ×3 IMPLANT
BLADE SAW SGTL 13.0X1.19X90.0M (BLADE) ×3 IMPLANT
CAP KNEE TOTAL 3 SIGMA ×2 IMPLANT
CEMENT HV SMART SET (Cement) ×6 IMPLANT
CLOSURE WOUND 1/2 X4 (GAUZE/BANDAGES/DRESSINGS) ×1
CLOTH 2% CHLOROHEXIDINE 3PK (PERSONAL CARE ITEMS) ×3 IMPLANT
CUFF TOURN SGL QUICK 34 (TOURNIQUET CUFF) ×3
CUFF TRNQT CYL 34X4X40X1 (TOURNIQUET CUFF) ×1 IMPLANT
DECANTER SPIKE VIAL GLASS SM (MISCELLANEOUS) ×3 IMPLANT
DRAPE INCISE IOBAN 66X45 STRL (DRAPES) IMPLANT
DRAPE LG THREE QUARTER DISP (DRAPES) ×2 IMPLANT
DRAPE ORTHO SPLIT 77X108 STRL (DRAPES) ×6
DRAPE SHEET LG 3/4 BI-LAMINATE (DRAPES) ×2 IMPLANT
DRAPE SURG ORHT 6 SPLT 77X108 (DRAPES) ×2 IMPLANT
DRAPE U-SHAPE 47X51 STRL (DRAPES) ×3 IMPLANT
DRESSING AQUACEL AG SP 3.5X10 (GAUZE/BANDAGES/DRESSINGS) IMPLANT
DRSG AQUACEL AG SP 3.5X10 (GAUZE/BANDAGES/DRESSINGS) ×3
DRSG TEGADERM 4X4.75 (GAUZE/BANDAGES/DRESSINGS) IMPLANT
DURAPREP 26ML APPLICATOR (WOUND CARE) ×3 IMPLANT
ELECT REM PT RETURN 9FT ADLT (ELECTROSURGICAL) ×3
ELECTRODE REM PT RTRN 9FT ADLT (ELECTROSURGICAL) ×1 IMPLANT
EVACUATOR 1/8 PVC DRAIN (DRAIN) IMPLANT
GAUZE SPONGE 2X2 8PLY STRL LF (GAUZE/BANDAGES/DRESSINGS) IMPLANT
GLOVE BIOGEL PI IND STRL 7.0 (GLOVE) ×1 IMPLANT
GLOVE BIOGEL PI IND STRL 7.5 (GLOVE) IMPLANT
GLOVE BIOGEL PI IND STRL 8 (GLOVE) ×1 IMPLANT
GLOVE BIOGEL PI INDICATOR 7.0 (GLOVE) ×4
GLOVE BIOGEL PI INDICATOR 7.5 (GLOVE) ×6
GLOVE BIOGEL PI INDICATOR 8 (GLOVE) ×4
GLOVE SURG SS PI 7.0 STRL IVOR (GLOVE) ×5 IMPLANT
GLOVE SURG SS PI 7.5 STRL IVOR (GLOVE) ×5 IMPLANT
GLOVE SURG SS PI 8.0 STRL IVOR (GLOVE) ×8 IMPLANT
GOWN STRL REUS W/TWL XL LVL3 (GOWN DISPOSABLE) ×6 IMPLANT
HANDPIECE INTERPULSE COAX TIP (DISPOSABLE) ×3
IMMOBILIZER KNEE 20 (SOFTGOODS) ×3
IMMOBILIZER KNEE 20 THIGH 36 (SOFTGOODS) ×1 IMPLANT
MANIFOLD NEPTUNE II (INSTRUMENTS) ×3 IMPLANT
PACK TOTAL KNEE CUSTOM (KITS) ×3 IMPLANT
POSITIONER SURGICAL ARM (MISCELLANEOUS) ×3 IMPLANT
SET HNDPC FAN SPRY TIP SCT (DISPOSABLE) ×1 IMPLANT
SPONGE GAUZE 2X2 STER 10/PKG (GAUZE/BANDAGES/DRESSINGS)
SPONGE SURGIFOAM ABS GEL 100 (HEMOSTASIS) ×2 IMPLANT
STAPLER VISISTAT (STAPLE) IMPLANT
STRIP CLOSURE SKIN 1/2X4 (GAUZE/BANDAGES/DRESSINGS) ×1 IMPLANT
SUT BONE WAX W31G (SUTURE) ×2 IMPLANT
SUT MNCRL AB 4-0 PS2 18 (SUTURE) IMPLANT
SUT STRATAFIX 0 PDS 27 VIOLET (SUTURE) ×3
SUT VIC AB 1 CT1 27 (SUTURE) ×6
SUT VIC AB 1 CT1 27XBRD ANTBC (SUTURE) ×2 IMPLANT
SUT VIC AB 2-0 CT1 27 (SUTURE) ×9
SUT VIC AB 2-0 CT1 TAPERPNT 27 (SUTURE) ×3 IMPLANT
SUT VLOC 180 0 24IN GS25 (SUTURE) ×3 IMPLANT
SUTURE STRATFX 0 PDS 27 VIOLET (SUTURE) IMPLANT
SYR 50ML LL SCALE MARK (SYRINGE) ×5 IMPLANT
TOWER CARTRIDGE SMART MIX (DISPOSABLE) ×3 IMPLANT
TRAY FOLEY W/METER SILVER 14FR (SET/KITS/TRAYS/PACK) ×3 IMPLANT
WRAP KNEE MAXI GEL POST OP (GAUZE/BANDAGES/DRESSINGS) ×3 IMPLANT
YANKAUER SUCT BULB TIP 10FT TU (MISCELLANEOUS) ×3 IMPLANT

## 2015-11-04 NOTE — NC FL2 (Signed)
[place] MEDICAID FL2 LEVEL OF CARE SCREENING TOOL     IDENTIFICATION  Patient Name: Deanna Burke Birthdate: 07-18-1950 Sex: female Admission Date (Current Location): 11/04/2015  [facility] and [place] Number:  Herbalist and Address:  [facility],  [place], [place]      Provider Number: [id number]  Attending Physician Name and Address:  Susa Day, MD  Relative Name and Phone Number:       Current Level of Care: Hospital Recommended Level of Care: [place] Prior Approval Number:    Date Approved/Denied:   PASRR Number: [id number]  Discharge Plan: SNF    Current Diagnoses: Patient Active Problem List   Diagnosis Date Noted  . Primary osteoarthritis of right knee 11/04/2015  . Right knee DJD 11/04/2015  . Ulcerative colitis ([place]) 05/10/2015  . 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 H/O unilateral nephrectomy 05/10/2015  . Essential hypertension, benign 04/07/2015  . Left knee DJD 04/01/2015  . History of pulmonary embolism   . SBO (small bowel obstruction) ([place]) 12/30/2013  . Breast cancer of upper-outer quadrant of left female breast ([place]) 04/07/2013  . Neoplasm of left breast, primary tumor staging category Tis: ductal carcinoma in situ (DCIS) 01/27/2013  . Pyelonephritis, acute 06/05/2012  . Hydronephrosis of right kidney 06/05/2012  . Hyponatremia 06/05/2012  . Hypokalemia 06/05/2012  . Acute kidney injury ([place]) 06/05/2012  . 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 Abnormal finding on cardiovascular stress test, ischemia anterolateral 02/13/2012  . Syncope, ? anginal equivilant 02/13/2012  . Ileostomy in place, secondary to ulcerative colitis x 20 years 02/13/2012  . History of recurrent UTIs 02/13/2012    Orientation RESPIRATION BLADDER Height & Weight     Self, Time, Situation, Place  Normal, Other (Comment) (CPAP at night) Indwelling catheter  Weight: 77.565 kg (171 lb) Height:  5' 4"  (162.6 cm)  BEHAVIORAL SYMPTOMS/MOOD NEUROLOGICAL BOWEL NUTRITION STATUS  Other (Comment) (No Behaviors)   Continent Diet  AMBULATORY STATUS COMMUNICATION OF NEEDS Skin   Extensive Assist Verbally Surgical wounds                       Personal Care Assistance Level of Assistance  Bathing, Feeding, Dressing Bathing Assistance: Limited assistance Feeding assistance: Independent Dressing Assistance: Limited assistance     Functional Limitations Info  Sight, Hearing, Speech Sight Info: Adequate Hearing Info: Adequate Speech Info: Adequate    SPECIAL CARE FACTORS FREQUENCY  PT (By licensed PT), OT (By licensed OT)     PT Frequency: 5 x wk OT Frequency: 5 x wk            Contractures Contractures Info: Not present    Additional Factors Info  Code Status Code Status Info: Full Code             Current Medications (11/04/2015):  This is the current hospital active medication list Current Facility-Administered Medications  Medication Dose Route Frequency Provider Last Rate Last Dose  . acetaminophen (TYLENOL) tablet 650 mg  650 mg Oral Q6H PRN Susa Day, MD       Or  . acetaminophen (TYLENOL) suppository 650 mg  650 mg Rectal Q6H PRN Susa Day, MD      . acidophilus (RISAQUAD) capsule 1 capsule  1 capsule Oral Daily Susa Day, MD      . alum & mag hydroxide-simeth (MAALOX/MYLANTA) 200-200-20 MG/5ML suspension 30 mL  30 mL Oral  Q4H PRN Susa Day, MD      . bisacodyl (DULCOLAX) EC tablet 5 mg  5 mg Oral Daily PRN Susa Day, MD      . dextrose 5 % and 0.45 % NaCl with KCl 20 mEq/L infusion   Intravenous Continuous Susa Day, MD 50 mL/hr at 11/04/15 1225    . diphenhydrAMINE (BENADRYL) 12.5 MG/5ML elixir 12.5-25 mg  12.5-25 mg Oral Q4H PRN Susa Day, MD      . docusate sodium (COLACE) capsule 100 mg  100 mg Oral BID Susa Day, MD      . hydrOXYzine (ATARAX/VISTARIL) tablet 25 mg  25 mg Oral QHS  Susa Day, MD      . magnesium citrate solution 1 Bottle  1 Bottle Oral Once PRN Susa Day, MD      . menthol-cetylpyridinium (CEPACOL) lozenge 3 mg  1 lozenge Oral PRN Susa Day, MD       Or  . phenol (CHLORASEPTIC) mouth spray 1 spray  1 spray Mouth/Throat PRN Susa Day, MD      . methocarbamol (ROBAXIN) tablet 500 mg  500 mg Oral Q6H PRN Susa Day, MD       Or  . methocarbamol (ROBAXIN) 500 mg in dextrose 5 % 50 mL IVPB  500 mg Intravenous Q6H PRN Susa Day, MD      . metoCLOPramide (REGLAN) tablet 5-10 mg  5-10 mg Oral Q8H PRN Susa Day, MD       Or  . metoCLOPramide (REGLAN) injection 5-10 mg  5-10 mg Intravenous Q8H PRN Susa Day, MD      . morphine 2 MG/ML injection 2 mg  2 mg Intravenous Q2H PRN Susa Day, MD   2 mg at 11/04/15 1313  . [START ON 11/05/2015] multivitamin with minerals tablet   Oral Daily Susa Day, MD      . naloxegol oxalate (MOVANTIK) tablet 12.5 mg  12.5 mg Oral Daily Susa Day, MD      . ondansetron [facility]) tablet 4 mg  4 mg Oral Q6H PRN Susa Day, MD       Or  . ondansetron [facility]) injection 4 mg  4 mg Intravenous Q6H PRN Susa Day, MD      . oxybutynin (DITROPAN) tablet 5 mg  5 mg Oral Q8H PRN Susa Day, MD      . oxyCODONE (Oxy IR/ROXICODONE) immediate release tablet 5-10 mg  5-10 mg Oral Q3H PRN Susa Day, MD   10 mg at 11/04/15 1230  . polyethylene glycol (MIRALAX / GLYCOLAX) packet 17 g  17 g Oral Daily PRN Susa Day, MD      . Derrill Memo ON 11/05/2015] potassium chloride SA (K-DUR,KLOR-CON) CR tablet 20 mEq  20 mEq Oral Daily Susa Day, MD      . Derrill Memo ON 11/05/2015] rivaroxaban (XARELTO) tablet 10 mg  10 mg Oral Q breakfast Susa Day, MD      . Derrill Memo ON 11/05/2015] tamoxifen (NOLVADEX) tablet 20 mg  20 mg Oral Daily Susa Day, MD      . tranexamic acid (CYKLOKAPRON) 2,000 mg in sodium chloride 0.9 % 50 mL Topical Application  9,562 mg Topical On Call to [place], PA-C      .  [START ON 11/05/2015] triamterene-hydrochlorothiazide (DYAZIDE) 37.5-25 MG per capsule 1 capsule  1 capsule Oral q morning - 10a Susa Day, MD      . vancomycin (VANCOCIN) IVPB 1000 mg/200 mL premix  1,000 mg Intravenous Q12H Susa Day, MD  Discharge Medications: Please see discharge summary for a list of discharge medications.  Relevant Imaging Results:  Relevant Lab Results:   Additional Information SS # [id number]  Brookelyn Gaynor, Randall An, LCSW

## 2015-11-04 NOTE — Op Note (Signed)
NAMEONA, ROEHRS NO.:  [id number]  MEDICAL RECORD NO.:  [id number]  LOCATION:  62                         FACILITY:  [facility]  PHYSICIAN:  Susa Day, M.D.    DATE OF BIRTH:  02-Mar-1951  DATE OF PROCEDURE:  11/04/2015 DATE OF DISCHARGE:                              OPERATIVE REPORT   PREOPERATIVE DIAGNOSIS:  End-stage osteoarthritis of the right knee with varus deformity.  POSTOPERATIVE DIAGNOSIS:  End-stage osteoarthritis of the right knee with varus deformity.  PROCEDURE PERFORMED:  Right total knee arthroplasty.  Complements; DePuy ATTUNE, 5 femur, 5 tibia, 5 insert, 35 patella.  ANESTHESIA:  Spinal.  ASSISTANT:  Cleophas Dunker, PA.  HISTORY:  A 64, end-stage osteoarthrosis in the knee, varus deformity, refractory to conservative treatment and bone-on-bone arthrosis, indicated for replacement of degenerative joint, failing conservative treatment including injections, activity modification, viscosupplementation, significant negative effect to her activities of daily living.  Risk and benefits were discussed, bleeding, infection, damage to neurovascular structures, suboptimal range of motion, need for revision, DVT, PE, anesthetic complications, etc.  TECHNIQUE:  With the patient in supine position, after induction of adequate general anesthesia, 1 g of vanco, right lower extremity was prepped and draped and exsanguinated in usual sterile fashion.  Thigh tourniquet inflated to 275 mmHg.  Midline incision was made over the knee.  Subcutaneous tissue was dissected.  Electrocautery was utilized to achieve hemostasis.  Full-thickness flaps developed.  Median parapatellar arthrotomy performed.  Patella everted and knee flexed. Copious portions, clear synovial fluid evacuated.  Tricompartmental bone- on-bone arthrosis noted.  Multiple osteophytes removed with a rongeur. Remnants of medial and lateral menisci were removed.  Minimal soft tissue  elevation medially.  Step drill utilized to enter the femoral canal, was irrigated, 5-degree right was utilized with 10 off the distal femur due to a contracture.  This was then pinned.  Oscillating saw performed with the cut.  We then subluxed the tibia with a [place] carefully, external alignment guide, 2 off the defect, which was posterolaterally, parallel with the shaft, 3 degrees slope in line with the second ray, this was then pinned and oscillating saw performed this cut.  We then checked our extension gap, which was satisfactory at a 5. We then turned our attention back towards the femur, measured off the anterior cortex to a 5, 3 degrees of external rotation.  This was then pinned.  Then, we placed our block cut on that, this was off the anterior cortex.  We then performed our anterior, posterior and chamfer cuts following this.  I then placed a box cut guide there just to flush with lateral condyle.  This was then pinned.  The box cut performed.  We turned our attention towards completing the tibia, was sized to a 5 just to the medial aspect of the tibial tubercle, maximizing the coverage. This was then pinned.  We centrally drilled and used our punch guide. We then placed a trial femur and tibia with a 5-mm insert, reduced it. We had full extension, full flexion and good stability with varus and valgus stressing 0-30 degrees.  Negative patellofemoral tracking. Negative anterior drawer.  We drilled our lug holes and then attention turned towards  the patella, measured at a 23, planed to a 15 with the external patellar guide.  We then drilled 3 PEG holes after measuring it to a 35, medializing those.  A trial patella was placed and we reduced it and had excellent patellofemoral tracking.  All components were removed.  We checked posteriorly and removed remnants of the menisci, cauterized the geniculate, popliteus was intact.  Copiously irrigated the wound.  There was an osteophyte  posterolaterally that was fixed to the soft tissues and therefore, this was left.  We copiously irrigated with pulsatile lavage, flexed the knee, subluxed the tibia.  All surfaces were thoroughly dried, mixed the cement on back table in the appropriate fashion and then injected into the tibial canal digitally pressurizing it.  We impacted the 5 tibial tray and then impacted and cemented the femoral component, redundant cement removed.  Five trial insert placed, reduced with an axial load throughout the curing of the cement, cemented the patella as well with a clamp.  After curing of the cement and 0.25% Marcaine with epinephrine, infiltrating the periarticular tissues and bone wax on the free cancellous surfaces.  We flexed the knee, removed the insert, meticulously removed all redundant cement and copiously irrigated with pulsatile lavage and antibiotic irrigation, chose a 5 insert, rotating platform, reduced it, and had full extension, full flexion and good stability, varus and valgus stressing 0-30 degrees.  Tourniquet was deflated at 48 minutes.  Good revascularization of the lower extremity appreciated.  We cauterized any bleeding tissue.  We had excellent patellofemoral tracking.  With slight flexion, we reapproximated the patellar arthrotomy with 1 Vicryl and then running STRATAFIX and we injected into the joint TXA.  Copiously irrigated the wound once again.  Subcu with 2-0 and skin with Monocryl, excellent patellofemoral tracking, flexion to gravity at 90 degrees. Negative anterior drawer.  Good stability, varus and valgus stressing 0- 30 degrees.  Sterile dressing applied.  Placed in immobilizer and transported to the recovery room in satisfactory condition.  The patient tolerated the procedure well.  No complications.  Assistant, Cleophas Dunker, [place].  Minimal blood loss.  Cleophas Dunker, PA was used throughout the case, patient positioning, retraction, exposure  and closure.     Susa Day, M.D.     Geralynn Rile  D:  11/04/2015  T:  11/04/2015  Job:  [id number]

## 2015-11-04 NOTE — H&P (View-Only) (Signed)
Deanna Burke DOB: 10-29-1950 Single / Language: Deanna Burke / Race: White Female  H&P Date: 10/28/15  CC: Right knee pain  History of Present Illness The patient is a 65 year old female who comes in today for a preoperative History and Physical. The patient is scheduled for a right total knee arthroplasty to be performed by Dr. Johnn Hai, MD at [facility] on November 04, 2015. Tess reports chronic pain in the right knee refractory to conservative tx including injections, bracing, medications, activity modifications, strengthening exercises, relative rest. Her pain interferes with quality of life and ADLs at this point and she desires to proceed with surgery. She has been seeing Dr. Tresa Moore and has doxycycline on hand for UTI flareups which she usually takes for 3 days. She noted some cloudiness this morning at the time of her pre-op appt and is noting some urgency. She has had 3 UTI's in the last 12 weeks which have all responded to doxy very well. He does not want to place her on a daily ppx abx. She has an indwelling IVC from her TKA last year which will be in place for 3 months post-op.  Dr. Tonita Cong and the patient mutually agreed to proceed with a total knee replacement. Risks and benefits of the procedure were discussed including stiffness, suboptimal range of motion, persistent pain, infection requiring removal of prosthesis and reinsertion, need for prophylactic antibiotics in the future, for example, dental procedures, possible need for manipulation, revision in the future and also anesthetic complications including DVT, PE, etc. We discussed the perioperative course, time in the hospital, postoperative recovery and the need for elevation to control swelling. We also discussed the predicted range of motion and the probability that squatting and kneeling would be unobtainable in the future. In addition, postoperative anticoagulation was discussed. We have obtained preoperative medical  clearance as necessary. Provided her illustrated handout and discussed it in detail. They will enroll in the total joint replacement educational forum at the hospital.  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)  Shoulder impingement, right (M75.41)  Osteoarthritis of right knee, unspecified osteoarthritis type (M17.11)  Acute pain of right shoulder (M25.511)  Ulcerative Colitis  Anemia  Blood Clot  Breast Cancer  Chronic Cystitis  High blood pressure  Chronic Pain  Osteoarthritis  Sleep Apnea  Kidney Stone  Cataract  Phlebitis  Pulmonary Embolism  Urinary Tract Infection  Kidney Disease  Mumps  Measles   Allergies DEMEROL [01/28/1997]: DILAUDID [01/28/1997]: STADOL [01/28/1997]: KEFLEX [01/28/1997]: PENICILLIN [01/28/1997]: Reglan *GASTROINTESTINAL AGENTS - MISC.*  Allergies Reconciled   Family History  Heart disease in female family member before age 25  Kidney disease  grandmother mothers side First Degree Relatives  Heart disease in female family member before age 73  Cancer  grandmother mothers side and grandmother fathers side Drug / Alcohol Addiction  grandfather mothers side Heart Disease  mother, father, brother and grandfather fathers side  Social History  Tobacco use  never smoker Alcohol use  never consumed alcohol Marital status  divorced Drug/Alcohol Rehab (Currently)  no Exercise  Exercises weekly; does running / walking Children  0 Number of flights of stairs before winded  2-3 Living situation  live alone, one level home. 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 Percocet (5-325MG Tablet, Oral) Active. Robaxin (500MG Tablet, Oral) Active. HydrOXYzine HCl (25MG Tablet, Oral qhs) Active. Bismuth Subgallate (200MG Tablet Chewable, Oral 8 daily) Active. Multivitamin Adult (Oral) Specific strength unknown - Active. Diflucan (100MG Tablet, Oral prn) Active. Oxybutynin Chloride (Oral) Specific strength unknown - Active. Acetaminophen (500MG Tablet, Oral as needed) Active. Diclofenac Sodium (1% Gel, Transdermal as needed) Active. Triamterene-HCTZ (37.5-25MG Tablet, Oral) Active. Aspirin (81MG Tablet, 1 (one) Oral) Active. Vagifem (10MCG Tablet, Vaginal) Active. Potassium Chloride Crys ER (20MEQ Tablet ER, Oral) Active. Tamoxifen Citrate (20MG Tablet, Oral) Active.  Pregnancy / Birth History Pregnant  no  Past Surgical History Oophorectomy  bilateral Rotator Cuff Repair  right Breast Mass; Local Excision  left Cataract Surgery  left Colectomy  complete Appendectomy  Breast Biopsy  left Kidney Removal - Right  Hysterectomy  partial (non-cancerous) Exploratory Laparotomy   Review of Systems General Not Present- Chills, Fatigue, Fever, Memory Loss, Night Sweats, Weight Gain and Weight Loss. Skin Not Present- Eczema, Hives, Itching, Lesions and Rash. HEENT Not Present- Dentures, Double Vision, Headache, Hearing Loss, Tinnitus and Visual Loss. Respiratory Not Present- Allergies, Chronic Cough, Coughing up blood, Shortness of breath at rest and Shortness of breath with exertion. Cardiovascular Not Present- Chest Pain, Difficulty Breathing Lying Down, Murmur, Palpitations, Racing/skipping heartbeats and Swelling. Gastrointestinal Not Present- Abdominal Pain, Bloody Stool, Constipation, Diarrhea, Difficulty Swallowing, Heartburn,  Jaundice, Loss of appetitie, Nausea and Vomiting. Female Genitourinary Not Present- Blood in Urine, Discharge, Flank Pain, Incontinence, Painful Urination, Urgency, Urinary frequency, Urinary Retention, Urinating at Night and Weak urinary stream. Musculoskeletal Present- Back Pain, Joint Pain and Morning Stiffness. Not Present- Joint Swelling, Muscle Pain, Muscle Weakness and Spasms. Neurological Not Present- Blackout spells, Difficulty with balance, Dizziness, Paralysis, Tremor and Weakness. Psychiatric Not Present- Insomnia.  Physical Exam  General Mental Status -Alert, cooperative and good historian. General Appearance-pleasant, Not in acute distress. Orientation-Oriented X3. Build & Nutrition-Well nourished and Well developed.  Head and Neck Head-normocephalic, atraumatic . Neck Global Assessment - supple, no bruit auscultated on the right, no bruit auscultated on the left.  Eye Pupil - Bilateral-Regular and Round. Motion - Bilateral-EOMI.  Chest and Lung Exam Auscultation Breath sounds - clear at anterior chest wall and clear at posterior chest wall. Adventitious sounds - No Adventitious sounds.  Cardiovascular Auscultation Rhythm - Regular rate and rhythm. Heart Sounds - S1 WNL and S2 WNL. Murmurs & Other Heart Sounds - Auscultation of the heart reveals - No Murmurs.  Abdomen Palpation/Percussion Tenderness - Abdomen is non-tender to palpation. Rigidity (guarding) - Abdomen is soft. Auscultation Auscultation of the abdomen reveals - Bowel sounds normal.  Female Genitourinary Not done, not pertinent to present illness  Musculoskeletal Right knee tender medial and lateral joint lines No instability, pain, laxity with varus/valgus stress no calf pain or sign of DVT  Imaging prior xrays with end-stage DJD R knee, slight valgus deformity, erosion of the lateral tibial plateau, severe changes.  Assessment and Plan Primary osteoarthritis of right knee  (M17.11)  Pt with end-stage R knee DJD, bone-on-bone, refractory to conservative tx, scheduled for R total knee replacement by Dr. Tonita Cong on 11/04/15. We again discussed the procedure itself as well as risks, complications and alternatives, including but not limited to DVT, PE, infx, bleeding,  failure of procedure, need for secondary procedure including manipulation, nerve injury, ongoing pain/symptoms, anesthesia risk, even stroke or death. Also discussed typical post-op protocols, activity restrictions, need for PT, flexion/extension exercises, time out of work. Discussed need for DVT ppx post-op with Xarelto then ASA per protocol. Discussed dental ppx. Also discussed limitations post-operatively such as kneeling and squatting. All questions were answered. Patient desires to proceed with surgery as scheduled. Will hold ASA and NSAIDs accordingly. Will remain NPO after MN night before surgery. Will present to [facility] for pre-op testing. Plan Xarelto 3 weeks post-op for DVT ppx (given PE/DVT hx) then ASA, her IVC filter will remain in place 3 months post-op. Plan Percocet, Movantik, Colace. Plan D/C to [facility] then home, would like to start outpt right away post-op this time instead of home PT, will arrange. Will follow up 10-14 days post-op for suture removal and xrays. Her UA was positive for cloudy urine, leukocytes and Hgb. Will start doxy- she was called and advised, she has at home.  Plan right total knee replacement  Signed electronically by Lacie Draft, PA-C for Dr. Tonita Cong

## 2015-11-04 NOTE — Interval H&P Note (Signed)
History and Physical Interval Note:  11/04/2015 7:08 AM  Deanna Burke  has presented today for surgery, with the diagnosis of DJD RIGHT KNEE  The various methods of treatment have been discussed with the patient and family. After consideration of risks, benefits and other options for treatment, the patient has consented to  Procedure(s): RIGHT TOTAL KNEE ARTHROPLASTY (Right) as a surgical intervention .  The patient's history has been reviewed, patient examined, no change in status, stable for surgery.  I have reviewed the patient's chart and labs.  Questions were answered to the patient's satisfaction.   Patient treated pot for UTI. Currently no sx. No frequency or dysuria. Urine clear.  [name] C

## 2015-11-04 NOTE — Clinical Social Work Note (Signed)
Clinical Social Work Assessment  Patient Details  Name: Deanna Burke MRN: [id number] Date of Birth: April 02, 1951  Date of referral:  10/28/15               Reason for consult:  Facility Placement, Discharge Planning                Permission sought to share information with:  Chartered certified accountant granted to share information::  Yes, Verbal Permission Granted  Name::        Agency::     Relationship::     Contact Information:     Housing/Transportation Living arrangements for the past 2 months:  Single Family Home Source of Information:  Patient Patient Interpreter Needed:  None Criminal Activity/Legal Involvement Pertinent to Current Situation/Hospitalization:  No - Comment as needed Significant Relationships:  Siblings, Parents, Friend Lives with:  Self Do you feel safe going back to the place where you live?  No (SNF placement) Need for family participation in patient care:  No (Coment)  Care giving concerns:  Pt's care cannot be managed at home following hospital d/c.   Social Worker assessment / plan:  Pt hospitalized on 11/04/15 for pre planned right total knee replacement. CSW met with pt / friend to assist with d/c planning. Pt reports that she has made prior arrangements to have ST Rehab at [facility] at d/c. CSW has contacted SNF and d/c plan has been confirmed pending [place] authorization. PT eval / recommendations are pending. CSW will request authorization from insurance once PT recommendations are available.  CSW will continue to follow to assist with d/c planning.  Employment status:  Retired Nurse, adult PT Recommendations:  Not assessed at this time Information / Referral to community resources:  [place]  Patient/Family's Response to care: Pt feels ST rehab is needed at d/c.  Patient/Family's Understanding of and Emotional Response to Diagnosis, Current Treatment, and Prognosis:  Pt is  aware of her medical status. Pt  is pleased surgery is over and all went well. Pt is motivated to work with therapy and is looking forward to having rehab at [facility].  Emotional Assessment Appearance:  Appears stated age Attitude/Demeanor/Rapport:  Other (cooperative) Affect (typically observed):  Calm, Appropriate, Pleasant Orientation:  Oriented to Self, Oriented to Place, Oriented to  Time, Oriented to Situation Alcohol / Substance use:  Not Applicable Psych involvement (Current and /or in the community):  No (Comment)  Discharge Needs  Concerns to be addressed:  Discharge Planning Concerns Readmission within the last 30 days:  No Current discharge risk:  None Barriers to Discharge:  No Barriers Identified   Luretha Rued, [place] 11/04/2015, 3:22 PM

## 2015-11-04 NOTE — Transfer of Care (Signed)
Immediate Anesthesia Transfer of Care Note  Patient: Deanna Burke  Procedure(s) Performed: Procedure(s): RIGHT TOTAL KNEE ARTHROPLASTY (Right)  Patient Location: PACU  Anesthesia Type:MAC and Spinal  Level of Consciousness: awake, alert  and oriented  Airway & Oxygen Therapy: Patient Spontanous Breathing and Patient connected to face mask oxygen  Post-op Assessment: Report given to RN and Post -op Vital signs reviewed and stable  Post vital signs: Reviewed and stable  Last Vitals:  Filed Vitals:   11/04/15 0522  BP: 140/78  Pulse: 114  Temp: 36.7 C  Resp: 16    Last Pain:  Filed Vitals:   11/04/15 0547  PainSc: 3       Patients Stated Pain Goal: 3 (02/54/86 2824)  Complications: No apparent anesthesia complications

## 2015-11-04 NOTE — Brief Op Note (Signed)
11/04/2015  11:12 AM  PATIENT:  Sharyne Richters  65 y.o. female  PRE-OPERATIVE DIAGNOSIS:  DJD RIGHT KNEE  POST-OPERATIVE DIAGNOSIS:  DJD RIGHT KNEE  PROCEDURE:  Procedure(s): RIGHT TOTAL KNEE ARTHROPLASTY (Right)  SURGEON:  Surgeon(s) and Role:    * Susa Day, MD - Primary  PHYSICIAN ASSISTANT:   ASSISTANTS: [place]   ANESTHESIA:   general  EBL:  Total I/O In: 2100 [I.V.:2100] Out: 475 [Urine:400; Blood:75]  BLOOD ADMINISTERED:none  DRAINS: none   LOCAL MEDICATIONS USED:  MARCAINE     SPECIMEN:  No Specimen  DISPOSITION OF SPECIMEN:  N/A  COUNTS:  YES  TOURNIQUET:   Total Tourniquet Time Documented: Thigh (Right) - 47 minutes Total: Thigh (Right) - 47 minutes   DICTATION: .Other Dictation: Dictation Number [id number]  PLAN OF CARE: Admit to inpatient   PATIENT DISPOSITION:  PACU - hemodynamically stable.   Delay start of Pharmacological VTE agent (>24hrs) due to surgical blood loss or risk of bleeding: no

## 2015-11-04 NOTE — Progress Notes (Signed)
[facility] called at 5:30 and Dr. Tonita Cong paged at   6:10 pre op orders disappeared. No order for Vancomycin. Paged PA Dierdre Highman also. Called office on call and cell phone . Order per Dr. Tonita Cong at [phone]. Pharmacist called verbal order received by Everitt Amber.

## 2015-11-04 NOTE — Anesthesia Preprocedure Evaluation (Addendum)
Anesthesia Evaluation  Patient identified by MRN, date of birth, ID band Patient awake    Reviewed: Allergy & Precautions, NPO status , Patient's Chart, lab work & pertinent test results  Airway Mallampati: II   Neck ROM: Full    Dental  (+) Dental Advisory Given, Teeth Intact   Pulmonary sleep apnea ,  Previous PE, to have temp IVC placed prior to surg   breath sounds clear to auscultation       Cardiovascular hypertension, Pt. on medications + DVT   Rhythm:Regular  CATH 2013 essentially normal coronaries and good LVF, EKG 03/2015 WNL, Stress Test prior to cath thought to be artifact   Neuro/Psych    GI/Hepatic Neg liver ROS, PUD, illiiostomy, long standing ulcerative colitis   Endo/Other    Renal/GU R hydronephrosis,  Creat 1.3     Musculoskeletal  (+) Arthritis ,   Abdominal (+)  Abdomen: soft.    Peds  Hematology 13/39, Plts 282   Anesthesia Other Findings   Reproductive/Obstetrics                            Anesthesia Physical  Anesthesia Plan  ASA: III  Anesthesia Plan: Spinal   Post-op Pain Management:    Induction:   Airway Management Planned: Simple Face Mask  Additional Equipment:   Intra-op Plan:   Post-operative Plan:   Informed Consent: I have reviewed the patients History and Physical, chart, labs and discussed the procedure including the risks, benefits and alternatives for the proposed anesthesia with the patient or authorized representative who has indicated his/her understanding and acceptance.     Plan Discussed with:   Anesthesia Plan Comments: (Would consider Spinal if she agrees)       Anesthesia Quick Evaluation

## 2015-11-04 NOTE — Anesthesia Procedure Notes (Signed)
Spinal Patient location during procedure: OR Start time: 11/04/2015 7:21 AM End time: 11/04/2015 7:25 AM Staffing Anesthesiologist: Montez Hageman Resident/CRNA: Chrystine Oiler G Performed by: resident/CRNA  Preanesthetic Checklist Completed: patient identified, site marked, surgical consent, pre-op evaluation, timeout performed, IV checked, risks and benefits discussed and monitors and equipment checked Spinal Block Patient position: sitting Prep: ChloraPrep Patient monitoring: heart rate, continuous pulse ox and blood pressure Approach: midline Location: L2-3 Injection technique: single-shot Needle Needle type: Sprotte  Needle gauge: 24 G Needle length: 10 cm Needle insertion depth: 5 cm Assessment Sensory level: T6 Additional Notes Expiration date on SAB kit checked, sitting position, sterile prep and drape, L2-3 x 1 stick, +CSF pre and post injection.  Patient tolerated well.

## 2015-11-04 NOTE — Anesthesia Postprocedure Evaluation (Signed)
Anesthesia Post Note  Patient: Deanna Burke  Procedure(s) Performed: Procedure(s) (LRB): RIGHT TOTAL KNEE ARTHROPLASTY (Right)  Patient location during evaluation: PACU Anesthesia Type: Spinal Level of consciousness: awake and alert Pain management: pain level controlled Vital Signs Assessment: post-procedure vital signs reviewed and stable Respiratory status: spontaneous breathing and respiratory function stable Cardiovascular status: blood pressure returned to baseline and stable Postop Assessment: no headache, no backache and spinal receding Anesthetic complications: no    Last Vitals:  Filed Vitals:   11/04/15 1058 11/04/15 1111  BP: 114/66 123/62  Pulse:  84  Temp: 36.6 C 36.6 C  Resp:  13    Last Pain:  Filed Vitals:   11/04/15 1111  PainSc: 0-No pain                 Montez Hageman

## 2015-11-04 NOTE — Clinical Social Work Placement (Signed)
   CLINICAL SOCIAL WORK PLACEMENT  NOTE  Date:  11/04/2015  Patient Details  Name: Deanna Burke MRN: [id number] Date of Birth: [birth year]  Clinical Social Work is seeking post-discharge placement for this patient at the [place] level of care (*CSW will initial, date and re-position this form in  chart as items are completed):  No   Patient/family provided with [place] Work Department's list of facilities offering this level of care within the geographic area requested by the patient (or if unable, by the patient's family).  Yes   Patient/family informed of their freedom to choose among providers that offer the needed level of care, that participate in Medicare, Medicaid or managed care program needed by the patient, have an available bed and are willing to accept the patient.  Yes   Patient/family informed of [facility]'s ownership interest in [facility] and [facility], as well as of the fact that they are under no obligation to receive care at these facilities.  PASRR submitted to EDS on       PASRR number received on       Existing PASRR number confirmed on 11/04/15     FL2 transmitted to all facilities in geographic area requested by pt/family on 11/04/15     FL2 transmitted to all facilities within larger geographic area on       Patient informed that his/her managed care company has contracts with or will negotiate with certain facilities, including the following:        Yes   Patient/family informed of bed offers received.  Patient chooses bed at [facility]     Physician recommends and patient chooses bed at      Patient to be transferred to [facility] on  .  Patient to be transferred to facility by [facility]     Patient family notified on   of transfer.  Name of family member notified:        PHYSICIAN       Additional Comment:    _______________________________________________ Loraine Maple   [phone] 11/04/2015, 3:38 PM

## 2015-11-05 LAB — CBC
HCT: 35.2 % — ABNORMAL LOW (ref 36.0–46.0)
Hemoglobin: 11.8 g/dL — ABNORMAL LOW (ref 12.0–15.0)
MCH: 28.9 pg (ref 26.0–34.0)
MCHC: 33.5 g/dL (ref 30.0–36.0)
MCV: 86.3 fL (ref 78.0–100.0)
PLATELETS: 308 10*3/uL (ref 150–400)
RBC: 4.08 MIL/uL (ref 3.87–5.11)
RDW: 13.4 % (ref 11.5–15.5)
WBC: 11.2 10*3/uL — ABNORMAL HIGH (ref 4.0–10.5)

## 2015-11-05 LAB — BASIC METABOLIC PANEL
Anion gap: 8 (ref 5–15)
BUN: 22 mg/dL — AB (ref 6–20)
CALCIUM: 8.4 mg/dL — AB (ref 8.9–10.3)
CO2: 23 mmol/L (ref 22–32)
Chloride: 106 mmol/L (ref 101–111)
Creatinine, Ser: 0.98 mg/dL (ref 0.44–1.00)
GFR calc Af Amer: >60 mL/min (ref >60)
GFR, EST NON AFRICAN AMERICAN: 59 mL/min — AB (ref >60)
GLUCOSE: 126 mg/dL — AB (ref 65–99)
Potassium: 3.1 mmol/L — ABNORMAL LOW (ref 3.5–5.1)
Sodium: 137 mmol/L (ref 135–145)

## 2015-11-05 NOTE — Progress Notes (Signed)
OT Cancellation Note  Patient Details Name: Deanna Burke MRN: [id number] DOB: [birth year]   Cancelled Treatment:    Reason Eval/Treat Not Completed: Other (comment) -- Note pt's d/c plan is SNF. Will defer OT needs to SNF. Thank you.  Shayle Donahoo A 11/05/2015, 9:42 AM

## 2015-11-05 NOTE — Discharge Instructions (Addendum)
Elevate leg above heart 6x a day for 33mnutes each Use knee immobilizer while walking until can SLR x 10 Use knee immobilizer in bed to keep knee in extension Aquacel dressing may remain in place until follow up. May shower with aquacel dressing in place. If the dressing becomes saturated or peels off, you may remove aquacel dressing. Do not remove steri-strips if they are present. Place new dressing with gauze and tape or ACE bandage which should be kept clean and dry and changed daily.   INSTRUCTIONS AFTER JOINT REPLACEMENT   o Remove items at home which could result in a fall. This includes throw rugs or furniture in walking pathways o ICE to the affected joint every three hours while awake for 30 minutes at a time, for at least the first 3-5 days, and then as needed for pain and swelling.  Continue to use ice for pain and swelling. You may notice swelling that will progress down to the foot and ankle.  This is normal after surgery.  Elevate your leg when you are not up walking on it.   o Continue to use the breathing machine you got in the hospital (incentive spirometer) which will help keep your temperature down.  It is common for your temperature to cycle up and down following surgery, especially at night when you are not up moving around and exerting yourself.  The breathing machine keeps your lungs expanded and your temperature down.   DIET:  As you were doing prior to hospitalization, we recommend a well-balanced diet.  DRESSING / WOUND CARE / SHOWERING  You may shower 3 days after surgery, but keep the wounds dry during showering.  You may use an occlusive plastic wrap (Press'n Seal for example), NO SOAKING/SUBMERGING IN THE BATHTUB.  If the bandage gets wet, change with a clean dry gauze.  If the incision gets wet, pat the wound dry with a clean towel.  ACTIVITY  o Increase activity slowly as tolerated, but follow the weight bearing instructions below.   o No driving for 6 weeks or  until further direction given by your physician.  You cannot drive while taking narcotics.  o No lifting or carrying greater than 10 lbs. until further directed by your surgeon. o Avoid periods of inactivity such as sitting longer than an hour when not asleep. This helps prevent blood clots.  o You may return to work once you are authorized by your doctor.     WEIGHT BEARING   Weight bearing as tolerated with assist device (walker, cane, etc) as directed, use it as long as suggested by your surgeon or therapist, typically at least 4-6 weeks.   EXERCISES  Results after joint replacement surgery are often greatly improved when you follow the exercise, range of motion and muscle strengthening exercises prescribed by your doctor. Safety measures are also important to protect the joint from further injury. Any time any of these exercises cause you to have increased pain or swelling, decrease what you are doing until you are comfortable again and then slowly increase them. If you have problems or questions, call your caregiver or physical therapist for advice.   Rehabilitation is important following a joint replacement. After just a few days of immobilization, the muscles of the leg can become weakened and shrink (atrophy).  These exercises are designed to build up the tone and strength of the thigh and leg muscles and to improve motion. Often times heat used for twenty to thirty minutes before working  out will loosen up your tissues and help with improving the range of motion but do not use heat for the first two weeks following surgery (sometimes heat can increase post-operative swelling).   These exercises can be done on a training (exercise) mat, on the floor, on a table or on a bed. Use whatever works the best and is most comfortable for you.    Use music or television while you are exercising so that the exercises are a pleasant break in your day. This will make your life better with the exercises  acting as a break in your routine that you can look forward to.   Perform all exercises about fifteen times, three times per day or as directed.  You should exercise both the operative leg and the other leg as well.  Exercises include:    Quad Sets - Tighten up the muscle on the front of the thigh (Quad) and hold for 5-10 seconds.    Straight Leg Raises - With your knee straight (if you were given a brace, keep it on), lift the leg to 60 degrees, hold for 3 seconds, and slowly lower the leg.  Perform this exercise against resistance later as your leg gets stronger.   Leg Slides: Lying on your back, slowly slide your foot toward your buttocks, bending your knee up off the floor (only go as far as is comfortable). Then slowly slide your foot back down until your leg is flat on the floor again.   Angel Wings: Lying on your back spread your legs to the side as far apart as you can without causing discomfort.   Hamstring Strength:  Lying on your back, push your heel against the floor with your leg straight by tightening up the muscles of your buttocks.  Repeat, but this time bend your knee to a comfortable angle, and push your heel against the floor.  You may put a pillow under the heel to make it more comfortable if necessary.   A rehabilitation program following joint replacement surgery can speed recovery and prevent re-injury in the future due to weakened muscles. Contact your doctor or a physical therapist for more information on knee rehabilitation.    CONSTIPATION  Constipation is defined medically as fewer than three stools per week and severe constipation as less than one stool per week.  Even if you have a regular bowel pattern at home, your normal regimen is likely to be disrupted due to multiple reasons following surgery.  Combination of anesthesia, postoperative narcotics, change in appetite and fluid intake all can affect your bowels.   YOU MUST use at least one of the following  options; they are listed in order of increasing strength to get the job done.  They are all available over the counter, and you may need to use some, POSSIBLY even all of these options:    Drink plenty of fluids (prune juice may be helpful) and high fiber foods Colace 100 mg by mouth twice a day  Senokot for constipation as directed and as needed Dulcolax (bisacodyl), take with full glass of water  Miralax (polyethylene glycol) once or twice a day as needed.  If you have tried all these things and are unable to have a bowel movement in the first 3-4 days after surgery call either your surgeon or your primary doctor.    If you experience loose stools or diarrhea, hold the medications until you stool forms back up.  If your symptoms do not get  better within 1 week or if they get worse, check with your doctor.  If you experience "the worst abdominal pain ever" or develop nausea or vomiting, please contact the office immediately for further recommendations for treatment.   ITCHING:  If you experience itching with your medications, try taking only a single pain pill, or even half a pain pill at a time.  You can also use Benadryl over the counter for itching or also to help with sleep.   TED HOSE STOCKINGS:  Use stockings on both legs until for at least 2 weeks or as directed by physician office. They may be removed at night for sleeping.  MEDICATIONS:  See your medication summary on the After Visit Summary that nursing will review with you.  You may have some home medications which will be placed on hold until you complete the course of blood thinner medication.  It is important for you to complete the blood thinner medication as prescribed.  PRECAUTIONS:  If you experience chest pain or shortness of breath - call 911 immediately for transfer to the hospital emergency department.   If you develop a fever greater that 101 F, purulent drainage from wound, increased redness or drainage from wound, foul  odor from the wound/dressing, or calf pain - CONTACT YOUR SURGEON.                                                   FOLLOW-UP APPOINTMENTS:  If you do not already have a post-op appointment, please call the office for an appointment to be seen by your surgeon.  Guidelines for how soon to be seen are listed in your After Visit Summary, but are typically between 1-4 weeks after surgery.  OTHER INSTRUCTIONS:   Knee Replacement:  Do not place pillow under knee, focus on keeping the knee straight while resting. CPM instructions: 0-90 degrees, 2 hours in the morning, 2 hours in the afternoon, and 2 hours in the evening. Place foam block, curve side up under heel at all times except when in CPM or when walking.  DO NOT modify, tear, cut, or change the foam block in any way.  MAKE SURE YOU:   Understand these instructions.   Get help right away if you are not doing well or get worse.    Thank you for letting us be a part of your medical care team.  It is a privilege we respect greatly.  We hope these instructions will help you stay on track for a fast and full recovery!   Information on my medicine - XARELTO (Rivaroxaban)  This medication education was reviewed with me or my healthcare representative as part of my discharge preparation.  The pharmacist that spoke with me during my hospital stay was:  Luiz Ochoa [facility]  Why was Xarelto prescribed for you? Xarelto was prescribed for you to reduce the risk of blood clots forming after orthopedic surgery. The medical term for these abnormal blood clots is venous thromboembolism (VTE).  What do you need to know about xarelto ? Take your Xarelto ONCE DAILY at the same time every day. You may take it either with or without food.  If you have difficulty swallowing the tablet whole, you may crush it and mix in applesauce just prior to taking your dose.  Take Xarelto exactly as prescribed by your  doctor and DO NOT stop taking Xarelto without  talking to the doctor who prescribed the medication.  Stopping without other VTE prevention medication to take the place of Xarelto may increase your risk of developing a clot.  After discharge, you should have regular check-up appointments with your healthcare provider that is prescribing your Xarelto.    What do you do if you miss a dose? If you miss a dose, take it as soon as you remember on the same day then continue your regularly scheduled once daily regimen the next day. Do not take two doses of Xarelto on the same day.   Important Safety Information A possible side effect of Xarelto is bleeding. You should call your healthcare provider right away if you experience any of the following: ? Bleeding from an injury or your nose that does not stop. ? Unusual colored urine (red or dark brown) or unusual colored stools (red or black). ? Unusual bruising for unknown reasons. ? A serious fall or if you hit your head (even if there is no bleeding).  Some medicines may interact with Xarelto and might increase your risk of bleeding while on Xarelto. To help avoid this, consult your healthcare provider or pharmacist prior to using any new prescription or non-prescription medications, including herbals, vitamins, non-steroidal anti-inflammatory drugs (NSAIDs) and supplements.  This website has more information on Xarelto: [email or web address]

## 2015-11-05 NOTE — Progress Notes (Signed)
Subjective: 1 Day Post-Op Procedure(s) (LRB): RIGHT TOTAL KNEE ARTHROPLASTY (Right) Patient reports pain as 4 on 0-10 scale.    Objective: Vital signs in last 24 hours: Temp:  [97.7 F (36.5 C)-98.3 F (36.8 C)] 98.1 F (36.7 C) (07/14 0621) Pulse Rate:  [76-97] 94 (07/14 0621) Resp:  [12-18] 16 (07/14 0621) BP: (102-125)/(51-90) 113/60 mmHg (07/14 0621) SpO2:  [99 %-100 %] 99 % (07/14 0621)  Intake/Output from previous day: 07/13 0701 - 07/14 0700 In: 3055.8 [P.O.:840; I.V.:2215.8] Out: 2500 [Urine:2225; Stool:200; Blood:75] Intake/Output this shift: Total I/O In: 120 [P.O.:120] Out: -    Recent Labs  11/05/15 0414  HGB 11.8*    Recent Labs  11/05/15 0414  WBC 11.2*  RBC 4.08  HCT 35.2*  PLT 308    Recent Labs  11/05/15 0414  NA 137  K 3.1*  CL 106  CO2 23  BUN 22*  CREATININE 0.98  GLUCOSE 126*  CALCIUM 8.4*   No results for input(s): LABPT, INR in the last 72 hours.  Neurologically intact Dorsiflexion/Plantar flexion intact Incision: dressing C/D/I  Assessment/Plan: 1 Day Post-Op Procedure(s) (LRB): RIGHT TOTAL KNEE ARTHROPLASTY (Right) Advance diet Up with therapy D/C IV fluids Discharge to SNF 1-2 DAYS check k tomorrow  Deanna Burke C 11/05/2015, 9:21 AM

## 2015-11-05 NOTE — Evaluation (Signed)
Physical Therapy Evaluation Patient Details Name: Deanna Burke MRN: [id number] DOB: 09-07-1950 Today's Date: 11/05/2015   History of Present Illness  Pt is a 65 year old female s/p R TKA with hx of L TKA, ileostomy, breast cancer  Clinical Impression  Pt is s/p R TKA resulting in the deficits listed below (see PT Problem List).  Pt will benefit from skilled PT to increase their independence and safety with mobility to allow discharge to the venue listed below.  Pt's mobility limited this morning due to pain however was able to transfer to recliner.  Pt lives alone and would benefit from ST-SNF upon d/c.    Follow Up Recommendations SNF    Equipment Recommendations  None recommended by PT    Recommendations for Other Services       Precautions / Restrictions Precautions Precautions: Fall;Knee Restrictions Other Position/Activity Restrictions: WBAT      Mobility  Bed Mobility Overal bed mobility: Needs Assistance Bed Mobility: Supine to Sit     Supine to sit: Min assist;HOB elevated     General bed mobility comments: assist for R LE  Transfers Overall transfer level: Needs assistance Equipment used: Rolling walker (2 wheeled) Transfers: Sit to/from [facility] Sit to Stand: Min assist Stand pivot transfers: Min assist       General transfer comment: verbal cues for UE and LE positioning, pt limited WBing due to pain (obserevd TDWB)  Ambulation/Gait Ambulation/Gait assistance:  (pt unable at this time due to pain)              Stairs            Wheelchair Mobility    Modified Rankin (Stroke Patients Only)       Balance                                             Pertinent Vitals/Pain Pain Assessment: 0-10 Pain Score: 10-Worst pain ever Pain Location: R knee with mobility Pain Descriptors / Indicators: Aching Pain Intervention(s): Limited activity within patient's tolerance;Monitored during session;RN  gave pain meds during session;Repositioned;Ice applied    Home Living Family/patient expects to be discharged to:: Private residence Living Arrangements: Alone           Home Layout: One level Home Equipment: Environmental consultant - 2 wheels      Prior Function Level of Independence: Independent               Hand Dominance        Extremity/Trunk Assessment               Lower Extremity Assessment: RLE deficits/detail RLE Deficits / Details: maintained KI       Communication   Communication: No difficulties  Cognition Arousal/Alertness: Awake/alert Behavior During Therapy: WFL for tasks assessed/performed Overall Cognitive Status: Within Functional Limits for tasks assessed                      General Comments      Exercises        Assessment/Plan    PT Assessment Patient needs continued PT services  PT Diagnosis Difficulty walking;Acute pain   PT Problem List Decreased strength;Decreased activity tolerance;Decreased mobility;Pain;Decreased range of motion  PT Treatment Interventions DME instruction;Gait training;Functional mobility training;Patient/family education;Therapeutic exercise;Therapeutic activities   PT Goals (Current goals can be found in the Care  Plan section) Acute Rehab PT Goals PT Goal Formulation: With patient Time For Goal Achievement: 11/09/15 Potential to Achieve Goals: Good    Frequency 7X/week   Barriers to discharge        Co-evaluation               End of Session Equipment Utilized During Treatment: Gait belt;Right knee immobilizer Activity Tolerance: Patient limited by pain Patient left: in chair;with chair alarm set;with nursing/sitter in room Nurse Communication: Mobility status         Time: 6606-0045 PT Time Calculation (min) (ACUTE ONLY): 14 min   Charges:   PT Evaluation $PT Eval Low Complexity: 1 Procedure     PT G Codes:        Deanna Burke,Deanna Burke 11/05/2015, 10:08 AM Deanna Burke, PT,  DPT 11/05/2015 Pager: [phone]

## 2015-11-05 NOTE — Discharge Summary (Signed)
Physician Discharge Summary   Patient ID: Deanna Burke MRN: [id number] DOB/AGE: 1951/01/07 65 y.o.  Admit date: 11/04/2015 Discharge date: anticipate 11/06/15  Primary Diagnosis: R knee primary osteoarthritis Admission Diagnoses:  Past Medical History  Diagnosis Date  . Ulcerative colitis [id number]  . Pulmonary embolus ([place]) 1971  . Phlebitis 1971  . Small bowel obstruction ([place]) [id number]  . Small bowel perforation ([place]) 1971/1996  . Large bowel perforation ([place]) 1971/1996  . Scoliosis   . Torn rotator cuff 2005  . History of bone density study 2012  . Fatigue   . Cataract     Left Eye  . Hot flashes   . 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 Hypertension   . Cancer (HCC)     DCIS L breast  . H/O ulcerative colitis   . Breast cancer ([place])   . Arthritis     hands, lumbar spine, hips, right ankle  . S/P ileostomy ([place])   . S/P radiation therapy 07/24/11 - 09/06/11    Left Breast/ 5000 cGy in 25 Fractions with Boost of 1000 cGy in 5 Fractions  . 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 Abnormal finding on cardiovascular stress test, ischemia anterolateral 02/13/2012    follow heart cath - pt told one tiny blockage that didn't even matter  . Syncope, ? anginal equivilant 02/13/2012  . Ileostomy in place, secondary to ulcerative colitis x 20 years 02/13/2012  . History of recurrent UTIs 02/13/2012  . Sleep apnea 2009    uses cpap-setting is 1  . H/O nephrostomy 08/13/12   Discharge Diagnoses:   Principal Problem:   Primary osteoarthritis of right knee Active Problems:   Right knee DJD  Estimated body mass index is 29.34 kg/(m^2) as calculated from the following:   Height as of this encounter: 5' 4"  (1.626 m).   Weight as of this encounter: 77.565 kg (171 lb).  Procedure:  Procedure(s) (LRB): RIGHT TOTAL KNEE ARTHROPLASTY (Right)   Consults: None  HPI: see H&P Laboratory  Data: Admission on 11/04/2015  Component Date Value Ref Range Status  . WBC 11/05/2015 11.2* 4.0 - 10.5 K/uL Final  . RBC 11/05/2015 4.08  3.87 - 5.11 MIL/uL Final  . Hemoglobin 11/05/2015 11.8* 12.0 - 15.0 g/dL Final  . HCT 11/05/2015 35.2* 36.0 - 46.0 % Final  . MCV 11/05/2015 86.3  78.0 - 100.0 fL Final  . MCH 11/05/2015 28.9  26.0 - 34.0 pg Final  . MCHC 11/05/2015 33.5  30.0 - 36.0 g/dL Final  . RDW 11/05/2015 13.4  11.5 - 15.5 % Final  . Platelets 11/05/2015 308  150 - 400 K/uL Final  . Sodium 11/05/2015 137  135 - 145 mmol/L Final  . Potassium 11/05/2015 3.1* 3.5 - 5.1 mmol/L Final  . Chloride 11/05/2015 106  101 - 111 mmol/L Final  . CO2 11/05/2015 23  22 - 32 mmol/L Final  . Glucose, Bld 11/05/2015 126* 65 - 99 mg/dL Final  . BUN 11/05/2015 22* 6 - 20 mg/dL Final  . Creatinine, Ser 11/05/2015 0.98  0.44 - 1.00 mg/dL Final  . Calcium 11/05/2015 8.4* 8.9 - 10.3 mg/dL Final  . GFR calc non Af Amer 11/05/2015 59* >60 mL/min Final  . GFR calc Af Amer 11/05/2015 >60  >60 mL/min Final   Comment: (NOTE) The eGFR has been calculated using the CKD EPI equation. This calculation has  not been validated in all clinical situations. eGFR's persistently <60 mL/min signify possible Chronic Kidney Disease.   Georgiann Hahn gap 11/05/2015 8  5 - 15 Final  Hospital Outpatient Visit on 10/28/2015  Component Date Value Ref Range Status  . MRSA, PCR 10/28/2015 NEGATIVE  NEGATIVE Final  . Staphylococcus aureus 10/28/2015 NEGATIVE  NEGATIVE Final   Comment:        The Xpert SA Assay (FDA approved for NASAL specimens in patients over 49 years of age), is one component of a comprehensive surveillance program.  Test performance has been validated by [facility] for patients greater than or equal to 36 year old. It is not intended to diagnose infection nor to guide or monitor treatment.   . Sodium 10/28/2015 139  135 - 145 mmol/L Final  . Potassium 10/28/2015 3.7  3.5 - 5.1 mmol/L Final  .  Chloride 10/28/2015 104  101 - 111 mmol/L Final  . CO2 10/28/2015 27  22 - 32 mmol/L Final  . Glucose, Bld 10/28/2015 96  65 - 99 mg/dL Final  . BUN 10/28/2015 30* 6 - 20 mg/dL Final  . Creatinine, Ser 10/28/2015 1.28* 0.44 - 1.00 mg/dL Final  . Calcium 10/28/2015 10.1  8.9 - 10.3 mg/dL Final  . GFR calc non Af Amer 10/28/2015 43* >60 mL/min Final  . GFR calc Af Amer 10/28/2015 49* >60 mL/min Final   Comment: (NOTE) The eGFR has been calculated using the CKD EPI equation. This calculation has not been validated in all clinical situations. eGFR's persistently <60 mL/min signify possible Chronic Kidney Disease.   . Anion gap 10/28/2015 8  5 - 15 Final  . WBC 10/28/2015 5.3  4.0 - 10.5 K/uL Final  . RBC 10/28/2015 4.56  3.87 - 5.11 MIL/uL Final  . Hemoglobin 10/28/2015 13.3  12.0 - 15.0 g/dL Final  . HCT 10/28/2015 40.2  36.0 - 46.0 % Final  . MCV 10/28/2015 88.2  78.0 - 100.0 fL Final  . MCH 10/28/2015 29.2  26.0 - 34.0 pg Final  . MCHC 10/28/2015 33.1  30.0 - 36.0 g/dL Final  . RDW 10/28/2015 13.2  11.5 - 15.5 % Final  . Platelets 10/28/2015 296  150 - 400 K/uL Final  . Prothrombin Time 10/28/2015 13.0  11.6 - 15.2 seconds Final  . INR 10/28/2015 0.96  0.00 - 1.49 Final  . ABO/RH(D) 10/28/2015 O POS   Final  . Antibody Screen 10/28/2015 NEG   Final  . Sample Expiration 10/28/2015 11/06/2015   Final  . Extend sample reason 10/28/2015 NO TRANSFUSIONS OR PREGNANCY IN THE PAST 3 MONTHS   Final  . Color, Urine 10/28/2015 YELLOW  YELLOW Final  . APPearance 10/28/2015 CLOUDY* CLEAR Final  . Specific Gravity, Urine 10/28/2015 1.016  1.005 - 1.030 Final  . pH 10/28/2015 6.0  5.0 - 8.0 Final  . Glucose, UA 10/28/2015 NEGATIVE  NEGATIVE mg/dL Final  . Hgb urine dipstick 10/28/2015 LARGE* NEGATIVE Final  . Bilirubin Urine 10/28/2015 NEGATIVE  NEGATIVE Final  . Ketones, ur 10/28/2015 NEGATIVE  NEGATIVE mg/dL Final  . Protein, ur 10/28/2015 NEGATIVE  NEGATIVE mg/dL Final  . Nitrite 10/28/2015  NEGATIVE  NEGATIVE Final  . Leukocytes, UA 10/28/2015 LARGE* NEGATIVE Final  . Squamous Epithelial / LPF 10/28/2015 0-5* NONE SEEN Final  . WBC, UA 10/28/2015 TOO NUMEROUS TO COUNT  0 - 5 WBC/hpf Final  . RBC / HPF 10/28/2015 6-30  0 - 5 RBC/hpf Final  . Bacteria, UA 10/28/2015 FEW* NONE SEEN Final  .  Urine-Other 10/28/2015 YEAST PRESENT   Final     X-Rays:Dg Knee 1-2 Views Right  10/28/2015  CLINICAL DATA:  Preop for right knee surgery, diffuse right knee pain, no injury EXAM: RIGHT KNEE - 1-2 VIEW COMPARISON:  None. FINDINGS: There is tricompartmental degenerative joint disease the right knee. There is considerable loss of joint space particularly laterally with almost complete loss of joint space with sclerosis and spurring present. There does appear to be faint chondrocalcinosis, and CPPD arthropathy is a consideration. No joint effusion is seen. No fracture is noted. IMPRESSION: Significant tricompartmental degenerative joint disease primarily involving the lateral and patellofemoral compartments. No fracture or effusion. Faint chondrocalcinosis -question CPPD. Electronically Signed   By: Ivar Drape M.D.   On: 10/28/2015 13:05   Dg Knee Right Port  11/04/2015  CLINICAL DATA:  Status post right knee replacement EXAM: PORTABLE RIGHT KNEE - 1-2 VIEW COMPARISON:  10/28/2015 FINDINGS: Right knee prosthesis is now seen. Air is noted in the surgical bed. No acute bony abnormality is seen. IMPRESSION: Status post right knee replacement. Electronically Signed   By: Inez Catalina M.D.   On: 11/04/2015 09:52    EKG: Orders placed or performed during the hospital encounter of 03/25/15  . EKG 12 lead  . EKG 12 lead     Hospital Course: MADISYN MAWHINNEY is a 65 y.o. who was admitted to [facility]. They were brought to the operating room on 11/04/2015 and underwent Procedure(s): RIGHT TOTAL KNEE ARTHROPLASTY.  Patient tolerated the procedure well and was later transferred to the recovery room  and then to the orthopaedic floor for postoperative care.  They were given PO and IV analgesics for pain control following their surgery.  They were given 24 hours of postoperative antibiotics of  Anti-infectives    Start     Dose/Rate Route Frequency Ordered Stop   11/04/15 1800  vancomycin (VANCOCIN) IVPB 1000 mg/200 mL premix     1,000 mg 200 mL/hr over 60 Minutes Intravenous Every 12 hours 11/04/15 1118 11/04/15 1849   11/04/15 0746  polymyxin B 500,000 Units, bacitracin 50,000 Units in sodium chloride irrigation 0.9 % 500 mL irrigation  Status:  Discontinued       As needed 11/04/15 0801 11/04/15 0924   11/04/15 0630  vancomycin (VANCOCIN) IVPB 1000 mg/200 mL premix     1,000 mg 200 mL/hr over 60 Minutes Intravenous On call to O.R. 11/04/15 1610 11/04/15 0730     and started on DVT prophylaxis in the form of Xarelto, TED hose and SCDs.   PT and OT were ordered for total joint protocol.  Discharge planning consulted to help with postop disposition and equipment needs.  Patient had a good night on the evening of surgery.  They started to get up OOB with therapy on day one. Hemovac drain was pulled without difficulty.  Continued to work with therapy into day two. By day two, the patient had progressed with therapy and meeting their goals.  Incision was healing well.  Patient was seen in rounds and was ready to go to SNF.   Diet: Regular diet Activity:WBAT Follow-up:10-14 days post-op Disposition - SNF- [facility] Discharged Condition: good      Medication List    TAKE these medications        docusate sodium 100 MG capsule  Commonly known as:  COLACE  Take 1 capsule (100 mg total) by mouth 2 (two) times daily as needed for mild constipation.  oxyCODONE-acetaminophen 5-325 MG tablet  Commonly known as:  PERCOCET  Take 1-2 tablets by mouth every 4 (four) hours as needed for severe pain.     polyethylene glycol packet  Commonly known as:  MIRALAX / GLYCOLAX  Take 17 g by  mouth daily.     rivaroxaban 10 MG Tabs tablet  Commonly known as:  XARELTO  Take 1 tablet (10 mg total) by mouth daily.      ASK your doctor about these medications        acetaminophen 500 MG tablet  Commonly known as:  TYLENOL  Take 1,000 mg by mouth every 6 (six) hours as needed for moderate pain.     CERTAVITE SENIOR/ANTIOXIDANT PO  Take 1 tablet by mouth daily.     cholecalciferol 1000 units tablet  Commonly known as:  VITAMIN D  Take 1,000 Units by mouth daily.     doxycycline 100 MG tablet  Commonly known as:  VIBRA-TABS  Take 100 mg by mouth 2 (two) times daily.     hydrOXYzine 25 MG tablet  Commonly known as:  ATARAX/VISTARIL  Take 25 mg by mouth at bedtime.     OVER THE COUNTER MEDICATION  Probiotic     OVER THE COUNTER MEDICATION  Bismuth subgallate     oxybutynin 5 MG tablet  Commonly known as:  DITROPAN  Take 5 mg by mouth every 8 (eight) hours as needed for bladder spasms.     potassium chloride SA 20 MEQ tablet  Commonly known as:  K-DUR,KLOR-CON  Take 1 tablet (20 mEq total) by mouth daily.     tamoxifen 20 MG tablet  Commonly known as:  NOLVADEX  Take 1 tablet (20 mg total) by mouth daily.     triamterene-hydrochlorothiazide 37.5-25 MG tablet  Commonly known as:  MAXZIDE-25  Take 1 tablet by mouth every morning.           Follow-up Information    Follow up with BEANE,JEFFREY C, MD In 2 weeks.   Specialty:  Orthopedic Surgery   Contact information:   [address] [place] [address] [phone]       Signed: Lacie Draft, PA-C Orthopaedic Surgery 11/05/2015, 9:26 AM

## 2015-11-05 NOTE — Progress Notes (Signed)
CSW assisting with d/c planning. Pt has a ST Rehab bed at [facility] if stable for d/c. Tentative d/c summary has been sent to SNF. [place] Medicare has provided authorization for Saturday / Sunday. CSW will follow to assist with d/c planning to SNF.  Werner Lean LCSW [phone]

## 2015-11-05 NOTE — Progress Notes (Signed)
Physical Therapy Treatment Note    11/05/15 1600  PT Visit Information  Last PT Received On 11/05/15  Assistance Needed +1  History of Present Illness Pt is a 65 year old female s/p R TKA with hx of L TKA, ileostomy, breast cancer  Subjective Data  Subjective Pt ambulated in hallway and performed LE exercises.  Pt reports pain much better this afternoon.  Precautions  Precautions Fall;Knee  Required Braces or Orthoses Knee Immobilizer - Right  Restrictions  Other Position/Activity Restrictions WBAT  Pain Assessment  Pain Assessment 0-10  Pain Score 4  Pain Location R knee  Pain Descriptors / Indicators Aching  Pain Intervention(s) Limited activity within patient's tolerance;Monitored during session;Repositioned;Premedicated before session  Cognition  Arousal/Alertness Awake/alert  Behavior During Therapy WFL for tasks assessed/performed  Overall Cognitive Status Within Functional Limits for tasks assessed  Transfers  Overall transfer level Needs assistance  Equipment used Rolling walker (2 wheeled)  Transfers Sit to/from Stand  Sit to Stand Min assist  General transfer comment verbal cues for UE and LE positioning, assist to rise  Ambulation/Gait  Ambulation/Gait assistance Min guard  Ambulation Distance (Feet) 80 Feet  Assistive device Rolling walker (2 wheeled)  Gait Pattern/deviations Step-to pattern;Antalgic;Decreased stance time - right  Exercises  Exercises Total Joint  Total Joint Exercises  Ankle Circles/Pumps AROM;10 reps;Both  Quad Sets AROM;Both;10 reps  Short Arc [employer];Right;10 reps  Heel Slides AAROM;Right;10 reps  Hip ABduction/ADduction AAROM;Right;10 reps  Straight Leg Raises AAROM;Right;10 reps  PT - End of Session  Activity Tolerance Patient tolerated treatment well  Patient left in chair;with call bell/phone within reach;with chair alarm set  PT - Assessment/Plan  PT Plan Current plan remains appropriate  PT Frequency (ACUTE ONLY) 7X/week   Follow Up Recommendations SNF  PT equipment None recommended by PT  PT Goal Progression  Progress towards PT goals Progressing toward goals  PT Time Calculation  PT Start Time (ACUTE ONLY) 1312  PT Stop Time (ACUTE ONLY) 1329  PT Time Calculation (min) (ACUTE ONLY) 17 min  PT General Charges  $$ ACUTE PT VISIT 1 Procedure  PT Treatments  $Therapeutic Exercise 8-22 mins   Carmelia Bake, PT, DPT 11/05/2015 Pager: [phone]

## 2015-11-06 LAB — CBC
HCT: 34.7 % — ABNORMAL LOW (ref 36.0–46.0)
HEMOGLOBIN: 11.4 g/dL — AB (ref 12.0–15.0)
MCH: 28.6 pg (ref 26.0–34.0)
MCHC: 32.9 g/dL (ref 30.0–36.0)
MCV: 87.2 fL (ref 78.0–100.0)
PLATELETS: 260 10*3/uL (ref 150–400)
RBC: 3.98 MIL/uL (ref 3.87–5.11)
RDW: 13 % (ref 11.5–15.5)
WBC: 8.6 10*3/uL (ref 4.0–10.5)

## 2015-11-06 MED ORDER — RIVAROXABAN 10 MG PO TABS: 10.0000 mg | ORAL_TABLET | Freq: Every day | ORAL | Status: DC

## 2015-11-06 NOTE — Progress Notes (Signed)
Pt has a ST Rehab bed today at [facility]. CSW will assist with d/c planning.  Werner Lean LCSW [phone]

## 2015-11-06 NOTE — Progress Notes (Signed)
Physical Therapy Treatment Note    11/06/15 1600  PT Visit Information  Last PT Received On 11/06/15  Assistance Needed +1  History of Present Illness Pt is a 65 year old female s/p R TKA with hx of L TKA, ileostomy, breast cancer  Subjective Data  Subjective Pt premedicated however in more pain this afternoon.  Pt performed a couple exercises and ambulated in hallway.  Precautions  Precautions Fall;Knee  Required Braces or Orthoses Knee Immobilizer - Right  Restrictions  Other Position/Activity Restrictions WBAT  Pain Assessment  Pain Assessment 0-10  Pain Score 6  Pain Location R knee  Pain Descriptors / Indicators Aching;Sore  Pain Intervention(s) Ice applied;Repositioned;Premedicated before session;Limited activity within patient's tolerance;Monitored during session  Cognition  Arousal/Alertness Awake/alert  Behavior During Therapy WFL for tasks assessed/performed  Overall Cognitive Status Within Functional Limits for tasks assessed  Bed Mobility  Overal bed mobility Needs Assistance  Bed Mobility Supine to Sit;Sit to Supine  Supine to sit Min assist;HOB elevated  Sit to supine Min assist  General bed mobility comments assist for R LE  Transfers  Overall transfer level Needs assistance  Equipment used Rolling walker (2 wheeled)  Transfers Sit to/from Stand  Sit to Stand Min assist  General transfer comment verbal cues for UE and LE positioning, assist to rise  Ambulation/Gait  Ambulation/Gait assistance Min guard  Ambulation Distance (Feet) 40 Feet  Assistive device Rolling walker (2 wheeled)  Gait Pattern/deviations Step-to pattern;Antalgic  General Gait Details verbal cues for step length and posture  Exercises  Exercises Total Joint  Total Joint Exercises  Ankle Circles/Pumps AROM;10 reps;Both  Quad Sets AROM;Both;10 reps  Short Arc [employer];Right;10 reps  Heel Slides AAROM;Right;10 reps  PT - End of Session  Equipment Utilized During Treatment Right knee  immobilizer  Activity Tolerance Patient limited by pain  Patient left in bed;with call bell/phone within reach  PT - Assessment/Plan  PT Plan Current plan remains appropriate  PT Frequency (ACUTE ONLY) 7X/week  Follow Up Recommendations SNF  PT equipment None recommended by PT  PT Goal Progression  Progress towards PT goals Progressing toward goals  PT Time Calculation  PT Start Time (ACUTE ONLY) 1508  PT Stop Time (ACUTE ONLY) 1526  PT Time Calculation (min) (ACUTE ONLY) 18 min  PT General Charges  $$ ACUTE PT VISIT 1 Procedure  PT Treatments  $Gait Training 8-22 mins   Carmelia Bake, PT, DPT 11/06/2015 Pager: [phone]

## 2015-11-06 NOTE — Progress Notes (Signed)
Physical Therapy Treatment Patient Details Name: Deanna Burke MRN: [id number] DOB: [birth year] Today's Date: Nov 27, 2015    History of Present Illness Pt is a 65 year old female s/p R TKA with hx of L TKA, ileostomy, breast cancer    PT Comments    Pt reports pain has been an issue today however premedicated before session so agreeable to ambulate.  Pt plans to d/c to SNF, likely tomorrow.  Follow Up Recommendations  SNF     Equipment Recommendations  None recommended by PT    Recommendations for Other Services       Precautions / Restrictions Precautions Precautions: Fall;Knee Required Braces or Orthoses: Knee Immobilizer - Right Restrictions Other Position/Activity Restrictions: WBAT    Mobility  Bed Mobility Overal bed mobility: Needs Assistance Bed Mobility: Supine to Sit;Sit to Supine     Supine to sit: Min assist;HOB elevated Sit to supine: Min assist   General bed mobility comments: assist for R LE  Transfers Overall transfer level: Needs assistance Equipment used: Rolling walker (2 wheeled) Transfers: Sit to/from Stand Sit to Stand: Min assist         General transfer comment: verbal cues for UE and LE positioning, assist to rise  Ambulation/Gait Ambulation/Gait assistance: Min guard Ambulation Distance (Feet): 80 Feet Assistive device: Rolling walker (2 wheeled) Gait Pattern/deviations: Step-to pattern;Antalgic;Decreased stance time - right     General Gait Details: verbal cues for step length and posture   Stairs            Wheelchair Mobility    Modified Rankin (Stroke Patients Only)       Balance                                    Cognition Arousal/Alertness: Awake/alert Behavior During Therapy: WFL for tasks assessed/performed Overall Cognitive Status: Within Functional Limits for tasks assessed                      Exercises      General Comments        Pertinent Vitals/Pain Pain  Assessment: 0-10 Pain Score: 4  Pain Location: R knee Pain Descriptors / Indicators: Aching Pain Intervention(s): Limited activity within patient's tolerance;Repositioned;Monitored during session;Premedicated before session    Home Living                      Prior Function            PT Goals (current goals can now be found in the care plan section) Progress towards PT goals: Progressing toward goals    Frequency  7X/week    PT Plan Current plan remains appropriate    Co-evaluation             End of Session Equipment Utilized During Treatment: Gait belt;Right knee immobilizer Activity Tolerance: Patient tolerated treatment well Patient left: in bed;with call bell/phone within reach     Time: 7829-5621 PT Time Calculation (min) (ACUTE ONLY): 11 min  Charges:  $Gait Training: 8-22 mins                    G Codes:      Jiovanna Frei,KATHrine E 11-27-2015, 12:56 PM Carmelia Bake, PT, DPT 11-27-2015 Pager: [phone]

## 2015-11-06 NOTE — Progress Notes (Signed)
Patient ID: Deanna Burke, female   DOB: [birth year], 65 y.o.   MRN: [id number] Subjective: 2 Days Post-Op Procedure(s) (LRB): RIGHT TOTAL KNEE ARTHROPLASTY (Right) Patient reports pain as moderate.    Patient has complaints of R knee pain. Otherwise doing well. Does not feel she is ready to leave yet due to pain.   Objective: Vital signs in last 24 hours: Temp:  [98.1 F (36.7 C)-98.3 F (36.8 C)] 98.3 F (36.8 C) (07/15 0544) Pulse Rate:  [94-102] 102 (07/15 0544) Resp:  [16] 16 (07/15 0544) BP: (119-141)/(51-64) 119/51 mmHg (07/15 0544) SpO2:  [97 %-100 %] 99 % (07/15 0544)  Intake/Output from previous day:  Intake/Output Summary (Last 24 hours) at 11/06/15 0950 Last data filed at 11/06/15 9702  Gross per 24 hour  Intake 1335.83 ml  Output   1275 ml  Net  60.83 ml    Intake/Output this shift: Total I/O In: -  Out: 400 [Urine:400]  Labs: Results for orders placed or performed during the hospital encounter of 11/04/15  CBC  Result Value Ref Range   WBC 11.2 (H) 4.0 - 10.5 K/uL   RBC 4.08 3.87 - 5.11 MIL/uL   Hemoglobin 11.8 (L) 12.0 - 15.0 g/dL   HCT 35.2 (L) 36.0 - 46.0 %   MCV 86.3 78.0 - 100.0 fL   MCH 28.9 26.0 - 34.0 pg   MCHC 33.5 30.0 - 36.0 g/dL   RDW 13.4 11.5 - 15.5 %   Platelets 308 150 - 400 K/uL  Basic metabolic panel  Result Value Ref Range   Sodium 137 135 - 145 mmol/L   Potassium 3.1 (L) 3.5 - 5.1 mmol/L   Chloride 106 101 - 111 mmol/L   CO2 23 22 - 32 mmol/L   Glucose, Bld 126 (H) 65 - 99 mg/dL   BUN 22 (H) 6 - 20 mg/dL   Creatinine, Ser 0.98 0.44 - 1.00 mg/dL   Calcium 8.4 (L) 8.9 - 10.3 mg/dL   GFR calc non Af Amer 59 (L) >60 mL/min   GFR calc Af Amer >60 >60 mL/min   Anion gap 8 5 - 15  CBC  Result Value Ref Range   WBC 8.6 4.0 - 10.5 K/uL   RBC 3.98 3.87 - 5.11 MIL/uL   Hemoglobin 11.4 (L) 12.0 - 15.0 g/dL   HCT 34.7 (L) 36.0 - 46.0 %   MCV 87.2 78.0 - 100.0 fL   MCH 28.6 26.0 - 34.0 pg   MCHC 32.9 30.0 - 36.0 g/dL   RDW 13.0  11.5 - 15.5 %   Platelets 260 150 - 400 K/uL    Exam - Neurologically intact ABD soft Neurovascular intact Sensation intact distally Intact pulses distally Dorsiflexion/Plantar flexion intact Incision: dressing C/D/I and no drainage No cellulitis present Compartment soft no calf pain or sign of DVT Dressing/Incision - clean, dry, no drainage Motor function intact - moving foot and toes well on exam.   Assessment/Plan: 2 Days Post-Op Procedure(s) (LRB): RIGHT TOTAL KNEE ARTHROPLASTY (Right)  Advance diet Up with therapy D/C IV fluids Past Medical History  Diagnosis Date  . Ulcerative colitis [id number]  . Pulmonary embolus ([place]) 1971  . Phlebitis 1971  . Small bowel obstruction ([place]) [id number]  . Small bowel perforation ([place]) 1971/1996  . Large bowel perforation ([place]) 1971/1996  . Scoliosis   . Torn rotator cuff 2005  . History of bone density study 2012  . Fatigue   . Cataract     Left Eye  .  Hot flashes   . 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 Hypertension   . Cancer (HCC)     DCIS L breast  . H/O ulcerative colitis   . Breast cancer ([place])   . Arthritis     hands, lumbar spine, hips, right ankle  . S/P ileostomy ([place])   . S/P radiation therapy 07/24/11 - 09/06/11    Left Breast/ 5000 cGy in 25 Fractions with Boost of 1000 cGy in 5 Fractions  . 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 Abnormal finding on cardiovascular stress test, ischemia anterolateral 02/13/2012    follow heart cath - pt told one tiny blockage that didn't even matter  . Syncope, ? anginal equivilant 02/13/2012  . Ileostomy in place, secondary to ulcerative colitis x 20 years 02/13/2012  . History of recurrent UTIs 02/13/2012  . Sleep apnea 2009    uses cpap-setting is 1  . H/O nephrostomy 08/13/12    DVT Prophylaxis - Xarelto Protocol Weight-Bearing as tolerated to Right leg Plan D/C to [facility] due to pain today D/C summary done Seen by myself and Dr. Nash Shearer, [place]. 11/06/2015, 9:50 AM

## 2015-11-06 NOTE — Progress Notes (Signed)
CSW assisting with d/c planning. Pt is not stable for d/c today. [facility] has been updated. [place] has authorized dc to SNF on Sunday if stable.  Werner Lean LCSW [phone]

## 2015-11-07 LAB — URINALYSIS, ROUTINE W REFLEX MICROSCOPIC
Bilirubin Urine: NEGATIVE
Glucose, UA: NEGATIVE mg/dL
KETONES UR: NEGATIVE mg/dL
NITRITE: NEGATIVE
PROTEIN: NEGATIVE mg/dL
Specific Gravity, Urine: 1.019 (ref 1.005–1.030)
pH: 6 (ref 5.0–8.0)

## 2015-11-07 LAB — CBC
HCT: 34.2 % — ABNORMAL LOW (ref 36.0–46.0)
Hemoglobin: 11.5 g/dL — ABNORMAL LOW (ref 12.0–15.0)
MCH: 29 pg (ref 26.0–34.0)
MCHC: 33.6 g/dL (ref 30.0–36.0)
MCV: 86.1 fL (ref 78.0–100.0)
PLATELETS: 268 10*3/uL (ref 150–400)
RBC: 3.97 MIL/uL (ref 3.87–5.11)
RDW: 13.2 % (ref 11.5–15.5)
WBC: 8.8 10*3/uL (ref 4.0–10.5)

## 2015-11-07 LAB — URINE MICROSCOPIC-ADD ON

## 2015-11-07 MED ORDER — CIPROFLOXACIN HCL 500 MG PO TABS
500.0000 mg | ORAL_TABLET | Freq: Two times a day (BID) | ORAL | Status: DC
Start: 2015-11-07 — End: 2015-11-19

## 2015-11-07 MED ORDER — CIPROFLOXACIN HCL 500 MG PO TABS
500.0000 mg | ORAL_TABLET | Freq: Two times a day (BID) | ORAL | Status: DC
  Administered 2015-11-07: 500 mg via ORAL
  Filled 2015-11-07 (×2): qty 1

## 2015-11-07 NOTE — Progress Notes (Signed)
Physical Therapy Treatment Patient Details Name: Deanna Burke MRN: [id number] DOB: 1950-10-18 Today's Date: 11/07/2015    History of Present Illness Pt is a 65 year old female s/p R TKA with hx of L TKA, ileostomy, breast cancer    PT Comments    Pt progressing, needs to continue to focus on improving R knee flexion, pt in agreement;  will benefit from SNF level therapies post acute  Follow Up Recommendations  SNF     Equipment Recommendations  None recommended by PT    Recommendations for Other Services       Precautions / Restrictions Precautions Precautions: Fall;Knee Required Braces or Orthoses: Knee Immobilizer - Right Restrictions Weight Bearing Restrictions: No Other Position/Activity Restrictions: WBAT    Mobility  Bed Mobility Overal bed mobility: Needs Assistance Bed Mobility: Supine to Sit     Supine to sit: Min assist     General bed mobility comments: assist for R LE  Transfers Overall transfer level: Needs assistance Equipment used: Rolling walker (2 wheeled) Transfers: Sit to/from Stand Sit to Stand: Min assist         General transfer comment: verbal cues for UE and LE positioning, assist to rise  Ambulation/Gait Ambulation/Gait assistance: Min guard Ambulation Distance (Feet): 80 Feet Assistive device: Rolling walker (2 wheeled) Gait Pattern/deviations: Step-to pattern;Antalgic     General Gait Details: verbal cues for step length and posture   Stairs            Wheelchair Mobility    Modified Rankin (Stroke Patients Only)       Balance                                    Cognition Arousal/Alertness: Awake/alert Behavior During Therapy: WFL for tasks assessed/performed Overall Cognitive Status: Within Functional Limits for tasks assessed                      Exercises Total Joint Exercises Ankle Circles/Pumps: AROM;10 reps;Both Quad Sets: AROM;Both;10 reps Short Arc QuadSinclair Burke;Right;10 reps Heel Slides: AAROM;Right;10 reps (using contract relax) Hip ABduction/ADduction: AROM;15 reps;Right Straight Leg Raises: AAROM;Right;10 reps Goniometric ROM: ~8-60* AAROM    General Comments        Pertinent Vitals/Pain Pain Assessment: 0-10 Pain Score: 3  Pain Location: R knee Pain Descriptors / Indicators: Grimacing;Guarding;Sore Pain Intervention(s): Limited activity within patient's tolerance;Premedicated before session;Monitored during session;Ice applied    Home Living                      Prior Function            PT Goals (current goals can now be found in the care plan section) Acute Rehab PT Goals Patient Stated Goal: incr flexion L knee PT Goal Formulation: With patient Time For Goal Achievement: 11/09/15 Potential to Achieve Goals: Good Progress towards PT goals: Progressing toward goals    Frequency  7X/week    PT Plan Current plan remains appropriate    Co-evaluation             End of Session Equipment Utilized During Treatment: Right knee immobilizer Activity Tolerance: Patient tolerated treatment well;Patient limited by fatigue Patient left: in chair;with call bell/phone within reach;with chair alarm set     Time: 1010-1030 PT Time Calculation (min) (ACUTE ONLY): 20 min  Charges:  $Gait Training: 8-22 mins  G CodesKenyon Burke 11/07/2015, 10:34 AM

## 2015-11-07 NOTE — Clinical Social Work Placement (Signed)
   CLINICAL SOCIAL WORK PLACEMENT  NOTE  Date:  11/07/2015  Patient Details  Name: Deanna Burke MRN: [id number] Date of Birth: 24-Feb-1951  Clinical Social Work is seeking post-discharge placement for this patient at the [place] level of care (*CSW will initial, date and re-position this form in  chart as items are completed):  No   Patient/family provided with [place] Work Department's list of facilities offering this level of care within the geographic area requested by the patient (or if unable, by the patient's family).  Yes   Patient/family informed of their freedom to choose among providers that offer the needed level of care, that participate in Medicare, Medicaid or managed care program needed by the patient, have an available bed and are willing to accept the patient.  Yes   Patient/family informed of [facility]'s ownership interest in [facility] and [facility], as well as of the fact that they are under no obligation to receive care at these facilities.  PASRR submitted to EDS on       PASRR number received on       Existing PASRR number confirmed on 11/04/15     FL2 transmitted to all facilities in geographic area requested by pt/family on 11/04/15     FL2 transmitted to all facilities within larger geographic area on       Patient informed that his/her managed care company has contracts with or will negotiate with certain facilities, including the following:        Yes   Patient/family informed of bed offers received.  Patient chooses bed at [facility]     Physician recommends and patient chooses bed at      Patient to be transferred to [facility] on 11/07/15.  Patient to be transferred to facility by [facility]     Patient family notified on 11/07/15 of transfer.  Name of family member notified:  Jane     PHYSICIAN Please sign FL2     Additional Comment:     _______________________________________________ Cranford Mon, LCSW 11/07/2015, 11:22 AM

## 2015-11-07 NOTE — Progress Notes (Signed)
Patient will discharge to [facility] Anticipated discharge date: 7/16 Family notified: pt notified family of DC Transportation by family  CSW signing off.  Domenica Reamer, [place] Social Worker [phone]

## 2015-11-07 NOTE — Discharge Summary (Signed)
Physician Discharge Summary   Patient ID: Deanna Burke MRN: [id number] DOB/AGE: [birth year] 67 y.o.  Admit date: 11/04/2015 Discharge date: 11/07/2015  Admission Diagnoses:  Principal Problem:   Primary osteoarthritis of right knee Active Problems:   Right knee DJD   Discharge Diagnoses:  Same Urinary tract infection   Surgeries: Procedure(s): RIGHT TOTAL KNEE ARTHROPLASTY on 11/04/2015   Consultants: PT/OT  Discharged Condition: Stable  Hospital Course: Deanna Burke is an 65 y.o. female who was admitted 11/04/2015 with a chief complaint of right knee pain and swelling, and found to have a diagnosis of Primary osteoarthritis of right knee.  They were brought to the operating room on 11/04/2015 and underwent the above named procedures.    The patient had an uncomplicated hospital course and was stable for discharge.  Recent vital signs:  Filed Vitals:   11/06/15 2059 11/07/15 0552  BP: 123/58 113/63  Pulse: 103 102  Temp: 99.1 F (37.3 C) 98.3 F (36.8 C)  Resp: 16 14    Recent laboratory studies:  Results for orders placed or performed during the hospital encounter of 11/04/15  CBC  Result Value Ref Range   WBC 11.2 (H) 4.0 - 10.5 K/uL   RBC 4.08 3.87 - 5.11 MIL/uL   Hemoglobin 11.8 (L) 12.0 - 15.0 g/dL   HCT 35.2 (L) 36.0 - 46.0 %   MCV 86.3 78.0 - 100.0 fL   MCH 28.9 26.0 - 34.0 pg   MCHC 33.5 30.0 - 36.0 g/dL   RDW 13.4 11.5 - 15.5 %   Platelets 308 150 - 400 K/uL  Basic metabolic panel  Result Value Ref Range   Sodium 137 135 - 145 mmol/L   Potassium 3.1 (L) 3.5 - 5.1 mmol/L   Chloride 106 101 - 111 mmol/L   CO2 23 22 - 32 mmol/L   Glucose, Bld 126 (H) 65 - 99 mg/dL   BUN 22 (H) 6 - 20 mg/dL   Creatinine, Ser 0.98 0.44 - 1.00 mg/dL   Calcium 8.4 (L) 8.9 - 10.3 mg/dL   GFR calc non Af Amer 59 (L) >60 mL/min   GFR calc Af Amer >60 >60 mL/min   Anion gap 8 5 - 15  CBC  Result Value Ref Range   WBC 8.6 4.0 - 10.5 K/uL   RBC 3.98 3.87 - 5.11  MIL/uL   Hemoglobin 11.4 (L) 12.0 - 15.0 g/dL   HCT 34.7 (L) 36.0 - 46.0 %   MCV 87.2 78.0 - 100.0 fL   MCH 28.6 26.0 - 34.0 pg   MCHC 32.9 30.0 - 36.0 g/dL   RDW 13.0 11.5 - 15.5 %   Platelets 260 150 - 400 K/uL  CBC  Result Value Ref Range   WBC 8.8 4.0 - 10.5 K/uL   RBC 3.97 3.87 - 5.11 MIL/uL   Hemoglobin 11.5 (L) 12.0 - 15.0 g/dL   HCT 34.2 (L) 36.0 - 46.0 %   MCV 86.1 78.0 - 100.0 fL   MCH 29.0 26.0 - 34.0 pg   MCHC 33.6 30.0 - 36.0 g/dL   RDW 13.2 11.5 - 15.5 %   Platelets 268 150 - 400 K/uL  Urinalysis, Routine w reflex microscopic (not at [facility])  Result Value Ref Range   Color, Urine YELLOW YELLOW   APPearance CLOUDY (A) CLEAR   Specific Gravity, Urine 1.019 1.005 - 1.030   pH 6.0 5.0 - 8.0   Glucose, UA NEGATIVE NEGATIVE mg/dL   Hgb urine dipstick LARGE (A)  NEGATIVE   Bilirubin Urine NEGATIVE NEGATIVE   Ketones, ur NEGATIVE NEGATIVE mg/dL   Protein, ur NEGATIVE NEGATIVE mg/dL   Nitrite NEGATIVE NEGATIVE   Leukocytes, UA MODERATE (A) NEGATIVE  Urine microscopic-add on  Result Value Ref Range   Squamous Epithelial / LPF 0-5 (A) NONE SEEN   WBC, UA 6-30 0 - 5 WBC/hpf   RBC / HPF 6-30 0 - 5 RBC/hpf   Bacteria, UA FEW (A) NONE SEEN    Discharge Medications:     Medication List    STOP taking these medications        aspirin 81 MG tablet     CERTAVITE SENIOR/ANTIOXIDANT PO      TAKE these medications        acetaminophen 500 MG tablet  Commonly known as:  TYLENOL  Take 1,000 mg by mouth every 6 (six) hours as needed for moderate pain.     Bismuth Subgallate 200 MG Chew  Chew 400 mg by mouth 4 (four) times daily -  with meals and at bedtime.     cholecalciferol 1000 units tablet  Commonly known as:  VITAMIN D  Take 1,000 Units by mouth daily.     ciprofloxacin 500 MG tablet  Commonly known as:  CIPRO  Take 1 tablet (500 mg total) by mouth 2 (two) times daily.     docusate sodium 100 MG capsule  Commonly known as:  COLACE  Take 1 capsule (100 mg  total) by mouth 2 (two) times daily as needed for mild constipation.     hydrOXYzine 25 MG tablet  Commonly known as:  ATARAX/VISTARIL  Take 25 mg by mouth at bedtime.     OVER THE COUNTER MEDICATION  Take 1 capsule by mouth daily. Probiotic     oxybutynin 5 MG tablet  Commonly known as:  DITROPAN  Take 5 mg by mouth every 8 (eight) hours as needed for bladder spasms.     oxyCODONE-acetaminophen 5-325 MG tablet  Commonly known as:  PERCOCET  Take 1-2 tablets by mouth every 4 (four) hours as needed for severe pain.     polyethylene glycol packet  Commonly known as:  MIRALAX / GLYCOLAX  Take 17 g by mouth daily.     potassium chloride SA 20 MEQ tablet  Commonly known as:  K-DUR,KLOR-CON  Take 1 tablet (20 mEq total) by mouth daily.     rivaroxaban 10 MG Tabs tablet  Commonly known as:  XARELTO  Take 1 tablet (10 mg total) by mouth daily.     tamoxifen 20 MG tablet  Commonly known as:  NOLVADEX  Take 1 tablet (20 mg total) by mouth daily.     triamterene-hydrochlorothiazide 37.5-25 MG tablet  Commonly known as:  MAXZIDE-25  Take 1 tablet by mouth every morning.        Diagnostic Studies: Dg Knee 1-2 Views Right  10/28/2015  CLINICAL DATA:  Preop for right knee surgery, diffuse right knee pain, no injury EXAM: RIGHT KNEE - 1-2 VIEW COMPARISON:  None. FINDINGS: There is tricompartmental degenerative joint disease the right knee. There is considerable loss of joint space particularly laterally with almost complete loss of joint space with sclerosis and spurring present. There does appear to be faint chondrocalcinosis, and CPPD arthropathy is a consideration. No joint effusion is seen. No fracture is noted. IMPRESSION: Significant tricompartmental degenerative joint disease primarily involving the lateral and patellofemoral compartments. No fracture or effusion. Faint chondrocalcinosis -question CPPD. Electronically Signed   By: Eddie Dibbles  Alvester Chou M.D.   On: 10/28/2015 13:05   Dg Knee  Right Port  11/04/2015  CLINICAL DATA:  Status post right knee replacement EXAM: PORTABLE RIGHT KNEE - 1-2 VIEW COMPARISON:  10/28/2015 FINDINGS: Right knee prosthesis is now seen. Air is noted in the surgical bed. No acute bony abnormality is seen. IMPRESSION: Status post right knee replacement. Electronically Signed   By: Inez Catalina M.D.   On: 11/04/2015 09:52    Disposition: 03-Skilled Nursing Facility      Discharge Instructions    Call MD / Call 911    Complete by:  As directed   If you experience chest pain or shortness of breath, CALL 911 and be transported to the hospital emergency room.  If you develope a fever above 101 F, pus (white drainage) or increased drainage or redness at the wound, or calf pain, call your surgeon's office.     Constipation Prevention    Complete by:  As directed   Drink plenty of fluids.  Prune juice may be helpful.  You may use a stool softener, such as Colace (over the counter) 100 mg twice a day.  Use MiraLax (over the counter) for constipation as needed.     Diet - low sodium heart healthy    Complete by:  As directed      Increase activity slowly as tolerated    Complete by:  As directed            Follow-up Information    Follow up with BEANE,JEFFREY C, MD In 2 weeks.   Specialty:  Orthopedic Surgery   Contact information:   [address] [place] [address] [phone]       Follow [facility] with [facility] .   Specialty:  [place] information:   [place] [phone]       Signed: Ventura Bruns 11/07/2015, 11:09 AM

## 2015-11-07 NOTE — Progress Notes (Signed)
Pt's vitals are WNL, tolerating diet and pain is under control. Discussed discharge instructions with patient. Pt discharged to [facility] via private vehicle with prescriptions. Patient's home med was returned to her from pharmacy and signed sheet placed back into shadow chart. Merla Riches, PA to fax over another prescription to [facility] for ABX d/t pt's pharmacy being closed today. Brad ok with pt missing one dose tonight.

## 2015-11-07 NOTE — Progress Notes (Signed)
   Subjective: 3 Days Post-Op Procedure(s) (LRB): RIGHT TOTAL KNEE ARTHROPLASTY (Right)  Mild to moderate soreness in the knee this morning Pt concerned about possible UTI today  Plan for d/c to SNF later today as long as urine is clear Patient reports pain as moderate.  Objective:   VITALS:   Filed Vitals:   11/06/15 2059 11/07/15 0552  BP: 123/58 113/63  Pulse: 103 102  Temp: 99.1 F (37.3 C) 98.3 F (36.8 C)  Resp: 16 14    Right knee incision healing well nv intact distally No rashes or edema Stiffness with rom  LABS  Recent Labs  11/05/15 0414 11/06/15 0400 11/07/15 0413  HGB 11.8* 11.4* 11.5*  HCT 35.2* 34.7* 34.2*  WBC 11.2* 8.6 8.8  PLT 308 260 268     Recent Labs  11/05/15 0414  NA 137  K 3.1*  BUN 22*  CREATININE 0.98  GLUCOSE 126*     Assessment/Plan: 3 Days Post-Op Procedure(s) (LRB): RIGHT TOTAL KNEE ARTHROPLASTY (Right) Will obtain urine today If urine clear plan for d/c to SNF later today F/u in 2 weeks Pain management as needed    Merla Riches, MPAS, PA-C  11/07/2015, 7:52 AM

## 2015-11-08 ENCOUNTER — Non-Acute Institutional Stay (SKILLED_NURSING_FACILITY): Payer: Medicare Other | Admitting: Adult Health

## 2015-11-08 ENCOUNTER — Encounter: Payer: Self-pay | Admitting: Adult Health

## 2015-11-08 DIAGNOSIS — D62 Acute posthemorrhagic anemia: Secondary | ICD-10-CM | POA: Diagnosis not present

## 2015-11-08 DIAGNOSIS — N3289 Other specified disorders of bladder: Secondary | ICD-10-CM

## 2015-11-08 DIAGNOSIS — E876 Hypokalemia: Secondary | ICD-10-CM

## 2015-11-08 DIAGNOSIS — M1711 Unilateral primary osteoarthritis, right knee: Secondary | ICD-10-CM | POA: Diagnosis not present

## 2015-11-08 DIAGNOSIS — N390 Urinary tract infection, site not specified: Secondary | ICD-10-CM

## 2015-11-08 DIAGNOSIS — I10 Essential (primary) hypertension: Secondary | ICD-10-CM

## 2015-11-08 DIAGNOSIS — R2681 Unsteadiness on feet: Secondary | ICD-10-CM | POA: Diagnosis not present

## 2015-11-08 DIAGNOSIS — D0512 Intraductal carcinoma in situ of left breast: Secondary | ICD-10-CM | POA: Diagnosis not present

## 2015-11-08 DIAGNOSIS — Z932 Ileostomy status: Secondary | ICD-10-CM | POA: Diagnosis not present

## 2015-11-08 DIAGNOSIS — K5900 Constipation, unspecified: Secondary | ICD-10-CM | POA: Diagnosis not present

## 2015-11-08 NOTE — Progress Notes (Signed)
Patient ID: Deanna Burke, female   DOB: 06-22-1950, 65 y.o.   MRN: [id number]    DATE:   11/08/15  MRN:  [id number]  BIRTHDAY: [birth year]  Facility:  [facility] Home Location:  [place] Room Number: 606-P  LEVEL OF CARE:  SNF (31)  Contact Information    Name Relation Home Work [place] Brother [phone]  [phone]   Urich,Doris Mother [phone]         Code Status History    Date Active Date Inactive Code Status Order ID Comments User Context   11/04/2015 11:18 AM 11/07/2015  6:00 PM Full Code [id number]  Susa Day, MD Inpatient   04/01/2015 11:32 AM 04/05/2015  6:42 PM Full Code [id number]  Susa Day, MD Inpatient   12/30/2013  5:49 PM 01/05/2014  1:46 PM Full Code [id number]  Excell Seltzer, MD Inpatient   06/05/2012  7:33 PM 06/15/2012  1:57 PM Full Code [id number]  Janece Canterbury, MD ED   04/17/2012  1:23 PM 04/23/2012  6:45 PM Full Code [id number]  Lisabeth Pick Ward Scronce, RN ED   08/03/2011  8:54 PM 08/13/2011  4:57 PM Full Code [id number]  Earnstine Regal, MD ED       Chief Complaint  Patient presents with  . Hospitalization Follow-up    HISTORY OF PRESENT ILLNESS:   This is a 65 year old female who has been admitted to [facility] on 11/07/15 from [facility] with right knee osteoarthritis for which she had right total knee arthroplasty on 11/04/15. She was seen today in her room and requested Robaxin PRN. She has ileostomy and has requested for her Miralax to be PRN.  She has been admitted for a short-term rehabilitation.   PAST MEDICAL HISTORY:  Past Medical History  Diagnosis Date  . Ulcerative colitis [phone]  . Pulmonary embolus ([place]) 1971  . Phlebitis 1971  . Small bowel obstruction ([place]) [id number]  . Small bowel perforation ([place]) 1971/1996  . Large bowel perforation ([place]) 1971/1996  . Scoliosis   . Torn rotator cuff 2005  . History of bone density study 2012  . Fatigue   . Cataract      Left Eye  . Hot flashes   . 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 Hypertension   . Cancer (HCC)     DCIS L breast  . H/O ulcerative colitis   . Breast cancer ([place])   . Arthritis     hands, lumbar spine, hips, right ankle  . S/P ileostomy ([place])   . S/P radiation therapy 07/24/11 - 09/06/11    Left Breast/ 5000 cGy in 25 Fractions with Boost of 1000 cGy in 5 Fractions  . 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 Abnormal finding on cardiovascular stress test, ischemia anterolateral 02/13/2012    follow heart cath - pt told one tiny blockage that didn't even matter  . Syncope, ? anginal equivilant 02/13/2012  . Ileostomy in place, secondary to ulcerative colitis x 20 years 02/13/2012  . History of recurrent UTIs 02/13/2012  . Sleep apnea 2009    uses cpap-setting is 1  . H/O nephrostomy 08/13/12     CURRENT MEDICATIONS: Reviewed  Patient's Medications  New Prescriptions   No medications on file  Previous Medications   ACETAMINOPHEN (TYLENOL) 500 MG TABLET    Take 1,000 mg by mouth every  6 (six) hours as needed for moderate pain.   BISMUTH SUBGALLATE 200 MG CHEW    Chew 400 mg by mouth 4 (four) times daily -  with meals and at bedtime.   CHOLECALCIFEROL (VITAMIN D) 1000 UNITS TABLET    Take 1,000 Units by mouth daily.   CIPROFLOXACIN (CIPRO) 500 MG TABLET    Take 1 tablet (500 mg total) by mouth 2 (two) times daily.   DOCUSATE SODIUM (COLACE) 100 MG CAPSULE    Take 1 capsule (100 mg total) by mouth 2 (two) times daily as needed for mild constipation.   HYDROXYZINE (ATARAX/VISTARIL) 25 MG TABLET    Take 25 mg by mouth at bedtime.   OXYBUTYNIN (DITROPAN) 5 MG TABLET    Take 5 mg by mouth every 8 (eight) hours as needed for bladder spasms.    OXYCODONE-ACETAMINOPHEN (PERCOCET) 5-325 MG TABLET    Take 1-2 tablets by mouth every 4 (four) hours as needed for severe pain.   POLYETHYLENE GLYCOL (MIRALAX /  GLYCOLAX) PACKET    Take 17 g by mouth daily.   POTASSIUM CHLORIDE SA (K-DUR,KLOR-CON) 20 MEQ TABLET    Take 1 tablet (20 mEq total) by mouth daily.   RIVAROXABAN (XARELTO) 10 MG TABS TABLET    Take 1 tablet (10 mg total) by mouth daily.   SACCHAROMYCES BOULARDII (FLORASTOR) 250 MG CAPSULE    Take 250 mg by mouth daily.   TAMOXIFEN (NOLVADEX) 20 MG TABLET    Take 1 tablet (20 mg total) by mouth daily.   TRIAMTERENE-HYDROCHLOROTHIAZIDE (MAXZIDE-25) 37.5-25 MG PER TABLET    Take 1 tablet by mouth every morning.  Modified Medications   No medications on file  Discontinued Medications   MULTIPLE VITAMINS-MINERALS (CERTAVITE/ANTIOXIDANTS) TABS    Take 1 tablet by mouth daily.   OVER THE COUNTER MEDICATION    Take 1 capsule by mouth daily. Probiotic     Allergies  Allergen Reactions  . Demerol Nausea And Vomiting  . Dilaudid [Hydromorphone Hcl] Nausea And Vomiting and Other (See Comments)    Muscle cramping  . Reglan [Metoclopramide] Other (See Comments)    Cramps in hands  . Stadol [Butorphanol Tartrate] Nausea And Vomiting    Injection  . Cephalexin Rash  . Penicillins Rash    Rash only; note also rash to Keflex Has patient had a PCN reaction causing immediate rash, facial/tongue/throat swelling, SOB or lightheadedness with hypotension: rash Has patient had a PCN reaction causing severe rash involving mucus membranes or skin necrosis: no Has patient had a PCN reaction that required hospitalization no Has patient had a PCN reaction occurring within the last 10 years: no If all of the above answers are "NO", then may proceed with Cephalosporin use.     REVIEW OF SYSTEMS:  GENERAL: no change in appetite, no fatigue, no weight changes, no fever, chills or weakness EYES: Denies change in vision, dry eyes, eye pain, itching or discharge EARS: Denies change in hearing, ringing in ears, or earache NOSE: Denies nasal congestion or epistaxis MOUTH and THROAT: Denies oral discomfort,  gingival pain or bleeding, pain from teeth or hoarseness   RESPIRATORY: no cough, SOB, DOE, wheezing, hemoptysis CARDIAC: no chest pain, edema or palpitations GI: no abdominal pain, diarrhea, constipation, heart burn, nausea or vomiting GU: Denies dysuria, frequency, hematuria, incontinence, or discharge PSYCHIATRIC: Denies feeling of depression or anxiety. No report of hallucinations, insomnia, paranoia, or agitation   PHYSICAL EXAMINATION  GENERAL APPEARANCE: Well nourished. In no acute distress. Normal body habitus  SKIN:  Right surgical knee surgical incision is covered with aquacel dressing HEAD: Normal in size and contour. No evidence of trauma EYES: Lids open and close normally. No blepharitis, entropion or ectropion. PERRL. Conjunctivae are clear and sclerae are white. Lenses are without opacity EARS: Pinnae are normal. Patient hears normal voice tunes of the examiner MOUTH and THROAT: Lips are without lesions. Oral mucosa is moist and without lesions. Tongue is normal in shape, size, and color and without lesions NECK: supple, trachea midline, no neck masses, no thyroid tenderness, no thyromegaly LYMPHATICS: no LAN in the neck, no supraclavicular LAN RESPIRATORY: breathing is even & unlabored, BS CTAB CARDIAC: RRR, no murmur,no extra heart sounds, right knee edema 1+ GI: abdomen soft, normal BS, no masses, no tenderness, no hepatomegaly, no splenomegaly,  + ileostomy EXTREMITIES:  Able to move X 4 extremities PSYCHIATRIC: Alert and oriented X 3. Affect and behavior are appropriate  LABS/RADIOLOGY: Labs reviewed: Basic Metabolic Panel:  Recent Labs  04/04/15 0530 05/03/15 1402 10/28/15 1230 11/05/15 0414  NA 136 140 139 137  K 3.0* 4.0 3.7 3.1*  CL 104  --  104 106  CO2 24 25 27 23   GLUCOSE 120* 96 96 126*  BUN 21* 20.6 30* 22*  CREATININE 0.99 1.5* 1.28* 0.98  CALCIUM 8.1* 10.1 10.1 8.4*   Liver Function Tests:  Recent Labs  05/03/15 1402  AST 17  ALT <9   ALKPHOS 77  BILITOT 0.59  PROT 7.7  ALBUMIN 3.6   CBC:  Recent Labs  05/03/15 1402  11/05/15 0414 11/06/15 0400 11/07/15 0413  WBC 7.6  < > 11.2* 8.6 8.8  NEUTROABS 5.9  --   --   --   --   HGB 12.2  < > 11.8* 11.4* 11.5*  HCT 37.6  < > 35.2* 34.7* 34.2*  MCV 89.7  < > 86.3 87.2 86.1  PLT 471*  < > 308 260 268  < > = values in this interval not displayed.   Dg Knee 1-2 Views Right  10/28/2015  CLINICAL DATA:  Preop for right knee surgery, diffuse right knee pain, no injury EXAM: RIGHT KNEE - 1-2 VIEW COMPARISON:  None. FINDINGS: There is tricompartmental degenerative joint disease the right knee. There is considerable loss of joint space particularly laterally with almost complete loss of joint space with sclerosis and spurring present. There does appear to be faint chondrocalcinosis, and CPPD arthropathy is a consideration. No joint effusion is seen. No fracture is noted. IMPRESSION: Significant tricompartmental degenerative joint disease primarily involving the lateral and patellofemoral compartments. No fracture or effusion. Faint chondrocalcinosis -question CPPD. Electronically Signed   By: Ivar Drape M.D.   On: 10/28/2015 13:05   Dg Knee Right Port  11/04/2015  CLINICAL DATA:  Status post right knee replacement EXAM: PORTABLE RIGHT KNEE - 1-2 VIEW COMPARISON:  10/28/2015 FINDINGS: Right knee prosthesis is now seen. Air is noted in the surgical bed. No acute bony abnormality is seen. IMPRESSION: Status post right knee replacement. Electronically Signed   By: Inez Catalina M.D.   On: 11/04/2015 09:52    ASSESSMENT/PLAN:  Unsteady gait - for rehabilitation, PT and OT  Right knee osteoarthritis S/P right total knee arthroplasty - for rehabilitation; PT and OT; start Robaxin 500 mg 1 tab by mouth every 6 hours when necessary for muscle spasm; acetaminophen 500 mg take 2 tabs = 1000 mg by mouth every 6 hours when necessary and Percocet 5/325 mg 1-2 tabs by mouth every 4  hours when  necessary for pain; Xarelto 10 mg 1 tab by mouth daily for DVT prophylaxis; follow-up with Dr. Susa Day, orthopedic surgeon in 2 weeks  Ileostomy in place secondary to ulcerative colitis X 20 years - continue bismuth subgaleal gaitsubgallate 200 mg Take 2 tabs = 400 mg by mouth 4 times a day with meals and at bedtime   UTI - continue Cipro 500 mg 1 tab by mouth twice a day and Florastor 250 mg 1 capsule daily  Bladder spasm - continue oxybutynin 5 mg 1 tab by mouth every 8 hours when necessary  Constipation - change MiraLAX 17 g by mouth daily when necessary and Colace 100 mg 1 capsule by mouth twice a day when necessary  Hypokalemia - continue KCL ER 20 meq 1 tab by mouth daily; check CMP Lab Results  Component Value Date   K 3.1* 11/05/2015   Left breast cancer - continue tamoxifen 20 mg 1 tab by mouth daily  Hypertension - continue Maxzide 25 one tab by mouth every a.m.  Anemia, acute blood loss - check CBC Lab Results  Component Value Date   HGB 11.5* 11/07/2015       Goals of care:  Short-term rehabilitation     Durenda Age, NP [facility] [phone]

## 2015-11-09 ENCOUNTER — Encounter: Payer: Self-pay | Admitting: Internal Medicine

## 2015-11-09 ENCOUNTER — Non-Acute Institutional Stay (SKILLED_NURSING_FACILITY): Payer: Medicare Other | Admitting: Internal Medicine

## 2015-11-09 DIAGNOSIS — N390 Urinary tract infection, site not specified: Secondary | ICD-10-CM

## 2015-11-09 DIAGNOSIS — R2681 Unsteadiness on feet: Secondary | ICD-10-CM

## 2015-11-09 DIAGNOSIS — D62 Acute posthemorrhagic anemia: Secondary | ICD-10-CM | POA: Diagnosis not present

## 2015-11-09 DIAGNOSIS — Z9889 Other specified postprocedural states: Secondary | ICD-10-CM

## 2015-11-09 DIAGNOSIS — K5900 Constipation, unspecified: Secondary | ICD-10-CM

## 2015-11-09 DIAGNOSIS — I10 Essential (primary) hypertension: Secondary | ICD-10-CM

## 2015-11-09 DIAGNOSIS — M1711 Unilateral primary osteoarthritis, right knee: Secondary | ICD-10-CM

## 2015-11-09 DIAGNOSIS — E876 Hypokalemia: Secondary | ICD-10-CM | POA: Diagnosis not present

## 2015-11-09 DIAGNOSIS — N3289 Other specified disorders of bladder: Secondary | ICD-10-CM

## 2015-11-09 LAB — CBC AND DIFFERENTIAL
HEMATOCRIT: 34 % — AB (ref 36–46)
Hemoglobin: 11.4 g/dL — AB (ref 12.0–16.0)
Platelets: 340 10*3/uL (ref 150–399)
WBC: 5.6 10^3/mL

## 2015-11-09 LAB — HEPATIC FUNCTION PANEL
ALK PHOS: 77 U/L (ref 25–125)
ALT: 18 U/L (ref 7–35)
AST: 26 U/L (ref 13–35)
Bilirubin, Total: 1.1 mg/dL

## 2015-11-09 LAB — BASIC METABOLIC PANEL
BUN: 22 mg/dL — AB (ref 4–21)
CREATININE: 1.1 mg/dL (ref 0.5–1.1)
Glucose: 120 mg/dL
Potassium: 3.3 mmol/L — AB (ref 3.4–5.3)
SODIUM: 133 mmol/L — AB (ref 137–147)

## 2015-11-09 NOTE — Progress Notes (Signed)
LOCATION: [place]  PCP: [name], MD   Code Status: Full Code  Goals of care: Advanced Directive information Advanced Directives 11/04/2015  Does patient have an advance directive? Yes  Type of Advance Directive Living will  Does patient want to make changes to advanced directive? No - Patient declined  Copy of advanced directive(s) in chart? No - copy requested       Extended Emergency Contact Information Primary Emergency Contact: Bittinger,Harold Address: [address] [place], [place] [address] [place] Phone: [phone] Mobile Phone: [phone] Relation: Brother Secondary Emergency Contact: Ford,Doris Address: [phone] [place]          [place], [place] [address] [place] Phone: [phone] Relation: Mother   Allergies  Allergen Reactions  . Demerol Nausea And Vomiting  . Dilaudid [Hydromorphone Hcl] Nausea And Vomiting and Other (See Comments)    Muscle cramping  . Reglan [Metoclopramide] Other (See Comments)    Cramps in hands  . Stadol [Butorphanol Tartrate] Nausea And Vomiting    Injection  . Cephalexin Rash  . Penicillins Rash    Rash only; note also rash to Keflex Has patient had a PCN reaction causing immediate rash, facial/tongue/throat swelling, SOB or lightheadedness with hypotension: rash Has patient had a PCN reaction causing severe rash involving mucus membranes or skin necrosis: no Has patient had a PCN reaction that required hospitalization no Has patient had a PCN reaction occurring within the last 10 years: no If all of the above answers are "NO", then may proceed with Cephalosporin use.    Chief Complaint  Patient presents with  . New Admit To SNF    New Admission     HPI:  Patient is a 65 y.o. female seen today for short term rehabilitation post hospital admission from 11/04/15-11/07/15 with right knee OA. She underwent right total knee arthroplasty. She is seen in  her room today. Pain medication has been helpful.   Review of Systems:  Constitutional: Negative for fever, chills, diaphoresis. Energy level is slowly coming back.  HENT: Negative for headache, congestion, nasal discharge, difficulty swallowing.   Eyes: Negative for blurred vision, double vision and discharge. Wears glasses. Respiratory: Negative for cough, shortness of breath and wheezing.   Cardiovascular: Negative for chest pain, palpitations, leg swelling.  Gastrointestinal: Negative for heartburn, nausea, vomiting, abdominal pain and constipation. She is s/p ileostomy.  Genitourinary: Negative for dysuria and flank pain.  Musculoskeletal: Negative for back pain, fall in the facility.  Skin: Negative for itching, rash.  Neurological: Negative for dizziness. Psychiatric/Behavioral: Negative for depression   Past Medical History  Diagnosis Date  . Ulcerative colitis [phone]  . Pulmonary embolus ([place]) 1971  . Phlebitis 1971  . Small bowel obstruction ([place]) [id number]  . Small bowel perforation ([place]) 1971/1996  . Large bowel perforation ([place]) 1971/1996  . Scoliosis   . Torn rotator cuff 2005  . History of bone density study 2012  . Fatigue   . Cataract     Left Eye  . Hot flashes   . 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 Hypertension   . Cancer (HCC)     DCIS L breast  . H/O ulcerative colitis   . Breast cancer ([place])   . Arthritis     hands, lumbar spine, hips, right ankle  . S/P ileostomy ([place])   .  S/P radiation therapy 07/24/11 - 09/06/11    Left Breast/ 5000 cGy in 25 Fractions with Boost of 1000 cGy in 5 Fractions  . 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 Abnormal finding on cardiovascular stress test, ischemia anterolateral 02/13/2012    follow heart cath - pt told one tiny blockage that didn't even matter  . Syncope, ? anginal equivilant 02/13/2012  . Ileostomy in place,  secondary to ulcerative colitis x 20 years 02/13/2012  . History of recurrent UTIs 02/13/2012  . Sleep apnea 2009    uses cpap-setting is 1  . H/O nephrostomy 08/13/12   Past Surgical History  Procedure Laterality Date  . Colectomy  1973  . Ileostomy  1973  . Vaginoplasty  1975  . Exploratory laparotomy  1978/1990    with lysis of adhesions  . Eye surgery  05/08/2011    left cataract removal   . Appendectomy    . Rotator cuff repair  2006    Right  . Breast surgery  05/03/11    LEFT BREAST NEEDLE CORE BIOPSY- DCIS  . 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  ER:+, PR+,   . Abdominal hysterectomy  1996    partial  . Exploratory laps      several due to abd pain related to ulcerative colitis  . Cardiac catheterization    . Laparoscopic nephrectomy Right 08/14/2012    Procedure: RIGHT LAPAROSCOPIC RETROPERITONEAL LAPAROSCOPIC NEPHRECTOMY ;  Surgeon: Alexis Frock, MD;  Location: [facility];  Service: Urology;  Laterality: Right;  RIGHT LAPAROSCOPIC RETROPERITONEAL LAPAROSCOPIC NEPHRECTOMY, POSSIBLE OPEN   . Left heart catheterization with coronary angiogram N/A 02/13/2012    Procedure: LEFT HEART CATHETERIZATION WITH CORONARY ANGIOGRAM;  Surgeon: Lorretta Harp, MD;  Location: [facility] C[facility] LAB;  Service: Cardiovascular;  Laterality: N/A;  . Total knee arthroplasty Left 04/01/2015    Procedure: LEFT TOTAL KNEE ARTHROPLASTY;  Surgeon: Susa Day, MD;  Location: [facility];  Service: Orthopedics;  Laterality: Left;  . Total knee arthroplasty Right 11/04/2015    Procedure: RIGHT TOTAL KNEE ARTHROPLASTY;  Surgeon: Susa Day, MD;  Location: [facility];  Service: Orthopedics;  Laterality: Right;   Social History:   reports that she has never smoked. She has never used smokeless tobacco. She reports that she does not drink alcohol or use illicit drugs.  Family History  Problem Relation Age of Onset  .  Breast cancer Maternal Grandmother   . Stomach cancer Maternal Grandfather   . Cancer Paternal Grandmother     stomach  . Cancer Other     Breast  . Atrial fibrillation Mother   . Heart attack Father   . Atrial fibrillation Brother   . Heart attack Paternal Uncle     Medications:   Medication List       This list is accurate as of: 11/09/15  3:14 PM.  Always use your most recent med list.               acetaminophen 500 MG tablet  Commonly known as:  TYLENOL  Take 1,000 mg by mouth every 6 (six) hours as needed for moderate pain.     Bismuth Subgallate 200 MG Chew  Chew 400 mg by mouth 4 (four) times daily -  with meals and at bedtime.     cholecalciferol 1000 units tablet  Commonly known as:  VITAMIN D  Take 1,000 Units by mouth daily.     ciprofloxacin 500 MG tablet  Commonly known as:  CIPRO  Take 1 tablet (500 mg total) by mouth 2 (two) times daily.     docusate sodium 100 MG capsule  Commonly known as:  COLACE  Take 1 capsule (100 mg total) by mouth 2 (two) times daily as needed for mild constipation.     hydrOXYzine 25 MG tablet  Commonly known as:  ATARAX/VISTARIL  Take 25 mg by mouth at bedtime.     multivitamin tablet  Take 1 tablet by mouth daily.     oxybutynin 5 MG tablet  Commonly known as:  DITROPAN  Take 5 mg by mouth every 8 (eight) hours as needed for bladder spasms.     oxyCODONE-acetaminophen 5-325 MG tablet  Commonly known as:  PERCOCET  Take 1-2 tablets by mouth every 4 (four) hours as needed for severe pain.     polyethylene glycol packet  Commonly known as:  MIRALAX / GLYCOLAX  Take 17 g by mouth daily as needed.     potassium chloride SA 20 MEQ tablet  Commonly known as:  K-DUR,KLOR-CON  Take 1 tablet (20 mEq total) by mouth daily.     rivaroxaban 10 MG Tabs tablet  Commonly known as:  XARELTO  Take 1 tablet (10 mg total) by mouth daily.     saccharomyces boulardii 250 MG capsule  Commonly known as:  FLORASTOR  Take 250 mg  by mouth daily.     tamoxifen 20 MG tablet  Commonly known as:  NOLVADEX  Take 1 tablet (20 mg total) by mouth daily.     triamterene-hydrochlorothiazide 37.5-25 MG tablet  Commonly known as:  MAXZIDE-25  Take 1 tablet by mouth every morning.        Immunizations: Immunization History  Administered Date(s) Administered  . Influenza Split 01/23/2012     Physical Exam: Filed Vitals:   11/09/15 1510  BP: 127/66  Pulse: 118  Temp: 100.2 F (37.9 C)  TempSrc: Oral  Resp: 18  Height: 5' 4"  (1.626 m)  Weight: 171 lb (77.565 kg)  SpO2: 96%   Body mass index is 29.34 kg/(m^2).  General- elderly female, overweight, in no acute distress Head- normocephalic, atraumatic Nose- no nasal discharge Throat- moist mucus membrane Eyes- PERRLA, EOMI, no pallor, no icterus, no discharge, normal conjunctiva, normal sclera Neck- no cervical lymphadenopathy Cardiovascular- normal s1,s2, no murmur, + leg edema Respiratory- bilateral clear to auscultation, no wheeze, no rhonchi, no crackles, no use of accessory muscles Abdomen- bowel sounds present, soft, non tender Musculoskeletal- able to move all 4 extremities, limited right knee range of motion Neurological- alert and oriented to person, place and time Skin- warm and dry, right knee surgical incision with aquacel dressing in place Psychiatry- normal mood and affect    Labs reviewed: Basic Metabolic Panel:  Recent Labs  04/04/15 0530 05/03/15 1402 10/28/15 1230 11/05/15 0414  NA 136 140 139 137  K 3.0* 4.0 3.7 3.1*  CL 104  --  104 106  CO2 24 25 27 23   GLUCOSE 120* 96 96 126*  BUN 21* 20.6 30* 22*  CREATININE 0.99 1.5* 1.28* 0.98  CALCIUM 8.1* 10.1 10.1 8.4*   Liver Function Tests:  Recent Labs  05/03/15 1402  AST 17  ALT <9  ALKPHOS 77  BILITOT 0.59  PROT 7.7  ALBUMIN 3.6   No results for input(s): LIPASE, AMYLASE in the last 8760 hours. No results  for input(s): AMMONIA in the last 8760  hours. CBC:  Recent Labs  05/03/15 1402  11/05/15 0414 11/06/15 0400 11/07/15 0413  WBC 7.6  < > 11.2* 8.6 8.8  NEUTROABS 5.9  --   --   --   --   HGB 12.2  < > 11.8* 11.4* 11.5*  HCT 37.6  < > 35.2* 34.7* 34.2*  MCV 89.7  < > 86.3 87.2 86.1  PLT 471*  < > 308 260 268  < > = values in this interval not displayed.   Radiological Exams: Dg Knee 1-2 Views Right  10/28/2015  CLINICAL DATA:  Preop for right knee surgery, diffuse right knee pain, no injury EXAM: RIGHT KNEE - 1-2 VIEW COMPARISON:  None. FINDINGS: There is tricompartmental degenerative joint disease the right knee. There is considerable loss of joint space particularly laterally with almost complete loss of joint space with sclerosis and spurring present. There does appear to be faint chondrocalcinosis, and CPPD arthropathy is a consideration. No joint effusion is seen. No fracture is noted. IMPRESSION: Significant tricompartmental degenerative joint disease primarily involving the lateral and patellofemoral compartments. No fracture or effusion. Faint chondrocalcinosis -question CPPD. Electronically Signed   By: Ivar Drape M.D.   On: 10/28/2015 13:05   Dg Knee Right Port  11/04/2015  CLINICAL DATA:  Status post right knee replacement EXAM: PORTABLE RIGHT KNEE - 1-2 VIEW COMPARISON:  10/28/2015 FINDINGS: Right knee prosthesis is now seen. Air is noted in the surgical bed. No acute bony abnormality is seen. IMPRESSION: Status post right knee replacement. Electronically Signed   By: Inez Catalina M.D.   On: 11/04/2015 09:52    Assessment/Plan   Unsteady gait Will have patient work with PT/OT as tolerated to regain strength and restore function.  Fall precautions are in place.  Right knee osteoarthritis  S/P right total knee arthroplasty. Has orthopedic follow up. WBAT to RLE. Continue tylenol 1000 mg q6h prn with percocet 5-325 mg 1-2 tab q4h prn pain. Continue robaxin 500 mg q6h prn muscle spasm. Will have her work with  physical therapy and occupational therapy team to help with gait training and muscle strengthening exercises.fall precautions. Skin care. Encourage to be out of bed. Continue xarelto 10 mg daily for DVT prophylaxis.  Blood loss anemia Post op, monitor cbc  UTI Has history of recurrent UTI. Continue ciprofloxacin 500 mg bid for now for this and hydration to be maintained  Constipation On colace and miralax as needed and stable at present, monitor  hypokalemia With her on maxzide. Continue kcl supplement, pending bmp check  S/p Ileostomy Has history of ulcerative colitis. Continue ileostomy site care  Bladder spasm continue oxybutynin 5 mg q8h prn   Hypertension Monitor bp. continue Maxzide 25 mg daily and check bmp    Goals of care: short term rehabilitation   Labs/tests ordered: cbc, cmp  Family/ staff Communication: reviewed care plan with patient and nursing supervisor    Blanchie Serve, MD Internal Medicine [address], [place] [address] Cell Phone (Monday-Friday 8 am - 5 pm): [phone] On Call: [phone] and follow prompts after 5 pm and on weekends Office Phone: [phone] Office Fax: [phone]

## 2015-11-12 LAB — BASIC METABOLIC PANEL
BUN: 25 mg/dL — AB (ref 4–21)
Creatinine: 1.1 mg/dL (ref 0.5–1.1)
Glucose: 90 mg/dL
POTASSIUM: 3.6 mmol/L (ref 3.4–5.3)
SODIUM: 137 mmol/L (ref 137–147)

## 2015-11-19 ENCOUNTER — Non-Acute Institutional Stay (SKILLED_NURSING_FACILITY): Payer: Medicare Other | Admitting: Internal Medicine

## 2015-11-19 ENCOUNTER — Encounter: Payer: Self-pay | Admitting: Internal Medicine

## 2015-11-19 DIAGNOSIS — I10 Essential (primary) hypertension: Secondary | ICD-10-CM | POA: Diagnosis not present

## 2015-11-19 DIAGNOSIS — D62 Acute posthemorrhagic anemia: Secondary | ICD-10-CM | POA: Diagnosis not present

## 2015-11-19 DIAGNOSIS — R2681 Unsteadiness on feet: Secondary | ICD-10-CM

## 2015-11-19 DIAGNOSIS — N3289 Other specified disorders of bladder: Secondary | ICD-10-CM

## 2015-11-19 DIAGNOSIS — Z96651 Presence of right artificial knee joint: Secondary | ICD-10-CM

## 2015-11-19 DIAGNOSIS — Z932 Ileostomy status: Secondary | ICD-10-CM

## 2015-11-19 NOTE — Progress Notes (Signed)
LOCATION: [facility]  PCP: [name], MD   Code Status: Full Code  Goals of care: Advanced Directive information Advanced Directives 11/04/2015  Does patient have an advance directive? Yes  Type of Advance Directive Living will  Does patient want to make changes to advanced directive? No - Patient declined  Copy of advanced directive(s) in chart? No - copy requested  Would patient like information on creating an advanced directive? -  Pre-existing out of facility DNR order (yellow form or pink MOST form) -       Extended Emergency Contact Information Primary Emergency Contact: Sensabaugh,Harold Address: [place], [place] [address] [place] of [place] Phone: [phone] Mobile Phone: [phone] Relation: Brother Secondary Emergency Contact: Penn,Doris Address: [id number] [place]          [place], [place] [address] [place] Phone: [phone] Relation: Mother   Allergies  Allergen Reactions  . Demerol Nausea And Vomiting  . Dilaudid [Hydromorphone Hcl] Nausea And Vomiting and Other (See Comments)    Muscle cramping  . Reglan [Metoclopramide] Other (See Comments)    Cramps in hands  . Stadol [Butorphanol Tartrate] Nausea And Vomiting    Injection  . Cephalexin Rash  . Penicillins Rash    Rash only; note also rash to Keflex Has patient had a PCN reaction causing immediate rash, facial/tongue/throat swelling, SOB or lightheadedness with hypotension: rash Has patient had a PCN reaction causing severe rash involving mucus membranes or skin necrosis: no Has patient had a PCN reaction that required hospitalization no Has patient had a PCN reaction occurring within the last 10 years: no If all of the above answers are "NO", then may proceed with Cephalosporin use.    Chief Complaint  Patient presents with  . Discharge Note     HPI:  Patient is a 65 y.o. female seen today for discharge visit. She has  been here for short term rehabilitation post hospital admission from 11/04/15-11/07/15 with right knee OA and is status post right total knee arthroplasty. She has worked with PT and OT services. Her pain is under control with current pain regimen. She was seen by orthopedic surgeon yesterday and is now off Lyman. She is to start aspirin from today. She is seen in her room today.    Review of Systems:  Constitutional: Negative for fever HENT: Negative for headache Eyes: Negative for blurred vision, double vision and discharge. Wears glasses. Respiratory: Negative for cough, shortness of breath and wheezing.   Cardiovascular: Negative for chest pain, palpitations, leg swelling.  Gastrointestinal: Negative for heartburn, nausea, vomiting, abdominal pain. She is s/p ileostomy.  Genitourinary: Negative for dysuria and flank pain.  Musculoskeletal: Negative for back pain, fall in the facility.  Skin: Negative for itching, rash.  Neurological: Negative for dizziness.    Past Medical History:  Diagnosis Date  . Abnormal finding on cardiovascular stress test, ischemia anterolateral 02/13/2012   follow heart cath - pt told one tiny blockage that didn't even matter  . Arthritis    hands, lumbar spine, hips, right ankle  . Breast cancer ([place])   . 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 Cataract    Left Eye  . Complication of anesthesia 1996   pt has ileostomy and had surgery in 1996 that paralyzed  . Fatigue   . H/O nephrostomy 08/13/12  . H/O ulcerative colitis   . History  of bone density study 2012  . History of recurrent UTIs 02/13/2012  . Hot flashes   . Hypertension   . Ileostomy in place, secondary to ulcerative colitis x 20 years 02/13/2012  . 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 Large bowel perforation ([facility]) 1971/1996  . Phlebitis 1971  . Pulmonary embolus ([facility]) 1971  . S/P ileostomy ([facility])   . S/P radiation therapy 07/24/11 - 09/06/11   Left Breast/  5000 cGy in 25 Fractions with Boost of 1000 cGy in 5 Fractions  . Scoliosis   . Sleep apnea 2009   uses cpap-setting is 1  . Small bowel obstruction ([facility]) [id number]  . Small bowel perforation ([place]) 1971/1996  . Syncope, ? anginal equivilant 02/13/2012  . Torn rotator cuff 2005  . Ulcerative colitis [phone]     Medications:   Medication List       Accurate as of 11/19/15  1:55 PM. Always use your most recent med list.          acetaminophen 500 MG tablet Commonly known as:  TYLENOL Take 1,000 mg by mouth every 6 (six) hours as needed for moderate pain.   aspirin 325 MG EC tablet Take 325 mg by mouth daily.   Bismuth Subgallate 200 MG Chew Chew 400 mg by mouth 4 (four) times daily -  with meals and at bedtime.   cholecalciferol 1000 units tablet Commonly known as:  VITAMIN D Take 1,000 Units by mouth daily.   docusate sodium 100 MG capsule Commonly known as:  COLACE Take 1 capsule (100 mg total) by mouth 2 (two) times daily as needed for mild constipation.   hydrOXYzine 25 MG tablet Commonly known as:  ATARAX/VISTARIL Take 25 mg by mouth at bedtime.   methocarbamol 500 MG tablet Commonly known as:  ROBAXIN Take 500 mg by mouth every 6 (six) hours as needed for muscle spasms.   multivitamin tablet Take 1 tablet by mouth daily.   oxybutynin 5 MG tablet Commonly known as:  DITROPAN Take 5 mg by mouth every 8 (eight) hours as needed for bladder spasms.   oxyCODONE-acetaminophen 5-325 MG tablet Commonly known as:  PERCOCET/ROXICET Take 1-2 tablets by mouth every 6 (six) hours as needed for severe pain.   polyethylene glycol packet Commonly known as:  MIRALAX / GLYCOLAX Take 17 g by mouth daily as needed.   potassium chloride SA 20 MEQ tablet Commonly known as:  K-DUR,KLOR-CON Take 1 tablet (20 mEq total) by mouth daily.   PROCEL Powd Take 2 scoop by mouth 2 (two) times daily.   saccharomyces boulardii 250 MG capsule Commonly known as:  FLORASTOR Take  250 mg by mouth daily.   tamoxifen 20 MG tablet Commonly known as:  NOLVADEX Take 1 tablet (20 mg total) by mouth daily.   triamterene-hydrochlorothiazide 37.5-25 MG tablet Commonly known as:  MAXZIDE-25 Take 1 tablet by mouth every morning.       Immunizations: Immunization History  Administered Date(s) Administered  . Influenza Split 01/23/2012     Physical Exam: Vitals:   11/19/15 1236  BP: 134/64  Pulse: 72  Resp: 16  Temp: 98.7 F (37.1 C)  TempSrc: Oral  SpO2: 100%  Weight: 172 lb 12.8 oz (78.4 kg)  Height: 5' 4"  (1.626 m)   Body mass index is 29.66 kg/m.  General- elderly female, overweight Head- normocephalic, atraumatic Throat- moist mucus membrane Eyes- PERRLA, EOMI, no pallor, no icterus Cardiovascular- normal s1,s2, + leg  edema Respiratory- bilateral clear to auscultation Abdomen- bowel sounds present, soft, non tender Musculoskeletal- able to move all 4 extremities, limited right knee range of motion, using a walker Neurological- alert and oriented to person, place and time Skin- warm and dry Psychiatry- normal mood and affect    Labs reviewed: Basic Metabolic Panel:  Recent Labs  04/04/15 0530 05/03/15 1402 10/28/15 1230 11/05/15 0414 11/09/15 11/12/15  NA 136 140 139 137 133* 137  K 3.0* 4.0 3.7 3.1* 3.3* 3.6  CL 104  --  104 106  --   --   CO2 24 25 27 23   --   --   GLUCOSE 120* 96 96 126*  --   --   BUN 21* 20.6 30* 22* 22* 25*  CREATININE 0.99 1.5* 1.28* 0.98 1.1 1.1  CALCIUM 8.1* 10.1 10.1 8.4*  --   --    Liver Function Tests:  Recent Labs  05/03/15 1402 11/09/15  AST 17 26  ALT <9 18  ALKPHOS 77 77  BILITOT 0.59  --   PROT 7.7  --   ALBUMIN 3.6  --    No results for input(s): LIPASE, AMYLASE in the last 8760 hours. No results for input(s): AMMONIA in the last 8760 hours. CBC:  Recent Labs  05/03/15 1402  11/05/15 0414 11/06/15 0400 11/07/15 0413 11/09/15  WBC 7.6  < > 11.2* 8.6 8.8 5.6  NEUTROABS 5.9  --    --   --   --   --   HGB 12.2  < > 11.8* 11.4* 11.5* 11.4*  HCT 37.6  < > 35.2* 34.7* 34.2* 34*  MCV 89.7  < > 86.3 87.2 86.1  --   PLT 471*  < > 308 260 268 340  < > = values in this interval not displayed.   Radiological Exams: Dg Knee 1-2 Views Right  Result Date: 10/28/2015 CLINICAL DATA:  Preop for right knee surgery, diffuse right knee pain, no injury EXAM: RIGHT KNEE - 1-2 VIEW COMPARISON:  None. FINDINGS: There is tricompartmental degenerative joint disease the right knee. There is considerable loss of joint space particularly laterally with almost complete loss of joint space with sclerosis and spurring present. There does appear to be faint chondrocalcinosis, and CPPD arthropathy is a consideration. No joint effusion is seen. No fracture is noted. IMPRESSION: Significant tricompartmental degenerative joint disease primarily involving the lateral and patellofemoral compartments. No fracture or effusion. Faint chondrocalcinosis -question CPPD. Electronically Signed   By: Ivar Drape M.D.   On: 10/28/2015 13:05   Dg Knee Right Port  Result Date: 11/04/2015 CLINICAL DATA:  Status post right knee replacement EXAM: PORTABLE RIGHT KNEE - 1-2 VIEW COMPARISON:  10/28/2015 FINDINGS: Right knee prosthesis is now seen. Air is noted in the surgical bed. No acute bony abnormality is seen. IMPRESSION: Status post right knee replacement. Electronically Signed   By: Inez Catalina M.D.   On: 11/04/2015 09:52    Assessment/Plan   Unsteady gait Will have patient continue to work with PT/OT as tolerated to regain strength and restore function.   Right knee osteoarthritis  S/P right total knee arthroplasty. WBAT to RLE. Continue robaxin 500 mg q6h prn muscle spasm. Continue aspirin EC 325 mg daily for dvt prophylaxis. Continue tylenol 1000 mg q6h prn pain and percocet 5-325 mg 1-2 tab q6h prn pain. To follow with orthopedics. To work with PT and OT services at home  Blood loss anemia Post op, stable  cbc  HTN continue  maxzide. Continue kcl supplement  S/p Ileostomy Has history of ulcerative colitis. Continue ileostomy site care  Bladder spasm continue oxybutynin 5 mg q8h prn    Patient is being discharged with the following home health services:  PT and OT for helping improve her endurance and for assistance with ADLs.   Patient is being discharged with the following durable medical equipment: none, she has a walker at home   Patient has been advised to f/u with their PCP in 1-2 weeks to bring them up to date on their rehab stay.  Social services at facility was responsible for arranging this appointment.  Pt was provided with a 30 day supply of prescriptions for medications and refills must be obtained from their PCP.  For controlled substances, a more limited supply may be provided adequate until PCP appointment only.   Family/ staff Communication: reviewed care plan with patient and nursing supervisor    Blanchie Serve, MD Internal Medicine [place], [place] [address] Cell Phone (Monday-Friday 8 am - 5 pm): [phone] On Call: [phone] and follow prompts after 5 pm and on weekends Office Phone: [phone] Office Fax: [phone]

## 2016-01-25 ENCOUNTER — Other Ambulatory Visit (HOSPITAL_COMMUNITY): Payer: Self-pay | Admitting: Interventional Radiology

## 2016-01-25 DIAGNOSIS — Z86718 Personal history of other venous thrombosis and embolism: Secondary | ICD-10-CM

## 2016-02-09 ENCOUNTER — Ambulatory Visit
Admission: RE | Admit: 2016-02-09 | Discharge: 2016-02-09 | Disposition: A | Discharge status: Home / Self Care | Payer: Medicare Other | Source: Ambulatory Visit | Attending: Interventional Radiology | Admitting: Interventional Radiology

## 2016-02-09 ENCOUNTER — Other Ambulatory Visit: Payer: Self-pay | Admitting: Interventional Radiology

## 2016-02-09 DIAGNOSIS — Z86718 Personal history of other venous thrombosis and embolism: Secondary | ICD-10-CM

## 2016-02-09 DIAGNOSIS — Z95828 Presence of other vascular implants and grafts: Secondary | ICD-10-CM

## 2016-02-09 NOTE — Progress Notes (Signed)
Patient ID: Deanna Burke, female   DOB: May 21, 1950, 65 y.o.   MRN: [id number]       Chief Complaint: Patient was seen in consultation today for IVC filter removal at the request of Watts,John  Referring Physician(s): Watts,John  History of Present Illness: Deanna Burke is a 65 y.o. female who presented to our service in December 2016 with request for caval filtration. She had a history of pulmonary embolus in 1970s associated with an ulcerative colitis flare. She was treated with standard anticoagulation  regimen at that time. She was not placed on lifelong anticoagulation. She had no subsequent DVT or PE. However, she was scheduled for bilateral knee replacement surgery last year  and caval filtration was requested. The IVC filter was placed in December 2016. She underwent left knee replacement surgery in December, and contralateral right knee replacement surgery in July 2017.  Both procedures well well without evident complication. She's currently not taking any anticoagulation except for low dose aspirin.  Past Medical History:  Diagnosis Date  . Abnormal finding on cardiovascular stress test, ischemia anterolateral 02/13/2012   follow heart cath - pt told one tiny blockage that didn't even matter  . Arthritis    hands, lumbar spine, hips, right ankle  . Breast cancer ([facility])   . 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 Cataract    Left Eye  . Complication of anesthesia 1996   pt has ileostomy and had surgery in 1996 that paralyzed  . Fatigue   . H/O nephrostomy 08/13/12  . H/O ulcerative colitis   . History of bone density study 2012  . History of recurrent UTIs 02/13/2012  . Hot flashes   . Hypertension   . Ileostomy in place, secondary to ulcerative colitis x 20 years 02/13/2012  . 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 Large bowel perforation ([facility]) 1971/1996  . Phlebitis 1971  . Pulmonary embolus ([facility]) 1971  . S/P ileostomy ([facility])   . S/P  radiation therapy 07/24/11 - 09/06/11   Left Breast/ 5000 cGy in 25 Fractions with Boost of 1000 cGy in 5 Fractions  . Scoliosis   . Sleep apnea 2009   uses cpap-setting is 1  . Small bowel obstruction [id number]  . Small bowel perforation ([facility]) 1971/1996  . Syncope, ? anginal equivilant 02/13/2012  . Torn rotator cuff 2005  . Ulcerative colitis [phone]    Past Surgical History:  Procedure Laterality Date  . ABDOMINAL HYSTERECTOMY  1996   partial  . APPENDECTOMY    . 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  ER:+, PR+,   . BREAST SURGERY  05/03/11   LEFT BREAST NEEDLE CORE BIOPSY- DCIS  . CARDIAC CATHETERIZATION    . COLECTOMY  1973  . EXPLORATORY LAPAROTOMY  1978/1990   with lysis of adhesions  . exploratory laps     several due to abd pain related to ulcerative colitis  . EYE SURGERY  05/08/2011   left cataract removal   . ILEOSTOMY  1973  . LAPAROSCOPIC NEPHRECTOMY Right 08/14/2012   Procedure: RIGHT LAPAROSCOPIC RETROPERITONEAL LAPAROSCOPIC NEPHRECTOMY ;  Surgeon: Alexis Frock, MD;  Location: [facility];  Service: Urology;  Laterality: Right;  RIGHT LAPAROSCOPIC RETROPERITONEAL LAPAROSCOPIC NEPHRECTOMY, POSSIBLE OPEN   . LEFT HEART CATHETERIZATION WITH CORONARY ANGIOGRAM N/A 02/13/2012   Procedure: LEFT HEART CATHETERIZATION WITH CORONARY ANGIOGRAM;  Surgeon: Lorretta Harp, MD;  Location: [facility] CATH LAB;  Service: Cardiovascular;  Laterality: N/A;  . ROTATOR CUFF REPAIR  2006   Right  . TOTAL KNEE ARTHROPLASTY Left 04/01/2015   Procedure: LEFT TOTAL KNEE ARTHROPLASTY;  Surgeon: Susa Day, MD;  Location: [facility];  Service: Orthopedics;  Laterality: Left;  . TOTAL KNEE ARTHROPLASTY Right 11/04/2015   Procedure: RIGHT TOTAL KNEE ARTHROPLASTY;  Surgeon: Susa Day, MD;  Location: [facility];  Service: Orthopedics;  Laterality: Right;  [name] VAGINOPLASTY  1975    Allergies: Demerol;  Dilaudid [hydromorphone hcl]; Reglan [metoclopramide]; Stadol [butorphanol tartrate]; Cephalexin; and Penicillins  Medications: Prior to Admission medications   Medication Sig Start Date End Date Taking? Authorizing Provider  acetaminophen (TYLENOL) 500 MG tablet Take 1,000 mg by mouth every 6 (six) hours as needed for moderate pain.    Historical Provider, MD  aspirin 325 MG EC tablet Take 325 mg by mouth daily.    Historical Provider, MD  Bismuth Subgallate 200 MG CHEW Chew 400 mg by mouth 4 (four) times daily -  with meals and at bedtime.    Historical Provider, MD  cholecalciferol (VITAMIN D) 1000 units tablet Take 1,000 Units by mouth daily.    Historical Provider, MD  docusate sodium (COLACE) 100 MG capsule Take 1 capsule (100 mg total) by mouth 2 (two) times daily as needed for mild constipation. 11/04/15   Susa Day, MD  hydrOXYzine (ATARAX/VISTARIL) 25 MG tablet Take 25 mg by mouth at bedtime.    Historical Provider, MD  methocarbamol (ROBAXIN) 500 MG tablet Take 500 mg by mouth every 6 (six) hours as needed for muscle spasms.    Historical Provider, MD  Multiple Vitamin (MULTIVITAMIN) tablet Take 1 tablet by mouth daily.    Historical Provider, MD  oxybutynin (DITROPAN) 5 MG tablet Take 5 mg by mouth every 8 (eight) hours as needed for bladder spasms.  09/01/13   Historical Provider, MD  oxyCODONE-acetaminophen (PERCOCET/ROXICET) 5-325 MG tablet Take 1-2 tablets by mouth every 6 (six) hours as needed for severe pain.    Historical Provider, MD  polyethylene glycol (MIRALAX / GLYCOLAX) packet Take 17 g by mouth daily as needed.    Historical Provider, MD  potassium chloride SA (K-DUR,KLOR-CON) 20 MEQ tablet Take 1 tablet (20 mEq total) by mouth daily. 06/23/14   Chauncey Cruel, MD  Protein (PROCEL) POWD Take 2 scoop by mouth 2 (two) times daily.    Historical Provider, MD  saccharomyces boulardii (FLORASTOR) 250 MG capsule Take 250 mg by mouth daily.    Historical Provider, MD    tamoxifen (NOLVADEX) 20 MG tablet Take 1 tablet (20 mg total) by mouth daily. 05/10/15   Chauncey Cruel, MD  triamterene-hydrochlorothiazide (MAXZIDE-25) 37.5-25 MG per tablet Take 1 tablet by mouth every morning.    Historical Provider, MD     Family History  Problem Relation Age of Onset  . Cancer Other     Breast  . Atrial fibrillation Mother   . Heart attack Father   . Heart attack Paternal Uncle   . Breast cancer Maternal Grandmother   . Stomach cancer Maternal Grandfather   . Cancer Paternal Grandmother     stomach  . Atrial fibrillation Brother     Social History   Social History  . Marital status: Divorced    Spouse name: N/A  . Number of children: N/A  . Years of education: N/A   Social History Main Topics  . Smoking status: Never Smoker  .  Smokeless tobacco: Never Used  . Alcohol use No  . Drug use: No  . Sexual activity: No     Comment: MENARCHE 11, Nulliparity, MENOPAUSE 1996, HRT X 16 YEARS- STOPPED 04/21/11, BC X 2 YEARS   Other Topics Concern  . Not on file   Social History Narrative   Lives alone.  Does not use any assist device.      ECOG Status: 0 - Asymptomatic  Review of Systems: A 12 point ROS discussed and pertinent positives are indicated in the HPI above.  All other systems are negative.  Review of Systems  Vital Signs: There were no vitals taken for this visit.  Physical Exam  Mallampati Score:     Imaging: No results found.  Labs:  CBC:  Recent Labs  11/05/15 0414 11/06/15 0400 11/07/15 0413 11/09/15  WBC 11.2* 8.6 8.8 5.6  HGB 11.8* 11.4* 11.5* 11.4*  HCT 35.2* 34.7* 34.2* 34*  PLT 308 260 268 340    COAGS:  Recent Labs  03/25/15 1145 10/28/15 1230  INR 1.04 0.96    BMP:  Recent Labs  04/03/15 0555 04/04/15 0530 05/03/15 1402 10/28/15 1230 11/05/15 0414 11/09/15 11/12/15  NA 136 136 140 139 137 133* 137  K 3.7 3.0* 4.0 3.7 3.1* 3.3* 3.6  CL 103 104  --  104 106  --   --   CO2 27 24 25 27 23    --   --   GLUCOSE 126* 120* 96 96 126*  --   --   BUN 18 21* 20.6 30* 22* 22* 25*  CALCIUM 8.4* 8.1* 10.1 10.1 8.4*  --   --   CREATININE 1.06* 0.99 1.5* 1.28* 0.98 1.1 1.1  GFRNONAA 53* 58*  --  43* 59*  --   --   GFRAA >60 >60  --  49* >60  --   --     LIVER FUNCTION TESTS:  Recent Labs  05/03/15 1402 11/09/15  BILITOT 0.59  --   AST 17 26  ALT <9 18  ALKPHOS 77 77  PROT 7.7  --   ALBUMIN 3.6  --     TUMOR MARKERS: No results for input(s): AFPTM, CEA, CA199, CHROMGRNA in the last 8760 hours.  Assessment and Plan:  My impression is that the patient did well with prophylactic caval filtration for bilateral knee replacement surgery. She has no clinical evidence of DVT or PE. Ultrasound today confirms no lower extremity acute or chronic DVT.  I reviewed with the patient pathophysiology of venous thromboembolism  and the concept of caval filtration. We reviewed the benefit of caval filtration when there is a  risk of DVT or PE, as well as the potential long-term side effects and complications of caval filtration. The patient is at low risk for DVT or PE currently and there is no indication for continued anticoagulation or caval filtration. We discussed the technique of IVC filter retrieval, possible risks and complications, and alternatives. The patient understood and was motivated to proceed. Accordingly, we can set her up for outpatient IVC filter retrieval with moderate sedation at her convenience.  Thank you for this interesting consult.  I greatly enjoyed meeting ZALI KAMAKA and look forward to participating in their care.  A copy of this report was sent to the requesting provider on this date.  Electronically Signed: Deland Slocumb III, DAYNE Martise Waddell 02/09/2016, 10:59 AM   I spent a total of    40 Minutes in face to face in clinical  consultation, greater than 50% of which was counseling/coordinating care for removal of IVC filter.

## 2016-03-07 ENCOUNTER — Other Ambulatory Visit: Payer: Self-pay | Admitting: Radiology

## 2016-03-08 ENCOUNTER — Ambulatory Visit (HOSPITAL_COMMUNITY)
Admission: RE | Admit: 2016-03-08 | Discharge: 2016-03-08 | Disposition: A | Discharge status: Home / Self Care | Payer: Medicare Other | Source: Ambulatory Visit | Attending: Interventional Radiology | Admitting: Interventional Radiology

## 2016-03-08 ENCOUNTER — Encounter (HOSPITAL_COMMUNITY): Payer: Self-pay

## 2016-03-08 ENCOUNTER — Other Ambulatory Visit: Payer: Self-pay | Admitting: Interventional Radiology

## 2016-03-08 DIAGNOSIS — Z96653 Presence of artificial knee joint, bilateral: Secondary | ICD-10-CM | POA: Diagnosis not present

## 2016-03-08 DIAGNOSIS — K5190 Ulcerative colitis, unspecified, without complications: Secondary | ICD-10-CM | POA: Diagnosis not present

## 2016-03-08 DIAGNOSIS — Z4589 Encounter for adjustment and management of other implanted devices: Secondary | ICD-10-CM | POA: Diagnosis not present

## 2016-03-08 DIAGNOSIS — I10 Essential (primary) hypertension: Secondary | ICD-10-CM | POA: Diagnosis not present

## 2016-03-08 DIAGNOSIS — Z7982 Long term (current) use of aspirin: Secondary | ICD-10-CM | POA: Diagnosis not present

## 2016-03-08 DIAGNOSIS — Z79810 Long term (current) use of selective estrogen receptor modulators (SERMs): Secondary | ICD-10-CM | POA: Diagnosis not present

## 2016-03-08 DIAGNOSIS — Z905 Acquired absence of kidney: Secondary | ICD-10-CM | POA: Diagnosis not present

## 2016-03-08 DIAGNOSIS — Z79899 Other long term (current) drug therapy: Secondary | ICD-10-CM | POA: Insufficient documentation

## 2016-03-08 DIAGNOSIS — Z5309 Procedure and treatment not carried out because of other contraindication: Secondary | ICD-10-CM | POA: Insufficient documentation

## 2016-03-08 DIAGNOSIS — G4730 Sleep apnea, unspecified: Secondary | ICD-10-CM | POA: Diagnosis not present

## 2016-03-08 DIAGNOSIS — Z932 Ileostomy status: Secondary | ICD-10-CM | POA: Diagnosis not present

## 2016-03-08 DIAGNOSIS — Q639 Congenital malformation of kidney, unspecified: Secondary | ICD-10-CM | POA: Diagnosis not present

## 2016-03-08 DIAGNOSIS — Z86711 Personal history of pulmonary embolism: Secondary | ICD-10-CM | POA: Diagnosis not present

## 2016-03-08 DIAGNOSIS — Z853 Personal history of malignant neoplasm of breast: Secondary | ICD-10-CM | POA: Insufficient documentation

## 2016-03-08 DIAGNOSIS — M4186 Other forms of scoliosis, lumbar region: Secondary | ICD-10-CM | POA: Insufficient documentation

## 2016-03-08 DIAGNOSIS — Z923 Personal history of irradiation: Secondary | ICD-10-CM | POA: Diagnosis not present

## 2016-03-08 DIAGNOSIS — Z95828 Presence of other vascular implants and grafts: Secondary | ICD-10-CM

## 2016-03-08 HISTORY — PX: IR GENERIC HISTORICAL: IMG1180011

## 2016-03-08 LAB — CBC
HCT: 38.7 % (ref 36.0–46.0)
HEMOGLOBIN: 12.8 g/dL (ref 12.0–15.0)
MCH: 28.7 pg (ref 26.0–34.0)
MCHC: 33.1 g/dL (ref 30.0–36.0)
MCV: 86.8 fL (ref 78.0–100.0)
Platelets: 336 10*3/uL (ref 150–400)
RBC: 4.46 MIL/uL (ref 3.87–5.11)
RDW: 14.3 % (ref 11.5–15.5)
WBC: 5.7 10*3/uL (ref 4.0–10.5)

## 2016-03-08 LAB — BASIC METABOLIC PANEL
ANION GAP: 9 (ref 5–15)
BUN: 34 mg/dL — ABNORMAL HIGH (ref 6–20)
CHLORIDE: 105 mmol/L (ref 101–111)
CO2: 23 mmol/L (ref 22–32)
Calcium: 9.9 mg/dL (ref 8.9–10.3)
Creatinine, Ser: 1.29 mg/dL — ABNORMAL HIGH (ref 0.44–1.00)
GFR calc non Af Amer: 42 mL/min — ABNORMAL LOW (ref >60)
GFR, EST AFRICAN AMERICAN: 49 mL/min — AB (ref >60)
Glucose, Bld: 96 mg/dL (ref 65–99)
Potassium: 3.6 mmol/L (ref 3.5–5.1)
Sodium: 137 mmol/L (ref 135–145)

## 2016-03-08 LAB — PROTIME-INR
INR: 0.99
PROTHROMBIN TIME: 13.1 s (ref 11.4–15.2)

## 2016-03-08 LAB — APTT: aPTT: 26 seconds (ref 24–36)

## 2016-03-08 MED ORDER — MIDAZOLAM HCL 2 MG/2ML IJ SOLN
INTRAMUSCULAR | Status: AC
  Filled 2016-03-08: qty 6

## 2016-03-08 MED ORDER — FENTANYL CITRATE (PF) 100 MCG/2ML IJ SOLN
INTRAMUSCULAR | Status: AC
  Filled 2016-03-08: qty 4

## 2016-03-08 MED ORDER — IOPAMIDOL (ISOVUE-300) INJECTION 61%
INTRAVENOUS | Status: AC | PRN
  Administered 2016-03-08: 55 mL via INTRAVENOUS

## 2016-03-08 MED ORDER — LIDOCAINE HCL 1 % IJ SOLN
INTRAMUSCULAR | Status: DC
Start: 2016-03-08 — End: 2016-03-09
  Filled 2016-03-08: qty 20

## 2016-03-08 MED ORDER — FENTANYL CITRATE (PF) 100 MCG/2ML IJ SOLN
INTRAMUSCULAR | Status: AC | PRN
  Administered 2016-03-08: 50 ug via INTRAVENOUS
  Administered 2016-03-08 (×2): 25 ug via INTRAVENOUS

## 2016-03-08 MED ORDER — IOPAMIDOL (ISOVUE-300) INJECTION 61%
INTRAVENOUS | Status: AC
  Filled 2016-03-08: qty 100

## 2016-03-08 MED ORDER — SODIUM CHLORIDE 0.9 % IV SOLN
INTRAVENOUS | Status: DC
  Administered 2016-03-08: 12:00:00 via INTRAVENOUS

## 2016-03-08 MED ORDER — IOPAMIDOL (ISOVUE-300) INJECTION 61%
INTRAVENOUS | Status: AC
  Filled 2016-03-08: qty 50

## 2016-03-08 MED ORDER — MIDAZOLAM HCL 2 MG/2ML IJ SOLN
INTRAMUSCULAR | Status: AC | PRN
  Administered 2016-03-08 (×4): 1 mg via INTRAVENOUS

## 2016-03-08 NOTE — Discharge Instructions (Signed)
Moderate Conscious Sedation, Adult, Care After These instructions provide you with information about caring for yourself after your procedure. Your health care provider may also give you more specific instructions. Your treatment has been planned according to current medical practices, but problems sometimes occur. Call your health care provider if you have any problems or questions after your procedure. What can I expect after the procedure? After your procedure, it is common:  To feel sleepy for several hours.  To feel clumsy and have poor balance for several hours.  To have poor judgment for several hours.  To vomit if you eat too soon. Follow these instructions at home: For at least 24 hours after the procedure:   Do not:  Participate in activities where you could fall or become injured.  Drive.  Use heavy machinery.  Drink alcohol.  Take sleeping pills or medicines that cause drowsiness.  Make important decisions or sign legal documents.  Take care of children on your own.  Rest. Eating and drinking  Follow the diet recommended by your health care provider.  If you vomit:  Drink water, juice, or soup when you can drink without vomiting.  Make sure you have little or no nausea before eating solid foods. General instructions  Have a responsible adult stay with you until you are awake and alert.  Take over-the-counter and prescription medicines only as told by your health care provider.  If you smoke, do not smoke without supervision.  Keep all follow-up visits as told by your health care provider. This is important. Contact a health care provider if:  You keep feeling nauseous or you keep vomiting.  You feel light-headed.  You develop a rash.  You have a fever. Get help right away if:  You have trouble breathing. This information is not intended to replace advice given to you by your health care provider. Make sure you discuss any questions you have  with your health care provider. Document Released: 01/29/2013 Document Revised: 09/13/2015 Document Reviewed: 07/31/2015 Elsevier Interactive Patient Education  2017 [place].  May remove dressing 24 hours post procedure and bathe or shower.  Do no submerge in water for several days Replace dressing with bandaid if necessary If you experience swelling, bleeding, signs of infection or any other problems consult your physician

## 2016-03-08 NOTE — H&P (Signed)
Referring Physician(s): Bean,J  Supervising Physician: Arne Cleveland  Patient Status:  Deanna Burke OP  Chief Complaint: IVC Filter retrieval  Subjective: Deanna Burke is a 65 year old female with a history of pulmonary embolus in the 70s that was associated with ulcerative colitis, kidney disease, breast cancer, ileostomy, and sleep apnea. At the time of pulmonary embolus she was not placed on lifelong anticoagulation and without subsequent DVT or PE. She underwent bilateral knee replacement surgery (right Dec 2016, left July 2017) and caval filtration was requested and placed in Dec 2016. Bilateral lower extremity venous Doppler ultrasound completed on 02/09/2016 revealed no evidence of lower extremity deep vein thrombosis bilaterally. She was seen in the IR clinic by Dr. Vernard Gambles and deemed an appropriate candidate for IVC filter removal. She presents today for removal of IVC filter. She currently has no complaints and denies fever, headache, shortness of breath, chest pain, abdominal pain, back pain, nausea, or vomiting Past Medical History:  Diagnosis Date  . Abnormal finding on cardiovascular stress test, ischemia anterolateral 02/13/2012   follow heart cath - pt told one tiny blockage that didn't even matter  . Arthritis    hands, lumbar spine, hips, right ankle  . Breast cancer ([place])   . 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 Cataract    Left Eye  . Complication of anesthesia 1996   pt has ileostomy and had surgery in 1996 that paralyzed  . Fatigue   . H/O nephrostomy 08/13/12  . H/O ulcerative colitis   . History of bone density study 2012  . History of recurrent UTIs 02/13/2012  . Hot flashes   . Hypertension   . Ileostomy in place, secondary to ulcerative colitis x 20 years 02/13/2012  . 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 Large bowel perforation ([place]) 1971/1996  . Phlebitis 1971  . Pulmonary embolus ([place]) 1971  . S/P ileostomy ([place])     . S/P radiation therapy 07/24/11 - 09/06/11   Left Breast/ 5000 cGy in 25 Fractions with Boost of 1000 cGy in 5 Fractions  . Scoliosis   . Sleep apnea 2009   uses cpap-setting is 1  . Small bowel obstruction [id number]  . Small bowel perforation ([place]) 1971/1996  . Syncope, ? anginal equivilant 02/13/2012  . Torn rotator cuff 2005  . Ulcerative colitis [phone]   Past Surgical History:  Procedure Laterality Date  . ABDOMINAL HYSTERECTOMY  1996   partial  . APPENDECTOMY    . 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  ER:+, PR+,   . BREAST SURGERY  05/03/11   LEFT BREAST NEEDLE CORE BIOPSY- DCIS  . CARDIAC CATHETERIZATION    . COLECTOMY  1973  . EXPLORATORY LAPAROTOMY  1978/1990   with lysis of adhesions  . exploratory laps     several due to abd pain related to ulcerative colitis  . EYE SURGERY  05/08/2011   left cataract removal   . ILEOSTOMY  1973  . LAPAROSCOPIC NEPHRECTOMY Right 08/14/2012   Procedure: RIGHT LAPAROSCOPIC RETROPERITONEAL LAPAROSCOPIC NEPHRECTOMY ;  Surgeon: Alexis Frock, MD;  Location: [facility];  Service: Urology;  Laterality: Right;  RIGHT LAPAROSCOPIC RETROPERITONEAL LAPAROSCOPIC NEPHRECTOMY, POSSIBLE OPEN   . LEFT HEART CATHETERIZATION WITH CORONARY ANGIOGRAM N/A 02/13/2012   Procedure: LEFT HEART CATHETERIZATION WITH CORONARY ANGIOGRAM;  Surgeon: Lorretta Harp, MD;  Location: [facility] CATH  LAB;  Service: Cardiovascular;  Laterality: N/A;  . ROTATOR CUFF REPAIR  2006   Right  . TOTAL KNEE ARTHROPLASTY Left 04/01/2015   Procedure: LEFT TOTAL KNEE ARTHROPLASTY;  Surgeon: Susa Day, MD;  Location: [facility];  Service: Orthopedics;  Laterality: Left;  . TOTAL KNEE ARTHROPLASTY Right 11/04/2015   Procedure: RIGHT TOTAL KNEE ARTHROPLASTY;  Surgeon: Susa Day, MD;  Location: [facility];  Service: Orthopedics;  Laterality: Right;  [name] VAGINOPLASTY  1975      Allergies: Demerol; Dilaudid [hydromorphone hcl]; Reglan [metoclopramide]; Stadol [butorphanol tartrate]; Cephalexin; and Penicillins  Medications: Prior to Admission medications   Medication Sig Start Date End Date Taking? Authorizing Provider  acetaminophen (TYLENOL) 500 MG tablet Take 1,000 mg by mouth every 6 (six) hours as needed for moderate pain.    Historical Provider, MD  aspirin 325 MG EC tablet Take 325 mg by mouth daily.    Historical Provider, MD  Bismuth Subgallate 200 MG CHEW Chew 400 mg by mouth 4 (four) times daily -  with meals and at bedtime.    Historical Provider, MD  cholecalciferol (VITAMIN D) 1000 units tablet Take 1,000 Units by mouth daily.    Historical Provider, MD  docusate sodium (COLACE) 100 MG capsule Take 1 capsule (100 mg total) by mouth 2 (two) times daily as needed for mild constipation. 11/04/15   Susa Day, MD  hydrOXYzine (ATARAX/VISTARIL) 25 MG tablet Take 25 mg by mouth at bedtime.    Historical Provider, MD  methocarbamol (ROBAXIN) 500 MG tablet Take 500 mg by mouth every 6 (six) hours as needed for muscle spasms.    Historical Provider, MD  Multiple Vitamin (MULTIVITAMIN) tablet Take 1 tablet by mouth daily.    Historical Provider, MD  oxybutynin (DITROPAN) 5 MG tablet Take 5 mg by mouth every 8 (eight) hours as needed for bladder spasms.  09/01/13   Historical Provider, MD  oxyCODONE-acetaminophen (PERCOCET/ROXICET) 5-325 MG tablet Take 1-2 tablets by mouth every 6 (six) hours as needed for severe pain.    Historical Provider, MD  polyethylene glycol (MIRALAX / GLYCOLAX) packet Take 17 g by mouth daily as needed.    Historical Provider, MD  potassium chloride SA (K-DUR,KLOR-CON) 20 MEQ tablet Take 1 tablet (20 mEq total) by mouth daily. 06/23/14   Chauncey Cruel, MD  Protein (PROCEL) POWD Take 2 scoop by mouth 2 (two) times daily.    Historical Provider, MD  saccharomyces boulardii (FLORASTOR) 250 MG capsule Take 250 mg by mouth daily.     Historical Provider, MD  tamoxifen (NOLVADEX) 20 MG tablet Take 1 tablet (20 mg total) by mouth daily. 05/10/15   Chauncey Cruel, MD  triamterene-hydrochlorothiazide (MAXZIDE-25) 37.5-25 MG per tablet Take 1 tablet by mouth every morning.    Historical Provider, MD     Vital Signs: BP 129/72 (BP Location: Right Arm)   Pulse 91   Temp 97.7 F (36.5 C) (Oral)   Resp 18   SpO2 99%   Physical Exam  Alert and oriented Pulm: CTAB CV: RRR ABD: soft, nontender, nondistended. Ileostomy located at the right side.  EXT: no edema  Imaging: No results found.  Labs:  CBC:  Recent Labs  11/05/15 0414 11/06/15 0400 11/07/15 0413 11/09/15  WBC 11.2* 8.6 8.8 5.6  HGB 11.8* 11.4* 11.5* 11.4*  HCT 35.2* 34.7* 34.2* 34*  PLT 308 260 268 340    COAGS:  Recent Labs  03/25/15 1145 10/28/15 1230  INR 1.04 0.96    BMP:  Recent Labs  04/03/15 0555 04/04/15 0530 05/03/15 1402 10/28/15 1230 11/05/15 0414 11/09/15 11/12/15  NA 136 136 140 139 137 133* 137  K 3.7 3.0* 4.0 3.7 3.1* 3.3* 3.6  CL 103 104  --  104 106  --   --   CO2 27 24 25 27 23   --   --   GLUCOSE 126* 120* 96 96 126*  --   --   BUN 18 21* 20.6 30* 22* 22* 25*  CALCIUM 8.4* 8.1* 10.1 10.1 8.4*  --   --   CREATININE 1.06* 0.99 1.5* 1.28* 0.98 1.1 1.1  GFRNONAA 53* 58*  --  43* 59*  --   --   GFRAA >60 >60  --  49* >60  --   --     LIVER FUNCTION TESTS:  Recent Labs  05/03/15 1402 11/09/15  BILITOT 0.59  --   AST 17 26  ALT <9 18  ALKPHOS 77 77  PROT 7.7  --   ALBUMIN 3.6  --     Assessment and Plan:  Deanna Burke is a 65 year old female with a history of pulmonary embolus in the 70s that was associated with ulcerative colitis, kidney disease, breast cancer, ileostomy, and sleep apnea. At the time of pulmonary embolus she was not placed on lifelong anticoagulation and without subsequent DVT or PE. She underwent bilateral knee replacement surgery (right Dec 2016, left July 2017) and caval  filtration was requested and placed in Dec 2016. Bilateral lower extremity venous Doppler ultrasound completed on 02/09/2016 revealed no evidence of lower extremity deep vein thrombosis bilaterally. She was seen in consultation by Dr. Vernard Gambles recently and deemed an appropriate candidate for IVC filter removal. She presents today for removal of IVC filter. Risks and benefits discussed with the patient including, but not limited to bleeding, infection, contrast induced renal failure, inability to remove filter, filter fracture or migration which can lead to emergency surgery or even death, strut penetration with damage or irritation to adjacent structures and caval thrombosis. All of the patient's questions were answered, patient is agreeable to proceed. Consent signed and in chart.  Electronically Signed: D. Rowe Robert 03/08/2016, 11:40 AM   I spent a total of 20 minutes at the the patient's bedside AND on the patient's hospital floor or unit, greater than 50% of which was counseling/coordinating care for IVC filter retrieval.

## 2016-03-08 NOTE — Procedures (Signed)
IVCgram Attempted IVC filter removal No complication No blood loss. See complete dictation in [facility].

## 2016-04-28 ENCOUNTER — Other Ambulatory Visit: Payer: Self-pay | Admitting: Oncology

## 2016-04-28 DIAGNOSIS — Z853 Personal history of malignant neoplasm of breast: Secondary | ICD-10-CM

## 2016-05-15 ENCOUNTER — Ambulatory Visit
Admission: RE | Admit: 2016-05-15 | Discharge: 2016-05-15 | Disposition: A | Discharge status: Home / Self Care | Payer: Medicare Other | Source: Ambulatory Visit | Attending: Oncology | Admitting: Oncology

## 2016-05-15 DIAGNOSIS — Z853 Personal history of malignant neoplasm of breast: Secondary | ICD-10-CM

## 2016-05-23 ENCOUNTER — Ambulatory Visit: Payer: Medicare Other | Admitting: Oncology

## 2016-07-25 ENCOUNTER — Telehealth: Payer: Self-pay | Admitting: *Deleted

## 2016-07-25 DIAGNOSIS — Z170 Estrogen receptor positive status [ER+]: Principal | ICD-10-CM

## 2016-07-25 DIAGNOSIS — C50412 Malignant neoplasm of upper-outer quadrant of left female breast: Secondary | ICD-10-CM

## 2016-07-25 MED ORDER — TAMOXIFEN CITRATE 20 MG PO TABS: 20.0000 mg | ORAL_TABLET | Freq: Every day | ORAL | 3 refills | Status: DC

## 2016-07-25 NOTE — Telephone Encounter (Signed)
This RN spoke with pt per her VM wanting to " know why I need to continue the tamoxifen "  Per chart review - noted pt was to be seen in January 2018 - marked as a no show with no further appointments.  This RN contacted pt and discussed her concerns regarding " since being on the tamoxifen I have had repeated UTI's and lost a kidney and is it because I am on this medication?  This RN informed pt she was on the medication relating to her history of breast cancer- she has completed 5 years but noted in dictation she will continue tamoxifen so she can use vaginal estrogens for benefit postmenopausal symptoms related to atrophy.  Per phone discussion pt verified she is currently using premarin vaginal cream. She is on tamoxifen as well but will need a refill.  Pt verbalized understanding of above but still questions " should I continue taking the tamoxifen "  Plan per above is refill for tamoxifen will be given to continue current therapy as well as request for MD appointment so her concerns can be discussed.  Deanna Burke verbalized understanding and appreciation of discussion. No other needs at this time.

## 2016-08-02 ENCOUNTER — Telehealth: Payer: Self-pay | Admitting: Oncology

## 2016-08-02 NOTE — Telephone Encounter (Signed)
sw pt to confirm 4/17 appt at 230 pm per LOS

## 2016-08-08 ENCOUNTER — Ambulatory Visit (HOSPITAL_BASED_OUTPATIENT_CLINIC_OR_DEPARTMENT_OTHER): Payer: Medicare Other | Admitting: Adult Health

## 2016-08-08 VITALS — BP 120/49 | HR 95 | Temp 97.9°F | Resp 18 | Ht 64.0 in | Wt 179.5 lb

## 2016-08-08 DIAGNOSIS — D0512 Intraductal carcinoma in situ of left breast: Secondary | ICD-10-CM | POA: Diagnosis not present

## 2016-08-08 DIAGNOSIS — I10 Essential (primary) hypertension: Secondary | ICD-10-CM

## 2016-08-08 DIAGNOSIS — Z170 Estrogen receptor positive status [ER+]: Principal | ICD-10-CM

## 2016-08-08 DIAGNOSIS — R350 Frequency of micturition: Secondary | ICD-10-CM

## 2016-08-08 DIAGNOSIS — C50412 Malignant neoplasm of upper-outer quadrant of left female breast: Secondary | ICD-10-CM

## 2016-08-08 NOTE — Progress Notes (Signed)
ID: Sharyne Richters OB: Feb 19, 1951  MR#: [id number]  AJO#:[id number]  PCP: [name], MD GYN:  Omelia Blackwater SU: Coralie Keens, MD OTHER MD: Jamal Maes, MD;  Alexis Frock, MD, Eppie Gibson, MD, Susa Day MD  CHIEF COMPLAINT: Estrogen receptor positive Left Breast Cancer  CURRENT TREATMENT: Tamoxifen   HISTORY OF PRESENT ILLNESS: From the original intake note:  Deanna Burke had routine screening mammography 03/22/2011 at [facility]. This showed new microcalcifications in the left breast. She was recalled for additional views April 07, 2011, and these showed the calcifications to be pleomorphic and linear. 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   With this information the patient was referred for bilateral breast MRI, performed 04/27/2011. This showed no suspicious findings on the right. On the left there was 11 mm lobulated mass 5 cm from the prior biopsy clip. 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   Patient underwent left lumpectomy in February 2013 with negative margins.   Additional treatment history is detailed below.  INTERVAL HISTORY: Deanna Burke is here today for evaluation of her h/o breast cancer.  She is wanting to stop Tamoxifen.  She is having issues with frequent urinary tract infections and feels the Tamoxifen is contributing.  She is aware that she cannot take tamoxifen along with the vaginal estrogens that she is taking.    She exercises by walking three times weekly.     REVIEW OF SYSTEMS: Deanna Burke is doing well otherwise and is without any questions or concerns.  She denies fevers, chills, new pain, cough, swelling, breast changes or any further concerns.  A detailed ROS was non contributory.    PAST MEDICAL HISTORY: Past Medical History:  Diagnosis Date  .  Abnormal finding on cardiovascular stress test, ischemia anterolateral 02/13/2012   follow heart cath - pt told one tiny blockage that didn't even matter  . Arthritis    hands, lumbar spine, hips, right ankle  . Breast cancer ([place])   . 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 Cataract    Left Eye  . Complication of anesthesia 1996   pt has ileostomy and had surgery in 1996 that paralyzed  . Fatigue   . H/O nephrostomy 08/13/12  . H/O ulcerative colitis   . History of bone density study 2012  . History of recurrent UTIs 02/13/2012  . Hot flashes   . Hypertension   . Ileostomy in place, secondary to ulcerative colitis x 20 years 02/13/2012  . 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 Large bowel perforation ([place]) 1971/1996  . Phlebitis 1971  . Pulmonary embolus ([place]) 1971  . S/P ileostomy ([place])   . S/P radiation therapy 07/24/11 - 09/06/11   Left Breast/ 5000 cGy in 25 Fractions with Boost of 1000 cGy in 5 Fractions  . Scoliosis   . Sleep apnea 2009   uses cpap-setting is 1  . Small bowel obstruction [id number]  . Small bowel perforation ([place]) 1971/1996  . Syncope, ? anginal equivilant 02/13/2012  . Torn rotator cuff 2005  . Ulcerative colitis [phone]    PAST SURGICAL HISTORY: Past Surgical History:  Procedure Laterality Date  . ABDOMINAL HYSTERECTOMY  1996   partial  . APPENDECTOMY    . 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  ER:+, PR+,   . BREAST SURGERY  05/03/11   LEFT BREAST NEEDLE CORE BIOPSY- DCIS  . CARDIAC CATHETERIZATION    . COLECTOMY  1973  . EXPLORATORY LAPAROTOMY  1978/1990   with lysis of adhesions  . exploratory laps     several due to abd pain related to ulcerative colitis  . EYE SURGERY  05/08/2011   left cataract removal   . ILEOSTOMY  1973  . IR GENERIC HISTORICAL  03/08/2016   IR VENOCAVAGRAM IVC 03/08/2016 Arne Cleveland,  MD [facility]  . IR GENERIC HISTORICAL  03/08/2016   IR US GUIDE VASC ACCESS RIGHT 03/08/2016 Arne Cleveland, MD [facility]  . LAPAROSCOPIC NEPHRECTOMY Right 08/14/2012   Procedure: RIGHT LAPAROSCOPIC RETROPERITONEAL LAPAROSCOPIC NEPHRECTOMY ;  Surgeon: Alexis Frock, MD;  Location: [facility];  Service: Urology;  Laterality: Right;  RIGHT LAPAROSCOPIC RETROPERITONEAL LAPAROSCOPIC NEPHRECTOMY, POSSIBLE OPEN   . LEFT HEART CATHETERIZATION WITH CORONARY ANGIOGRAM N/A 02/13/2012   Procedure: LEFT HEART CATHETERIZATION WITH CORONARY ANGIOGRAM;  Surgeon: Lorretta Harp, MD;  Location: [facility] CATH LAB;  Service: Cardiovascular;  Laterality: N/A;  . ROTATOR CUFF REPAIR  2006   Right  . TOTAL KNEE ARTHROPLASTY Left 04/01/2015   Procedure: LEFT TOTAL KNEE ARTHROPLASTY;  Surgeon: Susa Day, MD;  Location: [facility];  Service: Orthopedics;  Laterality: Left;  . TOTAL KNEE ARTHROPLASTY Right 11/04/2015   Procedure: RIGHT TOTAL KNEE ARTHROPLASTY;  Surgeon: Susa Day, MD;  Location: [facility];  Service: Orthopedics;  Laterality: Right;  [name] VAGINOPLASTY  1975    FAMILY HISTORY Family History  Problem Relation Age of Onset  . Cancer Other     Breast  . Atrial fibrillation Mother   . Heart attack Father   . Heart attack Paternal Uncle   . Breast cancer Maternal Grandmother   . Stomach cancer Maternal Grandfather   . Cancer Paternal Grandmother     stomach  . Atrial fibrillation Brother   She had one brothers no sisters. There is no history of breast or ovarian cancer in the immediate family.   GYNECOLOGIC HISTORY:  (Reviewed 10/06/2013) menarche age 77, underwent hysterectomy and bilateral salpingo-oophorectomy in 1996. She took hormone replacement for 16 years, stopping only at the time of this diagnosis. She is GX P0.  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 She is divorced. She lives by herself, with no pets.     ADVANCED DIRECTIVES: Not in  place   HEALTH MAINTENANCE:  (Updated 10/06/2013) Social History  Substance Use Topics  . Smoking status: Never Smoker  . Smokeless tobacco: Never Used  . Alcohol use No     Colonoscopy: s/p ileostomy for ulcerative colitis  PAP: 04/23/2014  Bone density: 2007 - Normal  Lipid panel: Not on file/Dr. Arvind  Allergies  Allergen Reactions  . Demerol Nausea And Vomiting  . Dilaudid [Hydromorphone Hcl] Nausea And Vomiting and Other (See Comments)    Muscle cramping  . Reglan [Metoclopramide] Other (See Comments)    Cramps in hands  . Stadol [Butorphanol Tartrate] Nausea And Vomiting    Injection  . Cephalexin Rash  . Penicillins Rash    Rash only; note also rash to Keflex Has patient had a PCN reaction causing immediate rash, facial/tongue/throat swelling, SOB or lightheadedness with hypotension: rash Has patient had a PCN reaction causing severe rash involving mucus membranes or skin necrosis: no Has patient had a PCN reaction that required hospitalization no Has  patient had a PCN reaction occurring within the last 10 years: no If all of the above answers are "NO", then may proceed with Cephalosporin use.    Current Outpatient Prescriptions  Medication Sig Dispense Refill  . acetaminophen (TYLENOL) 500 MG tablet Take 1,000 mg by mouth every 6 (six) hours as needed for moderate pain.    [name] aspirin 325 MG EC tablet Take 325 mg by mouth daily.    . Bismuth Subgallate 200 MG CHEW Chew 400 mg by mouth 4 (four) times daily -  with meals and at bedtime.    . diphenhydrAMINE (BENADRYL) 25 MG tablet Take 50 mg by mouth at bedtime.    . ferrous sulfate 325 (65 FE) MG tablet Take 325 mg by mouth daily with breakfast.    . hydrOXYzine (ATARAX/VISTARIL) 25 MG tablet Take 25 mg by mouth at bedtime.    . Multiple Vitamin (MULTIVITAMIN) tablet Take 1 tablet by mouth daily.    . potassium chloride SA (K-DUR,KLOR-CON) 20 MEQ tablet Take 1 tablet (20 mEq total) by mouth daily. 30 tablet 0  .  PREMARIN vaginal cream Place 1 applicator vaginally 2 (two) times a week.  10  . tamoxifen (NOLVADEX) 20 MG tablet Take 1 tablet (20 mg total) by mouth daily. 90 tablet 3  . triamterene-hydrochlorothiazide (MAXZIDE-25) 37.5-25 MG per tablet Take 1 tablet by mouth every morning.    . TURMERIC PO Take 1 tablet by mouth daily.    . nitrofurantoin (MACRODANTIN) 100 MG capsule Take 100 mg by mouth 2 (two) times daily. For 3 days prn UTI    . sulfamethoxazole-trimethoprim (BACTRIM DS,SEPTRA DS) 800-160 MG tablet Take 1 tablet by mouth 2 (two) times daily. For 3 days prn UTI     No current facility-administered medications for this visit.     OBJECTIVE:  Vitals:   08/08/16 1431  BP: (!) 120/49  Pulse: 95  Resp: 18  Temp: 97.9 F (36.6 C)     Body mass index is 30.81 kg/m.    ECOG FS: 1 Filed Weights   08/08/16 1431  Weight: 179 lb 8 oz (81.4 kg)   GENERAL: Patient is a well appearing female in no acute distress HEENT:  Sclerae anicteric.  Oropharynx clear and moist. No ulcerations or evidence of oropharyngeal candidiasis. Neck is supple.  NODES:  No cervical, supraclavicular, or axillary lymphadenopathy palpated.  BREAST EXAM:  Bilateral breasts without nodules, masses, skin or nipple changes, left lumpectomy site well healed, with moderate distortion, no nodularity or sign of recurrence.  Benign bilateral breast exam.   LUNGS:  Clear to auscultation bilaterally.  No wheezes or rhonchi. HEART:  Regular rate and rhythm. No murmur appreciated. ABDOMEN:  Soft, nontender.  Positive, normoactive bowel sounds. No organomegaly palpated. MSK:  No focal spinal tenderness to palpation. Full range of motion bilaterally in the upper extremities. EXTREMITIES:  No peripheral edema.   SKIN:  Clear with no obvious rashes or skin changes. No nail dyscrasia. NEURO:  Nonfocal. Well oriented.  Appropriate affect.     LAB RESULTS:    Lab Results  Component Value Date   WBC 5.7 03/08/2016    NEUTROABS 5.9 05/03/2015   HGB 12.8 03/08/2016   HCT 38.7 03/08/2016   MCV 86.8 03/08/2016   PLT 336 03/08/2016      Chemistry      Component Value Date/Time   NA 137 03/08/2016 1140   NA 137 11/12/2015   NA 140 05/03/2015 1402   K 3.6 03/08/2016  1140   K 4.0 05/03/2015 1402   CL 105 03/08/2016 1140   CL 105 10/09/2012 1103   CO2 23 03/08/2016 1140   CO2 25 05/03/2015 1402   BUN 34 (H) 03/08/2016 1140   BUN 25 (A) 11/12/2015   BUN 20.6 05/03/2015 1402   CREATININE 1.29 (H) 03/08/2016 1140   CREATININE 1.5 (H) 05/03/2015 1402   GLU 90 11/12/2015      Component Value Date/Time   CALCIUM 9.9 03/08/2016 1140   CALCIUM 10.1 05/03/2015 1402   ALKPHOS 77 11/09/2015   ALKPHOS 77 05/03/2015 1402   AST 26 11/09/2015   AST 17 05/03/2015 1402   ALT 18 11/09/2015   ALT <9 05/03/2015 1402   BILITOT 0.59 05/03/2015 1402       STUDIES:     ASSESSMENT: 66 y.o.  woman   (1)  status post left lumpectomy 05/31/2011, for ductal carcinoma in situ, involving 2 sites, both estrogen and progesterone receptor positive, high-grade, with negative margins.   (2)  Completed adjuvant radiation May 2013  (3)  tamoxifen from May 2013-May 2018  (4)  status post right nephrectomy in April 2014 with benign pathology  (5)  Hypercalcemia-- intermittent -- resolved  (6)  history of hypokalemia, on triamterene/HCTZ for history of hypertension - normalized and stable with oral potassium supplementation    PLAN: Chemika is doing well today.  She will go ahead and stop Tamoxifen at the end of the month.  She is seeing [facility] regarding her urinary frequency and frequent UTIs.  She will continue following up with them regarding this.  I gave her information on vaginal dryness, mona lisa touch, healthy eating, and a booklet on how to reduce her cancer risk.    Deanna Burke will return in one year for survivorship LTS f/u.    A total of (30) minutes of face-to-face time was spent  with this patient with greater than 50% of that time in counseling and care-coordination.    Scot Dock, NP   08/08/2016 2:55 PM

## 2016-08-09 ENCOUNTER — Encounter: Payer: Self-pay | Admitting: Adult Health

## 2016-09-04 NOTE — Progress Notes (Signed)
This encounter was created in error - please disregard.

## 2017-04-26 ENCOUNTER — Other Ambulatory Visit: Payer: Self-pay | Admitting: Oncology

## 2017-04-26 DIAGNOSIS — Z9889 Other specified postprocedural states: Secondary | ICD-10-CM

## 2017-05-16 ENCOUNTER — Ambulatory Visit
Admission: RE | Admit: 2017-05-16 | Discharge: 2017-05-16 | Disposition: A | Discharge status: Home / Self Care | Payer: Medicare Other | Source: Ambulatory Visit | Attending: Oncology | Admitting: Oncology

## 2017-05-16 DIAGNOSIS — Z9889 Other specified postprocedural states: Secondary | ICD-10-CM

## 2017-08-10 ENCOUNTER — Encounter: Payer: Medicare Other | Admitting: Adult Health

## 2017-08-16 ENCOUNTER — Telehealth: Payer: Self-pay | Admitting: Adult Health

## 2017-08-16 ENCOUNTER — Inpatient Hospital Stay: Payer: Medicare Other | Attending: Adult Health | Admitting: Adult Health

## 2017-08-16 VITALS — BP 120/71 | HR 93 | Temp 97.7°F | Resp 18 | Ht 64.0 in | Wt 181.4 lb

## 2017-08-16 DIAGNOSIS — E876 Hypokalemia: Secondary | ICD-10-CM | POA: Diagnosis not present

## 2017-08-16 DIAGNOSIS — Z803 Family history of malignant neoplasm of breast: Secondary | ICD-10-CM | POA: Diagnosis not present

## 2017-08-16 DIAGNOSIS — Z86000 Personal history of in-situ neoplasm of breast: Secondary | ICD-10-CM | POA: Diagnosis not present

## 2017-08-16 DIAGNOSIS — Z86711 Personal history of pulmonary embolism: Secondary | ICD-10-CM | POA: Diagnosis not present

## 2017-08-16 DIAGNOSIS — G4730 Sleep apnea, unspecified: Secondary | ICD-10-CM | POA: Diagnosis not present

## 2017-08-16 DIAGNOSIS — Z923 Personal history of irradiation: Secondary | ICD-10-CM | POA: Insufficient documentation

## 2017-08-16 DIAGNOSIS — Z905 Acquired absence of kidney: Secondary | ICD-10-CM | POA: Insufficient documentation

## 2017-08-16 DIAGNOSIS — I10 Essential (primary) hypertension: Secondary | ICD-10-CM | POA: Insufficient documentation

## 2017-08-16 DIAGNOSIS — M129 Arthropathy, unspecified: Secondary | ICD-10-CM | POA: Insufficient documentation

## 2017-08-16 DIAGNOSIS — Z87442 Personal history of urinary calculi: Secondary | ICD-10-CM | POA: Insufficient documentation

## 2017-08-16 DIAGNOSIS — K5190 Ulcerative colitis, unspecified, without complications: Secondary | ICD-10-CM | POA: Insufficient documentation

## 2017-08-16 DIAGNOSIS — C50412 Malignant neoplasm of upper-outer quadrant of left female breast: Secondary | ICD-10-CM

## 2017-08-16 DIAGNOSIS — Z801 Family history of malignant neoplasm of trachea, bronchus and lung: Secondary | ICD-10-CM | POA: Diagnosis not present

## 2017-08-16 DIAGNOSIS — Z170 Estrogen receptor positive status [ER+]: Secondary | ICD-10-CM

## 2017-08-16 NOTE — Telephone Encounter (Signed)
Gave avs and calendar ° °

## 2017-08-16 NOTE — Progress Notes (Signed)
CLINIC:  Survivorship   REASON FOR VISIT:  Routine follow-up for history of breast cancer.   BRIEF ONCOLOGIC HISTORY:   (1)  status post left lumpectomy 05/31/2011, for ductal carcinoma in situ, involving 2 sites, both estrogen and progesterone receptor positive, high-grade, with negative margins.   (2)  Completed adjuvant radiation May 2013  (3)  tamoxifen from May 2013-May 2018  (4)  status post right nephrectomy in April 2014 with benign pathology  (5)  Hypercalcemia-- intermittent -- resolved  (6)  history of hypokalemia, on triamterene/HCTZ for history of hypertension - normalized and stable with oral potassium supplementation    INTERVAL HISTORY:  Ms. Kinsella presents to the [facility] today for routine follow-up for her history of breast cancer.  Overall, she reports feeling quite well. She is up to date with her cancer screenings.  She sees her PCP regularly.      REVIEW OF SYSTEMS:  Review of Systems  Constitutional: Negative for appetite change, chills, fatigue, fever and unexpected weight change.  HENT:   Negative for hearing loss, lump/mass and trouble swallowing.   Eyes: Negative for eye problems and icterus.  Respiratory: Negative for chest tightness, cough and shortness of breath.   Cardiovascular: Negative for chest pain, leg swelling and palpitations.  Gastrointestinal: Negative for abdominal distention and abdominal pain.  Neurological: Negative for dizziness, extremity weakness, headaches and numbness.  Hematological: Negative for adenopathy.  Psychiatric/Behavioral: Negative for depression. The patient is not nervous/anxious.   Breast: Denies any new nodularity, masses, tenderness, nipple changes, or nipple discharge.       PAST MEDICAL/SURGICAL HISTORY:  Past Medical History:  Diagnosis Date  . Abnormal finding on cardiovascular stress test, ischemia anterolateral 02/13/2012   follow heart cath - pt told one tiny blockage that  didn't even matter  . Arthritis    hands, lumbar spine, hips, right ankle  . Breast cancer ([place])   . 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 Cataract    Left Eye  . Complication of anesthesia 1996   pt has ileostomy and had surgery in 1996 that paralyzed  . Fatigue   . H/O nephrostomy ([place]) 08/13/12  . H/O ulcerative colitis   . History of bone density study 2012  . History of recurrent UTIs 02/13/2012  . Hot flashes   . Hypertension   . Ileostomy in place, secondary to ulcerative colitis x 20 years 02/13/2012  . 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 Large bowel perforation ([place]) 1971/1996  . Phlebitis 1971  . Pulmonary embolus ([place]) 1971  . S/P ileostomy ([place])   . S/P radiation therapy 07/24/11 - 09/06/11   Left Breast/ 5000 cGy in 25 Fractions with Boost of 1000 cGy in 5 Fractions  . Scoliosis   . Sleep apnea 2009   uses cpap-setting is 1  . Small bowel obstruction ([place]) [id number]  . Small bowel perforation ([place]) 1971/1996  . Syncope, ? anginal equivilant 02/13/2012  . Torn rotator cuff 2005  . Ulcerative colitis [phone]   Past Surgical History:  Procedure Laterality Date  . ABDOMINAL HYSTERECTOMY  1996   partial  . APPENDECTOMY    . 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  ER:+, PR+,   . BREAST SURGERY  05/03/11   LEFT BREAST NEEDLE CORE BIOPSY- DCIS  . CARDIAC CATHETERIZATION    .  COLECTOMY  1973  . EXPLORATORY LAPAROTOMY  1978/1990   with lysis of adhesions  . exploratory laps     several due to abd pain related to ulcerative colitis  . EYE SURGERY  05/08/2011   left cataract removal   . ILEOSTOMY  1973  . IR GENERIC HISTORICAL  03/08/2016   IR VENOCAVAGRAM IVC 03/08/2016 Arne Cleveland, MD [facility]  . IR GENERIC HISTORICAL  03/08/2016   IR US GUIDE VASC ACCESS RIGHT 03/08/2016 Arne Cleveland, MD [facility]  . LAPAROSCOPIC  NEPHRECTOMY Right 08/14/2012   Procedure: RIGHT LAPAROSCOPIC RETROPERITONEAL LAPAROSCOPIC NEPHRECTOMY ;  Surgeon: Alexis Frock, MD;  Location: [facility];  Service: Urology;  Laterality: Right;  RIGHT LAPAROSCOPIC RETROPERITONEAL LAPAROSCOPIC NEPHRECTOMY, POSSIBLE OPEN   . LEFT HEART CATHETERIZATION WITH CORONARY ANGIOGRAM N/A 02/13/2012   Procedure: LEFT HEART CATHETERIZATION WITH CORONARY ANGIOGRAM;  Surgeon: Lorretta Harp, MD;  Location: [facility] LAB;  Service: Cardiovascular;  Laterality: N/A;  . ROTATOR CUFF REPAIR  2006   Right  . TOTAL KNEE ARTHROPLASTY Left 04/01/2015   Procedure: LEFT TOTAL KNEE ARTHROPLASTY;  Surgeon: Susa Day, MD;  Location: [facility];  Service: Orthopedics;  Laterality: Left;  . TOTAL KNEE ARTHROPLASTY Right 11/04/2015   Procedure: RIGHT TOTAL KNEE ARTHROPLASTY;  Surgeon: Susa Day, MD;  Location: [facility];  Service: Orthopedics;  Laterality: Right;  [name] VAGINOPLASTY  1975     ALLERGIES:  Allergies  Allergen Reactions  . Demerol Nausea And Vomiting  . Dilaudid [Hydromorphone Hcl] Nausea And Vomiting and Other (See Comments)    Muscle cramping  . Reglan [Metoclopramide] Other (See Comments)    Cramps in hands  . Stadol [Butorphanol Tartrate] Nausea And Vomiting    Injection  . Cephalexin Rash  . Penicillins Rash    Rash only; note also rash to Keflex Has patient had a PCN reaction causing immediate rash, facial/tongue/throat swelling, SOB or lightheadedness with hypotension: rash Has patient had a PCN reaction causing severe rash involving mucus membranes or skin necrosis: no Has patient had a PCN reaction that required hospitalization no Has patient had a PCN reaction occurring within the last 10 years: no If all of the above answers are "NO", then may proceed with Cephalosporin use.     CURRENT MEDICATIONS:  Outpatient Encounter Medications as of 08/16/2017  Medication Sig  . acetaminophen (TYLENOL) 500 MG tablet Take 1,000 mg by mouth every 6 (six)  hours as needed for moderate pain.  [name] allopurinol (ZYLOPRIM) 300 MG tablet   . aspirin 325 MG EC tablet Take 325 mg by mouth daily.  . Bismuth Subgallate 200 MG CHEW Chew 400 mg by mouth 4 (four) times daily -  with meals and at bedtime.  . furosemide (LASIX) 20 MG tablet   . Lactobacillus Rhamnosus, GG, (CULTURELLE) CAPS Take by mouth.  [name] MYORISAN 40 MG capsule   . nitrofurantoin (MACRODANTIN) 100 MG capsule Take 100 mg by mouth 2 (two) times daily. For 3 days prn UTI  . potassium chloride SA (K-DUR,KLOR-CON) 20 MEQ tablet Take 1 tablet (20 mEq total) by mouth daily.  . predniSONE (STERAPRED UNI-PAK 21 TAB) 5 MG (21) TBPK tablet   . TURMERIC PO Take 1 tablet by mouth daily.  . [DISCONTINUED] diphenhydrAMINE (BENADRYL) 25 MG tablet Take 50 mg by mouth at bedtime.  . [DISCONTINUED] ferrous sulfate 325 (65 FE) MG tablet Take 325 mg by mouth daily with breakfast.  . [DISCONTINUED] hydrOXYzine (ATARAX/VISTARIL) 25 MG tablet Take 25 mg by mouth at bedtime.  . [  DISCONTINUED] Multiple Vitamin (MULTIVITAMIN) tablet Take 1 tablet by mouth daily.  . [DISCONTINUED] PREMARIN vaginal cream Place 1 applicator vaginally 2 (two) times a week.  . [DISCONTINUED] sulfamethoxazole-trimethoprim (BACTRIM DS,SEPTRA DS) 800-160 MG tablet Take 1 tablet by mouth 2 (two) times daily. For 3 days prn UTI  . [DISCONTINUED] tamoxifen (NOLVADEX) 20 MG tablet Take 1 tablet (20 mg total) by mouth daily. (Patient not taking: Reported on 08/16/2017)  . [DISCONTINUED] triamterene-hydrochlorothiazide (MAXZIDE-25) 37.5-25 MG per tablet Take 1 tablet by mouth every morning.   No facility-administered encounter medications on file as of 08/16/2017.      ONCOLOGIC FAMILY HISTORY:  Family History  Problem Relation Age of Onset  . Cancer Other        Breast  . Atrial fibrillation Mother   . Heart attack Father   . Heart attack Paternal Uncle   . Breast cancer Maternal Grandmother   . Stomach cancer Maternal Grandfather   .  Cancer Paternal Grandmother        stomach  . Atrial fibrillation Brother     SOCIAL HISTORY:  Social History   Socioeconomic History  . Marital status: Divorced    Spouse name: Not on file  . Number of children: Not on file  . Years of education: Not on file  . Highest education level: Not on file  Occupational History  . Not on file  Social Needs  . Financial resource strain: Not on file  . Food insecurity:    Worry: Not on file    Inability: Not on file  . Transportation needs:    Medical: Not on file    Non-medical: Not on file  Tobacco Use  . Smoking status: Never Smoker  . Smokeless tobacco: Never Used  Substance and Sexual Activity  . Alcohol use: No  . Drug use: No  . Sexual activity: Never    Birth control/protection: Other-see comments    Comment: MENARCHE 9, [place], MENOPAUSE 1996, HRT X 16 YEARS- STOPPED 04/21/11, BC X 2 YEARS  Lifestyle  . Physical activity:    Days per week: Not on file    Minutes per session: Not on file  . Stress: Not on file  Relationships  . Social connections:    Talks on phone: Not on file    Gets together: Not on file    Attends religious service: Not on file    Active member of club or organization: Not on file    Attends meetings of clubs or organizations: Not on file    Relationship status: Not on file  . Intimate partner violence:    Fear of current or ex partner: Not on file    Emotionally abused: Not on file    Physically abused: Not on file    Forced sexual activity: Not on file  Other Topics Concern  . Not on file  Social History Narrative   Lives alone.  Does not use any assist device.        PHYSICAL EXAMINATION:  Vital Signs: Vitals:   08/16/17 1453  BP: 120/71  Pulse: 93  Resp: 18  Temp: 97.7 F (36.5 C)  SpO2: 97%   Filed Weights   08/16/17 1453  Weight: 181 lb 6.4 oz (82.3 kg)   General: Well-nourished, well-appearing female in no acute distress.  Unaccompanied today.   HEENT: Head is  normocephalic.  Pupils equal and reactive to light. Conjunctivae clear without exudate.  Sclerae anicteric. Oral mucosa is pink, moist.  Oropharynx is  pink without lesions or erythema.  Lymph: No cervical, supraclavicular, or infraclavicular lymphadenopathy noted on palpation.  Cardiovascular: Regular rate and rhythm.[name] Respiratory: Clear to auscultation bilaterally. Chest expansion symmetric; breathing non-labored.  Breast Exam:  -Left breast: No appreciable masses on palpation. No skin redness, thickening, or peau d'orange appearance; no nipple retraction or nipple discharge; mild distortion in symmetry at previous lumpectomy site well healed scar without erythema or nodularity.  -Right breast: No appreciable masses on palpation. No skin redness, thickening, or peau d'orange appearance; no nipple retraction or nipple discharge;  -Axilla: No axillary adenopathy bilaterally.  GI: Abdomen soft and round; non-tender, non-distended. Bowel sounds normoactive. No hepatosplenomegaly.   GU: Deferred.  Neuro: No focal deficits. Steady gait.  Psych: Mood and affect normal and appropriate for situation.  MSK: No focal spinal tenderness to palpation, full range of motion in bilateral upper extremities Extremities: No edema. Skin: Warm and dry.  LABORATORY DATA:  None for this visit   DIAGNOSTIC IMAGING:  Most recent mammogram:      ASSESSMENT AND PLAN:  Ms.. Barwick is a pleasant 67 y.o. 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  [facility] and routine follow-up.   1. History of breast cancer:  Ms. Trimmer is currently clinically and radiographically without evidence of disease or recurrence of breast cancer. She will be due for mammogram in 04/2018.  She will return in one year for LTS follow up. I  encouraged her to call me with any questions or concerns before her next visit at the cancer center, and I would be happy to see her sooner, if needed.    2. Bone health:  Given Ms. Droz age, history of breast cancer, she is at risk for bone demineralization. I will defer to her PCP regarding bone density testing and management.  She did take Tamoxifen x 5 years which has a protective effect on the bones.  3. Cancer screening:  Due to Ms. Molenda history and her age, she should receive screening for skin cancers, colon cancer. She was encouraged to follow-up with her PCP for appropriate cancer screenings.   4. Health maintenance and wellness promotion: Ms. Malbrough was encouraged to consume 5-7 servings of fruits and vegetables per day. She was also encouraged to engage in moderate to vigorous exercise for 30 minutes per day most days of the week. She was instructed to limit her alcohol consumption and continue to abstain from tobacco use.     Dispo:  -Return to cancer center LTS follow up in one year -Mammogram in 04/2018   A total of (20) minutes of face-to-face time was spent with this patient with greater than 50% of that time in counseling and care-coordination.   Gardenia Phlegm, [place] [phone]   Note: PRIMARY CARE PROVIDER [name], [place] [phone]

## 2017-08-18 ENCOUNTER — Encounter: Payer: Self-pay | Admitting: Adult Health

## 2017-09-05 NOTE — Progress Notes (Signed)
Office Visit Note  Patient: Deanna Burke             Date of Birth: 1950/10/24           MRN: [id number]             PCP: [name], MD Referring: [place] , PA-C Visit Date: 09/19/2017 Occupation: Retired Therapist, sports and occupational health    Subjective:  New Patient (Initial Visit) (Gout left great toe)   History of Present Illness: Deanna Burke is a 67 y.o. female with history of gout, and ulcerative colitis.  She had ileostomy after resection of the colon in 1973.  She states she had no joint pain until February 2019 when she woke up with the left foot pain and swelling which gradually got worse.  She was initially seen by her PCP who did lab work and it came positive for uric acid at 11.0.  She was given a prednisone Dosepak and also a cortisone injection to her left first MTP joint.  She states her symptoms improved but then the recurred.  She was placed on colchicine twice daily which caused diarrhea and it was discontinued.  She was placed on allopurinol a few weeks later and she has been taking 300 mg of allopurinol.  She states her repeat uric acid was 4.5 in April but she continues to have recurrent flares.  She has had 7 courses of prednisone so far and also is having intramuscular cortisone injections .  None of the other joints are painful or swollen.  Activities of Daily Living:  Patient reports morning stiffness for 10 minutes.   Patient Denies nocturnal pain.  Difficulty dressing/grooming: Denies Difficulty climbing stairs: Denies Difficulty getting out of chair: Denies Difficulty using hands for taps, buttons, cutlery, and/or writing: Denies   Review of Systems  Constitutional: Positive for fatigue. Negative for activity change, night sweats, weight gain and weight loss.  HENT: Positive for mouth dryness. Negative for mouth sores, trouble swallowing, trouble swallowing and nose dryness.   Eyes: Negative for pain, redness, visual disturbance and dryness.   Respiratory: Negative for cough, shortness of breath and difficulty breathing.   Cardiovascular: Negative for chest pain, palpitations, hypertension, irregular heartbeat and swelling in legs/feet.  Gastrointestinal: Negative for blood in stool, constipation and diarrhea.  Endocrine: Positive for excessive thirst. Negative for increased urination.  Genitourinary: Negative for difficulty urinating and vaginal dryness.  Musculoskeletal: Positive for arthralgias, joint pain, morning stiffness and muscle tenderness. Negative for joint swelling, myalgias, muscle weakness and myalgias.  Skin: Negative for color change, rash, hair loss, skin tightness, ulcers and sensitivity to sunlight.  Allergic/Immunologic: Negative for susceptible to infections.  Neurological: Negative for dizziness, numbness, memory loss, night sweats and weakness.  Hematological: Negative for bruising/bleeding tendency and swollen glands.  Psychiatric/Behavioral: Positive for sleep disturbance. Negative for depressed mood. The patient is not nervous/anxious.     PMFS History:  Patient Active Problem List   Diagnosis Date Noted  . Traumatic complete tear of right rotator cuff 09/19/2017  . History of osteoporosis 09/19/2017  . Vitamin D deficiency 09/19/2017  . Hypercholesterolemia 09/19/2017  . OSA on CPAP 09/19/2017  . Other insomnia 09/19/2017  . Recurrent UTI 09/19/2017  . Primary osteoarthritis of both hands 09/19/2017  . S/P TKR (total knee replacement), bilateral 09/19/2017  . Primary osteoarthritis of right knee 11/04/2015  . Right knee DJD 11/04/2015  . Ulcerative colitis ([place]) 05/10/2015  . Ileostomy present ([place]) 05/10/2015  . H/O unilateral  nephrectomy 05/10/2015  . Essential hypertension, benign 04/07/2015  . Left knee DJD 04/01/2015  . History of pulmonary embolism   . SBO (small bowel obstruction) ([place]) 12/30/2013  . Malignant neoplasm of upper-outer quadrant of left breast in female, estrogen receptor  positive ([place]) 04/07/2013  . Neoplasm of left breast, primary tumor staging category Tis: ductal carcinoma in situ (DCIS) 01/27/2013  . Pyelonephritis, acute 06/05/2012  . Hydronephrosis of right kidney 06/05/2012  . Hyponatremia 06/05/2012  . Hypokalemia 06/05/2012  . Acute kidney injury ([place]) 06/05/2012  . 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 Abnormal finding on cardiovascular stress test, ischemia anterolateral 02/13/2012  . Syncope, ? anginal equivilant 02/13/2012  . Ileostomy in place, secondary to ulcerative colitis x 20 years 02/13/2012  . History of recurrent UTIs 02/13/2012    Past Medical History:  Diagnosis Date  . Abnormal finding on cardiovascular stress test, ischemia anterolateral 02/13/2012   follow heart cath - pt told one tiny blockage that didn't even matter  . Arthritis    hands, lumbar spine, hips, right ankle  . Breast cancer ([place])   . 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 Cataract    Left Eye  . Complication of anesthesia 1996   pt has ileostomy and had surgery in 1996 that paralyzed  . Fatigue   . H/O nephrostomy 08/13/12  . H/O ulcerative colitis   . History of bone density study 2012  . History of recurrent UTIs 02/13/2012  . Hot flashes   . Hypertension   . Ileostomy in place, secondary to ulcerative colitis x 20 years 02/13/2012  . 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 Large bowel perforation ([place]) 1971/1996  . Phlebitis 1971  . Pulmonary embolus ([place]) 1971  . S/P ileostomy ([place])   . S/P radiation therapy 07/24/11 - 09/06/11   Left Breast/ 5000 cGy in 25 Fractions with Boost of 1000 cGy in 5 Fractions  . Scoliosis   . Sleep apnea 2009   uses cpap-setting is 1  . Small bowel obstruction ([place]) [id number]  . Small bowel perforation ([place]) 1971/1996  . Syncope, ? anginal equivilant 02/13/2012  . Torn rotator cuff 2005  . Ulcerative colitis [phone]    Family History    Problem Relation Age of Onset  . Cancer Other        Breast  . Atrial fibrillation Mother   . Heart attack Father   . Heart attack Paternal Uncle   . Breast cancer Maternal Grandmother   . Stomach cancer Maternal Grandfather   . Cancer Paternal Grandmother        stomach  . Atrial fibrillation Brother    Past Surgical History:  Procedure Laterality Date  . ABDOMINAL HYSTERECTOMY  1996   partial  . APPENDECTOMY    . 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  ER:+, PR+,   . BREAST SURGERY  05/03/11   LEFT BREAST NEEDLE CORE BIOPSY- DCIS  . CARDIAC CATHETERIZATION    . CATARACT EXTRACTION Left   . COLECTOMY  1973  . EXPLORATORY LAPAROTOMY  1978/1990   with lysis of adhesions  . exploratory laps     several due to abd pain related to ulcerative colitis  . EYE SURGERY  05/08/2011   left cataract removal   . ILEOSTOMY  1973  .  IR GENERIC HISTORICAL  03/08/2016   IR VENOCAVAGRAM IVC 03/08/2016 Arne Cleveland, MD [facility]  . IR GENERIC HISTORICAL  03/08/2016   IR US GUIDE VASC ACCESS RIGHT 03/08/2016 Arne Cleveland, MD [facility]  . LAPAROSCOPIC NEPHRECTOMY Right 08/14/2012   Procedure: RIGHT LAPAROSCOPIC RETROPERITONEAL LAPAROSCOPIC NEPHRECTOMY ;  Surgeon: Alexis Frock, MD;  Location: [facility];  Service: Urology;  Laterality: Right;  RIGHT LAPAROSCOPIC RETROPERITONEAL LAPAROSCOPIC NEPHRECTOMY, POSSIBLE OPEN   . LEFT HEART CATHETERIZATION WITH CORONARY ANGIOGRAM N/A 02/13/2012   Procedure: LEFT HEART CATHETERIZATION WITH CORONARY ANGIOGRAM;  Surgeon: Lorretta Harp, MD;  Location: [facility];  Service: Cardiovascular;  Laterality: N/A;  . ROTATOR CUFF REPAIR  2006   Right  . TOTAL KNEE ARTHROPLASTY Left 04/01/2015   Procedure: LEFT TOTAL KNEE ARTHROPLASTY;  Surgeon: Susa Day, MD;  Location: [facility];  Service: Orthopedics;  Laterality: Left;  . TOTAL KNEE ARTHROPLASTY Right  11/04/2015   Procedure: RIGHT TOTAL KNEE ARTHROPLASTY;  Surgeon: Susa Day, MD;  Location: [facility];  Service: Orthopedics;  Laterality: Right;  . TOTAL SHOULDER ARTHROPLASTY    . VAGINOPLASTY  52   Social History   Social History Narrative   Lives alone.  Does not use any assist device.       Objective: Vital Signs: BP 127/74 (BP Location: Right Arm, Patient Position: Sitting, Cuff Size: Normal)   Pulse (!) 103   Resp 16   Ht 5' 4"  (1.626 m)   Wt 183 lb (83 kg)   BMI 31.41 kg/m    Physical Exam  Constitutional: She is oriented to person, place, and time. She appears well-developed and well-nourished.  HENT:  Head: Normocephalic and atraumatic.  Eyes: Conjunctivae and EOM are normal.  Neck: Normal range of motion.  Cardiovascular: Normal rate, regular rhythm, normal heart sounds and intact distal pulses.  Pulmonary/Chest: Effort normal and breath sounds normal.  Abdominal: Soft. Bowel sounds are normal.  She has an ileostomy.  Lymphadenopathy:    She has no cervical adenopathy.  Neurological: She is alert and oriented to person, place, and time.  Skin: Skin is warm and dry. Capillary refill takes less than 2 seconds.  Psychiatric: She has a normal mood and affect. Her behavior is normal.  Nursing note and vitals reviewed.    Musculoskeletal Exam: C-spine thoracic lumbar spine good range of motion.  Shoulder joints elbow joints wrist joints are good range of motion.  She has some DIP PIP thickening in her hands consistent with osteoarthritis.  Hip joints were in good range of motion.  She had bilateral total knee replacement with incomplete extension.  She has some warmth and redness over her left first MTP joint.  She had bilateral first MTP and PIP and DIP thickening consistent with osteoarthritis.  CDAI Exam: No CDAI exam completed.    Investigation: No additional findings. CBC Latest Ref Rng & Units 03/08/2016 11/09/2015 11/07/2015  WBC 4.0 - 10.5 K/uL 5.7 5.6 8.8    Hemoglobin 12.0 - 15.0 g/dL 12.8 11.4(A) 11.5(L)  Hematocrit 36.0 - 46.0 % 38.7 34(A) 34.2(L)  Platelets 150 - 400 K/uL 336 340 268   CMP Latest Ref Rng & Units 03/08/2016 11/12/2015 11/09/2015  Glucose 65 - 99 mg/dL 96 - -  BUN 6 - 20 mg/dL 34(H) 25(A) 22(A)  Creatinine 0.44 - 1.00 mg/dL 1.29(H) 1.1 1.1  Sodium 135 - 145 mmol/L 137 137 133(A)  Potassium 3.5 - 5.1 mmol/L 3.6 3.6 3.3(A)  Chloride 101 - 111 mmol/L 105 - -  CO2 22 - 32 mmol/L 23 - -  Calcium 8.9 - 10.3 mg/dL 9.9 - -  Total Protein 6.4 - 8.3 g/dL - - -  Total Bilirubin 0.20 - 1.20 mg/dL - - -  Alkaline Phos 25 - 125 U/L - - 77  AST 13 - 35 U/L - - 26  ALT 7 - 35 U/L - - 18    Imaging: No results found.  Speciality Comments: No specialty comments available.    Procedures:  No procedures performed Allergies: Demerol; Dilaudid [hydromorphone hcl]; Reglan [metoclopramide]; Stadol [butorphanol tartrate]; Cephalexin; and Penicillins   Assessment / Plan:     Visit Diagnoses: Pain in left foot -patient has been having recurrent pain and swelling in her left first MTP joint.  She had tried colchicine but discontinued due to diarrhea.  She has had 7 courses of prednisone and intramuscular steroids since February.  She is currently on prednisone 5 mg a day.  She has been on allopurinol 300 mg a day.  Her lower last uric acid in April was in desirable range.  I am uncertain if that uric acid was obtained at the time of flare or after the flare.  We had discussion that the uric acid levels could be low at the time of flare.  Plan: Uric acid, Cyclic citrul peptide antibody, IgG, Rheumatoid factor  Idiopathic chronic gout of multiple sites without tophus - Allopurinol 300 mg daily, Prednisone 5 mg.  My plan is to try colchicine 0.6 mg p.o. daily and observe if she develops any side effects like diarrhea.  I also discussed that she can decrease colchicine to half a tablet daily.  If that does not work we may have to keep her on  low-dose prednisone until her uric acid is a stable.  Ideally she should have uric acid less than 6 to prevent any gout flares.  Dietary modifications were also discussed.  Hyperuricemia - Uric acid 11.0 on 06/10/17 and uric acid 4.5 on 07/27/17  Medication management - Plan: CBC with Differential/Platelet, COMPLETE METABOLIC PANEL WITH GFR today.  History of kidney stones - His stone analysis positive for uric acid  History of chronic kidney disease-patient has been followed by nephrology.  Traumatic complete tear of right rotator cuff, sequela - [facility] status post repair.  She has good planes range of motion of her shoulder joints.  Primary osteoarthritis of both hands-she had mild OA changes in her bilateral hands but not having much discomfort.  S/P TKR (total knee replacement), bilateral-she has limited extension without much discomfort.  History of osteoporosis-she is not taking any treatment currently.  Vitamin D deficiency-she cannot take vitamin D per patient due to renal insufficiency.  History of breast cancer - s/p lumpectomy and RT 03/2011  Hypercholesterolemia  Essential hypertension  History of anemia  OSA on CPAP  Other insomnia  Recurrent UTI  H/O unilateral nephrectomy - congenital anomaly  Ileostomy in place, secondary to ulcerative colitis x 20 years.  According to patient the ulcerative colitis was resolved after ileostomy.   Orders: Orders Placed This Encounter  Procedures  . CBC with Differential/Platelet  . COMPLETE METABOLIC PANEL WITH GFR  . Uric acid  . Cyclic citrul peptide antibody, IgG  . Rheumatoid factor   Meds ordered this encounter  Medications  . predniSONE (DELTASONE) 5 MG tablet    Sig: Take 1 tablet (5 mg total) by mouth daily with breakfast.    Dispense:  30 tablet    Refill:  0  .  colchicine 0.6 MG tablet    Sig: Take 1 tablet (0.6 mg total) by mouth daily.    Dispense:  30 tablet    Refill:  2    Face-to-face  time spent with patient was 45 minutes.> 50% of time was spent in counseling and coordination of care.  Follow-Up Instructions: Return for Osteoarthritis, Gout.   Bo Merino, MD  Note - This record has been created using Editor, commissioning.  Chart creation errors have been sought, but may not always  have been located. Such creation errors do not reflect on  the standard of medical care.

## 2017-09-19 ENCOUNTER — Encounter: Payer: Self-pay | Admitting: Rheumatology

## 2017-09-19 ENCOUNTER — Ambulatory Visit: Payer: Medicare Other | Admitting: Rheumatology

## 2017-09-19 VITALS — BP 127/74 | HR 103 | Resp 16 | Ht 64.0 in | Wt 183.0 lb

## 2017-09-19 DIAGNOSIS — M79672 Pain in left foot: Secondary | ICD-10-CM | POA: Diagnosis not present

## 2017-09-19 DIAGNOSIS — Z853 Personal history of malignant neoplasm of breast: Secondary | ICD-10-CM

## 2017-09-19 DIAGNOSIS — G4709 Other insomnia: Secondary | ICD-10-CM

## 2017-09-19 DIAGNOSIS — Z905 Acquired absence of kidney: Secondary | ICD-10-CM

## 2017-09-19 DIAGNOSIS — E559 Vitamin D deficiency, unspecified: Secondary | ICD-10-CM

## 2017-09-19 DIAGNOSIS — M1A09X0 Idiopathic chronic gout, multiple sites, without tophus (tophi): Secondary | ICD-10-CM | POA: Diagnosis not present

## 2017-09-19 DIAGNOSIS — S46011A Strain of muscle(s) and tendon(s) of the rotator cuff of right shoulder, initial encounter: Secondary | ICD-10-CM | POA: Insufficient documentation

## 2017-09-19 DIAGNOSIS — M19042 Primary osteoarthritis, left hand: Secondary | ICD-10-CM

## 2017-09-19 DIAGNOSIS — Z932 Ileostomy status: Secondary | ICD-10-CM

## 2017-09-19 DIAGNOSIS — Z96653 Presence of artificial knee joint, bilateral: Secondary | ICD-10-CM | POA: Diagnosis not present

## 2017-09-19 DIAGNOSIS — Z862 Personal history of diseases of the blood and blood-forming organs and certain disorders involving the immune mechanism: Secondary | ICD-10-CM

## 2017-09-19 DIAGNOSIS — S46011S Strain of muscle(s) and tendon(s) of the rotator cuff of right shoulder, sequela: Secondary | ICD-10-CM

## 2017-09-19 DIAGNOSIS — Z79899 Other long term (current) drug therapy: Secondary | ICD-10-CM

## 2017-09-19 DIAGNOSIS — Z8739 Personal history of other diseases of the musculoskeletal system and connective tissue: Secondary | ICD-10-CM

## 2017-09-19 DIAGNOSIS — I10 Essential (primary) hypertension: Secondary | ICD-10-CM

## 2017-09-19 DIAGNOSIS — M19041 Primary osteoarthritis, right hand: Secondary | ICD-10-CM

## 2017-09-19 DIAGNOSIS — E790 Hyperuricemia without signs of inflammatory arthritis and tophaceous disease: Secondary | ICD-10-CM

## 2017-09-19 DIAGNOSIS — Z87448 Personal history of other diseases of urinary system: Secondary | ICD-10-CM

## 2017-09-19 DIAGNOSIS — Z87442 Personal history of urinary calculi: Secondary | ICD-10-CM | POA: Diagnosis not present

## 2017-09-19 DIAGNOSIS — Z9989 Dependence on other enabling machines and devices: Secondary | ICD-10-CM

## 2017-09-19 DIAGNOSIS — E7800 Pure hypercholesterolemia, unspecified: Secondary | ICD-10-CM

## 2017-09-19 DIAGNOSIS — N390 Urinary tract infection, site not specified: Secondary | ICD-10-CM

## 2017-09-19 DIAGNOSIS — G4733 Obstructive sleep apnea (adult) (pediatric): Secondary | ICD-10-CM

## 2017-09-19 MED ORDER — COLCHICINE 0.6 MG PO TABS: 0.6000 mg | ORAL_TABLET | Freq: Every day | ORAL | 2 refills | Status: DC

## 2017-09-19 MED ORDER — PREDNISONE 5 MG PO TABS: 5.0000 mg | ORAL_TABLET | Freq: Every day | ORAL | 0 refills | Status: DC

## 2017-09-19 NOTE — Patient Instructions (Signed)
Please take colchicine 0.6 mg, half to 1 tablet p.o. daily as tolerated.  If you have side effects from colchicine then you may discontinue colchicine and stay on prednisone 5 mg p.o. daily.

## 2017-09-20 LAB — COMPLETE METABOLIC PANEL WITH GFR
AG Ratio: 1.5 (calc) (ref 1.0–2.5)
ALBUMIN MSPROF: 4.4 g/dL (ref 3.6–5.1)
ALKALINE PHOSPHATASE (APISO): 115 U/L (ref 33–130)
ALT: 16 U/L (ref 6–29)
AST: 15 U/L (ref 10–35)
BILIRUBIN TOTAL: 0.6 mg/dL (ref 0.2–1.2)
BUN/Creatinine Ratio: 22 (calc) (ref 6–22)
BUN: 27 mg/dL — AB (ref 7–25)
CHLORIDE: 103 mmol/L (ref 98–110)
CO2: 26 mmol/L (ref 20–32)
Calcium: 10 mg/dL (ref 8.6–10.4)
Creat: 1.21 mg/dL — ABNORMAL HIGH (ref 0.50–0.99)
GFR, Est African American: 53 mL/min/{1.73_m2} — ABNORMAL LOW (ref >=60)
GFR, Est Non African American: 46 mL/min/{1.73_m2} — ABNORMAL LOW (ref >=60)
GLUCOSE: 106 mg/dL — AB (ref 65–99)
Globulin: 2.9 g/dL (calc) (ref 1.9–3.7)
Potassium: 4.4 mmol/L (ref 3.5–5.3)
SODIUM: 139 mmol/L (ref 135–146)
Total Protein: 7.3 g/dL (ref 6.1–8.1)

## 2017-09-20 LAB — RHEUMATOID FACTOR: Rhuematoid fact SerPl-aCnc: <14 IU/mL (ref <14)

## 2017-09-20 LAB — URIC ACID: URIC ACID, SERUM: 4.9 mg/dL (ref 2.5–7.0)

## 2017-09-20 LAB — CBC WITH DIFFERENTIAL/PLATELET
BASOS PCT: 0.4 %
Basophils Absolute: 36 cells/uL (ref 0–200)
Eosinophils Absolute: 9 cells/uL — ABNORMAL LOW (ref 15–500)
Eosinophils Relative: 0.1 %
HCT: 40.6 % (ref 35.0–45.0)
Hemoglobin: 13.3 g/dL (ref 11.7–15.5)
Lymphs Abs: 675 cells/uL — ABNORMAL LOW (ref 850–3900)
MCH: 28.9 pg (ref 27.0–33.0)
MCHC: 32.8 g/dL (ref 32.0–36.0)
MCV: 88.1 fL (ref 80.0–100.0)
MONOS PCT: 2.6 %
MPV: 9.9 fL (ref 7.5–12.5)
NEUTROS PCT: 89.4 %
Neutro Abs: 8046 cells/uL — ABNORMAL HIGH (ref 1500–7800)
PLATELETS: 393 10*3/uL (ref 140–400)
RBC: 4.61 10*6/uL (ref 3.80–5.10)
RDW: 14.3 % (ref 11.0–15.0)
TOTAL LYMPHOCYTE: 7.5 %
WBC mixed population: 234 cells/uL (ref 200–950)
WBC: 9 10*3/uL (ref 3.8–10.8)

## 2017-09-20 LAB — CYCLIC CITRUL PEPTIDE ANTIBODY, IGG: Cyclic Citrullin Peptide Ab: <16 UNITS

## 2017-09-20 NOTE — Progress Notes (Signed)
Labs are stable.

## 2017-09-21 NOTE — Progress Notes (Signed)
Notified pt of lab results 

## 2017-10-04 NOTE — Progress Notes (Signed)
Office Visit Note  Patient: Deanna Burke             Date of Birth: 12-11-1950           MRN: [id number]             PCP: [name], MD Referring: [name], MD Visit Date: 10/17/2017 Occupation: @GUAROCC @    Subjective:  Left  Hip pain.  History of Present Illness: Deanna Burke is a 67 y.o. female history of gouty arthropathy.  She states she has not had any gout flares since she had it colchicine.  She has been having discomfort in her bilateral trochanteric area.  She has nocturnal pain over bilateral trochanteric area.  She has been also having discomfort with range of motion of her left shoulder.  She states she does have history of lipoma over her left shoulder.  Her bilateral total knee replacement is doing well.  Activities of Daily Living:  Patient reports morning stiffness for 10 minutes.   Patient Reports nocturnal pain.  Difficulty dressing/grooming: Denies Difficulty climbing stairs: Reports Difficulty getting out of chair: Reports Difficulty using hands for taps, buttons, cutlery, and/or writing: Denies   Review of Systems  Constitutional: Negative for fatigue, fever, night sweats, weight gain and weight loss.  HENT: Negative for ear pain, mouth sores, trouble swallowing, trouble swallowing, mouth dryness and nose dryness.   Eyes: Negative for pain, redness, visual disturbance and dryness.  Respiratory: Negative for cough, shortness of breath and difficulty breathing.   Cardiovascular: Negative for chest pain, palpitations, hypertension, irregular heartbeat and swelling in legs/feet.  Gastrointestinal: Negative for blood in stool, constipation and diarrhea.  Endocrine: Negative for increased urination.  Genitourinary: Negative for difficulty urinating and vaginal dryness.  Musculoskeletal: Positive for arthralgias, joint pain and morning stiffness. Negative for joint swelling, myalgias, muscle weakness, muscle tenderness and myalgias.    Skin: Negative for color change, rash, hair loss, skin tightness, ulcers and sensitivity to sunlight.  Allergic/Immunologic: Negative for susceptible to infections.  Neurological: Negative for dizziness, numbness, memory loss, night sweats and weakness.  Hematological: Negative for bruising/bleeding tendency and swollen glands.  Psychiatric/Behavioral: Positive for sleep disturbance. Negative for depressed mood. The patient is not nervous/anxious.     PMFS History:  Patient Active Problem List   Diagnosis Date Noted  . History of chronic kidney disease 10/08/2017  . History of kidney stones 10/08/2017  . Traumatic complete tear of right rotator cuff 09/19/2017  . History of osteoporosis 09/19/2017  . Vitamin D deficiency 09/19/2017  . Hypercholesterolemia 09/19/2017  . OSA on CPAP 09/19/2017  . Other insomnia 09/19/2017  . Recurrent UTI 09/19/2017  . Primary osteoarthritis of both hands 09/19/2017  . S/P TKR (total knee replacement), bilateral 09/19/2017  . Ulcerative colitis ([place]) 05/10/2015  . 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 H/O unilateral nephrectomy 05/10/2015  . Essential hypertension, benign 04/07/2015  . History of pulmonary embolism   . SBO (small bowel obstruction) ([place]) 12/30/2013  . Malignant neoplasm of upper-outer quadrant of left breast in female, estrogen receptor positive ([place]) 04/07/2013  . Neoplasm of left breast, primary tumor staging category Tis: ductal carcinoma in situ (DCIS) 01/27/2013  . Pyelonephritis, acute 06/05/2012  . Hydronephrosis of right kidney 06/05/2012  . Hyponatremia 06/05/2012  . Hypokalemia 06/05/2012  . Acute kidney injury ([place]) 06/05/2012  . 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 Abnormal finding on cardiovascular stress test, ischemia anterolateral 02/13/2012  .  Syncope, ? anginal equivilant 02/13/2012  . Ileostomy in place, secondary to ulcerative colitis x 20 years 02/13/2012  .  History of recurrent UTIs 02/13/2012    Past Medical History:  Diagnosis Date  . Abnormal finding on cardiovascular stress test, ischemia anterolateral 02/13/2012   follow heart cath - pt told one tiny blockage that didn't even matter  . Arthritis    hands, lumbar spine, hips, right ankle  . Breast cancer ([place])   . 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 Cataract    Left Eye  . Complication of anesthesia 1996   pt has ileostomy and had surgery in 1996 that paralyzed  . Fatigue   . H/O nephrostomy 08/13/12  . H/O ulcerative colitis   . History of bone density study 2012  . History of recurrent UTIs 02/13/2012  . Hot flashes   . Hypertension   . Ileostomy in place, secondary to ulcerative colitis x 20 years 02/13/2012  . 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 Large bowel perforation ([place]) 1971/1996  . Phlebitis 1971  . Pulmonary embolus ([place]) 1971  . S/P ileostomy ([place])   . S/P radiation therapy 07/24/11 - 09/06/11   Left Breast/ 5000 cGy in 25 Fractions with Boost of 1000 cGy in 5 Fractions  . Scoliosis   . Sleep apnea 2009   uses cpap-setting is 1  . Small bowel obstruction ([place]) [id number]  . Small bowel perforation ([place]) 1971/1996  . Syncope, ? anginal equivilant 02/13/2012  . Torn rotator cuff 2005  . Ulcerative colitis [phone]    Family History  Problem Relation Age of Onset  . Cancer Other        Breast  . Atrial fibrillation Mother   . Heart attack Father   . Heart attack Paternal Uncle   . Breast cancer Maternal Grandmother   . Stomach cancer Maternal Grandfather   . Cancer Paternal Grandmother        stomach  . Atrial fibrillation Brother    Past Surgical History:  Procedure Laterality Date  . ABDOMINAL HYSTERECTOMY  1996   partial  . APPENDECTOMY    . 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  ER:+, PR+,   . BREAST SURGERY   05/03/11   LEFT BREAST NEEDLE CORE BIOPSY- DCIS  . CARDIAC CATHETERIZATION    . CATARACT EXTRACTION Left   . COLECTOMY  1973  . EXPLORATORY LAPAROTOMY  1978/1990   with lysis of adhesions  . exploratory laps     several due to abd pain related to ulcerative colitis  . EYE SURGERY  05/08/2011   left cataract removal   . ILEOSTOMY  1973  . IR GENERIC HISTORICAL  03/08/2016   IR VENOCAVAGRAM IVC 03/08/2016 Arne Cleveland, MD [facility]  . IR GENERIC HISTORICAL  03/08/2016   IR US GUIDE VASC ACCESS RIGHT 03/08/2016 Arne Cleveland, MD [facility]  . LAPAROSCOPIC NEPHRECTOMY Right 08/14/2012   Procedure: RIGHT LAPAROSCOPIC RETROPERITONEAL LAPAROSCOPIC NEPHRECTOMY ;  Surgeon: Alexis Frock, MD;  Location: [facility];  Service: Urology;  Laterality: Right;  RIGHT LAPAROSCOPIC RETROPERITONEAL LAPAROSCOPIC NEPHRECTOMY, POSSIBLE OPEN   . LEFT HEART CATHETERIZATION WITH CORONARY ANGIOGRAM N/A 02/13/2012   Procedure: LEFT HEART CATHETERIZATION WITH CORONARY ANGIOGRAM;  Surgeon: Lorretta Harp, MD;  Location[facility];  Service: Cardiovascular;  Laterality: N/A;  . ROTATOR CUFF REPAIR  2006   Right  . TOTAL KNEE ARTHROPLASTY Left 04/01/2015   Procedure: LEFT TOTAL KNEE ARTHROPLASTY;  Surgeon: Susa Day, MD;  Location: [facility];  Service: Orthopedics;  Laterality: Left;  . TOTAL KNEE ARTHROPLASTY Right 11/04/2015   Procedure: RIGHT TOTAL KNEE ARTHROPLASTY;  Surgeon: Susa Day, MD;  Location: [facility];  Service: Orthopedics;  Laterality: Right;  . TOTAL SHOULDER ARTHROPLASTY    . VAGINOPLASTY  73   Social History   Social History Narrative   Lives alone.  Does not use any assist device.       Objective: Vital Signs: BP 122/72 (BP Location: Left Arm, Patient Position: Sitting, Cuff Size: Normal)   Pulse (!) 115   Ht 5' 4"  (1.626 m)   Wt 182 lb (82.6 kg)   BMI 31.24 kg/m    Physical Exam  Constitutional: She is oriented to person, place, and time. She appears well-developed and  well-nourished.  HENT:  Head: Normocephalic and atraumatic.  Eyes: Conjunctivae and EOM are normal.  Neck: Normal range of motion.  Cardiovascular: Normal rate, regular rhythm, normal heart sounds and intact distal pulses.  Pulmonary/Chest: Effort normal and breath sounds normal.  Abdominal: Soft. Bowel sounds are normal.  Lymphadenopathy:    She has no cervical adenopathy.  Neurological: She is alert and oriented to person, place, and time.  Skin: Skin is warm and dry. Capillary refill takes less than 2 seconds.  Psychiatric: She has a normal mood and affect. Her behavior is normal.  Nursing note and vitals reviewed.    Musculoskeletal Exam: C-spine thoracic lumbar spine good range of motion.  She had painful limited range of motion of her left shoulder.  She also had a lipoma palpable over her left shoulder.  Elbow joints wrist joints were in good range of motion.  She has DIP PIP thickening in her hands consistent with osteoarthritis.  Good range of motion of bilateral hip joints with tenderness over bilateral trochanteric bursa consistent with trochanteric bursitis.  Bilateral total knee replacements are doing well.  CDAI Exam: No CDAI exam completed.    Investigation: No additional findings.   09/19/17 CBC normal, CMP creatinine 1.21, GFR 46, RF -, anti-CCP negative, uric acid 4.9 Imaging: No results found.  Speciality Comments: No specialty comments available.    Procedures:  No procedures performed Allergies: Demerol; Dilaudid [hydromorphone hcl]; Reglan [metoclopramide]; Stadol [butorphanol tartrate]; Cephalexin; and Penicillins   Assessment / Plan:     Visit Diagnoses: Idiopathic chronic gout of multiple sites without tophus - Patient is on allopurinol 300 mg a day, colchicine 0.6 mg half tablet p.o. daily.  Patient is doing much better on combination therapy of allopurinol and colchicine.  She will continue on current regimen for right now.  In future if she has no gout  flares and her uric acid is consistently low we may change colchicine to only as needed.  Hyperuricemia - Uric acid 11.0 on 06/10/17 and uric acid 4.5 on 07/27/17  Trochanteric bursitis of both hips-is been having discomfort over bilateral trochanteric bursa.  I offered cortisone injection which she declined.  I will refer her to physical therapy.  Acute pain of left shoulder-she appears to have some tendinopathy in her left shoulder with limited range of motion.  She declined injection.  I will refer to physical therapy.  Primary osteoarthritis of both hands-joint protection muscle strengthening was discussed.  S/P TKR (total knee replacement), bilateral-doing well.  Traumatic complete tear of right rotator cuff, sequela  Other medical  problems are listed as follows:  H/O unilateral nephrectomy  History of chronic kidney disease - Followed up by nephrologist  History of kidney stones  Vitamin D deficiency - Cannot take vitamin D due to CKD  Ileostomy in place, secondary to ulcerative colitis x 20 years  History of osteoporosis-she is not taking any treatment currently.   History of breast cancer - s/p lumpectomy and RT 03/2011  Hypercholesterolemia  Essential hypertension  History of anemia  OSA on CPAP  Other insomnia  Recurrent UTI    Orders: No orders of the defined types were placed in this encounter.  No orders of the defined types were placed in this encounter.   Face-to-face time spent with patient was 30 minutes.> 50% of time was spent in counseling and coordination of care.  Follow-Up Instructions: Return in about 3 months (around 01/17/2018) for Gout, Osteoarthritis.   Bo Merino, MD  Note - This record has been created using Editor, commissioning.  Chart creation errors have been sought, but may not always  have been located. Such creation errors do not reflect on  the standard of medical care.

## 2017-10-08 DIAGNOSIS — Z87442 Personal history of urinary calculi: Secondary | ICD-10-CM | POA: Insufficient documentation

## 2017-10-08 DIAGNOSIS — Z87448 Personal history of other diseases of urinary system: Secondary | ICD-10-CM | POA: Insufficient documentation

## 2017-10-17 ENCOUNTER — Ambulatory Visit (INDEPENDENT_AMBULATORY_CARE_PROVIDER_SITE_OTHER): Payer: Medicare Other | Admitting: Rheumatology

## 2017-10-17 ENCOUNTER — Encounter: Payer: Self-pay | Admitting: Rheumatology

## 2017-10-17 VITALS — BP 122/72 | HR 115 | Ht 64.0 in | Wt 182.0 lb

## 2017-10-17 DIAGNOSIS — E559 Vitamin D deficiency, unspecified: Secondary | ICD-10-CM

## 2017-10-17 DIAGNOSIS — E790 Hyperuricemia without signs of inflammatory arthritis and tophaceous disease: Secondary | ICD-10-CM | POA: Diagnosis not present

## 2017-10-17 DIAGNOSIS — M25512 Pain in left shoulder: Secondary | ICD-10-CM

## 2017-10-17 DIAGNOSIS — Z932 Ileostomy status: Secondary | ICD-10-CM

## 2017-10-17 DIAGNOSIS — S46011S Strain of muscle(s) and tendon(s) of the rotator cuff of right shoulder, sequela: Secondary | ICD-10-CM | POA: Diagnosis not present

## 2017-10-17 DIAGNOSIS — Z87442 Personal history of urinary calculi: Secondary | ICD-10-CM | POA: Diagnosis not present

## 2017-10-17 DIAGNOSIS — M19042 Primary osteoarthritis, left hand: Secondary | ICD-10-CM

## 2017-10-17 DIAGNOSIS — Z87448 Personal history of other diseases of urinary system: Secondary | ICD-10-CM | POA: Diagnosis not present

## 2017-10-17 DIAGNOSIS — M1A09X0 Idiopathic chronic gout, multiple sites, without tophus (tophi): Secondary | ICD-10-CM | POA: Diagnosis not present

## 2017-10-17 DIAGNOSIS — M7062 Trochanteric bursitis, left hip: Secondary | ICD-10-CM

## 2017-10-17 DIAGNOSIS — Z96653 Presence of artificial knee joint, bilateral: Secondary | ICD-10-CM | POA: Diagnosis not present

## 2017-10-17 DIAGNOSIS — M7061 Trochanteric bursitis, right hip: Secondary | ICD-10-CM | POA: Diagnosis not present

## 2017-10-17 DIAGNOSIS — Z905 Acquired absence of kidney: Secondary | ICD-10-CM

## 2017-10-17 DIAGNOSIS — M19041 Primary osteoarthritis, right hand: Secondary | ICD-10-CM

## 2017-10-17 NOTE — Patient Instructions (Signed)
Iliotibial Band Syndrome Rehab Ask your health care provider which exercises are safe for you. Do exercises exactly as told by your health care provider and adjust them as directed. It is normal to feel mild stretching, pulling, tightness, or discomfort as you do these exercises, but you should stop right away if you feel sudden pain or your pain gets worse.Do not begin these exercises until told by your health care provider. Stretching and range of motion exercises These exercises warm up your muscles and joints and improve the movement and flexibility of your hip and pelvis. Exercise A: Quadriceps, prone  1. Lie on your abdomen on a firm surface, such as a bed or padded floor. 2. Bend your left / right knee and hold your ankle. If you cannot reach your ankle or pant leg, loop a belt around your foot and grab the belt instead. 3. Gently pull your heel toward your buttocks. Your knee should not slide out to the side. You should feel a stretch in the front of your thigh and knee. 4. Hold this position for __________ seconds. Repeat __________ times. Complete this stretch __________ times a day. Exercise B: Iliotibial band  1. Lie on your side with your left / right leg in the top position. 2. Bend both of your knees and grab your left / right ankle. Stretch out your bottom arm to help you balance. 3. Slowly bring your top knee back so your thigh goes behind your trunk. 4. Slowly lower your top leg toward the floor until you feel a gentle stretch on the outside of your left / right hip and thigh. If you do not feel a stretch and your knee will not fall farther, place the heel of your other foot on top of your knee and pull your knee down toward the floor with your foot. 5. Hold this position for __________ seconds. Repeat __________ times. Complete this stretch __________ times a day. Strengthening exercises These exercises build strength and endurance in your hip and pelvis. Endurance is the  ability to use your muscles for a long time, even after they get tired. Exercise C: Straight leg raises ( hip abductors) 1. Lie on your side with your left / right leg in the top position. Lie so your head, shoulder, knee, and hip line up. You may bend your bottom knee to help you balance. 2. Roll your hips slightly forward so your hips are stacked directly over each other and your left / right knee is facing forward. 3. Tense the muscles in your outer thigh and lift your top leg 4-6 inches (10-15 cm). 4. Hold this position for __________ seconds. 5. Slowly return to the starting position. Let your muscles relax completely before doing another repetition. Repeat __________ times. Complete this exercise __________ times a day. Exercise D: Straight leg raises ( hip extensors) 1. Lie on your abdomen on your bed or a firm surface. You can put a pillow under your hips if that is more comfortable. 2. Bend your left / right knee so your foot is straight up in the air. 3. Squeeze your buttock muscles and lift your left / right thigh off the bed. Do not let your back arch. 4. Tense this muscle as hard as you can without increasing any knee pain. 5. Hold this position for __________ seconds. 6. Slowly lower your leg to the starting position and allow it to relax completely. Repeat __________ times. Complete this exercise __________ times a day. Exercise E: Hip  hike 1. Stand sideways on a bottom step. Stand on your left / right leg with your other foot unsupported next to the step. You can hold onto the railing or wall if needed for balance. 2. Keep your knees straight and your torso square. Then, lift your left / right hip up toward the ceiling. 3. Slowly let your left / right hip lower toward the floor, past the starting position. Your foot should get closer to the floor. Do not lean or bend your knees. Repeat __________ times. Complete this exercise __________ times a day. This information is not  intended to replace advice given to you by your health care provider. Make sure you discuss any questions you have with your health care provider. Document Released: 04/10/2005 Document Revised: 12/14/2015 Document Reviewed: 03/12/2015 [employer] Interactive Patient Education  2018 [employer]. Shoulder Exercises Ask your health care provider which exercises are safe for you. Do exercises exactly as told by your health care provider and adjust them as directed. It is normal to feel mild stretching, pulling, tightness, or discomfort as you do these exercises, but you should stop right away if you feel sudden pain or your pain gets worse.Do not begin these exercises until told by your health care provider. RANGE OF MOTION EXERCISES These exercises warm up your muscles and joints and improve the movement and flexibility of your shoulder. These exercises also help to relieve pain, numbness, and tingling. These exercises involve stretching your injured shoulder directly. Exercise A: Pendulum  1. Stand near a wall or a surface that you can hold onto for balance. 2. Bend at the waist and let your left / right arm hang straight down. Use your other arm to support you. Keep your back straight and do not lock your knees. 3. Relax your left / right arm and shoulder muscles, and move your hips and your trunk so your left / right arm swings freely. Your arm should swing because of the motion of your body, not because you are using your arm or shoulder muscles. 4. Keep moving your body so your arm swings in the following directions, as told by your health care provider: ? Side to side. ? Forward and backward. ? In clockwise and counterclockwise circles. 5. Continue each motion for __________ seconds, or for as long as told by your health care provider. 6. Slowly return to the starting position. Repeat __________ times. Complete this exercise __________ times a day. Exercise B:Flexion, Standing  1. Stand and  hold a broomstick, a cane, or a similar object. Place your hands a little more than shoulder-width apart on the object. Your left / right hand should be palm-up, and your other hand should be palm-down. 2. Keep your elbow straight and keep your shoulder muscles relaxed. Push the stick down with your healthy arm to raise your left / right arm in front of your body, and then over your head until you feel a stretch in your shoulder. ? Avoid shrugging your shoulder while you raise your arm. Keep your shoulder blade tucked down toward the middle of your back. 3. Hold for __________ seconds. 4. Slowly return to the starting position. Repeat __________ times. Complete this exercise __________ times a day. Exercise C: Abduction, Standing 1. Stand and hold a broomstick, a cane, or a similar object. Place your hands a little more than shoulder-width apart on the object. Your left / right hand should be palm-up, and your other hand should be palm-down. 2. While keeping your elbow  straight and your shoulder muscles relaxed, push the stick across your body toward your left / right side. Raise your left / right arm to the side of your body and then over your head until you feel a stretch in your shoulder. ? Do not raise your arm above shoulder height, unless your health care provider tells you to do that. ? Avoid shrugging your shoulder while you raise your arm. Keep your shoulder blade tucked down toward the middle of your back. 3. Hold for __________ seconds. 4. Slowly return to the starting position. Repeat __________ times. Complete this exercise __________ times a day. Exercise D:Internal Rotation  1. Place your left / right hand behind your back, palm-up. 2. Use your other hand to dangle an exercise band, a towel, or a similar object over your shoulder. Grasp the band with your left / right hand so you are holding onto both ends. 3. Gently pull up on the band until you feel a stretch in the front of your  left / right shoulder. ? Avoid shrugging your shoulder while you raise your arm. Keep your shoulder blade tucked down toward the middle of your back. 4. Hold for __________ seconds. 5. Release the stretch by letting go of the band and lowering your hands. Repeat __________ times. Complete this exercise __________ times a day. STRETCHING EXERCISES These exercises warm up your muscles and joints and improve the movement and flexibility of your shoulder. These exercises also help to relieve pain, numbness, and tingling. These exercises are done using your healthy shoulder to help stretch the muscles of your injured shoulder. Exercise E: Warehouse manager (External Rotation and Abduction)  1. Stand in a doorway with one of your feet slightly in front of the other. This is called a staggered stance. If you cannot reach your forearms to the door frame, stand facing a corner of a room. 2. Choose one of the following positions as told by your health care provider: ? Place your hands and forearms on the door frame above your head. ? Place your hands and forearms on the door frame at the height of your head. ? Place your hands on the door frame at the height of your elbows. 3. Slowly move your weight onto your front foot until you feel a stretch across your chest and in the front of your shoulders. Keep your head and chest upright and keep your abdominal muscles tight. 4. Hold for __________ seconds. 5. To release the stretch, shift your weight to your back foot. Repeat __________ times. Complete this stretch __________ times a day. Exercise F:Extension, Standing 1. Stand and hold a broomstick, a cane, or a similar object behind your back. ? Your hands should be a little wider than shoulder-width apart. ? Your palms should face away from your back. 2. Keeping your elbows straight and keeping your shoulder muscles relaxed, move the stick away from your body until you feel a stretch in your  shoulder. ? Avoid shrugging your shoulders while you move the stick. Keep your shoulder blade tucked down toward the middle of your back. 3. Hold for __________ seconds. 4. Slowly return to the starting position. Repeat __________ times. Complete this exercise __________ times a day. STRENGTHENING EXERCISES These exercises build strength and endurance in your shoulder. Endurance is the ability to use your muscles for a long time, even after they get tired. Exercise G:External Rotation  1. Sit in a stable chair without armrests. 2. Secure an exercise band at elbow height  on your left / right side. 3. Place a soft object, such as a folded towel or a small pillow, between your left / right upper arm and your body to move your elbow a few inches away (about 10 cm) from your side. 4. Hold the end of the band so it is tight and there is no slack. 5. Keeping your elbow pressed against the soft object, move your left / right forearm out, away from your abdomen. Keep your body steady so only your forearm moves. 6. Hold for __________ seconds. 7. Slowly return to the starting position. Repeat __________ times. Complete this exercise __________ times a day. Exercise H:Shoulder Abduction  1. Sit in a stable chair without armrests, or stand. 2. Hold a __________ weight in your left / right hand, or hold an exercise band with both hands. 3. Start with your arms straight down and your left / right palm facing in, toward your body. 4. Slowly lift your left / right hand out to your side. Do not lift your hand above shoulder height unless your health care provider tells you that this is safe. ? Keep your arms straight. ? Avoid shrugging your shoulder while you do this movement. Keep your shoulder blade tucked down toward the middle of your back. 5. Hold for __________ seconds. 6. Slowly lower your arm, and return to the starting position. Repeat __________ times. Complete this exercise __________ times a  day. Exercise I:Shoulder Extension 1. Sit in a stable chair without armrests, or stand. 2. Secure an exercise band to a stable object in front of you where it is at shoulder height. 3. Hold one end of the exercise band in each hand. Your palms should face each other. 4. Straighten your elbows and lift your hands up to shoulder height. 5. Step back, away from the secured end of the exercise band, until the band is tight and there is no slack. 6. Squeeze your shoulder blades together as you pull your hands down to the sides of your thighs. Stop when your hands are straight down by your sides. Do not let your hands go behind your body. 7. Hold for __________ seconds. 8. Slowly return to the starting position. Repeat __________ times. Complete this exercise __________ times a day. Exercise J:Standing Shoulder Row 1. Sit in a stable chair without armrests, or stand. 2. Secure an exercise band to a stable object in front of you so it is at waist height. 3. Hold one end of the exercise band in each hand. Your palms should be in a thumbs-up position. 4. Bend each of your elbows to an "L" shape (about 90 degrees) and keep your upper arms at your sides. 5. Step back until the band is tight and there is no slack. 6. Slowly pull your elbows back behind you. 7. Hold for __________ seconds. 8. Slowly return to the starting position. Repeat __________ times. Complete this exercise __________ times a day. Exercise K:Shoulder Press-Ups  1. Sit in a stable chair that has armrests. Sit upright, with your feet flat on the floor. 2. Put your hands on the armrests so your elbows are bent and your fingers are pointing forward. Your hands should be about even with the sides of your body. 3. Push down on the armrests and use your arms to lift yourself off of the chair. Straighten your elbows and lift yourself up as much as you comfortably can. ? Move your shoulder blades down, and avoid letting your shoulders  move  up toward your ears. ? Keep your feet on the ground. As you get stronger, your feet should support less of your body weight as you lift yourself up. 4. Hold for __________ seconds. 5. Slowly lower yourself back into the chair. Repeat __________ times. Complete this exercise __________ times a day. Exercise L: Wall Push-Ups  1. Stand so you are facing a stable wall. Your feet should be about one arm-length away from the wall. 2. Lean forward and place your palms on the wall at shoulder height. 3. Keep your feet flat on the floor as you bend your elbows and lean forward toward the wall. 4. Hold for __________ seconds. 5. Straighten your elbows to push yourself back to the starting position. Repeat __________ times. Complete this exercise __________ times a day. This information is not intended to replace advice given to you by your health care provider. Make sure you discuss any questions you have with your health care provider. Document Released: 02/22/2005 Document Revised: 01/03/2016 Document Reviewed: 12/20/2014 [employer] Interactive Patient Education  2018 [employer].

## 2017-12-30 ENCOUNTER — Inpatient Hospital Stay (HOSPITAL_COMMUNITY): Payer: Medicare Other

## 2017-12-30 ENCOUNTER — Other Ambulatory Visit: Payer: Self-pay

## 2017-12-30 ENCOUNTER — Encounter (HOSPITAL_COMMUNITY): Payer: Self-pay | Admitting: Emergency Medicine

## 2017-12-30 ENCOUNTER — Emergency Department (HOSPITAL_COMMUNITY): Payer: Medicare Other

## 2017-12-30 ENCOUNTER — Inpatient Hospital Stay (HOSPITAL_COMMUNITY)
Admission: EM | Admit: 2017-12-30 | Discharge: 2018-01-02 | DRG: 389 | Disposition: A | Discharge status: Home / Self Care | Payer: Medicare Other | Attending: General Surgery | Admitting: General Surgery

## 2017-12-30 DIAGNOSIS — Z90710 Acquired absence of both cervix and uterus: Secondary | ICD-10-CM | POA: Diagnosis not present

## 2017-12-30 DIAGNOSIS — K56609 Unspecified intestinal obstruction, unspecified as to partial versus complete obstruction: Secondary | ICD-10-CM | POA: Diagnosis present

## 2017-12-30 DIAGNOSIS — M19041 Primary osteoarthritis, right hand: Secondary | ICD-10-CM | POA: Diagnosis present

## 2017-12-30 DIAGNOSIS — Z87898 Personal history of other specified conditions: Secondary | ICD-10-CM | POA: Diagnosis not present

## 2017-12-30 DIAGNOSIS — Z86711 Personal history of pulmonary embolism: Secondary | ICD-10-CM | POA: Diagnosis not present

## 2017-12-30 DIAGNOSIS — Z23 Encounter for immunization: Secondary | ICD-10-CM | POA: Diagnosis present

## 2017-12-30 DIAGNOSIS — M19042 Primary osteoarthritis, left hand: Secondary | ICD-10-CM | POA: Diagnosis present

## 2017-12-30 DIAGNOSIS — Z87442 Personal history of urinary calculi: Secondary | ICD-10-CM | POA: Diagnosis not present

## 2017-12-30 DIAGNOSIS — Z96653 Presence of artificial knee joint, bilateral: Secondary | ICD-10-CM | POA: Diagnosis present

## 2017-12-30 DIAGNOSIS — Z932 Ileostomy status: Secondary | ICD-10-CM

## 2017-12-30 DIAGNOSIS — E7800 Pure hypercholesterolemia, unspecified: Secondary | ICD-10-CM | POA: Diagnosis present

## 2017-12-30 DIAGNOSIS — Z923 Personal history of irradiation: Secondary | ICD-10-CM | POA: Diagnosis not present

## 2017-12-30 DIAGNOSIS — M810 Age-related osteoporosis without current pathological fracture: Secondary | ICD-10-CM | POA: Diagnosis present

## 2017-12-30 DIAGNOSIS — Z880 Allergy status to penicillin: Secondary | ICD-10-CM

## 2017-12-30 DIAGNOSIS — Z7982 Long term (current) use of aspirin: Secondary | ICD-10-CM

## 2017-12-30 DIAGNOSIS — Z8249 Family history of ischemic heart disease and other diseases of the circulatory system: Secondary | ICD-10-CM

## 2017-12-30 DIAGNOSIS — I10 Essential (primary) hypertension: Secondary | ICD-10-CM | POA: Diagnosis present

## 2017-12-30 DIAGNOSIS — Z853 Personal history of malignant neoplasm of breast: Secondary | ICD-10-CM

## 2017-12-30 DIAGNOSIS — Z888 Allergy status to other drugs, medicaments and biological substances status: Secondary | ICD-10-CM

## 2017-12-30 DIAGNOSIS — Z9842 Cataract extraction status, left eye: Secondary | ICD-10-CM | POA: Diagnosis not present

## 2017-12-30 DIAGNOSIS — Z79899 Other long term (current) drug therapy: Secondary | ICD-10-CM

## 2017-12-30 DIAGNOSIS — G4733 Obstructive sleep apnea (adult) (pediatric): Secondary | ICD-10-CM | POA: Diagnosis present

## 2017-12-30 DIAGNOSIS — Z882 Allergy status to sulfonamides status: Secondary | ICD-10-CM

## 2017-12-30 DIAGNOSIS — Z87440 Personal history of urinary (tract) infections: Secondary | ICD-10-CM

## 2017-12-30 DIAGNOSIS — Z905 Acquired absence of kidney: Secondary | ICD-10-CM

## 2017-12-30 DIAGNOSIS — Z95828 Presence of other vascular implants and grafts: Secondary | ICD-10-CM | POA: Diagnosis not present

## 2017-12-30 DIAGNOSIS — K5190 Ulcerative colitis, unspecified, without complications: Secondary | ICD-10-CM | POA: Diagnosis present

## 2017-12-30 DIAGNOSIS — G4709 Other insomnia: Secondary | ICD-10-CM | POA: Diagnosis present

## 2017-12-30 DIAGNOSIS — Z800 Family history of malignant neoplasm of digestive organs: Secondary | ICD-10-CM

## 2017-12-30 DIAGNOSIS — Z0189 Encounter for other specified special examinations: Secondary | ICD-10-CM

## 2017-12-30 DIAGNOSIS — Z9049 Acquired absence of other specified parts of digestive tract: Secondary | ICD-10-CM

## 2017-12-30 DIAGNOSIS — Z8672 Personal history of thrombophlebitis: Secondary | ICD-10-CM

## 2017-12-30 DIAGNOSIS — Z803 Family history of malignant neoplasm of breast: Secondary | ICD-10-CM

## 2017-12-30 LAB — I-STAT CHEM 8, ED
BUN: 38 mg/dL — AB (ref 8–23)
CHLORIDE: 104 mmol/L (ref 98–111)
CREATININE: 1.4 mg/dL — AB (ref 0.44–1.00)
Calcium, Ion: 1.24 mmol/L (ref 1.15–1.40)
GLUCOSE: 140 mg/dL — AB (ref 70–99)
HCT: 50 % — ABNORMAL HIGH (ref 36.0–46.0)
Hemoglobin: 17 g/dL — ABNORMAL HIGH (ref 12.0–15.0)
POTASSIUM: 4 mmol/L (ref 3.5–5.1)
Sodium: 139 mmol/L (ref 135–145)
TCO2: 24 mmol/L (ref 22–32)

## 2017-12-30 LAB — CBC WITH DIFFERENTIAL/PLATELET
Basophils Absolute: 0 10*3/uL (ref 0.0–0.1)
Basophils Relative: 0 %
Eosinophils Absolute: 0.1 10*3/uL (ref 0.0–0.7)
Eosinophils Relative: 1 %
HCT: 47.3 % — ABNORMAL HIGH (ref 36.0–46.0)
HEMOGLOBIN: 15.8 g/dL — AB (ref 12.0–15.0)
LYMPHS ABS: 0.7 10*3/uL (ref 0.7–4.0)
LYMPHS PCT: 5 %
MCH: 29.3 pg (ref 26.0–34.0)
MCHC: 33.4 g/dL (ref 30.0–36.0)
MCV: 87.6 fL (ref 78.0–100.0)
Monocytes Absolute: 1 10*3/uL (ref 0.1–1.0)
Monocytes Relative: 7 %
NEUTROS PCT: 87 %
Neutro Abs: 11.7 10*3/uL — ABNORMAL HIGH (ref 1.7–7.7)
Platelets: 331 10*3/uL (ref 150–400)
RBC: 5.4 MIL/uL — ABNORMAL HIGH (ref 3.87–5.11)
RDW: 14.2 % (ref 11.5–15.5)
WBC: 13.5 10*3/uL — ABNORMAL HIGH (ref 4.0–10.5)

## 2017-12-30 LAB — COMPREHENSIVE METABOLIC PANEL
ALK PHOS: 149 U/L — AB (ref 38–126)
ALT: 27 U/L (ref 0–44)
AST: 34 U/L (ref 15–41)
Albumin: 4.7 g/dL (ref 3.5–5.0)
Anion gap: 18 — ABNORMAL HIGH (ref 5–15)
BILIRUBIN TOTAL: 1.5 mg/dL — AB (ref 0.3–1.2)
BUN: 37 mg/dL — AB (ref 8–23)
CHLORIDE: 100 mmol/L (ref 98–111)
CO2: 22 mmol/L (ref 22–32)
Calcium: 10.7 mg/dL — ABNORMAL HIGH (ref 8.9–10.3)
Creatinine, Ser: 1.41 mg/dL — ABNORMAL HIGH (ref 0.44–1.00)
GFR, EST AFRICAN AMERICAN: 43 mL/min — AB (ref >60)
GFR, EST NON AFRICAN AMERICAN: 37 mL/min — AB (ref >60)
Glucose, Bld: 145 mg/dL — ABNORMAL HIGH (ref 70–99)
Potassium: 4 mmol/L (ref 3.5–5.1)
Sodium: 140 mmol/L (ref 135–145)
Total Protein: 8.6 g/dL — ABNORMAL HIGH (ref 6.5–8.1)

## 2017-12-30 LAB — I-STAT TROPONIN, ED: TROPONIN I, POC: 0.02 ng/mL (ref 0.00–0.08)

## 2017-12-30 LAB — LIPASE, BLOOD: Lipase: 51 U/L (ref 11–51)

## 2017-12-30 MED ORDER — ACETAMINOPHEN 650 MG RE SUPP: 650.0000 mg | Freq: Four times a day (QID) | RECTAL | Status: DC | PRN

## 2017-12-30 MED ORDER — ONDANSETRON 4 MG PO TBDP: 4.0000 mg | ORAL_TABLET | Freq: Four times a day (QID) | ORAL | Status: DC | PRN

## 2017-12-30 MED ORDER — LIDOCAINE HCL URETHRAL/MUCOSAL 2 % EX GEL
1.0000 "application " | Freq: Once | CUTANEOUS | Status: AC
  Administered 2017-12-30: 1 via TOPICAL
  Filled 2017-12-30: qty 5

## 2017-12-30 MED ORDER — FENTANYL CITRATE (PF) 100 MCG/2ML IJ SOLN
50.0000 ug | Freq: Once | INTRAMUSCULAR | Status: AC
  Administered 2017-12-30: 50 ug via INTRAVENOUS
  Filled 2017-12-30: qty 2

## 2017-12-30 MED ORDER — ACETAMINOPHEN 325 MG PO TABS: 650.0000 mg | ORAL_TABLET | Freq: Four times a day (QID) | ORAL | Status: DC | PRN

## 2017-12-30 MED ORDER — KCL IN DEXTROSE-NACL 20-5-0.45 MEQ/L-%-% IV SOLN
INTRAVENOUS | Status: DC
  Administered 2017-12-30 – 2018-01-02 (×4): via INTRAVENOUS
  Filled 2017-12-30 (×6): qty 1000

## 2017-12-30 MED ORDER — MORPHINE SULFATE (PF) 2 MG/ML IV SOLN
2.0000 mg | INTRAVENOUS | Status: DC | PRN
  Administered 2017-12-30 – 2017-12-31 (×2): 2 mg via INTRAVENOUS
  Filled 2017-12-30 (×2): qty 1

## 2017-12-30 MED ORDER — DIPHENHYDRAMINE HCL 50 MG/ML IJ SOLN: 12.5000 mg | Freq: Four times a day (QID) | INTRAMUSCULAR | Status: DC | PRN

## 2017-12-30 MED ORDER — DIATRIZOATE MEGLUMINE & SODIUM 66-10 % PO SOLN
90.0000 mL | Freq: Once | ORAL | Status: AC
  Administered 2017-12-30: 90 mL via NASOGASTRIC
  Filled 2017-12-30 (×2): qty 90

## 2017-12-30 MED ORDER — ENOXAPARIN SODIUM 40 MG/0.4ML ~~LOC~~ SOLN
40.0000 mg | SUBCUTANEOUS | Status: DC
  Administered 2017-12-30 – 2017-12-31 (×2): 40 mg via SUBCUTANEOUS
  Filled 2017-12-30 (×2): qty 0.4

## 2017-12-30 MED ORDER — OXYCODONE HCL 5 MG PO TABS: 5.0000 mg | ORAL_TABLET | ORAL | Status: DC | PRN

## 2017-12-30 MED ORDER — ONDANSETRON HCL 4 MG/2ML IJ SOLN
4.0000 mg | Freq: Once | INTRAMUSCULAR | Status: AC
  Administered 2017-12-30: 4 mg via INTRAVENOUS
  Filled 2017-12-30: qty 2

## 2017-12-30 MED ORDER — SODIUM CHLORIDE 0.9 % IV BOLUS
1000.0000 mL | Freq: Once | INTRAVENOUS | Status: AC
  Administered 2017-12-30: 1000 mL via INTRAVENOUS

## 2017-12-30 MED ORDER — ONDANSETRON HCL 4 MG/2ML IJ SOLN
4.0000 mg | Freq: Four times a day (QID) | INTRAMUSCULAR | Status: DC | PRN
  Administered 2017-12-30 – 2017-12-31 (×2): 4 mg via INTRAVENOUS
  Filled 2017-12-30 (×2): qty 2

## 2017-12-30 MED ORDER — METOPROLOL TARTRATE 5 MG/5ML IV SOLN: 5.0000 mg | Freq: Four times a day (QID) | INTRAVENOUS | Status: DC | PRN

## 2017-12-30 MED ORDER — DIPHENHYDRAMINE HCL 12.5 MG/5ML PO ELIX: 12.5000 mg | ORAL_SOLUTION | Freq: Four times a day (QID) | ORAL | Status: DC | PRN

## 2017-12-30 NOTE — ED Notes (Signed)
ED TO INPATIENT HANDOFF REPORT  Name/Age/Gender Deanna Burke 67 y.o. female  Code Status    Code Status Orders  (From admission, onward)         Start     Ordered   12/30/17 1539  Full code  Continuous     12/30/17 1538        Code Status History    Date Active Date Inactive Code Status Order ID Comments User Context   11/04/2015 1118 11/07/2015 1800 Full Code [id number]  Susa Day, MD Inpatient   04/01/2015 1132 04/05/2015 1842 Full Code [id number]  Susa Day, MD Inpatient   12/30/2013 1749 01/05/2014 1346 Full Code [id number]  Excell Seltzer, MD Inpatient   06/05/2012 1933 06/15/2012 1357 Full Code [id number]  Janece Canterbury, MD ED   04/17/2012 1323 04/23/2012 1845 Full Code [id number]  Scronce, Marykay Lex, RN ED   08/03/2011 2054 08/13/2011 1657 Full Code [id number]  Gerkin, Merlinda Frederick, MD ED      Home/SNF/Other Home  Chief Complaint vomiting   Level of Care/Admitting Diagnosis ED Disposition    ED Disposition Condition [place]: [facility] [[id number]]  Level of Care: Med-Surg [16]  Diagnosis: Small bowel obstruction [facility] [[id number]]  Admitting Physician: [place], [place]  Attending Physician: CCS, MD [[id number]]  Estimated length of stay: past midnight tomorrow  Certification:: I certify this patient will need inpatient services for at least 2 midnights  PT Class (Do Not Modify): Inpatient [101]  PT Acc Code (Do Not Modify): Private [1]       Medical History Past Medical History:  Diagnosis Date  . Abnormal finding on cardiovascular stress test, ischemia anterolateral 02/13/2012   follow heart cath - pt told one tiny blockage that didn't even matter  . Arthritis    hands, lumbar spine, hips, right ankle  . Breast cancer ([place])   . 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 Cataract    Left Eye  . Complication of anesthesia 1996   pt has ileostomy and had surgery in 1996 that paralyzed  . Fatigue   . H/O nephrostomy 08/13/12  .  H/O ulcerative colitis   . History of bone density study 2012  . History of recurrent UTIs 02/13/2012  . Hot flashes   . Hypertension   . Ileostomy in place, secondary to ulcerative colitis x 20 years 02/13/2012  . 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 Large bowel perforation ([place]) 1971/1996  . Phlebitis 1971  . Pulmonary embolus ([place]) 1971  . S/P ileostomy ([place])   . S/P radiation therapy 07/24/11 - 09/06/11   Left Breast/ 5000 cGy in 25 Fractions with Boost of 1000 cGy in 5 Fractions  . Scoliosis   . Sleep apnea 2009   uses cpap-setting is 1  . Small bowel obstruction ([place]) [id number]  . Small bowel perforation ([employer]) 1971/1996  . Syncope, ? anginal equivilant 02/13/2012  . Torn rotator cuff 2005  . Ulcerative colitis [phone]    Allergies Allergies  Allergen Reactions  . Bactrim [Sulfamethoxazole-Trimethoprim] Nausea And Vomiting  . Demerol Nausea And Vomiting  . Dilaudid [Hydromorphone Hcl] Nausea And Vomiting and Other (See Comments)    Muscle cramping  . Reglan [Metoclopramide] Other (See Comments)    Cramps in hands  . Stadol [Butorphanol Tartrate] Nausea And Vomiting    Injection  . Cephalexin Rash  . Penicillins Rash    Rash only;  note also rash to Keflex Has patient had a PCN reaction causing immediate rash, facial/tongue/throat swelling, SOB or lightheadedness with hypotension: rash Has patient had a PCN reaction causing severe rash involving mucus membranes or skin necrosis: no Has patient had a PCN reaction that required hospitalization no Has patient had a PCN reaction occurring within the last 10 years: no If all of the above answers are "NO", then may proceed with Cephalosporin use.    IV Location/Drains/Wounds Patient Lines/Drains/Airways Status   Active Line/Drains/Airways    Name:   Placement date:   Placement time:   Site:   Days:   Peripheral IV 12/30/17 Left Antecubital   12/30/17    0937    Antecubital   less than  1   Nephrostomy Right 10 Fr.   06/13/12    1353    Right   2026   NG/OG Tube Nasogastric 16 Fr. Right nare Xray Documented cm marking at nare/ corner of mouth   12/30/17    1158    Right nare   less than 1   Ileostomy Standard (end) RLQ   08/17/12    -    RLQ   1961   Airway   11/04/15    0725     787   Incision (Closed) 04/01/15 Knee Left   04/01/15    0943     1004   Incision (Closed) 11/04/15 Knee Right   11/04/15    0852     787   Incision - 1 Port Other (Comment) 1: Right   03/31/15    -     1005          Labs/Imaging Results for orders placed or performed during the hospital encounter of 12/30/17 (from the past 48 hour(s))  Comprehensive metabolic panel     Status: Abnormal   Collection Time: 12/30/17  9:40 AM  Result Value Ref Range   Sodium 140 135 - 145 mmol/L   Potassium 4.0 3.5 - 5.1 mmol/L   Chloride 100 98 - 111 mmol/L   CO2 22 22 - 32 mmol/L   Glucose, Bld 145 (H) 70 - 99 mg/dL   BUN 37 (H) 8 - 23 mg/dL   Creatinine, Ser 1.41 (H) 0.44 - 1.00 mg/dL   Calcium 10.7 (H) 8.9 - 10.3 mg/dL   Total Protein 8.6 (H) 6.5 - 8.1 g/dL   Albumin 4.7 3.5 - 5.0 g/dL   AST 34 15 - 41 U/L   ALT 27 0 - 44 U/L   Alkaline Phosphatase 149 (H) 38 - 126 U/L   Total Bilirubin 1.5 (H) 0.3 - 1.2 mg/dL   GFR calc non Af Amer 37 (L) >60 mL/min   GFR calc Af Amer 43 (L) >60 mL/min    Comment: (NOTE) The eGFR has been calculated using the CKD EPI equation. This calculation has not been validated in all clinical situations. eGFR's persistently <60 mL/min signify possible Chronic Kidney Disease.    Anion gap 18 (H) 5 - 15    Comment: Performed at [facility], [place] [address], [place], [place] [address]  CBC with Differential     Status: Abnormal   Collection Time: 12/30/17  9:40 AM  Result Value Ref Range   WBC 13.5 (H) 4.0 - 10.5 K/uL   RBC 5.40 (H) 3.87 - 5.11 MIL/uL   Hemoglobin 15.8 (H) 12.0 - 15.0 g/dL   HCT 47.3 (H) 36.0 - 46.0 %   MCV 87.6 78.0 -  100.0 fL   MCH  29.3 26.0 - 34.0 pg   MCHC 33.4 30.0 - 36.0 g/dL   RDW 14.2 11.5 - 15.5 %   Platelets 331 150 - 400 K/uL   Neutrophils Relative % 87 %   Neutro Abs 11.7 (H) 1.7 - 7.7 K/uL   Lymphocytes Relative 5 %   Lymphs Abs 0.7 0.7 - 4.0 K/uL   Monocytes Relative 7 %   Monocytes Absolute 1.0 0.1 - 1.0 K/uL   Eosinophils Relative 1 %   Eosinophils Absolute 0.1 0.0 - 0.7 K/uL   Basophils Relative 0 %   Basophils Absolute 0.0 0.0 - 0.1 K/uL    Comment: Performed at [facility], [place] [address] [place], [place] [address]  Lipase, blood     Status: None   Collection Time: 12/30/17  9:40 AM  Result Value Ref Range   Lipase 51 11 - 51 U/L    Comment: Performed at [facility], [place] [address] [place], [place] [address]  I-stat troponin, ED     Status: None   Collection Time: 12/30/17  9:45 AM  Result Value Ref Range   Troponin i, poc 0.02 0.00 - 0.08 ng/mL   Comment 3            Comment: Due to the release kinetics of cTnI, a negative result within the first hours of the onset of symptoms does not rule out myocardial infarction with certainty. If myocardial infarction is still suspected, repeat the test at appropriate intervals.   I-stat Chem 8, ED     Status: Abnormal   Collection Time: 12/30/17  9:47 AM  Result Value Ref Range   Sodium 139 135 - 145 mmol/L   Potassium 4.0 3.5 - 5.1 mmol/L   Chloride 104 98 - 111 mmol/L   BUN 38 (H) 8 - 23 mg/dL   Creatinine, Ser 1.40 (H) 0.44 - 1.00 mg/dL   Glucose, Bld 140 (H) 70 - 99 mg/dL   Calcium, Ion 1.24 1.15 - 1.40 mmol/L   TCO2 24 22 - 32 mmol/L   Hemoglobin 17.0 (H) 12.0 - 15.0 g/dL   HCT 50.0 (H) 36.0 - 46.0 %   Ct Abdomen Pelvis Wo Contrast  Result Date: 12/30/2017 CLINICAL DATA:  Nausea, vomiting, and abdominal pain. Patient states she has a block ileostomy. EXAM: CT ABDOMEN AND PELVIS WITHOUT CONTRAST TECHNIQUE: Multidetector CT imaging of the abdomen and pelvis was performed following the standard  protocol without IV contrast. COMPARISON:  CT scan June 30, 2016 FINDINGS: Lower chest: Left lumpectomy changes. Small hiatal hernia. No nodules or masses in the lung bases. No suspicious infiltrates. Scattered atelectasis. Hepatobiliary: Hepatic steatosis.  The gallbladder is unremarkable. Pancreas: Unremarkable. No pancreatic ductal dilatation or surrounding inflammatory changes. Spleen: Normal in size without focal abnormality. Adrenals/Urinary Tract: The left adrenal gland is normal. Multiple tiny stones are seen in the left kidney without hydronephrosis or perinephric stranding. The left ureter is normal. The bladder is unremarkable. The patient is status post right nephrectomy. The right adrenal gland is unremarkable. Stomach/Bowel: There is a small hiatal hernia. The stomach is unremarkable. The small bowel is diffusely distended along much of its length. The most distal small bowel is decompressed. The transition point is in the pelvis. While the transition point is not discretely seen, I suspect it is in the anterior mid to lower pelvis. Patient is status post colectomy. The rectal stump is unremarkable. Vascular/Lymphatic: Mild atherosclerosis in the tortuous but nonaneurysmal  aorta. No adenopathy. An IVC filter is identified. Reproductive: Patient is status post hysterectomy. No adnexal masses are noted. Other: No free air or free fluid. Musculoskeletal: No acute or significant osseous findings. IMPRESSION: 1. Small bowel obstruction. The majority of the small bowel is distended. The transition point is in the pelvis, likely the anterior mid to lower pelvis. The most distal small bowel is decompressed. There is a right lower quadrant ileostomy. 2. Multiple tiny nonobstructive stones in the left kidney. 3. Previous right nephrectomy. 4. Colectomy. 5. Atherosclerosis in the nonaneurysmal aorta. Electronically Signed   By: Dorise Bullion III M.D   On: 12/30/2017 10:42   Dg Abdomen 1 View  Result Date:  12/30/2017 CLINICAL DATA:  67 year old female with a history of nasogastric tube placement EXAM: ABDOMEN - 1 VIEW COMPARISON:  CT 12/30/2017 FINDINGS: Plain-film upper abdomen demonstrates gastric tube terminating in the stomach. IVC filter in place. Gas within stomach and small bowel, as well as colon. IMPRESSION: Limited plain film demonstrates gastric tube terminating in the stomach. Electronically Signed   By: Corrie Mckusick D.O.   On: 12/30/2017 12:43    Pending Labs Unresulted Labs (From admission, onward)    Start     Ordered   01/06/18 0500  Creatinine, serum  (enoxaparin (LOVENOX)    CrCl >/= 30 ml/min)  Weekly,   R    Comments:  while on enoxaparin therapy    12/30/17 1538   12/31/17 0500  Comprehensive metabolic panel  Daily,   R     12/30/17 1538   12/31/17 0500  CBC  Daily,   R     12/30/17 1538   12/30/17 1539  CBC  (enoxaparin (LOVENOX)    CrCl >/= 30 ml/min)  Once,   R    Comments:  Baseline for enoxaparin therapy IF NOT ALREADY DRAWN.  Notify MD if PLT < 100 K.    12/30/17 1538   12/30/17 1539  Creatinine, serum  (enoxaparin (LOVENOX)    CrCl >/= 30 ml/min)  Once,   R    Comments:  Baseline for enoxaparin therapy IF NOT ALREADY DRAWN.    12/30/17 1538   12/30/17 1539  HIV antibody (Routine Testing)  Once,   R     12/30/17 1538   12/30/17 0833  Urinalysis, Routine w reflex microscopic  STAT,   STAT     12/30/17 0833          Vitals/Pain Today's Vitals   12/30/17 1200 12/30/17 1405 12/30/17 1500 12/30/17 1546  BP: 126/77 123/63  138/74  Pulse: 92 77  95  Resp: 15 (!) 24  19  Temp:      TempSrc:      SpO2: 97% 98%  95%  Weight:      Height:      PainSc:   4      Isolation Precautions No active isolations  Medications Medications  enoxaparin (LOVENOX) injection 40 mg (has no administration in time range)  dextrose 5 % and 0.45 % NaCl with KCl 20 mEq/L infusion (has no administration in time range)  acetaminophen (TYLENOL) tablet 650 mg (has no  administration in time range)    Or  acetaminophen (TYLENOL) suppository 650 mg (has no administration in time range)  oxyCODONE (Oxy IR/ROXICODONE) immediate release tablet 5 mg (has no administration in time range)  morphine 2 MG/ML injection 2 mg (has no administration in time range)  ondansetron (ZOFRAN-ODT) disintegrating tablet 4 mg (has no administration in time  range)    Or  ondansetron (ZOFRAN) injection 4 mg (has no administration in time range)  metoprolol tartrate (LOPRESSOR) injection 5 mg (has no administration in time range)  diatrizoate meglumine-sodium (GASTROGRAFIN) 66-10 % solution 90 mL (has no administration in time range)  diphenhydrAMINE (BENADRYL) 12.5 MG/5ML elixir 12.5 mg (has no administration in time range)    Or  diphenhydrAMINE (BENADRYL) injection 12.5 mg (has no administration in time range)  sodium chloride 0.9 % bolus 1,000 mL (0 mLs Intravenous Stopped 12/30/17 1128)  ondansetron (ZOFRAN) injection 4 mg (4 mg Intravenous Given 12/30/17 0941)  fentaNYL (SUBLIMAZE) injection 50 mcg (50 mcg Intravenous Given 12/30/17 0941)  lidocaine (XYLOCAINE) 2 % jelly 1 application (1 application Topical Given 12/30/17 1139)    Mobility walks

## 2017-12-30 NOTE — ED Triage Notes (Signed)
Pt with nausea, vomiting and abdominal pain. Pt states she has a blocked ileostomy, has had this many times.

## 2017-12-30 NOTE — ED Provider Notes (Signed)
[facility] DEPT Provider Note   CSN: [id number] Arrival date & time: 12/30/17  0810     History   Chief Complaint Chief Complaint  Patient presents with  . Abdominal Pain  . Emesis    HPI LOULA MARCELLA is a 67 y.o. female.  The history is provided by the patient. No language interpreter was used.   DARRIEN LAAKSO is a 67 y.o. female who presents to the Emergency Department complaining of abdominal pain, vomiting.  She presents to the ED complaining of abdominal pain and vomiting that began after eating dinner last night.  She has had three episodes of emesis.  Denies fevers. She is experiencing decreased output from her ileostomy. Experienced similar symptoms in the past related to bowel obstruction. Denies dysuria, fevers. Symptoms are severe and constant in nature. Past Medical History:  Diagnosis Date  . Abnormal finding on cardiovascular stress test, ischemia anterolateral 02/13/2012   follow heart cath - pt told one tiny blockage that didn't even matter  . Arthritis    hands, lumbar spine, hips, right ankle  . Breast cancer ([place])   . 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 Cataract    Left Eye  . Complication of anesthesia 1996   pt has ileostomy and had surgery in 1996 that paralyzed  . Fatigue   . H/O nephrostomy 08/13/12  . H/O ulcerative colitis   . History of bone density study 2012  . History of recurrent UTIs 02/13/2012  . Hot flashes   . Hypertension   . Ileostomy in place, secondary to ulcerative colitis x 20 years 02/13/2012  . 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 Large bowel perforation ([place]) 1971/1996  . Phlebitis 1971  . Pulmonary embolus ([place]) 1971  . S/P ileostomy ([place])   . S/P radiation therapy 07/24/11 - 09/06/11   Left Breast/ 5000 cGy in 25 Fractions with Boost of 1000 cGy in 5 Fractions  . Scoliosis   . Sleep apnea 2009   uses cpap-setting is 1  . Small bowel obstruction  ([place]) [id number]  . Small bowel perforation ([place]) 1971/1996  . Syncope, ? anginal equivilant 02/13/2012  . Torn rotator cuff 2005  . Ulcerative colitis [id number]    Patient Active Problem List   Diagnosis Date Noted  . Small bowel obstruction ([place]) 12/30/2017  . History of chronic kidney disease 10/08/2017  . History of kidney stones 10/08/2017  . Traumatic complete tear of right rotator cuff 09/19/2017  . History of osteoporosis 09/19/2017  . Vitamin D deficiency 09/19/2017  . Hypercholesterolemia 09/19/2017  . OSA on CPAP 09/19/2017  . Other insomnia 09/19/2017  . Recurrent UTI 09/19/2017  . Primary osteoarthritis of both hands 09/19/2017  . S/P TKR (total knee replacement), bilateral 09/19/2017  . Ulcerative colitis ([place]) 05/10/2015  . Ileostomy present ([place]) 05/10/2015  . H/O unilateral nephrectomy 05/10/2015  . Essential hypertension, benign 04/07/2015  . History of pulmonary embolism   . SBO (small bowel obstruction) ([place]) 12/30/2013  . Malignant neoplasm of upper-outer quadrant of left breast in female, estrogen receptor positive ([place]) 04/07/2013  . Neoplasm of left breast, primary tumor staging category Tis: ductal carcinoma in situ (DCIS) 01/27/2013  . Pyelonephritis, acute 06/05/2012  . Hydronephrosis of right kidney 06/05/2012  . Hyponatremia 06/05/2012  . Hypokalemia 06/05/2012  . Acute kidney injury ([place]) 06/05/2012  . 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 Abnormal finding on cardiovascular stress test, ischemia anterolateral 02/13/2012  . Syncope, ? anginal equivilant 02/13/2012  . Ileostomy in place, secondary to ulcerative colitis x 20 years 02/13/2012  . History of recurrent UTIs 02/13/2012    Past Surgical History:  Procedure Laterality Date  . ABDOMINAL HYSTERECTOMY  1996   partial  . APPENDECTOMY    . BREAST LUMPECTOMY  05/31/11   LEFT BREAST LUMPECTOMY, NEGATIVE MARGINS, HIGH GRADE  DUCTAL  CARCINOMA IN  SITU WITH ASSOCIATED CALCIFICATIONS.  ER:+, PR+,   . BREAST SURGERY  05/03/11   LEFT BREAST NEEDLE CORE BIOPSY- DCIS  . CARDIAC CATHETERIZATION    . CATARACT EXTRACTION Left   . COLECTOMY  1973  . EXPLORATORY LAPAROTOMY  1978/1990   with lysis of adhesions  . exploratory laps     several due to abd pain related to ulcerative colitis  . EYE SURGERY  05/08/2011   left cataract removal   . ILEOSTOMY  1973  . IR GENERIC HISTORICAL  03/08/2016   IR VENOCAVAGRAM IVC 03/08/2016 Arne Cleveland, MD [facility]  . IR GENERIC HISTORICAL  03/08/2016   IR US GUIDE VASC ACCESS RIGHT 03/08/2016 Arne Cleveland, MD [facility]  . LAPAROSCOPIC NEPHRECTOMY Right 08/14/2012   Procedure: RIGHT LAPAROSCOPIC RETROPERITONEAL LAPAROSCOPIC NEPHRECTOMY ;  Surgeon: Alexis Frock, MD;  Location: [facility] ORS;  Service: Urology;  Laterality: Right;  RIGHT LAPAROSCOPIC RETROPERITONEAL LAPAROSCOPIC NEPHRECTOMY, POSSIBLE OPEN   . LEFT HEART CATHETERIZATION WITH CORONARY ANGIOGRAM N/A 02/13/2012   Procedure: LEFT HEART CATHETERIZATION WITH CORONARY ANGIOGRAM;  Surgeon: Lorretta Harp, MD;  Location: [facility] CATH LAB;  Service: Cardiovascular;  Laterality: N/A;  . ROTATOR CUFF REPAIR  2006   Right  . TOTAL KNEE ARTHROPLASTY Left 04/01/2015   Procedure: LEFT TOTAL KNEE ARTHROPLASTY;  Surgeon: Susa Day, MD;  Location: [facility] OR[facility];  Service: Orthopedics;  Laterality: Left;  . TOTAL KNEE ARTHROPLASTY Right 11/04/2015   Procedure: RIGHT TOTAL KNEE ARTHROPLASTY;  Surgeon: Susa Day, MD;  Location: [facility];  Service: Orthopedics;  Laterality: Right;  . TOTAL SHOULDER ARTHROPLASTY    . VAGINOPLASTY  1975     OB History   None      Home Medications    Prior to Admission medications   Medication Sig Start Date End Date Taking? Authorizing Provider  acetaminophen (TYLENOL) 500 MG tablet Take 1,000 mg by mouth every 6 (six) hours as needed for moderate pain.   Yes [provider]  allopurinol (ZYLOPRIM) 300 MG tablet  Take 300 mg by mouth daily.  08/14/17  Yes [provider]  aspirin EC 81 MG tablet Take 81 mg by mouth daily.    Yes [provider]  colchicine 0.6 MG tablet Take 1 tablet (0.6 mg total) by mouth daily. 09/19/17  Yes Deveshwar, Abel Presto, MD  furosemide (LASIX) 20 MG tablet Take 20 mg by mouth.  08/14/17  Yes [provider]  ketoconazole (NIZORAL) 2 % shampoo Apply 1 application topically as needed for irritation.  08/21/17  Yes [provider]  Lactobacillus Rhamnosus, GG, (CULTURELLE) CAPS Take 1 capsule by mouth daily.    Yes [provider]  MYORISAN 40 MG capsule Take 40 mg by mouth 2 (two) times a week.  05/31/17  Yes [provider]  nitrofurantoin (MACRODANTIN) 100 MG capsule Take 100 mg by mouth daily.    Yes [provider]  potassium chloride SA (K-DUR,KLOR-CON) 20 MEQ tablet Take 1 tablet (20 mEq total) by mouth daily. 06/23/14  Yes Magrinat, Virgie Dad, MD  TURMERIC PO Take 1 tablet by mouth daily.   Yes [provider]  predniSONE (DELTASONE) 5 MG tablet Take 1 tablet (5 mg total) by mouth daily with breakfast. Patient not taking: Reported on 12/30/2017 09/19/17   Bo Merino, MD    Family History Family History  Problem Relation Age of Onset  . Cancer Other        Breast  . Atrial fibrillation Mother   . Heart attack Father   . Heart attack Paternal Uncle   . Breast cancer Maternal Grandmother   . Stomach cancer Maternal Grandfather   . Cancer Paternal Grandmother        stomach  . Atrial fibrillation Brother     Social History Social History   Tobacco Use  . Smoking status: Never Smoker  . Smokeless tobacco: Never Used  Substance Use Topics  . Alcohol use: No  . Drug use: No     Allergies   Bactrim [sulfamethoxazole-trimethoprim]; Demerol; Dilaudid [hydromorphone hcl]; Reglan [metoclopramide]; Stadol [butorphanol tartrate]; Cephalexin; and Penicillins   Review of Systems Review of Systems    All other systems reviewed and are negative.    Physical Exam Updated Vital Signs BP (!) 148/74   Pulse 98   Temp 97.7 F (36.5 C) (Oral)   Resp (!) 23   Ht 5' 4"  (1.626 m)   Wt 78.5 kg   SpO2 96%   BMI 29.70 kg/m   Physical Exam  Constitutional: She is oriented to person, place, and time. She appears well-developed and well-nourished.  HENT:  Head: Normocephalic and atraumatic.  Cardiovascular: Regular rhythm.  No murmur heard. Tachycardic  Pulmonary/Chest: Effort normal and breath sounds normal. No respiratory distress.  Abdominal: There is no rebound and no guarding.  Distended abdomen with ileostomy pouch in the right lower quadrant, small amount of stool present. High pitched bowel sounds. Moderate generalized tenderness without guarding or rebound  Musculoskeletal: She exhibits no edema or tenderness.  Neurological: She is alert and oriented to person, place, and time.  Skin: Skin is warm and dry.  Psychiatric: She has a normal mood and affect. Her behavior is normal.  Nursing note and vitals reviewed.    ED Treatments / Results  Labs (all labs ordered are listed, but only abnormal results are displayed) Labs Reviewed  COMPREHENSIVE METABOLIC PANEL - Abnormal; Notable for the following components:      Result Value   Glucose, Bld 145 (*)    BUN 37 (*)    Creatinine, Ser 1.41 (*)    Calcium 10.7 (*)    Total Protein 8.6 (*)    Alkaline Phosphatase 149 (*)    Total Bilirubin 1.5 (*)    GFR calc non Af Amer 37 (*)    GFR calc Af Amer 43 (*)    Anion gap 18 (*)    All other components within normal limits  CBC WITH DIFFERENTIAL/PLATELET - Abnormal; Notable for the following components:   WBC 13.5 (*)    RBC 5.40 (*)    Hemoglobin 15.8 (*)    HCT 47.3 (*)    Neutro Abs 11.7 (*)    All other components within normal limits  I-STAT CHEM 8, ED - Abnormal; Notable for the following components:   BUN 38 (*)    Creatinine, Ser 1.40 (*)    Glucose, Bld 140  (*)    Hemoglobin 17.0 (*)    HCT 50.0 (*)    All other components  within normal limits  LIPASE, BLOOD  URINALYSIS, ROUTINE W REFLEX MICROSCOPIC  HIV ANTIBODY (ROUTINE TESTING)  I-STAT TROPONIN, ED    EKG EKG Interpretation  Date/Time:  Sunday December 30 2017 08:42:36 EDT Ventricular Rate:  68 PR Interval:    QRS Duration: 107 QT Interval:  397 QTC Calculation: 423 R Axis:   137 Text Interpretation:  Right and left arm electrode reversal, interpretation assumes no reversal Sinus rhythm Right axis deviation Abnormal R-wave progression, early transition Baseline wander in lead(s) V3 V4 V6 Confirmed by Quintella Reichert [phone]) on 12/30/2017 8:59:31 AM   Radiology Ct Abdomen Pelvis Wo Contrast  Result Date: 12/30/2017 CLINICAL DATA:  Nausea, vomiting, and abdominal pain. Patient states she has a block ileostomy. EXAM: CT ABDOMEN AND PELVIS WITHOUT CONTRAST TECHNIQUE: Multidetector CT imaging of the abdomen and pelvis was performed following the standard protocol without IV contrast. COMPARISON:  CT scan June 30, 2016 FINDINGS: Lower chest: Left lumpectomy changes. Small hiatal hernia. No nodules or masses in the lung bases. No suspicious infiltrates. Scattered atelectasis. Hepatobiliary: Hepatic steatosis.  The gallbladder is unremarkable. Pancreas: Unremarkable. No pancreatic ductal dilatation or surrounding inflammatory changes. Spleen: Normal in size without focal abnormality. Adrenals/Urinary Tract: The left adrenal gland is normal. Multiple tiny stones are seen in the left kidney without hydronephrosis or perinephric stranding. The left ureter is normal. The bladder is unremarkable. The patient is status post right nephrectomy. The right adrenal gland is unremarkable. Stomach/Bowel: There is a small hiatal hernia. The stomach is unremarkable. The small bowel is diffusely distended along much of its length. The most distal small bowel is decompressed. The transition point is in the pelvis.  While the transition point is not discretely seen, I suspect it is in the anterior mid to lower pelvis. Patient is status post colectomy. The rectal stump is unremarkable. Vascular/Lymphatic: Mild atherosclerosis in the tortuous but nonaneurysmal aorta. No adenopathy. An IVC filter is identified. Reproductive: Patient is status post hysterectomy. No adnexal masses are noted. Other: No free air or free fluid. Musculoskeletal: No acute or significant osseous findings. IMPRESSION: 1. Small bowel obstruction. The majority of the small bowel is distended. The transition point is in the pelvis, likely the anterior mid to lower pelvis. The most distal small bowel is decompressed. There is a right lower quadrant ileostomy. 2. Multiple tiny nonobstructive stones in the left kidney. 3. Previous right nephrectomy. 4. Colectomy. 5. Atherosclerosis in the nonaneurysmal aorta. Electronically Signed   By: Dorise Bullion III M.D   On: 12/30/2017 10:42   Dg Abdomen 1 View  Result Date: 12/30/2017 CLINICAL DATA:  67 year old female with a history of nasogastric tube placement EXAM: ABDOMEN - 1 VIEW COMPARISON:  CT 12/30/2017 FINDINGS: Plain-film upper abdomen demonstrates gastric tube terminating in the stomach. IVC filter in place. Gas within stomach and small bowel, as well as colon. IMPRESSION: Limited plain film demonstrates gastric tube terminating in the stomach. Electronically Signed   By: Corrie Mckusick D.O.   On: 12/30/2017 12:43    Procedures Procedures (including critical care time)  Medications Ordered in ED Medications  enoxaparin (LOVENOX) injection 40 mg (has no administration in time range)  dextrose 5 % and 0.45 % NaCl with KCl 20 mEq/L infusion (has no administration in time range)  acetaminophen (TYLENOL) tablet 650 mg (has no administration in time range)    Or  acetaminophen (TYLENOL) suppository 650 mg (has no administration in time range)  oxyCODONE (Oxy IR/ROXICODONE) immediate release tablet 5  mg (has no  administration in time range)  morphine 2 MG/ML injection 2 mg (has no administration in time range)  ondansetron (ZOFRAN-ODT) disintegrating tablet 4 mg (has no administration in time range)    Or  ondansetron (ZOFRAN) injection 4 mg (has no administration in time range)  metoprolol tartrate (LOPRESSOR) injection 5 mg (has no administration in time range)  diatrizoate meglumine-sodium (GASTROGRAFIN) 66-10 % solution 90 mL (has no administration in time range)  diphenhydrAMINE (BENADRYL) 12.5 MG/5ML elixir 12.5 mg (has no administration in time range)    Or  diphenhydrAMINE (BENADRYL) injection 12.5 mg (has no administration in time range)  sodium chloride 0.9 % bolus 1,000 mL (0 mLs Intravenous Stopped 12/30/17 1128)  ondansetron (ZOFRAN) injection 4 mg (4 mg Intravenous Given 12/30/17 0941)  fentaNYL (SUBLIMAZE) injection 50 mcg (50 mcg Intravenous Given 12/30/17 0941)  lidocaine (XYLOCAINE) 2 % jelly 1 application (1 application Topical Given 12/30/17 1139)     Initial Impression / Assessment and Plan / ED Course  I have reviewed the triage vital signs and the nursing notes.  Pertinent labs & imaging results that were available during my care of the patient were reviewed by me and considered in my medical decision making (see chart for details).     Patient with history of prior bowel obstruction here for evaluation of abdominal pain and vomiting similar to prior obstructions. She is uncomfortable on examination with abdominal distention. She was treated with pain medications, fluids and antiemetics. CT abdomen is consistent with bowel obstruction and NG tube was placed for decompression. Discussed with Dr. Kieth Brightly with general surgery, who will evaluate the patient. Patient updated of findings of studies and recommendation for NGT and admission and  she is in agreement with treatment plan.  Final Clinical Impressions(s) / ED Diagnoses   Final diagnoses:  Encounter for imaging  study to confirm nasogastric (NG) tube placement  Small bowel obstruction [facility])    ED Discharge Orders    None       Quintella Reichert, MD 12/30/17 1701

## 2017-12-30 NOTE — ED Notes (Signed)
Pt cannot use restroom at this time, aware urine specimen is needed.  

## 2017-12-30 NOTE — H&P (Signed)
Deanna Burke is an 67 y.o. female.   Chief Complaint: nausea and vomiting HPI: 67 yo female with distant history of UC s/p proctocolectomy with end ileostomy in 1973. She had had multiple bowel obstructions last operated in 1990's by Dr. Excell Seltzer. She began having pain yesterday and has vomited multiple times. She continues to have abdominal pain. Last ostomy output was yesterday.   Past Medical History:  Diagnosis Date  . Abnormal finding on cardiovascular stress test, ischemia anterolateral 02/13/2012   follow heart cath - pt told one tiny blockage that didn't even matter  . Arthritis    hands, lumbar spine, hips, right ankle  . Breast cancer ([place])   . 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 Cataract    Left Eye  . Complication of anesthesia 1996   pt has ileostomy and had surgery in 1996 that paralyzed  . Fatigue   . H/O nephrostomy 08/13/12  . H/O ulcerative colitis   . History of bone density study 2012  . History of recurrent UTIs 02/13/2012  . Hot flashes   . Hypertension   . Ileostomy in place, secondary to ulcerative colitis x 20 years 02/13/2012  . 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 Large bowel perforation ([place]) 1971/1996  . Phlebitis 1971  . Pulmonary embolus ([place]) 1971  . S/P ileostomy ([place])   . S/P radiation therapy 07/24/11 - 09/06/11   Left Breast/ 5000 cGy in 25 Fractions with Boost of 1000 cGy in 5 Fractions  . Scoliosis   . Sleep apnea 2009   uses cpap-setting is 1  . Small bowel obstruction ([place]) [id number]  . Small bowel perforation ([place]) 1971/1996  . Syncope, ? anginal equivilant 02/13/2012  . Torn rotator cuff 2005  . Ulcerative colitis [id number]    Past Surgical History:  Procedure Laterality Date  . ABDOMINAL HYSTERECTOMY  1996   partial  . APPENDECTOMY    . 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  ER:+, PR+,   .  BREAST SURGERY  05/03/11   LEFT BREAST NEEDLE CORE BIOPSY- DCIS  . CARDIAC CATHETERIZATION    . CATARACT EXTRACTION Left   . COLECTOMY  1973  . EXPLORATORY LAPAROTOMY  1978/1990   with lysis of adhesions  . exploratory laps     several due to abd pain related to ulcerative colitis  . EYE SURGERY  05/08/2011   left cataract removal   . ILEOSTOMY  1973  . IR GENERIC HISTORICAL  03/08/2016   IR VENOCAVAGRAM IVC 03/08/2016 Arne Cleveland, MD [facility]  . IR GENERIC HISTORICAL  03/08/2016   IR US GUIDE VASC ACCESS RIGHT 03/08/2016 Arne Cleveland, MD [facility]  . LAPAROSCOPIC NEPHRECTOMY Right 08/14/2012   Procedure: RIGHT LAPAROSCOPIC RETROPERITONEAL LAPAROSCOPIC NEPHRECTOMY ;  Surgeon: Alexis Frock, MD;  Location: [facility];  Service: Urology;  Laterality: Right;  RIGHT LAPAROSCOPIC RETROPERITONEAL LAPAROSCOPIC NEPHRECTOMY, POSSIBLE OPEN   . LEFT HEART CATHETERIZATION WITH CORONARY ANGIOGRAM N/A 02/13/2012   Procedure: LEFT HEART CATHETERIZATION WITH CORONARY ANGIOGRAM;  Surgeon: Lorretta Harp, MD;  Location: [facility] CATH LAB;  Service: Cardiovascular;  Laterality: N/A;  . ROTATOR CUFF REPAIR  2006   Right  . TOTAL KNEE ARTHROPLASTY Left 04/01/2015   Procedure: LEFT TOTAL KNEE ARTHROPLASTY;  Surgeon: Susa Day, MD;  Location: [facility];  Service: Orthopedics;  Laterality: Left;  .  TOTAL KNEE ARTHROPLASTY Right 11/04/2015   Procedure: RIGHT TOTAL KNEE ARTHROPLASTY;  Surgeon: Susa Day, MD;  Location: [facility];  Service: Orthopedics;  Laterality: Right;  . TOTAL SHOULDER ARTHROPLASTY    . VAGINOPLASTY  46    Family History  Problem Relation Age of Onset  . Cancer Other        Breast  . Atrial fibrillation Mother   . Heart attack Father   . Heart attack Paternal Uncle   . Breast cancer Maternal Grandmother   . Stomach cancer Maternal Grandfather   . Cancer Paternal Grandmother        stomach  . Atrial fibrillation Brother    Social History:  reports that she has never smoked.  She has never used smokeless tobacco. She reports that she does not drink alcohol or use drugs.  Allergies:  Allergies  Allergen Reactions  . Bactrim [Sulfamethoxazole-Trimethoprim] Nausea And Vomiting  . Demerol Nausea And Vomiting  . Dilaudid [Hydromorphone Hcl] Nausea And Vomiting and Other (See Comments)    Muscle cramping  . Reglan [Metoclopramide] Other (See Comments)    Cramps in hands  . Stadol [Butorphanol Tartrate] Nausea And Vomiting    Injection  . Cephalexin Rash  . Penicillins Rash    Rash only; note also rash to Keflex Has patient had a PCN reaction causing immediate rash, facial/tongue/throat swelling, SOB or lightheadedness with hypotension: rash Has patient had a PCN reaction causing severe rash involving mucus membranes or skin necrosis: no Has patient had a PCN reaction that required hospitalization no Has patient had a PCN reaction occurring within the last 10 years: no If all of the above answers are "NO", then may proceed with Cephalosporin use.     (Not in a hospital admission)  Results for orders placed or performed during the hospital encounter of 12/30/17 (from the past 48 hour(s))  Comprehensive metabolic panel     Status: Abnormal   Collection Time: 12/30/17  9:40 AM  Result Value Ref Range   Sodium 140 135 - 145 mmol/L   Potassium 4.0 3.5 - 5.1 mmol/L   Chloride 100 98 - 111 mmol/L   CO2 22 22 - 32 mmol/L   Glucose, Bld 145 (H) 70 - 99 mg/dL   BUN 37 (H) 8 - 23 mg/dL   Creatinine, Ser 1.41 (H) 0.44 - 1.00 mg/dL   Calcium 10.7 (H) 8.9 - 10.3 mg/dL   Total Protein 8.6 (H) 6.5 - 8.1 g/dL   Albumin 4.7 3.5 - 5.0 g/dL   AST 34 15 - 41 U/L   ALT 27 0 - 44 U/L   Alkaline Phosphatase 149 (H) 38 - 126 U/L   Total Bilirubin 1.5 (H) 0.3 - 1.2 mg/dL   GFR calc non Af Amer 37 (L) >60 mL/min   GFR calc Af Amer 43 (L) >60 mL/min    Comment: (NOTE) The eGFR has been calculated using the CKD EPI equation. This calculation has not been validated in all  clinical situations. eGFR's persistently <60 mL/min signify possible Chronic Kidney Disease.    Anion gap 18 (H) 5 - 15    Comment: Performed at [facility], [place] [address], [place], [place] [address]  CBC with Differential     Status: Abnormal   Collection Time: 12/30/17  9:40 AM  Result Value Ref Range   WBC 13.5 (H) 4.0 - 10.5 K/uL   RBC 5.40 (H) 3.87 - 5.11 MIL/uL   Hemoglobin 15.8 (H) 12.0 - 15.0 g/dL  HCT 47.3 (H) 36.0 - 46.0 %   MCV 87.6 78.0 - 100.0 fL   MCH 29.3 26.0 - 34.0 pg   MCHC 33.4 30.0 - 36.0 g/dL   RDW 14.2 11.5 - 15.5 %   Platelets 331 150 - 400 K/uL   Neutrophils Relative % 87 %   Neutro Abs 11.7 (H) 1.7 - 7.7 K/uL   Lymphocytes Relative 5 %   Lymphs Abs 0.7 0.7 - 4.0 K/uL   Monocytes Relative 7 %   Monocytes Absolute 1.0 0.1 - 1.0 K/uL   Eosinophils Relative 1 %   Eosinophils Absolute 0.1 0.0 - 0.7 K/uL   Basophils Relative 0 %   Basophils Absolute 0.0 0.0 - 0.1 K/uL    Comment: Performed at [facility], [place] [address] [place], [place] [address]  Lipase, blood     Status: None   Collection Time: 12/30/17  9:40 AM  Result Value Ref Range   Lipase 51 11 - 51 U/L    Comment: Performed at [facility], [place] [address] [place], [place] [address]  I-stat troponin, ED     Status: None   Collection Time: 12/30/17  9:45 AM  Result Value Ref Range   Troponin i, poc 0.02 0.00 - 0.08 ng/mL   Comment 3            Comment: Due to the release kinetics of cTnI, a negative result within the first hours of the onset of symptoms does not rule out myocardial infarction with certainty. If myocardial infarction is still suspected, repeat the test at appropriate intervals.   I-stat Chem 8, ED     Status: Abnormal   Collection Time: 12/30/17  9:47 AM  Result Value Ref Range   Sodium 139 135 - 145 mmol/L   Potassium 4.0 3.5 - 5.1 mmol/L   Chloride 104 98 - 111 mmol/L   BUN 38 (H) 8 - 23 mg/dL   Creatinine,  Ser 1.40 (H) 0.44 - 1.00 mg/dL   Glucose, Bld 140 (H) 70 - 99 mg/dL   Calcium, Ion 1.24 1.15 - 1.40 mmol/L   TCO2 24 22 - 32 mmol/L   Hemoglobin 17.0 (H) 12.0 - 15.0 g/dL   HCT 50.0 (H) 36.0 - 46.0 %   Ct Abdomen Pelvis Wo Contrast  Result Date: 12/30/2017 CLINICAL DATA:  Nausea, vomiting, and abdominal pain. Patient states she has a block ileostomy. EXAM: CT ABDOMEN AND PELVIS WITHOUT CONTRAST TECHNIQUE: Multidetector CT imaging of the abdomen and pelvis was performed following the standard protocol without IV contrast. COMPARISON:  CT scan June 30, 2016 FINDINGS: Lower chest: Left lumpectomy changes. Small hiatal hernia. No nodules or masses in the lung bases. No suspicious infiltrates. Scattered atelectasis. Hepatobiliary: Hepatic steatosis.  The gallbladder is unremarkable. Pancreas: Unremarkable. No pancreatic ductal dilatation or surrounding inflammatory changes. Spleen: Normal in size without focal abnormality. Adrenals/Urinary Tract: The left adrenal gland is normal. Multiple tiny stones are seen in the left kidney without hydronephrosis or perinephric stranding. The left ureter is normal. The bladder is unremarkable. The patient is status post right nephrectomy. The right adrenal gland is unremarkable. Stomach/Bowel: There is a small hiatal hernia. The stomach is unremarkable. The small bowel is diffusely distended along much of its length. The most distal small bowel is decompressed. The transition point is in the pelvis. While the transition point is not discretely seen, I suspect it is in the anterior mid to lower pelvis. Patient is status post colectomy. The  rectal stump is unremarkable. Vascular/Lymphatic: Mild atherosclerosis in the tortuous but nonaneurysmal aorta. No adenopathy. An IVC filter is identified. Reproductive: Patient is status post hysterectomy. No adnexal masses are noted. Other: No free air or free fluid. Musculoskeletal: No acute or significant osseous findings. IMPRESSION: 1.  Small bowel obstruction. The majority of the small bowel is distended. The transition point is in the pelvis, likely the anterior mid to lower pelvis. The most distal small bowel is decompressed. There is a right lower quadrant ileostomy. 2. Multiple tiny nonobstructive stones in the left kidney. 3. Previous right nephrectomy. 4. Colectomy. 5. Atherosclerosis in the nonaneurysmal aorta. Electronically Signed   By: Dorise Bullion III M.D   On: 12/30/2017 10:42   Dg Abdomen 1 View  Result Date: 12/30/2017 CLINICAL DATA:  67 year old female with a history of nasogastric tube placement EXAM: ABDOMEN - 1 VIEW COMPARISON:  CT 12/30/2017 FINDINGS: Plain-film upper abdomen demonstrates gastric tube terminating in the stomach. IVC filter in place. Gas within stomach and small bowel, as well as colon. IMPRESSION: Limited plain film demonstrates gastric tube terminating in the stomach. Electronically Signed   By: Corrie Mckusick D.O.   On: 12/30/2017 12:43    Review of Systems  Constitutional: Negative for chills and fever.  HENT: Negative for hearing loss.   Eyes: Negative for blurred vision and double vision.  Respiratory: Negative for cough and hemoptysis.   Cardiovascular: Negative for chest pain and palpitations.  Gastrointestinal: Positive for abdominal pain, nausea and vomiting.  Genitourinary: Negative for dysuria and urgency.  Musculoskeletal: Negative for myalgias and neck pain.  Skin: Negative for itching and rash.  Neurological: Negative for dizziness, tingling and headaches.  Endo/Heme/Allergies: Does not bruise/bleed easily.  Psychiatric/Behavioral: Negative for depression and suicidal ideas.    Blood pressure 123/63, pulse 77, temperature 97.7 F (36.5 C), temperature source Oral, resp. rate (!) 24, height 5' 4" (1.626 m), weight 78.5 kg, SpO2 98 %. Physical Exam  Vitals reviewed. Constitutional: She is oriented to person, place, and time. She appears well-developed and well-nourished.   HENT:  Head: Normocephalic and atraumatic.  Eyes: Pupils are equal, round, and reactive to light. Conjunctivae and EOM are normal.  Neck: Normal range of motion. Neck supple.  Cardiovascular: Normal rate and regular rhythm.  Respiratory: Effort normal and breath sounds normal.  GI: Soft. Bowel sounds are normal. She exhibits no distension. There is generalized tenderness.  Right sided ostomy with small amount of semisolid brown stool in bag and some air  Musculoskeletal: Normal range of motion.  Neurological: She is alert and oriented to person, place, and time.  Skin: Skin is warm and dry.  Psychiatric: She has a normal mood and affect. Her behavior is normal.     Assessment/Plan 67 yo female with proctocolectomy and end ileostomy presents with bowel obstruction with transition point.  -admit to surgery -NG tube and small bowel protocol -pain control -serial exams  Mickeal Skinner, MD 12/30/2017, 3:33 PM

## 2017-12-31 ENCOUNTER — Inpatient Hospital Stay (HOSPITAL_COMMUNITY): Payer: Medicare Other

## 2017-12-31 ENCOUNTER — Other Ambulatory Visit: Payer: Self-pay

## 2017-12-31 LAB — COMPREHENSIVE METABOLIC PANEL
ALT: 26 U/L (ref 0–44)
AST: 28 U/L (ref 15–41)
Albumin: 3.8 g/dL (ref 3.5–5.0)
Alkaline Phosphatase: 117 U/L (ref 38–126)
Anion gap: 11 (ref 5–15)
BUN: 31 mg/dL — ABNORMAL HIGH (ref 8–23)
CHLORIDE: 106 mmol/L (ref 98–111)
CO2: 28 mmol/L (ref 22–32)
Calcium: 9.7 mg/dL (ref 8.9–10.3)
Creatinine, Ser: 1.24 mg/dL — ABNORMAL HIGH (ref 0.44–1.00)
GFR calc Af Amer: 51 mL/min — ABNORMAL LOW (ref >60)
GFR calc non Af Amer: 44 mL/min — ABNORMAL LOW (ref >60)
Glucose, Bld: 143 mg/dL — ABNORMAL HIGH (ref 70–99)
Potassium: 4 mmol/L (ref 3.5–5.1)
SODIUM: 145 mmol/L (ref 135–145)
Total Bilirubin: 1.9 mg/dL — ABNORMAL HIGH (ref 0.3–1.2)
Total Protein: 7.4 g/dL (ref 6.5–8.1)

## 2017-12-31 LAB — HIV ANTIBODY (ROUTINE TESTING W REFLEX): HIV SCREEN 4TH GENERATION: NONREACTIVE

## 2017-12-31 LAB — URINALYSIS, ROUTINE W REFLEX MICROSCOPIC
Bilirubin Urine: NEGATIVE
GLUCOSE, UA: NEGATIVE mg/dL
Hgb urine dipstick: NEGATIVE
KETONES UR: NEGATIVE mg/dL
LEUKOCYTES UA: NEGATIVE
NITRITE: NEGATIVE
PROTEIN: NEGATIVE mg/dL
Specific Gravity, Urine: 1.026 (ref 1.005–1.030)
pH: 5 (ref 5.0–8.0)

## 2017-12-31 LAB — CBC
HCT: 42.6 % (ref 36.0–46.0)
Hemoglobin: 14 g/dL (ref 12.0–15.0)
MCH: 28.6 pg (ref 26.0–34.0)
MCHC: 32.9 g/dL (ref 30.0–36.0)
MCV: 87.1 fL (ref 78.0–100.0)
PLATELETS: 309 10*3/uL (ref 150–400)
RBC: 4.89 MIL/uL (ref 3.87–5.11)
RDW: 14.6 % (ref 11.5–15.5)
WBC: 3.3 10*3/uL — ABNORMAL LOW (ref 4.0–10.5)

## 2017-12-31 MED ORDER — INFLUENZA VAC SPLIT HIGH-DOSE 0.5 ML IM SUSY
0.5000 mL | PREFILLED_SYRINGE | INTRAMUSCULAR | Status: AC
  Administered 2018-01-01: 0.5 mL via INTRAMUSCULAR
  Filled 2017-12-31: qty 0.5

## 2017-12-31 NOTE — Progress Notes (Signed)
[facility] Progress Note     Subjective: CC:  Having some emesis around NG tube. Abdominal pain improved but still present. Reports very tiny amount gas/liquid in ileostomy.   Objective: Vital signs in last 24 hours: Temp:  [97.9 F (36.6 C)] 97.9 F (36.6 C) (09/08 2232) Pulse Rate:  [77-99] 86 (09/09 0419) Resp:  [15-24] 18 (09/09 0419) BP: (110-148)/(63-77) 143/67 (09/09 0419) SpO2:  [95 %-99 %] 99 % (09/09 0419) Last BM Date: 12/30/17  Intake/Output from previous day: 09/08 0701 - 09/09 0700 In: 1730 [I.V.:640; NG/GT:90; IV Piggyback:1000] Out: 525 [Urine:275; Emesis/NG output:250] Intake/Output this shift: Total I/O In: -  Out: 300 [Emesis/NG output:300]  PE: Gen:  Alert, NAD, pleasant Card:  Regular rate and rhythm, pedal pulses 2+ BL Pulm:  Normal effort, clear to auscultation bilaterally Abd: Soft, mild TTP LLQ, RLQ ileostomy pouch is flat without gas or stool  NG - >600 cc/24h  Skin: warm and dry, no rashes  Psych: A&Ox3   Lab Results:  Recent Labs    12/30/17 0940 12/30/17 0947 12/31/17 0403  WBC 13.5*  --  3.3*  HGB 15.8* 17.0* 14.0  HCT 47.3* 50.0* 42.6  PLT 331  --  309   BMET Recent Labs    12/30/17 0940 12/30/17 0947 12/31/17 0403  NA 140 139 145  K 4.0 4.0 4.0  CL 100 104 106  CO2 22  --  28  GLUCOSE 145* 140* 143*  BUN 37* 38* 31*  CREATININE 1.41* 1.40* 1.24*  CALCIUM 10.7*  --  9.7   PT/INR No results for input(s): LABPROT, INR in the last 72 hours. CMP     Component Value Date/Time   NA 145 12/31/2017 0403   NA 137 11/12/2015   NA 140 05/03/2015 1402   K 4.0 12/31/2017 0403   K 4.0 05/03/2015 1402   CL 106 12/31/2017 0403   CL 105 10/09/2012 1103   CO2 28 12/31/2017 0403   CO2 25 05/03/2015 1402   GLUCOSE 143 (H) 12/31/2017 0403   GLUCOSE 96 05/03/2015 1402   GLUCOSE 117 (H) 10/09/2012 1103   BUN 31 (H) 12/31/2017 0403   BUN 25 (A) 11/12/2015   BUN 20.6 05/03/2015 1402   CREATININE 1.24 (H) 12/31/2017  0403   CREATININE 1.21 (H) 09/19/2017 0945   CREATININE 1.5 (H) 05/03/2015 1402   CALCIUM 9.7 12/31/2017 0403   CALCIUM 10.1 05/03/2015 1402   PROT 7.4 12/31/2017 0403   PROT 7.7 05/03/2015 1402   ALBUMIN 3.8 12/31/2017 0403   ALBUMIN 3.6 05/03/2015 1402   AST 28 12/31/2017 0403   AST 17 05/03/2015 1402   ALT 26 12/31/2017 0403   ALT <9 05/03/2015 1402   ALKPHOS 117 12/31/2017 0403   ALKPHOS 77 05/03/2015 1402   BILITOT 1.9 (H) 12/31/2017 0403   BILITOT 0.59 05/03/2015 1402   GFRNONAA 44 (L) 12/31/2017 0403   GFRNONAA 46 (L) 09/19/2017 0945   GFRAA 51 (L) 12/31/2017 0403   GFRAA 53 (L) 09/19/2017 0945   Lipase     Component Value Date/Time   LIPASE 51 12/30/2017 0940       Studies/Results: Ct Abdomen Pelvis Wo Contrast  Result Date: 12/30/2017 CLINICAL DATA:  Nausea, vomiting, and abdominal pain. Patient states she has a block ileostomy. EXAM: CT ABDOMEN AND PELVIS WITHOUT CONTRAST TECHNIQUE: Multidetector CT imaging of the abdomen and pelvis was performed following the standard protocol without IV contrast. COMPARISON:  CT scan June 30, 2016 FINDINGS: Lower chest: Left  lumpectomy changes. Small hiatal hernia. No nodules or masses in the lung bases. No suspicious infiltrates. Scattered atelectasis. Hepatobiliary: Hepatic steatosis.  The gallbladder is unremarkable. Pancreas: Unremarkable. No pancreatic ductal dilatation or surrounding inflammatory changes. Spleen: Normal in size without focal abnormality. Adrenals/Urinary Tract: The left adrenal gland is normal. Multiple tiny stones are seen in the left kidney without hydronephrosis or perinephric stranding. The left ureter is normal. The bladder is unremarkable. The patient is status post right nephrectomy. The right adrenal gland is unremarkable. Stomach/Bowel: There is a small hiatal hernia. The stomach is unremarkable. The small bowel is diffusely distended along much of its length. The most distal small bowel is decompressed.  The transition point is in the pelvis. While the transition point is not discretely seen, I suspect it is in the anterior mid to lower pelvis. Patient is status post colectomy. The rectal stump is unremarkable. Vascular/Lymphatic: Mild atherosclerosis in the tortuous but nonaneurysmal aorta. No adenopathy. An IVC filter is identified. Reproductive: Patient is status post hysterectomy. No adnexal masses are noted. Other: No free air or free fluid. Musculoskeletal: No acute or significant osseous findings. IMPRESSION: 1. Small bowel obstruction. The majority of the small bowel is distended. The transition point is in the pelvis, likely the anterior mid to lower pelvis. The most distal small bowel is decompressed. There is a right lower quadrant ileostomy. 2. Multiple tiny nonobstructive stones in the left kidney. 3. Previous right nephrectomy. 4. Colectomy. 5. Atherosclerosis in the nonaneurysmal aorta. Electronically Signed   By: Dorise Bullion III M.D   On: 12/30/2017 10:42   Dg Abdomen 1 View  Result Date: 12/30/2017 CLINICAL DATA:  67 year old female with a history of nasogastric tube placement EXAM: ABDOMEN - 1 VIEW COMPARISON:  CT 12/30/2017 FINDINGS: Plain-film upper abdomen demonstrates gastric tube terminating in the stomach. IVC filter in place. Gas within stomach and small bowel, as well as colon. IMPRESSION: Limited plain film demonstrates gastric tube terminating in the stomach. Electronically Signed   By: Corrie Mckusick D.O.   On: 12/30/2017 12:43   Dg Abd Portable 1v-small Bowel Obstruction Protocol-initial, 8 Hr Delay  Result Date: 12/31/2017 CLINICAL DATA:  Small bowel obstruction protocol, 8 hour delayed film. EXAM: PORTABLE ABDOMEN - 1 VIEW COMPARISON:  Radiographs and CT yesterday. FINDINGS: Administered enteric contrast within small bowel throughout the central abdomen. Small bowel distension again seen. Patient is post near total colectomy. Enteric tube is in place with tip and side-port in  the stomach. Chain sutures noted in the pelvis. Ileostomy not visualized radiographically. Multiple surgical clips in the abdomen. IVC filter visualized. IMPRESSION: Administered enteric contrast within dilated small bowel in the central abdomen. Patient is post near total colectomy, with right lower quadrant ileostomy. Enteric contrast not visualized in the colon, as expected given postsurgical anatomy. Electronically Signed   By: Keith Rake M.D.   On: 12/31/2017 06:23    Anti-infectives: Anti-infectives (From admission, onward)   None     Assessment/Plan SBO, recurrent - PMH proctocolectomy end ileostomy  - SB protocol 9/8 shows contrast within small bowel loops  - minimal flatus and minimal liquid in ileostomy pouch - continue NG tube to LIWS, flush q 8h - repeat AXR in AM  - mobilize/IS   FEN: NPO, IVF, NGT LIWS ID: none VTE: SCD's, lovenox   LOS: 1 day    Obie Dredge, [facility] Pager: [phone]

## 2017-12-31 NOTE — Progress Notes (Signed)
Pt started producing from her ileostomy this evening.

## 2018-01-01 ENCOUNTER — Inpatient Hospital Stay (HOSPITAL_COMMUNITY): Payer: Medicare Other

## 2018-01-01 LAB — COMPREHENSIVE METABOLIC PANEL
ALBUMIN: 3.8 g/dL (ref 3.5–5.0)
ALK PHOS: 125 U/L (ref 38–126)
ALT: 30 U/L (ref 0–44)
ANION GAP: 11 (ref 5–15)
AST: 25 U/L (ref 15–41)
BILIRUBIN TOTAL: 1.4 mg/dL — AB (ref 0.3–1.2)
BUN: 29 mg/dL — AB (ref 8–23)
CALCIUM: 9.8 mg/dL (ref 8.9–10.3)
CO2: 31 mmol/L (ref 22–32)
Chloride: 103 mmol/L (ref 98–111)
Creatinine, Ser: 1.29 mg/dL — ABNORMAL HIGH (ref 0.44–1.00)
GFR calc Af Amer: 48 mL/min — ABNORMAL LOW (ref >60)
GFR calc non Af Amer: 42 mL/min — ABNORMAL LOW (ref >60)
Glucose, Bld: 128 mg/dL — ABNORMAL HIGH (ref 70–99)
Potassium: 3.8 mmol/L (ref 3.5–5.1)
SODIUM: 145 mmol/L (ref 135–145)
TOTAL PROTEIN: 7.5 g/dL (ref 6.5–8.1)

## 2018-01-01 LAB — CBC
HCT: 44 % (ref 36.0–46.0)
Hemoglobin: 13.9 g/dL (ref 12.0–15.0)
MCH: 28.5 pg (ref 26.0–34.0)
MCHC: 31.6 g/dL (ref 30.0–36.0)
MCV: 90.2 fL (ref 78.0–100.0)
Platelets: 297 10*3/uL (ref 150–400)
RBC: 4.88 MIL/uL (ref 3.87–5.11)
RDW: 14.7 % (ref 11.5–15.5)
WBC: 3.3 10*3/uL — ABNORMAL LOW (ref 4.0–10.5)

## 2018-01-01 NOTE — Progress Notes (Signed)
SBO (small bowel obstruction) (HCC)  Subjective: Ileostomy started functioning overnight.  Feels better  Objective: Vital signs in last 24 hours: Temp:  [98.3 F (36.8 C)-98.8 F (37.1 C)] 98.3 F (36.8 C) (09/10 0431) Pulse Rate:  [89-106] 89 (09/10 0431) Resp:  [16-18] 18 (09/10 0431) BP: (123-132)/(77-83) 123/78 (09/10 0431) SpO2:  [95 %-97 %] 96 % (09/10 0431) Last BM Date: 12/31/17(from ostomy )  Intake/Output from previous day: 09/09 0701 - 09/10 0700 In: 1548.7 [I.V.:1498.7; NG/GT:50] Out: 4625 [Urine:900; Emesis/NG output:2475; Stool:1250] Intake/Output this shift: No intake/output data recorded.  General appearance: alert and cooperative GI: normal findings: soft, non-tender Mildly distended  Lab Results:  Results for orders placed or performed during the hospital encounter of 12/30/17 (from the past 24 hour(s))  Comprehensive metabolic panel     Status: Abnormal   Collection Time: 01/01/18  4:29 AM  Result Value Ref Range   Sodium 145 135 - 145 mmol/L   Potassium 3.8 3.5 - 5.1 mmol/L   Chloride 103 98 - 111 mmol/L   CO2 31 22 - 32 mmol/L   Glucose, Bld 128 (H) 70 - 99 mg/dL   BUN 29 (H) 8 - 23 mg/dL   Creatinine, Ser 1.29 (H) 0.44 - 1.00 mg/dL   Calcium 9.8 8.9 - 10.3 mg/dL   Total Protein 7.5 6.5 - 8.1 g/dL   Albumin 3.8 3.5 - 5.0 g/dL   AST 25 15 - 41 U/L   ALT 30 0 - 44 U/L   Alkaline Phosphatase 125 38 - 126 U/L   Total Bilirubin 1.4 (H) 0.3 - 1.2 mg/dL   GFR calc non Af Amer 42 (L) >60 mL/min   GFR calc Af Amer 48 (L) >60 mL/min   Anion gap 11 5 - 15  CBC     Status: Abnormal   Collection Time: 01/01/18  4:29 AM  Result Value Ref Range   WBC 3.3 (L) 4.0 - 10.5 K/uL   RBC 4.88 3.87 - 5.11 MIL/uL   Hemoglobin 13.9 12.0 - 15.0 g/dL   HCT 44.0 36.0 - 46.0 %   MCV 90.2 78.0 - 100.0 fL   MCH 28.5 26.0 - 34.0 pg   MCHC 31.6 30.0 - 36.0 g/dL   RDW 14.7 11.5 - 15.5 %   Platelets 297 150 - 400 K/uL     Studies/Results Radiology     [facility],  Scheduled . Influenza vac split quadrivalent PF  0.5 mL Intramuscular Tomorrow-1000     Assessment: SBO (small bowel obstruction) ([place])   Plan: D/C NG Advance diet as tolerated Possible d/c tom   LOS: 2 days    Rosario Adie, MD [facility], [place] [phone]   01/01/2018 8:45 AM       '

## 2018-01-02 NOTE — Plan of Care (Signed)
Reviewed discharge instructions and copy given to patient. IV removed. Pt ready for discharge.

## 2018-01-02 NOTE — Discharge Summary (Signed)
Deanna Burke Discharge Summary   Patient ID: Deanna Burke MRN: [id number] DOB/AGE: 1950/12/29 67 y.o.  Admit date: 12/30/2017 Discharge date: 01/02/2018 Discharge Diagnosis Patient Active Problem List   Diagnosis Date Noted  . Small bowel obstruction ([place]) 12/30/2017  . History of chronic kidney disease 10/08/2017  . History of kidney stones 10/08/2017  . Traumatic complete tear of right rotator cuff 09/19/2017  . History of osteoporosis 09/19/2017  . Vitamin D deficiency 09/19/2017  . Hypercholesterolemia 09/19/2017  . OSA on CPAP 09/19/2017  . Other insomnia 09/19/2017  . Recurrent UTI 09/19/2017  . Primary osteoarthritis of both hands 09/19/2017  . S/P TKR (total knee replacement), bilateral 09/19/2017  . Ulcerative colitis ([place]) 05/10/2015  . 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 H/O unilateral nephrectomy 05/10/2015  . Essential hypertension, benign 04/07/2015  . History of pulmonary embolism   . SBO (small bowel obstruction) ([place]) 12/30/2013  . Malignant neoplasm of upper-outer quadrant of left breast in female, estrogen receptor positive ([place]) 04/07/2013  . Neoplasm of left breast, primary tumor staging category Tis: ductal carcinoma in situ (DCIS) 01/27/2013  . Pyelonephritis, acute 06/05/2012  . Hydronephrosis of right kidney 06/05/2012  . Hyponatremia 06/05/2012  . Hypokalemia 06/05/2012  . Acute kidney injury ([place]) 06/05/2012  . 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 Abnormal finding on cardiovascular stress test, ischemia anterolateral 02/13/2012  . Syncope, ? anginal equivilant 02/13/2012  . Ileostomy in place, secondary to ulcerative colitis x 20 years 02/13/2012  . History of recurrent UTIs 02/13/2012   Imaging: Dg Abd Portable 1v  Result Date: 01/01/2018 CLINICAL DATA:  Small bowel obstruction EXAM: PORTABLE ABDOMEN - 1 VIEW COMPARISON:  12/31/2017 FINDINGS: Dilated small bowel loops noted in  the mid abdomen compatible with small bowel obstruction. Likely slightly improved since prior study. NG tube tip remains in the distal stomach. IVC filter in place. No free air or organomegaly. IMPRESSION: Continued small bowel obstruction pattern with possible slight improvement. Electronically Signed   By: Rolm Baptise M.D.   On: 01/01/2018 09:17   Procedures none  Hospital Course:  67 y/o F with distant history of UC s/p proctocolectomy with end ileostomy and history of recurrent SBO who presented to [facility] with a cc abdominal pain and vomiting. CT of the abdomen revealed SBO with a transition point and the patient was admitted for further management. Placed on small bowel protocol. Improved with non-operative management. On 01/02/18 vitals were stable, having regular ileostomy output, tolerating PO, mobilizing and medically stable for discharge home.   Physical Exam: General:  Alert, NAD, pleasant, comfortable Abd:  Soft, non-tender, mild distention, ileostomy pouch with stool, +BS  Allergies as of 01/02/2018      Reactions   Bactrim [sulfamethoxazole-trimethoprim] Nausea And Vomiting   Demerol Nausea And Vomiting   Dilaudid [hydromorphone Hcl] Nausea And Vomiting, Other (See Comments)   Muscle cramping   Reglan [metoclopramide] Other (See Comments)   Cramps in hands   Stadol [butorphanol Tartrate] Nausea And Vomiting   Injection   Cephalexin Rash   Penicillins Rash   Rash only; note also rash to Keflex Has patient had a PCN reaction causing immediate rash, facial/tongue/throat swelling, SOB or lightheadedness with hypotension: rash Has patient had a PCN reaction causing severe rash involving mucus membranes or skin necrosis: no Has patient had a PCN reaction that required hospitalization no Has patient had a PCN reaction occurring within the last 10 years: no If all of  the above answers are "NO", then may proceed with Cephalosporin use.      Medication List    STOP taking these  medications   predniSONE 5 MG tablet Commonly known as:  DELTASONE     TAKE these medications   acetaminophen 500 MG tablet Commonly known as:  TYLENOL Take 1,000 mg by mouth every 6 (six) hours as needed for moderate pain.   allopurinol 300 MG tablet Commonly known as:  ZYLOPRIM Take 300 mg by mouth daily.   aspirin EC 81 MG tablet Take 81 mg by mouth daily.   colchicine 0.6 MG tablet Take 1 tablet (0.6 mg total) by mouth daily.   CULTURELLE Caps Take 1 capsule by mouth daily.   furosemide 20 MG tablet Commonly known as:  LASIX Take 20 mg by mouth.   ketoconazole 2 % shampoo Commonly known as:  NIZORAL Apply 1 application topically as needed for irritation.   MYORISAN 40 MG capsule Generic drug:  ISOtretinoin Take 40 mg by mouth 2 (two) times a week.   nitrofurantoin 100 MG capsule Commonly known as:  MACRODANTIN Take 100 mg by mouth daily.   potassium chloride SA 20 MEQ tablet Commonly known as:  K-DUR,KLOR-CON Take 1 tablet (20 mEq total) by mouth daily.   TURMERIC PO Take 1 tablet by mouth daily.       Signed: Obie Burke, [facility] Burke 01/02/2018, 12:36 PM

## 2018-01-02 NOTE — Care Management Important Message (Signed)
Important Message  Patient Details  Name: Deanna Burke MRN: [id number] Date of Birth: 10/11/1950   Medicare Important Message Given:  Yes    Kerin Salen 01/02/2018, 10:38 AMImportant Message  Patient Details  Name: Deanna Burke MRN: [id number] Date of Birth: 09-25-1950   Medicare Important Message Given:  Yes    Kerin Salen 01/02/2018, 10:38 AM

## 2018-01-04 NOTE — Progress Notes (Signed)
Office Visit Note  Patient: Deanna Burke             Date of Birth: 1951/01/01           MRN: [id number]             PCP: [name], MD Referring: [name], MD Visit Date: 01/17/2018 Occupation: @GUAROCC @  Subjective:  Medication monitoring   History of Present Illness: Deanna Burke is a 67 y.o. female with history of gout and osteoarthritis.  She is on Allopurinol 300 mg by mouth daily and Colchicine 0.6 mg po daily.  She denies any recent gout flares.  She denies any feet pain or swelling at this time.  She states she saw Ileana Roup for management of right trochanteric bursitis, which resolved after 5 sessions.  She states she recently had a left shoulder cortisone injection by Dr. Bernadette Hoit PA which resolved the discomfort she was experiencing.  She states bilateral knee replacements are doing well.  She denies any joint pain or joint swelling at this time. She states she was recently hospitalized for a bowel obstruction, but she is feeling better at this time.      Activities of Daily Living:  Patient reports morning stiffness for 5 minutes.   Patient Denies nocturnal pain.  Difficulty dressing/grooming: Denies Difficulty climbing stairs: Denies Difficulty getting out of chair: Denies Difficulty using hands for taps, buttons, cutlery, and/or writing: Denies  Review of Systems  Constitutional: Positive for fatigue.  HENT: Negative for mouth sores, mouth dryness and nose dryness.   Eyes: Negative for pain, visual disturbance and dryness.  Respiratory: Negative for cough, hemoptysis, shortness of breath and difficulty breathing.   Cardiovascular: Negative for chest pain, palpitations, hypertension and swelling in legs/feet.  Gastrointestinal: Negative for blood in stool, constipation and diarrhea.  Endocrine: Negative for increased urination.  Genitourinary: Negative for difficulty urinating and painful urination.  Musculoskeletal: Positive for  arthralgias, joint pain and morning stiffness. Negative for joint swelling, myalgias, muscle weakness, muscle tenderness and myalgias.  Skin: Negative for color change, pallor, rash, hair loss, nodules/bumps, skin tightness, ulcers and sensitivity to sunlight.  Allergic/Immunologic: Negative for susceptible to infections.  Neurological: Negative for dizziness, numbness, headaches and weakness.  Hematological: Negative for bruising/bleeding tendency and swollen glands.  Psychiatric/Behavioral: Positive for sleep disturbance. Negative for depressed mood. The patient is not nervous/anxious.     PMFS History:  Patient Active Problem List   Diagnosis Date Noted  . Small bowel obstruction ([place]) 12/30/2017  . History of chronic kidney disease 10/08/2017  . History of kidney stones 10/08/2017  . Traumatic complete tear of right rotator cuff 09/19/2017  . History of osteoporosis 09/19/2017  . Vitamin D deficiency 09/19/2017  . Hypercholesterolemia 09/19/2017  . OSA on CPAP 09/19/2017  . Other insomnia 09/19/2017  . Recurrent UTI 09/19/2017  . Primary osteoarthritis of both hands 09/19/2017  . S/P TKR (total knee replacement), bilateral 09/19/2017  . Ulcerative colitis ([place]) 05/10/2015  . 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 H/O unilateral nephrectomy 05/10/2015  . Essential hypertension, benign 04/07/2015  . History of pulmonary embolism   . SBO (small bowel obstruction) ([place]) 12/30/2013  . Malignant neoplasm of upper-outer quadrant of left breast in female, estrogen receptor positive ([place]) 04/07/2013  . Neoplasm of left breast, primary tumor staging category Tis: ductal carcinoma in situ (DCIS) 01/27/2013  . Pyelonephritis, acute 06/05/2012  . Hydronephrosis of right kidney 06/05/2012  . Hyponatremia 06/05/2012  . Hypokalemia 06/05/2012  .  Acute kidney injury ([place]) 06/05/2012  . 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 Abnormal finding on  cardiovascular stress test, ischemia anterolateral 02/13/2012  . Syncope, ? anginal equivilant 02/13/2012  . Ileostomy in place, secondary to ulcerative colitis x 20 years 02/13/2012  . History of recurrent UTIs 02/13/2012    Past Medical History:  Diagnosis Date  . Abnormal finding on cardiovascular stress test, ischemia anterolateral 02/13/2012   follow heart cath - pt told one tiny blockage that didn't even matter  . Arthritis    hands, lumbar spine, hips, right ankle  . Breast cancer ([place])   . 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 Cataract    Left Eye  . Complication of anesthesia 1996   pt has ileostomy and had surgery in 1996 that paralyzed  . Fatigue   . H/O nephrostomy 08/13/12  . H/O ulcerative colitis   . History of bone density study 2012  . History of recurrent UTIs 02/13/2012  . Hot flashes   . Hypertension   . Ileostomy in place, secondary to ulcerative colitis x 20 years 02/13/2012  . 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 Large bowel perforation ([place]) 1971/1996  . Phlebitis 1971  . Pulmonary embolus ([place]) 1971  . S/P ileostomy ([place])   . S/P radiation therapy 07/24/11 - 09/06/11   Left Breast/ 5000 cGy in 25 Fractions with Boost of 1000 cGy in 5 Fractions  . Scoliosis   . Sleep apnea 2009   uses cpap-setting is 1  . Small bowel obstruction ([place]) [id number]  . Small bowel perforation ([place]) 1971/1996  . Syncope, ? anginal equivilant 02/13/2012  . Torn rotator cuff 2005  . Ulcerative colitis [phone]    Family History  Problem Relation Age of Onset  . Cancer Other        Breast  . Atrial fibrillation Mother   . Heart attack Father   . Heart attack Paternal Uncle   . Breast cancer Maternal Grandmother   . Stomach cancer Maternal Grandfather   . Cancer Paternal Grandmother        stomach  . Atrial fibrillation Brother    Past Surgical History:  Procedure Laterality Date  . ABDOMINAL HYSTERECTOMY  1996   partial  .  APPENDECTOMY    . 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  ER:+, PR+,   . BREAST SURGERY  05/03/11   LEFT BREAST NEEDLE CORE BIOPSY- DCIS  . CARDIAC CATHETERIZATION    . CATARACT EXTRACTION Left   . COLECTOMY  1973  . EXPLORATORY LAPAROTOMY  1978/1990   with lysis of adhesions  . exploratory laps     several due to abd pain related to ulcerative colitis  . EYE SURGERY  05/08/2011   left cataract removal   . ILEOSTOMY  1973  . IR GENERIC HISTORICAL  03/08/2016   IR VENOCAVAGRAM IVC 03/08/2016 Arne Cleveland, MD [facility]INTER[facility]  . IR GENERIC HISTORICAL  03/08/2016   IR US GUIDE VASC ACCESS RIGHT 03/08/2016 Arne Cleveland, MD [facility]  . JOINT REPLACEMENT    . KNEE ARTHROPLASTY    . LAPAROSCOPIC NEPHRECTOMY Right 08/14/2012   Procedure: RIGHT LAPAROSCOPIC RETROPERITONEAL LAPAROSCOPIC NEPHRECTOMY ;  Surgeon: Alexis Frock, MD;  Location: [facility];  Service: Urology;  Laterality: Right;  RIGHT LAPAROSCOPIC RETROPERITONEAL LAPAROSCOPIC  NEPHRECTOMY, POSSIBLE OPEN   . LEFT HEART CATHETERIZATION WITH CORONARY ANGIOGRAM N/A 02/13/2012   Procedure: LEFT HEART CATHETERIZATION WITH CORONARY ANGIOGRAM;  Surgeon: Lorretta Harp, MD;  Location: [facility] CATH LAB;  Service: Cardiovascular;  Laterality: N/A;  . ROTATOR CUFF REPAIR  2006   Right  . TOTAL KNEE ARTHROPLASTY Left 04/01/2015   Procedure: LEFT TOTAL KNEE ARTHROPLASTY;  Surgeon: Susa Day, MD;  Location: [facility];  Service: Orthopedics;  Laterality: Left;  . TOTAL KNEE ARTHROPLASTY Right 11/04/2015   Procedure: RIGHT TOTAL KNEE ARTHROPLASTY;  Surgeon: Susa Day, MD;  Location: [facility];  Service: Orthopedics;  Laterality: Right;  . TOTAL SHOULDER ARTHROPLASTY    . VAGINOPLASTY  49   Social History   Social History Narrative   Lives alone.  Does not use any assist device.      Objective: Vital Signs: BP 109/69 (BP Location: Right Arm,  Patient Position: Sitting, Cuff Size: Normal)   Pulse 88   Resp 16   Ht 5' 4"  (1.626 m)   Wt 176 lb 3.2 oz (79.9 kg)   BMI 30.24 kg/m    Physical Exam  Constitutional: She is oriented to person, place, and time. She appears well-developed and well-nourished.  HENT:  Head: Normocephalic and atraumatic.  Eyes: Conjunctivae and EOM are normal.  Neck: Normal range of motion.  Cardiovascular: Normal rate, regular rhythm, normal heart sounds and intact distal pulses.  Pulmonary/Chest: Effort normal and breath sounds normal.  Abdominal: Soft. Bowel sounds are normal.  Lymphadenopathy:    She has no cervical adenopathy.  Neurological: She is alert and oriented to person, place, and time.  Skin: Skin is warm and dry. Capillary refill takes less than 2 seconds.  Psychiatric: She has a normal mood and affect. Her behavior is normal.  Nursing note and vitals reviewed.    Musculoskeletal Exam: C-spine, thoracic spine, and lumbar spine good ROM.  No midline spinal tenderness.  No SI joint tenderness.  Full ROM of bilateral shoulder joints with no discomfort.  Palpable lipoma on left shoulder. Elbow joints, wrist joints, MCPs, PIPs, and DIPs good ROM with no synovitis.  PIP and DIP synovial thickening consistent with osteoarthritis.  Complete fist formation bilaterally. Hip joints good ROM with no discomfort.  No tenderness of trochanteric bursa bilaterally. Bilateral knee replacements limited extension.  PIP and DIP synovial thickening consistent with osteoarthritis of both feet.  No tenderness of MTP joints.    CDAI Exam: CDAI Score: Not documented Patient Global Assessment: Not documented; Provider Global Assessment: Not documented Swollen: Not documented; Tender: Not documented Joint Exam   Not documented   There is currently no information documented on the homunculus. Go to the Rheumatology activity and complete the homunculus joint exam.  Investigation: No additional  findings.  Imaging: Ct Abdomen Pelvis Wo Contrast  Result Date: 12/30/2017 CLINICAL DATA:  Nausea, vomiting, and abdominal pain. Patient states she has a block ileostomy. EXAM: CT ABDOMEN AND PELVIS WITHOUT CONTRAST TECHNIQUE: Multidetector CT imaging of the abdomen and pelvis was performed following the standard protocol without IV contrast. COMPARISON:  CT scan June 30, 2016 FINDINGS: Lower chest: Left lumpectomy changes. Small hiatal hernia. No nodules or masses in the lung bases. No suspicious infiltrates. Scattered atelectasis. Hepatobiliary: Hepatic steatosis.  The gallbladder is unremarkable. Pancreas: Unremarkable. No pancreatic ductal dilatation or surrounding inflammatory changes. Spleen: Normal in size without focal abnormality. Adrenals/Urinary Tract: The left adrenal gland is normal. Multiple tiny stones are seen in the left kidney without hydronephrosis or  perinephric stranding. The left ureter is normal. The bladder is unremarkable. The patient is status post right nephrectomy. The right adrenal gland is unremarkable. Stomach/Bowel: There is a small hiatal hernia. The stomach is unremarkable. The small bowel is diffusely distended along much of its length. The most distal small bowel is decompressed. The transition point is in the pelvis. While the transition point is not discretely seen, I suspect it is in the anterior mid to lower pelvis. Patient is status post colectomy. The rectal stump is unremarkable. Vascular/Lymphatic: Mild atherosclerosis in the tortuous but nonaneurysmal aorta. No adenopathy. An IVC filter is identified. Reproductive: Patient is status post hysterectomy. No adnexal masses are noted. Other: No free air or free fluid. Musculoskeletal: No acute or significant osseous findings. IMPRESSION: 1. Small bowel obstruction. The majority of the small bowel is distended. The transition point is in the pelvis, likely the anterior mid to lower pelvis. The most distal small bowel is  decompressed. There is a right lower quadrant ileostomy. 2. Multiple tiny nonobstructive stones in the left kidney. 3. Previous right nephrectomy. 4. Colectomy. 5. Atherosclerosis in the nonaneurysmal aorta. Electronically Signed   By: Dorise Bullion III M.D   On: 12/30/2017 10:42   Dg Abdomen 1 View  Result Date: 12/30/2017 CLINICAL DATA:  67 year old female with a history of nasogastric tube placement EXAM: ABDOMEN - 1 VIEW COMPARISON:  CT 12/30/2017 FINDINGS: Plain-film upper abdomen demonstrates gastric tube terminating in the stomach. IVC filter in place. Gas within stomach and small bowel, as well as colon. IMPRESSION: Limited plain film demonstrates gastric tube terminating in the stomach. Electronically Signed   By: Corrie Mckusick D.O.   On: 12/30/2017 12:43   Dg Abd Portable 1v  Result Date: 01/01/2018 CLINICAL DATA:  Small bowel obstruction EXAM: PORTABLE ABDOMEN - 1 VIEW COMPARISON:  12/31/2017 FINDINGS: Dilated small bowel loops noted in the mid abdomen compatible with small bowel obstruction. Likely slightly improved since prior study. NG tube tip remains in the distal stomach. IVC filter in place. No free air or organomegaly. IMPRESSION: Continued small bowel obstruction pattern with possible slight improvement. Electronically Signed   By: Rolm Baptise M.D.   On: 01/01/2018 09:17   Dg Abd Portable 1v-small Bowel Obstruction Protocol-initial, 8 Hr Delay  Result Date: 12/31/2017 CLINICAL DATA:  Small bowel obstruction protocol, 8 hour delayed film. EXAM: PORTABLE ABDOMEN - 1 VIEW COMPARISON:  Radiographs and CT yesterday. FINDINGS: Administered enteric contrast within small bowel throughout the central abdomen. Small bowel distension again seen. Patient is post near total colectomy. Enteric tube is in place with tip and side-port in the stomach. Chain sutures noted in the pelvis. Ileostomy not visualized radiographically. Multiple surgical clips in the abdomen. IVC filter visualized.  IMPRESSION: Administered enteric contrast within dilated small bowel in the central abdomen. Patient is post near total colectomy, with right lower quadrant ileostomy. Enteric contrast not visualized in the colon, as expected given postsurgical anatomy. Electronically Signed   By: Keith Rake M.D.   On: 12/31/2017 06:23    Recent Labs: Lab Results  Component Value Date   WBC 3.3 (L) 01/01/2018   HGB 13.9 01/01/2018   PLT 297 01/01/2018   NA 145 01/01/2018   K 3.8 01/01/2018   CL 103 01/01/2018   CO2 31 01/01/2018   GLUCOSE 128 (H) 01/01/2018   BUN 29 (H) 01/01/2018   CREATININE 1.29 (H) 01/01/2018   BILITOT 1.4 (H) 01/01/2018   ALKPHOS 125 01/01/2018   AST 25 01/01/2018  ALT 30 01/01/2018   PROT 7.5 01/01/2018   ALBUMIN 3.8 01/01/2018   CALCIUM 9.8 01/01/2018   GFRAA 48 (L) 01/01/2018    Speciality Comments: No specialty comments available.  Procedures:  No procedures performed Allergies: Bactrim [sulfamethoxazole-trimethoprim]; Demerol; Dilaudid  [hydromorphone hcl]; Dilaudid [hydromorphone hcl]; Reglan [metoclopramide]; Stadol [butorphanol tartrate]; Cephalexin; and Penicillins   Assessment / Plan:     Visit Diagnoses: Idiopathic chronic gout of multiple sites without tophus - She has not had any recent gout flares. She has no joint pain, erythema, or joint swelling at this time.  she is clinically doing well on Allopurinol 300 mg po daily and Colchicine 0.6 mg po daily.  we will check uric acid level today.  If uric acid is <6, she will start taking Colchicine 0.6 mg po as needed. She was advised to resume taking Colchicine daily if she develops symptoms of a flare.  She will notify us if she develops increased joint pain or joint swelling. - Plan: Uric acid  Hyperuricemia - We are checking uric acid level today. Plan: Uric acid  Primary osteoarthritis of both hands: She has PIP and DIP synovial thickening consistent with osteoarthritis.  She has complete fist formation  bilaterally.  Joint protection and muscle strengthening were discussed.   S/P TKR (total knee replacement), bilateral: Limited extension bilaterally.  No warmth or effusion.  She has no discomfort at this time.   Traumatic complete tear of right rotator cuff, sequela: She has good ROM with no discomfort at this time.   Trochanteric bursitis of both hips: Resolved.  She went to 5 PT sessions with Ileana Roup.  She has no tenderness on exam today. She was encouraged to continue to perform the stretching exercises on a regular basis.   Other medical conditions are listed as follows:   H/O unilateral nephrectomy  Ileostomy in place, secondary to ulcerative colitis x 20 years - She recently was hospitalized for a bowel obstruction.  She is asymptomatic at this time.   Recurrent UTI  History of chronic kidney disease - Followed up by nephrologist  Vitamin D deficiency - She cannot take vitamin D due to CKD.   History of breast cancer - s/p lumpectomy and RT 03/2011  Essential hypertension  History of osteoporosis  OSA on CPAP  History of anemia  Hypercholesterolemia  History of kidney stones  Other insomnia   Orders: Orders Placed This Encounter  Procedures  . Uric acid   Meds ordered this encounter  Medications  . colchicine 0.6 MG tablet    Sig: Take 1 tablet (0.6 mg total) by mouth daily.    Dispense:  30 tablet    Refill:  2      Follow-Up Instructions: Return in about 6 months (around 07/18/2018) for Gout, Osteoarthritis.   Ofilia Neas, PA-C   I examined and evaluated the patient with Hazel Sams PA.  I detailed discussion with patient regarding gout and its management.  Patient has done really well on allopurinol.  We will check a uric acid today.  If his uric acid is normal.  Colchicine and use only on PRN basis.  A prescription refill for colchicine was given.  Her  trochanteric bursitis has resolved.  The plan of care was discussed as noted above.  Bo Merino, MD  Note - This record has been created using Editor, commissioning.  Chart creation errors have been sought, but may not always  have been located. Such creation errors do not reflect  on  the standard of medical care.

## 2018-01-09 ENCOUNTER — Telehealth: Payer: Self-pay | Admitting: Rheumatology

## 2018-01-09 NOTE — Telephone Encounter (Signed)
Patient calling in reference to foot. Per patient foot is better, no pain. Patient states PCP has her on Allopurinol, and patient wants to know if she needs to continue with Colchicine now that foot is okay? Does she need to keep rov ? Patient also wants to say thank you for sending her to [facility]. She is amazing. Please call patient to discuss.

## 2018-01-09 NOTE — Telephone Encounter (Signed)
Patient advised uric acid is in a desirable range and she may discontinue Clochicine. Patient advised to keep her follow up visit. Patient verbalized understanding.

## 2018-01-09 NOTE — Telephone Encounter (Signed)
Uric acid is in desirable range.  She may discontinue colchicine.  She should keep her follow-up visit.

## 2018-01-17 ENCOUNTER — Encounter: Payer: Self-pay | Admitting: Rheumatology

## 2018-01-17 ENCOUNTER — Ambulatory Visit: Payer: Medicare Other | Admitting: Rheumatology

## 2018-01-17 VITALS — BP 109/69 | HR 88 | Resp 16 | Ht 64.0 in | Wt 176.2 lb

## 2018-01-17 DIAGNOSIS — Z862 Personal history of diseases of the blood and blood-forming organs and certain disorders involving the immune mechanism: Secondary | ICD-10-CM

## 2018-01-17 DIAGNOSIS — E7800 Pure hypercholesterolemia, unspecified: Secondary | ICD-10-CM

## 2018-01-17 DIAGNOSIS — M19041 Primary osteoarthritis, right hand: Secondary | ICD-10-CM | POA: Diagnosis not present

## 2018-01-17 DIAGNOSIS — E790 Hyperuricemia without signs of inflammatory arthritis and tophaceous disease: Secondary | ICD-10-CM | POA: Diagnosis not present

## 2018-01-17 DIAGNOSIS — Z87442 Personal history of urinary calculi: Secondary | ICD-10-CM

## 2018-01-17 DIAGNOSIS — Z853 Personal history of malignant neoplasm of breast: Secondary | ICD-10-CM

## 2018-01-17 DIAGNOSIS — M1A09X0 Idiopathic chronic gout, multiple sites, without tophus (tophi): Secondary | ICD-10-CM

## 2018-01-17 DIAGNOSIS — Z96653 Presence of artificial knee joint, bilateral: Secondary | ICD-10-CM | POA: Diagnosis not present

## 2018-01-17 DIAGNOSIS — Z87448 Personal history of other diseases of urinary system: Secondary | ICD-10-CM

## 2018-01-17 DIAGNOSIS — I10 Essential (primary) hypertension: Secondary | ICD-10-CM

## 2018-01-17 DIAGNOSIS — Z932 Ileostomy status: Secondary | ICD-10-CM

## 2018-01-17 DIAGNOSIS — Z9989 Dependence on other enabling machines and devices: Secondary | ICD-10-CM

## 2018-01-17 DIAGNOSIS — M19042 Primary osteoarthritis, left hand: Secondary | ICD-10-CM

## 2018-01-17 DIAGNOSIS — Z905 Acquired absence of kidney: Secondary | ICD-10-CM

## 2018-01-17 DIAGNOSIS — G4709 Other insomnia: Secondary | ICD-10-CM

## 2018-01-17 DIAGNOSIS — M7061 Trochanteric bursitis, right hip: Secondary | ICD-10-CM

## 2018-01-17 DIAGNOSIS — S46011S Strain of muscle(s) and tendon(s) of the rotator cuff of right shoulder, sequela: Secondary | ICD-10-CM

## 2018-01-17 DIAGNOSIS — N390 Urinary tract infection, site not specified: Secondary | ICD-10-CM

## 2018-01-17 DIAGNOSIS — M7062 Trochanteric bursitis, left hip: Secondary | ICD-10-CM

## 2018-01-17 DIAGNOSIS — E559 Vitamin D deficiency, unspecified: Secondary | ICD-10-CM

## 2018-01-17 DIAGNOSIS — G4733 Obstructive sleep apnea (adult) (pediatric): Secondary | ICD-10-CM

## 2018-01-17 DIAGNOSIS — Z8739 Personal history of other diseases of the musculoskeletal system and connective tissue: Secondary | ICD-10-CM

## 2018-01-17 MED ORDER — COLCHICINE 0.6 MG PO TABS: 0.6000 mg | ORAL_TABLET | Freq: Every day | ORAL | 2 refills | Status: DC

## 2018-01-18 LAB — URIC ACID: URIC ACID, SERUM: 4.7 mg/dL (ref 2.5–7.0)

## 2018-01-18 NOTE — Progress Notes (Signed)
Uric acid is within desirable range.  Please advise patient to continue taking Allopurinol as prescribed.

## 2018-04-19 ENCOUNTER — Other Ambulatory Visit: Payer: Self-pay | Admitting: Oncology

## 2018-04-19 DIAGNOSIS — Z853 Personal history of malignant neoplasm of breast: Secondary | ICD-10-CM

## 2018-05-15 ENCOUNTER — Other Ambulatory Visit: Payer: Self-pay | Admitting: Oncology

## 2018-05-15 ENCOUNTER — Other Ambulatory Visit: Payer: Self-pay

## 2018-05-15 DIAGNOSIS — Z853 Personal history of malignant neoplasm of breast: Secondary | ICD-10-CM

## 2018-05-17 ENCOUNTER — Ambulatory Visit
Admission: RE | Admit: 2018-05-17 | Discharge: 2018-05-17 | Disposition: A | Discharge status: Home / Self Care | Payer: Medicare Other | Source: Ambulatory Visit | Attending: Oncology | Admitting: Oncology

## 2018-05-17 DIAGNOSIS — Z853 Personal history of malignant neoplasm of breast: Secondary | ICD-10-CM

## 2018-05-17 HISTORY — DX: Personal history of irradiation: Z92.3

## 2018-07-18 ENCOUNTER — Ambulatory Visit: Payer: Medicare Other | Admitting: Rheumatology

## 2018-08-01 ENCOUNTER — Telehealth: Payer: Self-pay | Admitting: Adult Health

## 2018-08-01 NOTE — Telephone Encounter (Signed)
Rescheduled 4/27 appt per sch msg. Called patient. No answer. Left msg. Mailed printout.

## 2018-08-19 ENCOUNTER — Encounter: Payer: Medicare Other | Admitting: Adult Health

## 2019-01-01 ENCOUNTER — Telehealth: Payer: Self-pay | Admitting: Adult Health

## 2019-01-01 NOTE — Telephone Encounter (Signed)
Returned patient's phone call regarding cancelling an 09/11 appointment, left a voicemail regarding cancellation of the appointment per patient request.

## 2019-01-03 ENCOUNTER — Inpatient Hospital Stay: Payer: Medicare Other | Attending: Adult Health | Admitting: Adult Health

## 2019-04-16 ENCOUNTER — Other Ambulatory Visit: Payer: Self-pay | Admitting: Oncology

## 2019-04-16 DIAGNOSIS — Z1231 Encounter for screening mammogram for malignant neoplasm of breast: Secondary | ICD-10-CM

## 2019-05-19 ENCOUNTER — Ambulatory Visit: Payer: Medicare Other | Attending: Internal Medicine

## 2019-05-19 DIAGNOSIS — Z23 Encounter for immunization: Secondary | ICD-10-CM | POA: Insufficient documentation

## 2019-05-19 NOTE — Progress Notes (Signed)
   Covid-19 Vaccination Clinic  Name:  Deanna Burke    MRN: [id number] DOB: 06/14/1950  05/19/2019  [name] was observed post Covid-19 immunization for 15 minutes without incidence. She was provided with Vaccine Information Sheet and instruction to access the V-Safe system.   [name] was instructed to call 911 with any severe reactions post vaccine: [name] Difficulty breathing  . Swelling of your face and throat  . A fast heartbeat  . A bad rash all over your body  . Dizziness and weakness    Immunizations Administered    Name Date Dose VIS Date Route   Pfizer COVID-19 Vaccine 05/19/2019  1:45 PM 0.3 mL 04/04/2019 Intramuscular   Manufacturer: [place]   Lot: [id number]   [place]: [id number]

## 2019-06-02 ENCOUNTER — Ambulatory Visit
Admission: RE | Admit: 2019-06-02 | Discharge: 2019-06-02 | Disposition: A | Discharge status: Home / Self Care | Payer: Medicare Other | Source: Ambulatory Visit | Attending: Oncology | Admitting: Oncology

## 2019-06-02 ENCOUNTER — Other Ambulatory Visit: Payer: Self-pay

## 2019-06-02 DIAGNOSIS — Z1231 Encounter for screening mammogram for malignant neoplasm of breast: Secondary | ICD-10-CM

## 2019-06-04 ENCOUNTER — Other Ambulatory Visit: Payer: Self-pay | Admitting: Surgery

## 2019-06-09 ENCOUNTER — Ambulatory Visit: Payer: Medicare Other | Attending: Internal Medicine

## 2019-06-09 DIAGNOSIS — Z23 Encounter for immunization: Secondary | ICD-10-CM

## 2019-06-09 NOTE — Progress Notes (Signed)
   Covid-19 Vaccination Clinic  Name:  Deanna Burke    MRN: [id number] DOB: 11-22-1950  06/09/2019  [name] was observed post Covid-19 immunization for 15 minutes without incidence. She was provided with Vaccine Information Sheet and instruction to access the V-Safe system.   [name] was instructed to call 911 with any severe reactions post vaccine: [name] Difficulty breathing  . Swelling of your face and throat  . A fast heartbeat  . A bad rash all over your body  . Dizziness and weakness    Immunizations Administered    Name Date Dose VIS Date Route   Pfizer COVID-19 Vaccine 06/09/2019 12:19 PM 0.3 mL 04/04/2019 Intramuscular   Manufacturer: [place]   Lot: [id number]   [place]: [id number]

## 2019-06-16 ENCOUNTER — Other Ambulatory Visit (HOSPITAL_COMMUNITY)
Admission: RE | Admit: 2019-06-16 | Discharge: 2019-06-16 | Disposition: A | Discharge status: Home / Self Care | Payer: Medicare Other | Source: Ambulatory Visit | Attending: Surgery | Admitting: Surgery

## 2019-06-16 DIAGNOSIS — Z20822 Contact with and (suspected) exposure to covid-19: Secondary | ICD-10-CM | POA: Diagnosis not present

## 2019-06-16 DIAGNOSIS — Z01812 Encounter for preprocedural laboratory examination: Secondary | ICD-10-CM | POA: Diagnosis present

## 2019-06-16 LAB — SARS CORONAVIRUS 2 (TAT 6-24 HRS): SARS Coronavirus 2: NEGATIVE

## 2019-06-16 NOTE — Patient Instructions (Addendum)
DUE TO COVID-19 ONLY ONE VISITOR IS ALLOWED TO COME WITH YOU AND STAY IN THE WAITING ROOM ONLY DURING PRE OP AND PROCEDURE DAY OF SURGERY. THE 1 VISITOR MAY VISIT WITH YOU AFTER SURGERY IN YOUR PRIVATE ROOM DURING VISITING HOURS ONLY!  YOU NEED TO HAVE A COVID 19 TEST ON_2/22______ @__2 :40_____, THIS TEST MUST BE DONE BEFORE SURGERY, COME  [address] , [address].  ([place]) ONCE YOUR COVID TEST IS COMPLETED, PLEASE BEGIN THE QUARANTINE INSTRUCTIONS AS OUTLINED IN YOUR HANDOUT.                Deanna Burke    Your procedure is scheduled on: 06/19/19   Report to [facility] Main  Entrance   Report to admitting at    1:45 PM     Call this number if you have problems the morning of surgery [phone]    . BRUSH YOUR TEETH MORNING OF SURGERY AND RINSE YOUR MOUTH OUT, NO CHEWING GUM CANDY OR MINTS.   Do not eat food After Midnight.   YOU MAY HAVE CLEAR LIQUIDS FROM MIDNIGHT UNTIL 12:30 PM.    CLEAR LIQUID DIET   Foods Allowed                                                                     Foods Excluded  Coffee and tea, regular and decaf                             liquids that you cannot  Plain Jell-O any favor except red or purple                                           see through such as: Fruit ices (not with fruit pulp)                                     milk, soups, orange juice  Iced Popsicles                                    All solid food Carbonated beverages, regular and diet                                    Cranberry, grape and apple juices Sports drinks like Gatorade Lightly seasoned clear broth or consume(fat free) Sugar, honey syrup   _____________________________________________________________________     At 12:30 PM Please finish the prescribed Pre-Surgery  Drink  . Nothing by mouth after you finish the Gatorade drink !    Take these medicines the morning of surgery with A SIP OF WATER:  Allopurinol,   Bring your mask and tubing to the hospital with you.  bring ostomy supplies  If you go home the same day as your surgery you must have someone stay with you for 24 hours  after.                               You may not have any metal on your body including hair pins and              piercings  Do not wear jewelry, make-up, lotions, powders or perfumes, deodorant             Do not wear nail polish on your fingernails.  Do not shave  48 hours prior to surgery.             Do not bring valuables to the hospital. [place].  Contacts, dentures or bridgework may not be worn into surgery.                    Please read over the following fact sheets you were given: _____________________________________________________________________             [facility] - Preparing for Surgery  Before surgery, you can play an important role.   Because skin is not sterile, your skin needs to be as free of germs as possible.   You can reduce the number of germs on your skin by washing with CHG (chlorahexidine gluconate) soap before surgery   CHG is an antiseptic cleaner which kills germs and bonds with the skin to continue killing germs even after washing. Please DO NOT use if you have an allergy to CHG or antibacterial soaps.   If your skin becomes reddened/irritated stop using the CHG and inform your nurse when you arrive at Short Stay. Do not shave (including legs and underarms) for at least 48 hours prior to the first CHG shower.    Please follow these instructions carefully:  1.  Shower with CHG Soap the night before surgery and the  morning of Surgery.  2.  If you choose to wash your hair, wash your hair first as usual with your  normal  shampoo.  3.  After you shampoo, rinse your hair and body thoroughly to remove the  shampoo.                                        4.  Use CHG as you would any other liquid soap.  You can apply  chg directly  to the skin and wash                       Gently with a scrungie or clean washcloth.  5.  Apply the CHG Soap to your body ONLY FROM THE NECK DOWN.   Do not use on face/ open                           Wound or open sores. Avoid contact with eyes, ears mouth and genitals (private parts).                       Wash face,  Genitals (private parts) with your normal soap.             6.  Wash thoroughly, paying special attention to the area where your surgery  will be  performed.  7.  Thoroughly rinse your body with warm water from the neck down.  8.  DO NOT shower/wash with your normal soap after using and rinsing off  the CHG Soap.            9.  Pat yourself dry with a clean towel.            10.  Wear clean pajamas.            11.  Place clean sheets on your bed the night of your first shower and do not  sleep with pets. Day of Surgery : Do not apply any lotions/deodorants the morning of surgery.  Please wear clean clothes to the hospital/surgery center.  FAILURE TO FOLLOW THESE INSTRUCTIONS MAY RESULT IN THE CANCELLATION OF YOUR SURGERY PATIENT SIGNATURE_________________________________  NURSE SIGNATURE__________________________________  _   Incentive Spirometer  An incentive spirometer is a tool that can help keep your lungs clear and active. This tool measures how well you are filling your lungs with each breath. Taking long deep breaths may help reverse or decrease the chance of developing breathing (pulmonary) problems (especially infection) following:  A long period of time when you are unable to move or be active. BEFORE THE PROCEDURE   If the spirometer includes an indicator to show your best effort, your nurse or respiratory therapist will set it to a desired goal.  If possible, sit up straight or lean slightly forward. Try not to slouch.  Hold the incentive spirometer in an upright position. INSTRUCTIONS FOR USE  1. Sit on the edge of your bed if possible, or  sit up as far as you can in bed or on a chair. 2. Hold the incentive spirometer in an upright position. 3. Breathe out normally. 4. Place the mouthpiece in your mouth and seal your lips tightly around it. 5. Breathe in slowly and as deeply as possible, raising the piston or the ball toward the top of the column. 6. Hold your breath for 3-5 seconds or for as long as possible. Allow the piston or ball to fall to the bottom of the column. 7. Remove the mouthpiece from your mouth and breathe out normally. 8. Rest for a few seconds and repeat Steps 1 through 7 at least 10 times every 1-2 hours when you are awake. Take your time and take a few normal breaths between deep breaths. 9. The spirometer may include an indicator to show your best effort. Use the indicator as a goal to work toward during each repetition. 10. After each set of 10 deep breaths, practice coughing to be sure your lungs are clear. If you have an incision (the cut made at the time of surgery), support your incision when coughing by placing a pillow or rolled up towels firmly against it. Once you are able to get out of bed, walk around indoors and cough well. You may stop using the incentive spirometer when instructed by your caregiver.  RISKS AND COMPLICATIONS  Take your time so you do not get dizzy or light-headed.  If you are in pain, you may need to take or ask for pain medication before doing incentive spirometry. It is harder to take a deep breath if you are having pain. AFTER USE  Rest and breathe slowly and easily.  It can be helpful to keep track of a log of your progress. Your caregiver can provide you with a simple table to help with this. If you are using the spirometer  at home, follow these instructions: [place] IF:   You are having difficultly using the spirometer.  You have trouble using the spirometer as often as instructed.  Your pain medication is not giving enough relief while using the  spirometer.  You develop fever of 100.5 F (38.1 C) or higher. SEEK IMMEDIATE MEDICAL CARE IF:   You cough up bloody sputum that had not been present before.  You develop fever of 102 F (38.9 C) or greater.  You develop worsening pain at or near the incision site. MAKE SURE YOU:   Understand these instructions.  Will watch your condition.  Will get help right away if you are not doing well or get worse. Document Released: 08/21/2006 Document Revised: 07/03/2011 Document Reviewed: 10/22/2006 [facility] Patient Information 2014 [place], [place].   ________________________________________________________________________

## 2019-06-17 ENCOUNTER — Encounter (HOSPITAL_COMMUNITY): Payer: Self-pay

## 2019-06-17 ENCOUNTER — Other Ambulatory Visit: Payer: Self-pay

## 2019-06-17 ENCOUNTER — Encounter (HOSPITAL_COMMUNITY)
Admission: RE | Admit: 2019-06-17 | Discharge: 2019-06-17 | Disposition: A | Discharge status: Home / Self Care | Payer: Medicare Other | Source: Ambulatory Visit | Attending: Surgery | Admitting: Surgery

## 2019-06-17 DIAGNOSIS — Z01810 Encounter for preprocedural cardiovascular examination: Secondary | ICD-10-CM | POA: Insufficient documentation

## 2019-06-17 DIAGNOSIS — Z01812 Encounter for preprocedural laboratory examination: Secondary | ICD-10-CM | POA: Insufficient documentation

## 2019-06-17 HISTORY — DX: Anemia, unspecified: D64.9

## 2019-06-17 NOTE — Progress Notes (Signed)
PCP - Dr. Driscilla Moats Cardiologist - Dr. Adora Fridge  Chest x-ray - no EKG - 06/18/19 Stress Test - Pt reports that she was on the tread mill test and became SOB and was taken to the cath lab in 2013 ECHO -  Cardiac Cath - 2013  Sleep Study - yes CPAP -Pt does not use it because she is up so many times at night   Fasting Blood Sugar - NA Checks Blood Sugar _____ times a day  Blood Thinner Instructions:ASA. Was prescribed and monitored by Dr. Holley Raring. Pt has not seen the cardiologist in a long time. Aspirin Instructions:No instructions were received. Pt was told to call Dr. Lindwood Qua office Last Dose:unknown  Anesthesia review:   Patient denies shortness of breath, fever, cough and chest pain at PAT appointment  yes Patient verbalized understanding of instructions that were given to them at the PAT appointment. Patient was also instructed that they will need to review over the PAT instructions again at home before surgery. yes

## 2019-06-18 ENCOUNTER — Encounter (HOSPITAL_COMMUNITY)
Admission: RE | Admit: 2019-06-18 | Discharge: 2019-06-18 | Disposition: A | Discharge status: Home / Self Care | Payer: Medicare Other | Source: Ambulatory Visit | Attending: Surgery | Admitting: Surgery

## 2019-06-18 ENCOUNTER — Other Ambulatory Visit: Payer: Self-pay

## 2019-06-18 DIAGNOSIS — Z01810 Encounter for preprocedural cardiovascular examination: Secondary | ICD-10-CM | POA: Diagnosis not present

## 2019-06-18 DIAGNOSIS — Z01812 Encounter for preprocedural laboratory examination: Secondary | ICD-10-CM | POA: Diagnosis present

## 2019-06-18 LAB — BASIC METABOLIC PANEL
Anion gap: 12 (ref 5–15)
BUN: 20 mg/dL (ref 8–23)
CO2: 24 mmol/L (ref 22–32)
Calcium: 9.8 mg/dL (ref 8.9–10.3)
Chloride: 104 mmol/L (ref 98–111)
Creatinine, Ser: 1.13 mg/dL — ABNORMAL HIGH (ref 0.44–1.00)
GFR calc Af Amer: 57 mL/min — ABNORMAL LOW (ref >60)
GFR calc non Af Amer: 49 mL/min — ABNORMAL LOW (ref >60)
Glucose, Bld: 94 mg/dL (ref 70–99)
Potassium: 4 mmol/L (ref 3.5–5.1)
Sodium: 140 mmol/L (ref 135–145)

## 2019-06-18 LAB — CBC
HCT: 39.1 % (ref 36.0–46.0)
Hemoglobin: 11.6 g/dL — ABNORMAL LOW (ref 12.0–15.0)
MCH: 23.9 pg — ABNORMAL LOW (ref 26.0–34.0)
MCHC: 29.7 g/dL — ABNORMAL LOW (ref 30.0–36.0)
MCV: 80.5 fL (ref 80.0–100.0)
Platelets: 363 10*3/uL (ref 150–400)
RBC: 4.86 MIL/uL (ref 3.87–5.11)
RDW: 17.3 % — ABNORMAL HIGH (ref 11.5–15.5)
WBC: 5.3 10*3/uL (ref 4.0–10.5)
nRBC: 0 % (ref 0.0–0.2)

## 2019-06-18 NOTE — H&P (Signed)
Deanna Burke Documented: 06/04/2019 10:41 AM Location: [facility] Patient #: [phone] DOB: 06/08/1950 Divorced / Language: Deanna Burke / Race: White Female   History of Present Illness (Deanna Burke A. Ninfa Linden MD; 06/04/2019 11:06 AM) The patient is a 69 year old female who presents for evaluation of gall stones. This is a 69 year old female who is well-known to our group. I operated on her 8 years ago for breast cancer. 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 She has also had multiple surgeries for bowel obstructions. Her last abdominal surgery was a right nephrectomy in 2014. She has been having attacks of right upper quadrant abdominal pain for several months. It will occur after fatty meals. She has had some nausea and vomiting. She was last admitted to the hospital with about infection in September 2020. She has had no cardiopulmonary issues.   Past Surgical History Deanna Burke, CMA; 06/04/2019 10:41 AM) Appendectomy  Breast Biopsy  Left. Breast Mass; Local Excision  Left. Cataract Surgery  Left. Colon Removal - Complete  Hysterectomy (not due to cancer) - Partial  Knee Surgery  Bilateral. Nephrectomy  Right. Oral Surgery  Resection of Small Bowel  Shoulder Surgery  Right.  Diagnostic Studies History Deanna Burke, CMA; 06/04/2019 10:41 AM) Colonoscopy  never Mammogram  within last year Pap Smear  1-5 years ago  Allergies Deanna Burke, CMA; 06/04/2019 10:48 AM) Penicillins  Keflex *CEPHALOSPORINS*  Demerol *ANALGESICS - OPIOID*  Dilaudid *ANALGESICS - OPIOID*  Reglan *GASTROINTESTINAL AGENTS - MISC.*   Medication History Deanna Burke, CMA; 06/04/2019 10:51 AM) Allopurinol (300MG Tablet, Oral) Active. Furosemide (20MG Tablet, Oral) Active. Potassium Chloride Crys ER (20MEQ Tablet ER, Oral) Active. Nitrofurantoin Macrocrystal (100MG  Capsule, Oral) Active. Aspirin (81MG Tablet, Oral) Active. Bismuth Subgallate (200MG Tablet Chewable, Oral) Active. Probiotic (Oral) Active. Tumeric (500 mg) Active. Vitamin D (Ergocalciferol) (1.25 MG(50000 UT) Capsule, Oral) Active. Ferrous Gluconate (320MG Tablet, Oral) Active. Medications Reconciled  Social History Deanna Burke, CMA; 06/04/2019 10:41 AM) No alcohol use  No caffeine use  No drug use  Tobacco use  Never smoker.  Family History Deanna Burke, CMA; 06/04/2019 10:41 AM) Arthritis  Brother, Father, Mother. Diabetes Mellitus  Family Members In General. Heart Disease  Brother, Father, Mother. Heart disease in female family member before age 16  Heart disease in female family member before age 43  Hypertension  Mother. Kidney Disease  Family Members In General.  Pregnancy / Birth History Deanna Burke, CMA; 06/04/2019 10:41 AM) Age at menarche  69 years. Age of menopause  43-50 Contraceptive History  Oral contraceptives. Gravida  0 Para  0  Other Problems Deanna Burke, CMA; 06/04/2019 10:41 AM) Arthritis  Back Pain  Bladder Problems  Breast Cancer  Cholelithiasis  Chronic Renal Failure Syndrome  Gastroesophageal Reflux Disease  High blood pressure  Kidney Stone  Oophorectomy  Bilateral. Pulmonary Embolism / Blood Clot in Legs  Sleep Apnea  Ulcerative Colitis     Review of Systems (Deanna Burke CMA; 06/04/2019 10:41 AM) General Not Present- Appetite Loss, Chills, Fatigue, Fever, Night Sweats, Weight Gain and Weight Loss. Skin Not Present- Change in Wart/Mole, Dryness, Hives, Jaundice, New Lesions, Non-Healing Wounds, Rash and Ulcer. HEENT Not Present- Earache, Hearing Loss, Hoarseness, Nose Bleed, Oral Ulcers, Ringing in the Ears, Seasonal Allergies, Sinus Pain, Sore Throat, Visual Disturbances, Wears glasses/contact lenses and Yellow Eyes. Respiratory Not Present- Bloody sputum, Chronic Cough, Difficulty Breathing,  Snoring and Wheezing. Breast  Not Present- Breast Mass, Breast Pain, Nipple Discharge and Skin Changes. Cardiovascular Not Present- Chest Pain, Difficulty Breathing Lying Down, Leg Cramps, Palpitations, Rapid Heart Rate, Shortness of Breath and Swelling of Extremities. Gastrointestinal Present- Abdominal Pain and Indigestion. Not Present- Bloating, Bloody Stool, Change in Bowel Habits, Chronic diarrhea, Constipation, Difficulty Swallowing, Excessive gas, Gets full quickly at meals, Hemorrhoids, Nausea, Rectal Pain and Vomiting. Female Genitourinary Present- Frequency and Urgency. Not Present- Nocturia, Painful Urination and Pelvic Pain. Musculoskeletal Present- Back Pain. Not Present- Joint Pain, Joint Stiffness, Muscle Pain, Muscle Weakness and Swelling of Extremities. Neurological Not Present- Decreased Memory, Fainting, Headaches, Numbness, Seizures, Tingling, Tremor, Trouble walking and Weakness. Psychiatric Not Present- Anxiety, Bipolar, Change in Sleep Pattern, Depression, Fearful and Frequent crying. Endocrine Not Present- Cold Intolerance, Excessive Hunger, Hair Changes, Heat Intolerance, Hot flashes and New Diabetes. Hematology Not Present- Blood Thinners, Easy Bruising, Excessive bleeding, Gland problems, HIV and Persistent Infections.  Vitals (Deanna Burke CMA; 06/04/2019 10:46 AM) 06/04/2019 10:46 AM Weight: 178 lb Height: 64in Body Surface Area: 1.86 m Body Mass Index: 30.55 kg/m  Pulse: 111 (Regular)  BP: 130/78 (Sitting, Left Arm, Standard)       Physical Exam (Deanna Burke A. Ninfa Linden MD; 06/04/2019 11:06 AM) The physical exam findings are as follows: Note:She appears well and exam today.  Her only, her abdomen is soft and nontender. There are multiple large well-healed incisions and a right lower quadrant ileostomy.  Her ultrasound shows either one large gallstone for multiple confluent gallstones. There is no gallbladder wall thickening in the bile duct is  normal.    Assessment & Plan (Deanna Burke A. Ninfa Linden MD; 06/04/2019 11:07 AM) SYMPTOMATIC CHOLELITHIASIS (K80.20) Impression: This is a patient with symptomatic gallstones. Cholecystectomy is recommended. I would attempt this laparoscopic but she has a high risk for needing to convert to an open procedure given her multiple abdominal procedures in the past. I gave her literature regarding surgery we discussed gallbladder disease. We discussed the other risks which includes but are not limited to bleeding, infection, injury to surrounding structures, bile duct injury, bile leak, converting to an open procedure, cardiopulmonary issues, etc. She understands and wished to proceed with surgery

## 2019-06-19 ENCOUNTER — Observation Stay (HOSPITAL_COMMUNITY)
Admission: RE | Admit: 2019-06-19 | Discharge: 2019-06-20 | Disposition: A | Discharge status: Home / Self Care | Payer: Medicare Other | Attending: Surgery | Admitting: Surgery

## 2019-06-19 ENCOUNTER — Encounter (HOSPITAL_COMMUNITY): Payer: Self-pay | Admitting: Surgery

## 2019-06-19 ENCOUNTER — Ambulatory Visit (HOSPITAL_COMMUNITY): Payer: Medicare Other | Admitting: Certified Registered Nurse Anesthetist

## 2019-06-19 ENCOUNTER — Other Ambulatory Visit: Payer: Self-pay

## 2019-06-19 ENCOUNTER — Encounter (HOSPITAL_COMMUNITY)
Admission: RE | Disposition: A | Discharge status: Home / Self Care | Payer: Self-pay | Source: Home / Self Care | Attending: Surgery

## 2019-06-19 DIAGNOSIS — Z86711 Personal history of pulmonary embolism: Secondary | ICD-10-CM | POA: Insufficient documentation

## 2019-06-19 DIAGNOSIS — Z9049 Acquired absence of other specified parts of digestive tract: Secondary | ICD-10-CM | POA: Diagnosis not present

## 2019-06-19 DIAGNOSIS — K811 Chronic cholecystitis: Secondary | ICD-10-CM | POA: Diagnosis not present

## 2019-06-19 DIAGNOSIS — Z793 Long term (current) use of hormonal contraceptives: Secondary | ICD-10-CM | POA: Insufficient documentation

## 2019-06-19 DIAGNOSIS — Z905 Acquired absence of kidney: Secondary | ICD-10-CM | POA: Diagnosis not present

## 2019-06-19 DIAGNOSIS — G4730 Sleep apnea, unspecified: Secondary | ICD-10-CM | POA: Insufficient documentation

## 2019-06-19 DIAGNOSIS — K8020 Calculus of gallbladder without cholecystitis without obstruction: Secondary | ICD-10-CM | POA: Diagnosis present

## 2019-06-19 DIAGNOSIS — I10 Essential (primary) hypertension: Secondary | ICD-10-CM | POA: Insufficient documentation

## 2019-06-19 DIAGNOSIS — Z79899 Other long term (current) drug therapy: Secondary | ICD-10-CM | POA: Diagnosis not present

## 2019-06-19 HISTORY — PX: CHOLECYSTECTOMY: SHX55

## 2019-06-19 SURGERY — LAPAROSCOPIC CHOLECYSTECTOMY
Anesthesia: General

## 2019-06-19 MED ORDER — FUROSEMIDE 20 MG PO TABS
20.0000 mg | ORAL_TABLET | Freq: Every day | ORAL | Status: DC
  Administered 2019-06-20: 20 mg via ORAL
  Filled 2019-06-19: qty 1

## 2019-06-19 MED ORDER — ROCURONIUM BROMIDE 10 MG/ML (PF) SYRINGE
PREFILLED_SYRINGE | INTRAVENOUS | Status: AC
  Filled 2019-06-19: qty 10

## 2019-06-19 MED ORDER — EPHEDRINE SULFATE 50 MG/ML IJ SOLN
INTRAMUSCULAR | Status: DC | PRN
  Administered 2019-06-19: 10 mg via INTRAVENOUS

## 2019-06-19 MED ORDER — ONDANSETRON HCL 4 MG/2ML IJ SOLN: 4.0000 mg | Freq: Four times a day (QID) | INTRAMUSCULAR | Status: DC | PRN

## 2019-06-19 MED ORDER — LIDOCAINE HCL (CARDIAC) PF 100 MG/5ML IV SOSY
PREFILLED_SYRINGE | INTRAVENOUS | Status: DC | PRN
  Administered 2019-06-19: 100 mg via INTRAVENOUS

## 2019-06-19 MED ORDER — EPHEDRINE 5 MG/ML INJ
INTRAVENOUS | Status: AC
  Filled 2019-06-19: qty 10

## 2019-06-19 MED ORDER — DEXAMETHASONE SODIUM PHOSPHATE 10 MG/ML IJ SOLN
INTRAMUSCULAR | Status: AC
  Filled 2019-06-19: qty 1

## 2019-06-19 MED ORDER — MORPHINE SULFATE (PF) 2 MG/ML IV SOLN: 1.0000 mg | INTRAVENOUS | Status: DC | PRN

## 2019-06-19 MED ORDER — LIDOCAINE 2% (20 MG/ML) 5 ML SYRINGE
INTRAMUSCULAR | Status: AC
  Filled 2019-06-19: qty 5

## 2019-06-19 MED ORDER — FENTANYL CITRATE (PF) 250 MCG/5ML IJ SOLN
INTRAMUSCULAR | Status: DC | PRN
  Administered 2019-06-19: 100 ug via INTRAVENOUS

## 2019-06-19 MED ORDER — SUGAMMADEX SODIUM 500 MG/5ML IV SOLN
INTRAVENOUS | Status: DC | PRN
  Administered 2019-06-19: 325 mg via INTRAVENOUS

## 2019-06-19 MED ORDER — 0.9 % SODIUM CHLORIDE (POUR BTL) OPTIME
TOPICAL | Status: DC | PRN
  Administered 2019-06-19: 1000 mL

## 2019-06-19 MED ORDER — ENSURE PRE-SURGERY PO LIQD
296.0000 mL | Freq: Once | ORAL | Status: DC
  Filled 2019-06-19: qty 296

## 2019-06-19 MED ORDER — ACETAMINOPHEN 500 MG PO TABS
1000.0000 mg | ORAL_TABLET | ORAL | Status: AC
  Administered 2019-06-19: 1000 mg via ORAL
  Filled 2019-06-19: qty 2

## 2019-06-19 MED ORDER — CIPROFLOXACIN IN D5W 400 MG/200ML IV SOLN
400.0000 mg | INTRAVENOUS | Status: AC
  Administered 2019-06-19: 400 mg via INTRAVENOUS
  Filled 2019-06-19: qty 200

## 2019-06-19 MED ORDER — PROPOFOL 10 MG/ML IV BOLUS
INTRAVENOUS | Status: DC | PRN
  Administered 2019-06-19: 140 mg via INTRAVENOUS

## 2019-06-19 MED ORDER — FENTANYL CITRATE (PF) 250 MCG/5ML IJ SOLN
INTRAMUSCULAR | Status: AC
  Filled 2019-06-19: qty 5

## 2019-06-19 MED ORDER — LACTATED RINGERS IV SOLN: INTRAVENOUS | Status: DC

## 2019-06-19 MED ORDER — POTASSIUM CHLORIDE IN NACL 20-0.9 MEQ/L-% IV SOLN
INTRAVENOUS | Status: DC
  Filled 2019-06-19 (×2): qty 1000

## 2019-06-19 MED ORDER — PROPOFOL 10 MG/ML IV BOLUS
INTRAVENOUS | Status: AC
  Filled 2019-06-19: qty 20

## 2019-06-19 MED ORDER — ONDANSETRON HCL 4 MG/2ML IJ SOLN
INTRAMUSCULAR | Status: AC
  Filled 2019-06-19: qty 2

## 2019-06-19 MED ORDER — BUPIVACAINE HCL (PF) 0.5 % IJ SOLN
INTRAMUSCULAR | Status: DC | PRN
  Administered 2019-06-19: 20 mL

## 2019-06-19 MED ORDER — OXYCODONE HCL 5 MG PO TABS: 5.0000 mg | ORAL_TABLET | ORAL | Status: DC | PRN

## 2019-06-19 MED ORDER — FERROUS GLUCONATE 324 (38 FE) MG PO TABS
324.0000 mg | ORAL_TABLET | Freq: Every day | ORAL | Status: DC
  Administered 2019-06-20: 324 mg via ORAL
  Filled 2019-06-19: qty 1

## 2019-06-19 MED ORDER — ONDANSETRON 4 MG PO TBDP: 4.0000 mg | ORAL_TABLET | Freq: Four times a day (QID) | ORAL | Status: DC | PRN

## 2019-06-19 MED ORDER — DIPHENHYDRAMINE HCL 50 MG/ML IJ SOLN: 12.5000 mg | Freq: Four times a day (QID) | INTRAMUSCULAR | Status: DC | PRN

## 2019-06-19 MED ORDER — ROCURONIUM BROMIDE 100 MG/10ML IV SOLN
INTRAVENOUS | Status: DC | PRN
  Administered 2019-06-19: 80 mg via INTRAVENOUS

## 2019-06-19 MED ORDER — TRAMADOL HCL 50 MG PO TABS
50.0000 mg | ORAL_TABLET | Freq: Four times a day (QID) | ORAL | Status: DC | PRN
  Administered 2019-06-19: 50 mg via ORAL
  Filled 2019-06-19: qty 1

## 2019-06-19 MED ORDER — DEXAMETHASONE SODIUM PHOSPHATE 10 MG/ML IJ SOLN
INTRAMUSCULAR | Status: DC | PRN
  Administered 2019-06-19: 4 mg via INTRAVENOUS

## 2019-06-19 MED ORDER — BISMUTH SUBSALICYLATE 262 MG PO CHEW
2.0000 | CHEWABLE_TABLET | Freq: Four times a day (QID) | ORAL | Status: DC
  Administered 2019-06-19 – 2019-06-20 (×2): 524 mg via ORAL
  Filled 2019-06-19 (×5): qty 2

## 2019-06-19 MED ORDER — ONDANSETRON HCL 4 MG/2ML IJ SOLN: 4.0000 mg | Freq: Once | INTRAMUSCULAR | Status: DC | PRN

## 2019-06-19 MED ORDER — BUPIVACAINE HCL (PF) 0.5 % IJ SOLN
INTRAMUSCULAR | Status: AC
  Filled 2019-06-19: qty 30

## 2019-06-19 MED ORDER — DIPHENHYDRAMINE HCL 12.5 MG/5ML PO ELIX: 12.5000 mg | ORAL_SOLUTION | Freq: Four times a day (QID) | ORAL | Status: DC | PRN

## 2019-06-19 MED ORDER — HYDROMORPHONE HCL 1 MG/ML IJ SOLN: 0.2500 mg | INTRAMUSCULAR | Status: DC | PRN

## 2019-06-19 MED ORDER — ONDANSETRON HCL 4 MG/2ML IJ SOLN
INTRAMUSCULAR | Status: DC | PRN
  Administered 2019-06-19: 4 mg via INTRAVENOUS

## 2019-06-19 MED ORDER — CHLORHEXIDINE GLUCONATE CLOTH 2 % EX PADS: 6.0000 | MEDICATED_PAD | Freq: Once | CUTANEOUS | Status: DC

## 2019-06-19 MED ORDER — SUGAMMADEX SODIUM 500 MG/5ML IV SOLN
INTRAVENOUS | Status: AC
  Filled 2019-06-19: qty 5

## 2019-06-19 MED ORDER — ALLOPURINOL 300 MG PO TABS
300.0000 mg | ORAL_TABLET | Freq: Every day | ORAL | Status: DC
  Administered 2019-06-20: 08:00:00 300 mg via ORAL
  Filled 2019-06-19: qty 1

## 2019-06-19 MED ORDER — GABAPENTIN 300 MG PO CAPS
300.0000 mg | ORAL_CAPSULE | ORAL | Status: AC
  Administered 2019-06-19: 300 mg via ORAL
  Filled 2019-06-19: qty 1

## 2019-06-19 MED ORDER — ENOXAPARIN SODIUM 40 MG/0.4ML ~~LOC~~ SOLN: 40.0000 mg | SUBCUTANEOUS | Status: DC

## 2019-06-19 SURGICAL SUPPLY — 32 items
ADH SKN CLS APL DERMABOND .7 (GAUZE/BANDAGES/DRESSINGS) ×1
APL PRP STRL LF DISP 70% ISPRP (MISCELLANEOUS) ×1
APPLIER CLIP 5 13 M/L LIGAMAX5 (MISCELLANEOUS) ×3
APR CLP MED LRG 5 ANG JAW (MISCELLANEOUS) ×1
BAG SPEC RTRVL LRG 6X4 10 (ENDOMECHANICALS) ×1
CABLE HIGH FREQUENCY MONO STRZ (ELECTRODE) ×3 IMPLANT
CHLORAPREP W/TINT 26 (MISCELLANEOUS) ×3 IMPLANT
CLIP APPLIE 5 13 M/L LIGAMAX5 (MISCELLANEOUS) ×1 IMPLANT
COVER MAYO STAND STRL (DRAPES) IMPLANT
COVER WAND RF STERILE (DRAPES) ×2 IMPLANT
DECANTER SPIKE VIAL GLASS SM (MISCELLANEOUS) ×3 IMPLANT
DERMABOND ADVANCED (GAUZE/BANDAGES/DRESSINGS) ×2
DERMABOND ADVANCED .7 DNX12 (GAUZE/BANDAGES/DRESSINGS) ×1 IMPLANT
DRAPE C-ARM 42X120 X-RAY (DRAPES) IMPLANT
ELECT REM PT RETURN 15FT ADLT (MISCELLANEOUS) ×3 IMPLANT
GOWN STRL REUS W/TWL XL LVL3 (GOWN DISPOSABLE) ×6 IMPLANT
HEMOSTAT SURGICEL 4X8 (HEMOSTASIS) IMPLANT
KIT BASIN OR (CUSTOM PROCEDURE TRAY) ×3 IMPLANT
KIT TURNOVER KIT A (KITS) IMPLANT
PENCIL SMOKE EVACUATOR (MISCELLANEOUS) IMPLANT
POUCH SPECIMEN RETRIEVAL 10MM (ENDOMECHANICALS) ×3 IMPLANT
SCISSORS LAP 5X35 DISP (ENDOMECHANICALS) ×3 IMPLANT
SET CHOLANGIOGRAPH MIX (MISCELLANEOUS) IMPLANT
SET IRRIG TUBING LAPAROSCOPIC (IRRIGATION / IRRIGATOR) ×3 IMPLANT
SET TUBE SMOKE EVAC HIGH FLOW (TUBING) ×3 IMPLANT
SLEEVE XCEL OPT CAN 5 100 (ENDOMECHANICALS) ×6 IMPLANT
SUT MNCRL AB 4-0 PS2 18 (SUTURE) ×3 IMPLANT
SUT VIC AB 2-0 UR6 27 (SUTURE) ×2 IMPLANT
TOWEL OR 17X26 10 PK STRL BLUE (TOWEL DISPOSABLE) ×3 IMPLANT
TOWEL OR NON WOVEN STRL DISP B (DISPOSABLE) ×3 IMPLANT
TRAY LAPAROSCOPIC (CUSTOM PROCEDURE TRAY) ×3 IMPLANT
TROCAR BLADELESS OPT 5 100 (ENDOMECHANICALS) ×3 IMPLANT

## 2019-06-19 NOTE — Anesthesia Postprocedure Evaluation (Signed)
Anesthesia Post Note  Patient: Deanna Burke  Procedure(s) Performed: LAPAROSCOPIC CHOLECYSTECTOMY (N/A )     Patient location during evaluation: PACU Anesthesia Type: General Level of consciousness: awake and alert Pain management: pain level controlled Vital Signs Assessment: post-procedure vital signs reviewed and stable Respiratory status: spontaneous breathing, nonlabored ventilation, respiratory function stable and patient connected to nasal cannula oxygen Cardiovascular status: blood pressure returned to baseline and stable Postop Assessment: no apparent nausea or vomiting Anesthetic complications: no    Last Vitals:  Vitals:   06/19/19 1715 06/19/19 1738  BP: 133/79 120/76  Pulse: 88 88  Resp: 16 20  Temp:  36.6 C  SpO2: 95% 99%    Last Pain:  Vitals:   06/19/19 1738  TempSrc: Oral  PainSc:                  Marletta Bousquet DAVID

## 2019-06-19 NOTE — Anesthesia Procedure Notes (Signed)
Procedure Name: Intubation Date/Time: 06/19/2019 3:17 PM Performed by: Glory Buff, CRNA Pre-anesthesia Checklist: Patient identified, Emergency Drugs available, Suction available and Patient being monitored Patient Re-evaluated:Patient Re-evaluated prior to induction Oxygen Delivery Method: Circle system utilized Preoxygenation: Pre-oxygenation with 100% oxygen Induction Type: IV induction Ventilation: Mask ventilation without difficulty Laryngoscope Size: Miller and 3 Grade View: Grade I Tube type: Oral Tube size: 7.0 mm Number of attempts: 1 Airway Equipment and Method: Stylet and Oral airway Placement Confirmation: ETT inserted through vocal cords under direct vision,  positive ETCO2 and breath sounds checked- equal and bilateral Secured at: 21 cm Tube secured with: Tape Dental Injury: Teeth and Oropharynx as per pre-operative assessment

## 2019-06-19 NOTE — Interval H&P Note (Signed)
History and Physical Interval Note: no change in H and P  06/19/2019 2:50 PM  Deanna Burke  has presented today for surgery, with the diagnosis of SYMPTOMATIC GALLSTONES.  The various methods of treatment have been discussed with the patient and family. After consideration of risks, benefits and other options for treatment, the patient has consented to  Procedure(s): LAPAROSCOPIC, POSSIBLE OPEN CHOLECYSTECTOMY (N/A) as a surgical intervention.  The patient's history has been reviewed, patient examined, no change in status, stable for surgery.  I have reviewed the patient's chart and labs.  Questions were answered to the patient's satisfaction.     Coralie Keens

## 2019-06-19 NOTE — Op Note (Signed)
LAPAROSCOPIC CHOLECYSTECTOMY  Procedure Note  Deanna Burke 06/19/2019   Pre-op Diagnosis: SYMPTOMATIC GALLSTONES     Post-op Diagnosis: same  Procedure(s): LAPAROSCOPIC CHOLECYSTECTOMY  Surgeon(s): Coralie Keens, MD  Anesthesia: General  Staff:  Circulator: Joesphine Bare, RN Scrub Person: Angelene Giovanni, Koren Shiver, RN  Estimated Blood Loss: Minimal               Specimens: sent to path  Indications: This is a 69 year old female with a significant past medical history of having had multiple abdominal surgical procedures.  These include a subtotal colectomy and ileostomy.  She has had multiple admissions and surgeries for bowel obstruction in the past.  She now presents with symptomatic cholelithiasis.  Findings: The patient was found to have a distended gallbladder.  She had a large amount of adhesions throughout the entire abdomen with small bowel visibly fixated in multiple areas of the abdominal wall except in the right upper quadrant.  Procedure: The patient was brought to the operating room and identifies correct patient.  She is placed upon the operating table general anesthesia was induced.  We removed her ostomy appliance and placed a Tegaderm over this.  Her abdomen was then prepped and draped in usual sterile fashion.  I made a small incision the patient's left upper quadrant with a scalpel.  I then used the 5 mm trocar and Optiview camera to slowly traversed all layers of the abdominal wall and gain entrance into the abdominal cavity.  Insufflation of the abdomen was begun.  The patient was already seen to have extensive adhesions.  I evaluated the introduction site and saw no evidence of bowel injury.  I could visualize the gallbladder in the right upper quadrant and had a small window to perform a laparoscopic cholecystectomy.  She had extensive small bowel fixated to the abdominal wall at the midline and around the ostomy  site.  I made no attempt to free any of these loops of small bowel.  I was able to place a 5 mm trocar in the patient's epigastrium under direct vision and another in the right upper quadrant under direct vision.  I then placed a 11 mm trocar in the right upper quadrant as well under direct vision.  The gallbladder was grasped and retracted of liver bed.  I dissected out the base of the gallbladder.  I achieved a critical window around the cystic duct and cystic artery and were able to clip these proximally distally and transected them.  The gallbladder was then slowly dissected free from the liver bed with electrocautery.  Once the gallbladder was free from the liver bed, I placed in an Endosac and removed it through the 11 mm trocar site.  I again evaluated the liver bed hemostasis felt to be achieved.  Again, I saw no evidence of bowel injury.  All trochars were removed under direct vision the abdomen was deflated.  I anesthetized all incisions with Marcaine.  I closed the fascia at the 11 mm trocar site with 2-0 Vicryl suture on a UR needle.  All skin incisions were then closed with 4-0 Monocryl sutures and Dermabond.  The patient tolerated the procedure well.  All the counts were correct at the end of the procedure.  The patient was then extubated in the operating room and taken in a stable condition to the recovery room.          Coralie Keens   Date: 06/19/2019  Time: 3:59 PM

## 2019-06-19 NOTE — Transfer of Care (Signed)
Immediate Anesthesia Transfer of Care Note  Patient: Deanna Burke  Procedure(s) Performed: LAPAROSCOPIC CHOLECYSTECTOMY (N/A )  Patient Location: PACU  Anesthesia Type:General  Level of Consciousness: drowsy, patient cooperative and responds to stimulation  Airway & Oxygen Therapy: Patient Spontanous Breathing and Patient connected to face mask oxygen  Post-op Assessment: Report given to RN and Post -op Vital signs reviewed and stable  Post vital signs: Reviewed and stable  Last Vitals:  Vitals Value Taken Time  BP 160/85 06/19/19 1606  Temp    Pulse 84 06/19/19 1608  Resp 17 06/19/19 1608  SpO2 100 % 06/19/19 1608  Vitals shown include unvalidated device data.  Last Pain:  Vitals:   06/19/19 1449  TempSrc: Oral  PainSc:          Complications: No apparent anesthesia complications

## 2019-06-19 NOTE — Anesthesia Preprocedure Evaluation (Addendum)
Anesthesia Evaluation  Patient identified by MRN, date of birth, ID band Patient awake    Reviewed: Allergy & Precautions, NPO status , Patient's Chart, lab work & pertinent test results  Airway Mallampati: II  TM Distance: >3 FB Neck ROM: Full    Dental   Pulmonary sleep apnea ,    Pulmonary exam normal        Cardiovascular hypertension, Pt. on medications Normal cardiovascular exam     Neuro/Psych    GI/Hepatic   Endo/Other    Renal/GU      Musculoskeletal   Abdominal   Peds  Hematology   Anesthesia Other Findings   Reproductive/Obstetrics                             Anesthesia Physical Anesthesia Plan  ASA: III  Anesthesia Plan: General   Post-op Pain Management:    Induction: Intravenous  PONV Risk Score and Plan: 3 and Ondansetron, Midazolam and Dexamethasone  Airway Management Planned: Oral ETT  Additional Equipment:   Intra-op Plan:   Post-operative Plan: Extubation in OR  Informed Consent: I have reviewed the patients History and Physical, chart, labs and discussed the procedure including the risks, benefits and alternatives for the proposed anesthesia with the patient or authorized representative who has indicated his/her understanding and acceptance.       Plan Discussed with: CRNA and Surgeon  Anesthesia Plan Comments:         Anesthesia Quick Evaluation

## 2019-06-19 NOTE — Progress Notes (Signed)
Pt declined cpap for tonight

## 2019-06-20 DIAGNOSIS — K811 Chronic cholecystitis: Secondary | ICD-10-CM | POA: Diagnosis not present

## 2019-06-20 NOTE — Progress Notes (Signed)
Patient ID: Deanna Burke, female   DOB: 1950-07-04, 69 y.o.   MRN: [id number]   Doing well No complaints Minimal pain Abdomen soft, minimally tender  Plan: Discharge home

## 2019-06-20 NOTE — Discharge Instructions (Signed)
[facility] ______[facility] LAPAROSCOPIC SURGERY: POST OP INSTRUCTIONS Always review your discharge instruction sheet given to you by the facility where your surgery was performed. IF YOU HAVE DISABILITY OR FAMILY LEAVE FORMS, YOU MUST BRING THEM TO THE OFFICE FOR PROCESSING.   DO NOT GIVE THEM TO YOUR DOCTOR.  1. A prescription for pain medication may be given to you upon discharge.  Take your pain medication as prescribed, if needed.  If narcotic pain medicine is not needed, then you may take acetaminophen (Tylenol) or ibuprofen (Advil) as needed. 2. Take your usually prescribed medications unless otherwise directed. 3. If you need a refill on your pain medication, please contact your pharmacy.  They will contact our office to request authorization. Prescriptions will not be filled after 5pm or on week-ends. 4. You should follow a light diet the first few days after arrival home, such as soup and crackers, etc.  Be sure to include lots of fluids daily. 5. Most patients will experience some swelling and bruising in the area of the incisions.  Ice packs will help.  Swelling and bruising can take several days to resolve.  6. It is common to experience some constipation if taking pain medication after surgery.  Increasing fluid intake and taking a stool softener (such as Colace) will usually help or prevent this problem from occurring.  A mild laxative (Milk of Magnesia or Miralax) should be taken according to package instructions if there are no bowel movements after 48 hours. 7. Unless discharge instructions indicate otherwise, you may remove your bandages 24-48 hours after surgery, and you may shower at that time.  You may have steri-strips (small skin tapes) in place directly over the incision.  These strips should be left on the skin for 7-10 days.  If your surgeon used skin glue on the incision, you may shower in 24 hours.  The glue will flake off over the next 2-3 weeks.  Any sutures or  staples will be removed at the office during your follow-up visit. 8. ACTIVITIES:  You may resume regular (light) daily activities beginning the next day--such as daily self-care, walking, climbing stairs--gradually increasing activities as tolerated.  You may have sexual intercourse when it is comfortable.  Refrain from any heavy lifting or straining until approved by your doctor. a. You may drive when you are no longer taking prescription pain medication, you can comfortably wear a seatbelt, and you can safely maneuver your car and apply brakes. b. RETURN TO WORK:  __________________________________________________________ 9. You should see your doctor in the office for a follow-up appointment approximately 2-3 weeks after your surgery.  Make sure that you call for this appointment within a day or two after you arrive home to insure a convenient appointment time. 10. OTHER INSTRUCTIONS:ok to shower today __________________________________________________________________________________________________________________________ __________________________________________________________________________________________________________________________ WHEN TO CALL YOUR DOCTOR: 1. Fever over 101.0 2. Inability to urinate 3. Continued bleeding from incision. 4. Increased pain, redness, or drainage from the incision. 5. Increasing abdominal pain  The clinic staff is available to answer your questions during regular business hours.  Please don't hesitate to call and ask to speak to one of the nurses for clinical concerns.  If you have a medical emergency, go to the nearest emergency room or call 911.  A surgeon from [facility] is always on call at the hospital. [address] [place], [place], [place]  [address] ? [address], [place], [place]   [address] [phone] ? [phone] ? [phone] Web site: [email or web address]

## 2019-06-20 NOTE — Discharge Summary (Signed)
Physician Discharge Summary  Patient ID: Deanna Burke MRN: [id number] DOB/AGE: 1951/01/31 69 y.o.  Admit date: 06/19/2019 Discharge date: 06/20/2019  Admission Diagnoses:  Discharge Diagnoses:  Active Problems:   Symptomatic cholelithiasis   Discharged Condition: good  Hospital Course: uneventful post op recovery.  Discharged home POD#1  Consults: None  Significant Diagnostic Studies:   Treatments: surgery: lap chole  Discharge Exam: Blood pressure (!) 140/57, pulse 91, temperature 98.3 F (36.8 C), temperature source Oral, resp. rate 18, height 5' 4"  (1.626 m), weight 80.9 kg, SpO2 97 %. General appearance: alert, cooperative and no distress Resp: clear to auscultation bilaterally Cardio: regular rate and rhythm, S1, S2 normal, no murmur, click, rub or gallop Incision/Wound:abdomen soft, incisions clean  Disposition: Discharge disposition: 01-Home or Self Care        Allergies as of 06/20/2019      Reactions   Bactrim [sulfamethoxazole-trimethoprim] Nausea And Vomiting   Demerol Nausea And Vomiting   Dilaudid  [hydromorphone Hcl]    Dilaudid [hydromorphone Hcl] Nausea And Vomiting, Other (See Comments)   Muscle cramping   Reglan [metoclopramide] Other (See Comments)   Cramps in hands   Stadol [butorphanol Tartrate] Nausea And Vomiting   Injection   Cephalexin Rash   Penicillins Rash   Rash only; note also rash to Keflex Has patient had a PCN reaction causing immediate rash, facial/tongue/throat swelling, SOB or lightheadedness with hypotension: rash Has patient had a PCN reaction causing severe rash involving mucus membranes or skin necrosis: no Has patient had a PCN reaction that required hospitalization no Has patient had a PCN reaction occurring within the last 10 years: no If all of the above answers are "NO", then may proceed with Cephalosporin use.      Medication List    TAKE these medications   acetaminophen 500 MG tablet Commonly known  as: TYLENOL Take 1,000 mg by mouth every 6 (six) hours as needed for moderate pain.   allopurinol 300 MG tablet Commonly known as: ZYLOPRIM Take 300 mg by mouth daily.   aspirin EC 81 MG tablet Take 81 mg by mouth daily.   Bismuth Subgallate 200 MG Chew Chew 400 mg by mouth in the morning, at noon, in the evening, and at bedtime.   colchicine 0.6 MG tablet Take 1 tablet (0.6 mg total) by mouth daily. What changed:   when to take this  reasons to take this   doxycycline 100 MG capsule Commonly known as: VIBRAMYCIN Take 100 mg by mouth 2 (two) times daily.   ergocalciferol 1.25 MG (50000 UT) capsule Commonly known as: VITAMIN D2 Take 50,000 Units by mouth once a week.   ferrous gluconate 324 MG tablet Commonly known as: FERGON Take 324 mg by mouth daily with breakfast.   furosemide 20 MG tablet Commonly known as: LASIX Take 20 mg by mouth daily.   Myorisan 40 MG capsule Generic drug: ISOtretinoin Take 40 mg by mouth once a week.   nitrofurantoin 100 MG capsule Commonly known as: MACRODANTIN Take 100 mg by mouth daily.   potassium chloride SA 20 MEQ tablet Commonly known as: KLOR-CON Take 1 tablet (20 mEq total) by mouth daily.   PROBIOTIC DAILY PO Take 1 capsule by mouth daily.   Turmeric 500 MG Tabs Take 500 mg by mouth daily.      Follow-up Information    Coralie Keens, MD. Schedule an appointment as soon as possible for a visit in 3 week(s).   Specialty: General Surgery Contact information: [address] [place] [address] [phone]           Signed: Coralie Keens 06/20/2019, 7:28 AM

## 2019-06-20 NOTE — Plan of Care (Signed)
  Problem: Health Behavior/Discharge Planning: Goal: Ability to manage health-related needs will improve Outcome: Progressing   Problem: Clinical Measurements: Goal: Ability to maintain clinical measurements within normal limits will improve Outcome: Progressing Goal: Will remain free from infection Outcome: Progressing Goal: Diagnostic test results will improve Outcome: Progressing Goal: Respiratory complications will improve Outcome: Progressing Goal: Cardiovascular complication will be avoided Outcome: Progressing   Problem: Activity: Goal: Risk for activity intolerance will decrease Outcome: Progressing   Problem: Nutrition: Goal: Adequate nutrition will be maintained Outcome: Progressing   Problem: Coping: Goal: Level of anxiety will decrease Outcome: Progressing   Problem: Elimination: Goal: Will not experience complications related to bowel motility Outcome: Progressing Goal: Will not experience complications related to urinary retention Outcome: Progressing   Problem: Pain Managment: Goal: General experience of comfort will improve Outcome: Progressing   Problem: Safety: Goal: Ability to remain free from injury will improve Outcome: Progressing   Problem: Skin Integrity: Goal: Risk for impaired skin integrity will decrease Outcome: Progressing   Problem: Education: Goal: Required Educational Video(s) Outcome: Progressing   Problem: Clinical Measurements: Goal: Postoperative complications will be avoided or minimized Outcome: Progressing   Problem: Skin Integrity: Goal: Demonstration of wound healing without infection will improve Outcome: Progressing

## 2019-06-23 LAB — SURGICAL PATHOLOGY

## 2019-07-19 ENCOUNTER — Encounter (HOSPITAL_BASED_OUTPATIENT_CLINIC_OR_DEPARTMENT_OTHER): Payer: Self-pay | Admitting: Emergency Medicine

## 2019-07-19 ENCOUNTER — Emergency Department (HOSPITAL_BASED_OUTPATIENT_CLINIC_OR_DEPARTMENT_OTHER)
Admission: EM | Admit: 2019-07-19 | Discharge: 2019-07-19 | Disposition: A | Discharge status: Home / Self Care | Payer: Medicare Other | Attending: Emergency Medicine | Admitting: Emergency Medicine

## 2019-07-19 ENCOUNTER — Other Ambulatory Visit: Payer: Self-pay

## 2019-07-19 DIAGNOSIS — Z7982 Long term (current) use of aspirin: Secondary | ICD-10-CM | POA: Insufficient documentation

## 2019-07-19 DIAGNOSIS — I10 Essential (primary) hypertension: Secondary | ICD-10-CM | POA: Insufficient documentation

## 2019-07-19 DIAGNOSIS — Z905 Acquired absence of kidney: Secondary | ICD-10-CM | POA: Insufficient documentation

## 2019-07-19 DIAGNOSIS — N3000 Acute cystitis without hematuria: Secondary | ICD-10-CM | POA: Diagnosis not present

## 2019-07-19 DIAGNOSIS — Z96653 Presence of artificial knee joint, bilateral: Secondary | ICD-10-CM | POA: Insufficient documentation

## 2019-07-19 DIAGNOSIS — Z853 Personal history of malignant neoplasm of breast: Secondary | ICD-10-CM | POA: Insufficient documentation

## 2019-07-19 DIAGNOSIS — R309 Painful micturition, unspecified: Secondary | ICD-10-CM | POA: Diagnosis present

## 2019-07-19 DIAGNOSIS — Z79899 Other long term (current) drug therapy: Secondary | ICD-10-CM | POA: Insufficient documentation

## 2019-07-19 LAB — URINALYSIS, MICROSCOPIC (REFLEX): WBC, UA: >50 WBC/hpf (ref 0–5)

## 2019-07-19 LAB — URINALYSIS, ROUTINE W REFLEX MICROSCOPIC
Bilirubin Urine: NEGATIVE
Glucose, UA: NEGATIVE mg/dL
Ketones, ur: NEGATIVE mg/dL
Nitrite: POSITIVE — AB
Protein, ur: NEGATIVE mg/dL
Specific Gravity, Urine: 1.02 (ref 1.005–1.030)
pH: 5.5 (ref 5.0–8.0)

## 2019-07-19 MED ORDER — CIPROFLOXACIN HCL 500 MG PO TABS: 500.0000 mg | ORAL_TABLET | Freq: Two times a day (BID) | ORAL | 0 refills | Status: DC

## 2019-07-19 MED ORDER — CIPROFLOXACIN HCL 500 MG PO TABS
500.0000 mg | ORAL_TABLET | Freq: Once | ORAL | Status: AC
  Administered 2019-07-19: 21:00:00 500 mg via ORAL
  Filled 2019-07-19: qty 1

## 2019-07-19 NOTE — ED Provider Notes (Signed)
[facility] Provider Note   CSN: [id number] Arrival date & time: 07/19/19  1909     History Chief Complaint  Patient presents with  . Dysuria    Deanna Burke is a 69 y.o. female.  Patient is a 69 year old female who presents with symptoms of a UTI.  She states that she has had recurrent UTIs for the last several years.  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  She was started on daily Macrobid about a year or so ago and had been doing well with less evidence of UTIs until this year when she started having recurrence of her frequent UTIs.  She was treated recently with a 3-day course of Cipro for UTI and finished it 1 week ago.  She started having symptoms again yesterday.  She is having burning on urination with urinary frequency and some vague low back pain.  These are the same symptoms that she has had in the past with UTIs.  She denies any abdominal pain.  No fevers.  No vomiting.  [facility] is followed by [facility].        Past Medical History:  Diagnosis Date  . Abnormal finding on cardiovascular stress test, ischemia anterolateral 02/13/2012   follow heart cath - pt told one tiny blockage that didn't even matter  . Anemia 1/2/-21  . Arthritis    hands, lumbar spine, hips, right ankle  . Breast cancer ([place])   . 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 Cataract    Left Eye  . Complication of anesthesia 1996   pt has ileostomy and had surgery in 1996 that paralyzed  . Fatigue   . H/O nephrostomy 08/13/12  . H/O ulcerative colitis   . History of bone density study 2012  . History of kidney stones 2015   nephrectomy  . History of recurrent UTIs 02/13/2012  . Hot flashes   . Hypertension   . Ileostomy in place, secondary to ulcerative colitis x 20 years 02/13/2012  . 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 Large bowel perforation ([place]) 1971/1996  . Personal history of radiation  therapy   . Phlebitis 1971  . Pulmonary embolus ([place]) 1971  . S/P ileostomy ([place])   . S/P radiation therapy 07/24/11 - 09/06/11   Left Breast/ 5000 cGy in 25 Fractions with Boost of 1000 cGy in 5 Fractions  . Scoliosis   . Sleep apnea 2009   uses cpap-setting is 1  . Small bowel obstruction ([place]) [id number]  . Small bowel perforation ([place]) 1971/1996  . Syncope, ? anginal equivilant 02/13/2012  . Torn rotator cuff 2005  . Ulcerative colitis [phone]    Patient Active Problem List   Diagnosis Date Noted  . Symptomatic cholelithiasis 06/19/2019  . Small bowel obstruction ([place]) 12/30/2017  . History of chronic kidney disease 10/08/2017  . History of kidney stones 10/08/2017  . Traumatic complete tear of right rotator cuff 09/19/2017  . History of osteoporosis 09/19/2017  . Vitamin D deficiency 09/19/2017  . Hypercholesterolemia 09/19/2017  . OSA on CPAP 09/19/2017  . Other insomnia 09/19/2017  . Recurrent UTI 09/19/2017  . Primary osteoarthritis of both hands 09/19/2017  . S/P TKR (total knee replacement), bilateral 09/19/2017  . Ulcerative colitis ([place]) 05/10/2015  . Ileostomy present ([place]) 05/10/2015  . H/O unilateral nephrectomy 05/10/2015  . Essential hypertension, benign 04/07/2015  . History of pulmonary embolism   .  SBO (small bowel obstruction) ([place]) 12/30/2013  . Malignant neoplasm of upper-outer quadrant of left breast in female, estrogen receptor positive ([place]) 04/07/2013  . Neoplasm of left breast, primary tumor staging category Tis: ductal carcinoma in situ (DCIS) 01/27/2013  . Pyelonephritis, acute 06/05/2012  . Hydronephrosis of right kidney 06/05/2012  . Hyponatremia 06/05/2012  . Hypokalemia 06/05/2012  . Acute kidney injury ([place]) 06/05/2012  . 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 Abnormal finding on cardiovascular stress test, ischemia anterolateral 02/13/2012  . Syncope, ? anginal equivilant 02/13/2012  .  Ileostomy in place, secondary to ulcerative colitis x 20 years 02/13/2012  . History of recurrent UTIs 02/13/2012    Past Surgical History:  Procedure Laterality Date  . ABDOMINAL HYSTERECTOMY  1996   partial  . APPENDECTOMY    . BREAST BIOPSY    . 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  ER:+, PR+,   . BREAST SURGERY  05/03/11   LEFT BREAST NEEDLE CORE BIOPSY- DCIS  . CARDIAC CATHETERIZATION    . CATARACT EXTRACTION Left   . CHOLECYSTECTOMY N/A 06/19/2019   Procedure: LAPAROSCOPIC CHOLECYSTECTOMY;  Surgeon: Coralie Keens, MD;  Location: [facility];  Service: General;  Laterality: N/A;  . COLECTOMY  1973  . EXPLORATORY LAPAROTOMY  1978/1990   with lysis of adhesions  . exploratory laps     several due to abd pain related to ulcerative colitis  . EYE SURGERY  05/08/2011   left cataract removal   . ILEOSTOMY  1973  . IR GENERIC HISTORICAL  03/08/2016   IR VENOCAVAGRAM IVC 03/08/2016 Arne Cleveland, MD [facility]  . IR GENERIC HISTORICAL  03/08/2016   IR US GUIDE VASC ACCESS RIGHT 03/08/2016 Arne Cleveland, MD [facility]  . JOINT REPLACEMENT    . KNEE ARTHROPLASTY    . LAPAROSCOPIC NEPHRECTOMY Right 08/14/2012   Procedure: RIGHT LAPAROSCOPIC RETROPERITONEAL LAPAROSCOPIC NEPHRECTOMY ;  Surgeon: Alexis Frock, MD;  Location: [facility];  Service: Urology;  Laterality: Right;  RIGHT LAPAROSCOPIC RETROPERITONEAL LAPAROSCOPIC NEPHRECTOMY, POSSIBLE OPEN   . LEFT HEART CATHETERIZATION WITH CORONARY ANGIOGRAM N/A 02/13/2012   Procedure: LEFT HEART CATHETERIZATION WITH CORONARY ANGIOGRAM;  Surgeon: Lorretta Harp, MD;  Location: [facility] LAB;  Service: Cardiovascular;  Laterality: N/A;  . ROTATOR CUFF REPAIR  2006   Right  . TOTAL KNEE ARTHROPLASTY Left 04/01/2015   Procedure: LEFT TOTAL KNEE ARTHROPLASTY;  Surgeon: Susa Day, MD;  Location: [facility];  Service: Orthopedics;  Laterality:  Left;  . TOTAL KNEE ARTHROPLASTY Right 11/04/2015   Procedure: RIGHT TOTAL KNEE ARTHROPLASTY;  Surgeon: Susa Day, MD;  Location: [facility];  Service: Orthopedics;  Laterality: Right;  . TOTAL SHOULDER ARTHROPLASTY    . VAGINOPLASTY  1975     OB History   No obstetric history on file.     Family History  Problem Relation Age of Onset  . Cancer Other        Breast  . Atrial fibrillation Mother   . Heart attack Father   . Heart attack Paternal Uncle   . Breast cancer Maternal Grandmother   . Stomach cancer Maternal Grandfather   . Cancer Paternal Grandmother        stomach  . Atrial fibrillation Brother     Social History   Tobacco Use  . Smoking status: Never Smoker  . Smokeless tobacco: Never Used  Substance  Use Topics  . Alcohol use: No  . Drug use: No    Home Medications Prior to Admission medications   Medication Sig Start Date End Date Taking? Authorizing Provider  acetaminophen (TYLENOL) 500 MG tablet Take 1,000 mg by mouth every 6 (six) hours as needed for moderate pain.    [provider]  allopurinol (ZYLOPRIM) 300 MG tablet Take 300 mg by mouth daily.  08/14/17   [provider]  aspirin EC 81 MG tablet Take 81 mg by mouth daily.     [provider]  Bismuth Subgallate 200 MG CHEW Chew 400 mg by mouth in the morning, at noon, in the evening, and at bedtime.     [provider]  ciprofloxacin (CIPRO) 500 MG tablet Take 1 tablet (500 mg total) by mouth 2 (two) times daily. One po bid x 7 days 07/19/19   Malvin Johns, MD  colchicine 0.6 MG tablet Take 1 tablet (0.6 mg total) by mouth daily. Patient taking differently: Take 0.6 mg by mouth daily as needed (gout).  01/17/18   Ofilia Neas, PA-C  doxycycline (VIBRAMYCIN) 100 MG capsule Take 100 mg by mouth 2 (two) times daily.    [provider]  ergocalciferol (VITAMIN D2) 1.25 MG (50000 UT) capsule Take 50,000 Units by mouth once a week.    [provider]    ferrous gluconate (FERGON) 324 MG tablet Take 324 mg by mouth daily with breakfast.    [provider]  furosemide (LASIX) 20 MG tablet Take 20 mg by mouth daily.  08/14/17   [provider]  MYORISAN 40 MG capsule Take 40 mg by mouth once a week.  05/31/17   [provider]  nitrofurantoin (MACRODANTIN) 100 MG capsule Take 100 mg by mouth daily.     [provider]  potassium chloride SA (K-DUR,KLOR-CON) 20 MEQ tablet Take 1 tablet (20 mEq total) by mouth daily. 06/23/14   Magrinat, Virgie Dad, MD  Probiotic Product (PROBIOTIC DAILY PO) Take 1 capsule by mouth daily.     [provider]  Turmeric 500 MG TABS Take 500 mg by mouth daily.     [provider]    Allergies    Bactrim [sulfamethoxazole-trimethoprim], Demerol, Dilaudid  [hydromorphone hcl], Dilaudid [hydromorphone hcl], Reglan [metoclopramide], Stadol [butorphanol tartrate], Cephalexin, and Penicillins  Review of Systems   Review of Systems  Constitutional: Negative for chills, diaphoresis, fatigue and fever.  HENT: Negative for congestion, rhinorrhea and sneezing.   Eyes: Negative.   Respiratory: Negative for cough, chest tightness and shortness of breath.   Cardiovascular: Negative for chest pain and leg swelling.  Gastrointestinal: Negative for abdominal pain, blood in stool, diarrhea, nausea and vomiting.  Genitourinary: Positive for dysuria and frequency. Negative for difficulty urinating, flank pain and hematuria.  Musculoskeletal: Positive for back pain. Negative for arthralgias.  Skin: Negative for rash.  Neurological: Negative for dizziness, speech difficulty, weakness, numbness and headaches.    Physical Exam Updated Vital Signs BP (!) 149/90 (BP Location: Right Arm)   Pulse 89   Temp 99.7 F (37.6 C) (Oral)   Resp 20   Ht 5' 4"  (1.626 m)   Wt 79.4 kg   SpO2 96%   BMI 30.04 kg/m   Physical Exam Constitutional:      Appearance: She is well-developed.  HENT:      Head: Normocephalic and atraumatic.  Eyes:     Pupils: Pupils are equal, round, and reactive to light.  Cardiovascular:  Rate and Rhythm: Normal rate and regular rhythm.     Heart sounds: Normal heart sounds.  Pulmonary:     Effort: Pulmonary effort is normal. No respiratory distress.     Breath sounds: Normal breath sounds. No wheezing or rales.  Chest:     Chest wall: No tenderness.  Abdominal:     General: Bowel sounds are normal.     Palpations: Abdomen is soft.     Tenderness: There is no abdominal tenderness. There is no guarding or rebound.  Musculoskeletal:        General: Normal range of motion.     Cervical back: Normal range of motion and neck supple.  Lymphadenopathy:     Cervical: No cervical adenopathy.  Skin:    General: Skin is warm and dry.     Findings: No rash.  Neurological:     Mental Status: She is alert and oriented to person, place, and time.     ED Results / Procedures / Treatments   Labs (all labs ordered are listed, but only abnormal results are displayed) Labs Reviewed  URINALYSIS, ROUTINE W REFLEX MICROSCOPIC - Abnormal; Notable for the following components:      Result Value   APPearance CLOUDY (*)    Hgb urine dipstick TRACE (*)    Nitrite POSITIVE (*)    Leukocytes,Ua MODERATE (*)    All other components within normal limits  URINALYSIS, MICROSCOPIC (REFLEX) - Abnormal; Notable for the following components:   Bacteria, UA MANY (*)    All other components within normal limits  URINE CULTURE    EKG None  Radiology No results found.  Procedures Procedures (including critical care time)  Medications Ordered in ED Medications  ciprofloxacin (CIPRO) tablet 500 mg (500 mg Oral Given 07/19/19 2034)    ED Course  I have reviewed the triage vital signs and the nursing notes.  Pertinent labs & imaging results that were available during my care of the patient were reviewed by me and considered in my medical decision making (see  chart for details).    MDM Rules/Calculators/A&P                      Patient is a 72-year-old female who presents with symptoms of a UTI.  She has no suggestions of sepsis.  No associate abdominal pain.  No fevers.  These are her typical symptoms.  Her urine is consistent with infection.  It was sent for culture.  She was started on a 5-day course of Cipro.  She was encouraged to follow-up with Dr. Matilde Sprang this week.  Return precautions were given. Final Clinical Impression(s) / ED Diagnoses Final diagnoses:  Acute cystitis without hematuria    Rx / DC Orders ED Discharge Orders         Ordered    ciprofloxacin (CIPRO) 500 MG tablet  2 times daily,   Status:  Discontinued     07/19/19 2030    ciprofloxacin (CIPRO) 500 MG tablet  2 times daily     07/19/19 2031           Malvin Johns, MD 07/19/19 2350

## 2019-07-19 NOTE — ED Triage Notes (Signed)
Pt states she just finished tx with Cipro for UTI a few days ago, and today her symptoms returned. States she takes a daily Macrobid 100 mg for urinary issues, also recent tx with Doxycycline. Pt also states she only has 1 remaining kidney. Reports dysuria, cloudy urine, feelings of frequency, and sometimes straining to urinate. Also endorses low back pain "different than the normal morning stiffness" L>R. Denies fever.

## 2019-07-22 LAB — URINE CULTURE: Culture: >=100000 — AB

## 2019-07-23 ENCOUNTER — Telehealth: Payer: Self-pay

## 2019-07-23 NOTE — Telephone Encounter (Signed)
Post ED Visit - Positive Culture Follow-up  Culture report reviewed by antimicrobial stewardship pharmacist: [place] [facility] []  [name], Pharm.D. []  [name], Pharm.D., BCPS AQ-ID []  [name], Pharm.D., BCPS []  [name], Pharm.D., BCPS []  [name], [place].D., BCPS, AAHIVP []  [name], Pharm.D., BCPS, AAHIVP []  [name], PharmD, BCPS []  [name], PharmD, BCPS []  [name], PharmD, BCPS []  [name], PharmD []  [name], PharmD, BCPS []  [name], PharmD Pharm D [place] [facility] []  [name], PharmD []  [name], PharmD []  [name], PharmD []  [name], Rph []  [name]) Glennon Mac, PharmD []  [name], PharmD []  [name], PharmD []  [name], PharmD []  [name], PharmD []  [name], PharmD []  [name], PharmD []  [name], PharmD []  [name], PharmD   Positive urine culture Treated with Cipro, organism sensitive to the same and no further patient follow-up is required at this time.  [name] 07/23/2019, 9:45 AM

## 2020-01-27 ENCOUNTER — Other Ambulatory Visit: Payer: Medicare Other

## 2020-01-27 DIAGNOSIS — Z20822 Contact with and (suspected) exposure to covid-19: Secondary | ICD-10-CM

## 2020-01-29 LAB — NOVEL CORONAVIRUS, NAA: SARS-CoV-2, NAA: NOT DETECTED

## 2020-01-29 LAB — SARS-COV-2, NAA 2 DAY TAT

## 2020-05-04 ENCOUNTER — Other Ambulatory Visit: Payer: Self-pay | Admitting: Oncology

## 2020-05-04 DIAGNOSIS — Z1231 Encounter for screening mammogram for malignant neoplasm of breast: Secondary | ICD-10-CM

## 2020-06-15 ENCOUNTER — Other Ambulatory Visit: Payer: Self-pay

## 2020-06-15 ENCOUNTER — Ambulatory Visit
Admission: RE | Admit: 2020-06-15 | Discharge: 2020-06-15 | Disposition: A | Discharge status: Home / Self Care | Payer: Medicare Other | Source: Ambulatory Visit | Attending: Oncology | Admitting: Oncology

## 2020-06-15 DIAGNOSIS — Z1231 Encounter for screening mammogram for malignant neoplasm of breast: Secondary | ICD-10-CM

## 2021-05-16 ENCOUNTER — Other Ambulatory Visit: Payer: Self-pay | Admitting: Internal Medicine

## 2021-05-16 DIAGNOSIS — Z1231 Encounter for screening mammogram for malignant neoplasm of breast: Secondary | ICD-10-CM

## 2021-06-16 ENCOUNTER — Ambulatory Visit
Admission: RE | Admit: 2021-06-16 | Discharge: 2021-06-16 | Disposition: A | Discharge status: Home / Self Care | Payer: Medicare Other | Source: Ambulatory Visit | Attending: Internal Medicine | Admitting: Internal Medicine

## 2021-06-16 ENCOUNTER — Other Ambulatory Visit: Payer: Self-pay

## 2021-06-16 DIAGNOSIS — Z1231 Encounter for screening mammogram for malignant neoplasm of breast: Secondary | ICD-10-CM

## 2022-05-15 ENCOUNTER — Other Ambulatory Visit: Payer: Self-pay | Admitting: Internal Medicine

## 2022-05-15 DIAGNOSIS — Z1231 Encounter for screening mammogram for malignant neoplasm of breast: Secondary | ICD-10-CM

## 2022-07-04 ENCOUNTER — Ambulatory Visit
Admission: RE | Admit: 2022-07-04 | Discharge: 2022-07-04 | Disposition: A | Discharge status: Home / Self Care | Payer: Medicare Other | Source: Ambulatory Visit | Attending: Internal Medicine | Admitting: Internal Medicine

## 2022-07-04 DIAGNOSIS — Z1231 Encounter for screening mammogram for malignant neoplasm of breast: Secondary | ICD-10-CM

## 2022-10-09 ENCOUNTER — Encounter: Payer: Self-pay | Admitting: Family Medicine

## 2022-10-09 ENCOUNTER — Ambulatory Visit (INDEPENDENT_AMBULATORY_CARE_PROVIDER_SITE_OTHER): Payer: Medicare Other | Admitting: Family Medicine

## 2022-10-09 VITALS — BP 120/74 | HR 84 | Temp 98.0°F | Ht 63.5 in | Wt 180.0 lb

## 2022-10-09 DIAGNOSIS — Z23 Encounter for immunization: Secondary | ICD-10-CM | POA: Diagnosis not present

## 2022-10-09 DIAGNOSIS — E559 Vitamin D deficiency, unspecified: Secondary | ICD-10-CM

## 2022-10-09 DIAGNOSIS — I10 Essential (primary) hypertension: Secondary | ICD-10-CM

## 2022-10-09 DIAGNOSIS — N289 Disorder of kidney and ureter, unspecified: Secondary | ICD-10-CM

## 2022-10-09 DIAGNOSIS — E7800 Pure hypercholesterolemia, unspecified: Secondary | ICD-10-CM | POA: Diagnosis not present

## 2022-10-09 DIAGNOSIS — K51018 Ulcerative (chronic) pancolitis with other complication: Secondary | ICD-10-CM | POA: Diagnosis not present

## 2022-10-09 DIAGNOSIS — E876 Hypokalemia: Secondary | ICD-10-CM

## 2022-10-09 DIAGNOSIS — E871 Hypo-osmolality and hyponatremia: Secondary | ICD-10-CM

## 2022-10-09 DIAGNOSIS — Z932 Ileostomy status: Secondary | ICD-10-CM | POA: Diagnosis not present

## 2022-10-09 DIAGNOSIS — Z905 Acquired absence of kidney: Secondary | ICD-10-CM

## 2022-10-09 DIAGNOSIS — N390 Urinary tract infection, site not specified: Secondary | ICD-10-CM

## 2022-10-09 LAB — CBC
HCT: 41.5 % (ref 36.0–46.0)
Hemoglobin: 13.8 g/dL (ref 12.0–15.0)
MCHC: 33.2 g/dL (ref 30.0–36.0)
MCV: 94.4 fl (ref 78.0–100.0)
Platelets: 270 10*3/uL (ref 150.0–400.0)
RBC: 4.4 Mil/uL (ref 3.87–5.11)
RDW: 14.1 % (ref 11.5–15.5)
WBC: 4.9 10*3/uL (ref 4.0–10.5)

## 2022-10-09 LAB — COMPREHENSIVE METABOLIC PANEL
ALT: 19 U/L (ref 0–35)
AST: 19 U/L (ref 0–37)
Albumin: 4.4 g/dL (ref 3.5–5.2)
Alkaline Phosphatase: 87 U/L (ref 39–117)
BUN: 24 mg/dL — ABNORMAL HIGH (ref 6–23)
CO2: 21 mEq/L (ref 19–32)
Calcium: 9.5 mg/dL (ref 8.4–10.5)
Chloride: 106 mEq/L (ref 96–112)
Creatinine, Ser: 1.23 mg/dL — ABNORMAL HIGH (ref 0.40–1.20)
GFR: 43.52 mL/min — ABNORMAL LOW (ref >60.00)
Glucose, Bld: 95 mg/dL (ref 70–99)
Potassium: 4 mEq/L (ref 3.5–5.1)
Sodium: 138 mEq/L (ref 135–145)
Total Bilirubin: 1.3 mg/dL — ABNORMAL HIGH (ref 0.2–1.2)
Total Protein: 7.6 g/dL (ref 6.0–8.3)

## 2022-10-09 LAB — LDL CHOLESTEROL, DIRECT: Direct LDL: 122 mg/dL

## 2022-10-09 LAB — LIPID PANEL
Cholesterol: 200 mg/dL (ref 0–200)
HDL: 50.4 mg/dL (ref >39.00)
NonHDL: 149.51
Total CHOL/HDL Ratio: 4
Triglycerides: 201 mg/dL — ABNORMAL HIGH (ref 0.0–149.0)
VLDL: 40.2 mg/dL — ABNORMAL HIGH (ref 0.0–40.0)

## 2022-10-09 LAB — VITAMIN D 25 HYDROXY (VIT D DEFICIENCY, FRACTURES): VITD: 23.02 ng/mL — ABNORMAL LOW (ref 30.00–100.00)

## 2022-10-09 NOTE — Assessment & Plan Note (Signed)
Prior low potassium. I will check this. Continue potassium supplement.

## 2022-10-09 NOTE — Assessment & Plan Note (Signed)
I will reassess her renal function today.

## 2022-10-09 NOTE — Assessment & Plan Note (Signed)
I will reassess this today.

## 2022-10-09 NOTE — Assessment & Plan Note (Signed)
Stable. No signs of bowel obstruction.

## 2022-10-09 NOTE — Progress Notes (Signed)
[facility] [address] [place] [address] Dept: [phone] Dept Fax: [phone]  New Patient Office Visit  Subjective:    Patient ID: Enrique Sack, female    DOB: 10-19-1950, 72 y.o..   MRN: [id number]  Chief Complaint  Patient presents with   New Patient (Initial Visit)    Here to establish care, previous pcp [facility]   History of Present Illness:  Patient is in today to establish care. Ms. Klugman was born in [place], [place]. She attended [employer], receiving an Associates degree in business. 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 She was married in 1970, which brought her to [place], but later divorced. She has no children. She denies use of tobacco, alcohol, or drugs.  Ms. Reano has a history of hypertension. She is managed on losartan 25 mg daily and metoprolol 50 mg daily. The metoprolol has also helped her managed some mild sinus tachycardia.  Ms. Laviolette has a history of ulcerative colitis. She underwent multiple surgeries over the years and currently has an ileostomy. She does tend towards watery stools at times and has had low sodium and low potassium levels. She uses bismuth subgallate to control odor.  Ms. Mcelmurray has a history of a prior nephrectomy and CKD of the remaining kidney. She is working to maintain optimal blood pressure control.  Ms. Daun has a history of recurrent UTIs. She is on a regimen of daily trimethoprim and takes a probiotic.  Past Medical History: Patient Active Problem List   Diagnosis Date Noted   Small bowel obstruction (HCC) 12/30/2017   History of chronic kidney disease 10/08/2017   History of kidney stones 10/08/2017   History of osteoporosis 09/19/2017   Vitamin D deficiency 09/19/2017   Hypercholesterolemia 09/19/2017   OSA (obstructive sleep apnea) 09/19/2017   Other  insomnia 09/19/2017   Recurrent UTI 09/19/2017   Primary osteoarthritis of both hands 09/19/2017   S/P TKR (total knee replacement), bilateral 09/19/2017   Ulcerative colitis (HCC) 05/10/2015   H/O unilateral nephrectomy 05/10/2015   Essential hypertension 04/07/2015   History of pulmonary embolism    Malignant neoplasm of upper-outer quadrant of left breast in female, estrogen receptor positive (HCC) 04/07/2013   Neoplasm of left breast, primary tumor staging category Tis: ductal carcinoma in situ (DCIS) 01/27/2013   Pyelonephritis, acute 06/05/2012   Hydronephrosis of right kidney 06/05/2012   Hyponatremia 06/05/2012   Hypokalemia 06/05/2012   Acute kidney injury (HCC) 06/05/2012   Kidney disease, 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 anginal equivilant 02/13/2012   Ileostomy in place, secondary to ulcerative colitis x 20 years 02/13/2012   Past Surgical History:  Procedure Laterality Date   ABDOMINAL HYSTERECTOMY  1996   partial   APPENDECTOMY     BREAST BIOPSY     BREAST LUMPECTOMY  05/31/11   LEFT BREAST LUMPECTOMY, NEGATIVE MARGINS, HIGH GRADE  DUCTAL  CARCINOMA IN SITU WITH ASSOCIATED CALCIFICATIONS.  ER:+, PR+,    BREAST SURGERY  05/03/11   LEFT BREAST NEEDLE CORE BIOPSY- DCIS   CARDIAC CATHETERIZATION     CATARACT EXTRACTION Left    CHOLECYSTECTOMY N/A 06/19/2019   Procedure: LAPAROSCOPIC CHOLECYSTECTOMY;  Surgeon: Abigail Miyamoto, MD;  Location: [facility];  Service: General;  Laterality: N/A;   COLECTOMY  1973   EXPLORATORY  LAPAROTOMY  1978/1990   with lysis of adhesions   exploratory laps     several due to abd pain related to ulcerative colitis   EYE SURGERY  05/08/2011   left cataract removal    ILEOSTOMY  1973   IR GENERIC HISTORICAL  03/08/2016   IR VENOCAVAGRAM IVC 03/08/2016 Oley Balm, MD [facility]   IR GENERIC HISTORICAL   03/08/2016   IR US GUIDE VASC ACCESS RIGHT 03/08/2016 Oley Balm, MD [facility]   JOINT REPLACEMENT     KNEE ARTHROPLASTY     LAPAROSCOPIC NEPHRECTOMY Right 08/14/2012   Procedure: RIGHT LAPAROSCOPIC RETROPERITONEAL LAPAROSCOPIC NEPHRECTOMY ;  Surgeon: Sebastian Ache, MD;  Location: [facility];  Service: Urology;  Laterality: Right;  RIGHT LAPAROSCOPIC RETROPERITONEAL LAPAROSCOPIC NEPHRECTOMY, POSSIBLE OPEN    LEFT HEART CATHETERIZATION WITH CORONARY ANGIOGRAM N/A 02/13/2012   Procedure: LEFT HEART CATHETERIZATION WITH CORONARY ANGIOGRAM;  Surgeon: Runell Gess, MD;  Location: [facility] CATH LAB;  Service: Cardiovascular;  Laterality: N/A;   ROTATOR CUFF REPAIR  2006   Right   TOTAL KNEE ARTHROPLASTY Left 04/01/2015   Procedure: LEFT TOTAL KNEE ARTHROPLASTY;  Surgeon: Jene Every, MD;  Location: [facility];  Service: Orthopedics;  Laterality: Left;   TOTAL KNEE ARTHROPLASTY Right 11/04/2015   Procedure: RIGHT TOTAL KNEE ARTHROPLASTY;  Surgeon: Jene Every, MD;  Location: [facility];  Service: Orthopedics;  Laterality: Right;   TOTAL SHOULDER ARTHROPLASTY     VAGINOPLASTY  1975   Family History  Problem Relation Age of Onset   Atrial fibrillation Mother    Heart disease Father    Heart attack Father    Atrial fibrillation Brother    Heart attack Paternal Uncle    Breast cancer Maternal Grandmother    Stomach cancer Maternal Grandfather    Cancer Paternal Grandmother        stomach   Cancer Other        Breast   Outpatient Medications Prior to Visit  Medication Sig Dispense Refill   acetaminophen (TYLENOL) 500 MG tablet Take 1,000 mg by mouth every 6 (six) hours as needed for moderate pain.     allopurinol (ZYLOPRIM) 300 MG tablet Take 300 mg by mouth daily.   2   aspirin EC 81 MG tablet Take 81 mg by mouth daily.      Bismuth Subgallate 200 MG CHEW Chew 400 mg by mouth in the morning, at noon, in the evening, and at bedtime.      ergocalciferol (VITAMIN D2) 1.25 MG (50000 UT) capsule  Take 50,000 Units by mouth once a week.     furosemide (LASIX) 20 MG tablet Take 20 mg by mouth daily.   2   losartan (COZAAR) 50 MG tablet Take 50 mg by mouth daily.     melatonin 3 MG TABS tablet Take by mouth.     metoprolol succinate (TOPROL-XL) 50 MG 24 hr tablet Take 50 mg by mouth daily.     nitrofurantoin (MACRODANTIN) 100 MG capsule Take 100 mg by mouth daily.      potassium chloride SA (K-DUR,KLOR-CON) 20 MEQ tablet Take 1 tablet (20 mEq total) by mouth daily. 30 tablet 0   Probiotic Product (PROBIOTIC DAILY PO) Take 1 capsule by mouth daily.      trimethoprim (TRIMPEX) 100 MG tablet Take 100 mg by mouth daily.     ciprofloxacin (CIPRO) 500 MG tablet Take 1 tablet (500 mg total) by mouth 2 (two) times daily. One po bid x 7 days 10 tablet  0   colchicine 0.6 MG tablet Take 1 tablet (0.6 mg total) by mouth daily. (Patient taking differently: Take 0.6 mg by mouth daily as needed (gout). ) 30 tablet 2   doxycycline (VIBRAMYCIN) 100 MG capsule Take 100 mg by mouth 2 (two) times daily.     ferrous gluconate (FERGON) 324 MG tablet Take 324 mg by mouth daily with breakfast.     MYORISAN 40 MG capsule Take 40 mg by mouth once a week.   0   Turmeric 500 MG TABS Take 500 mg by mouth daily.      No facility-administered medications prior to visit.   Allergies  Allergen Reactions   Bactrim [Sulfamethoxazole-Trimethoprim] Nausea And Vomiting   Demerol Nausea And Vomiting   Dilaudid  [Hydromorphone Hcl]    Dilaudid [Hydromorphone Hcl] Nausea And Vomiting and Other (See Comments)    Muscle cramping   Reglan [Metoclopramide] Other (See Comments)    Cramps in hands   Stadol [Butorphanol Tartrate] Nausea And Vomiting    Injection   Cephalexin Rash   Penicillins Rash    Rash only; note also rash to Keflex Has patient had a PCN reaction causing immediate rash, facial/tongue/throat swelling, SOB or lightheadedness with hypotension: rash Has patient had a PCN reaction causing severe rash involving  mucus membranes or skin necrosis: no Has patient had a PCN reaction that required hospitalization no Has patient had a PCN reaction occurring within the last 10 years: no If all of the above answers are "NO", then may proceed with Cephalosporin use.   Objective:   Today's Vitals   10/09/22 1327  BP: 120/74  Pulse: 84  Temp: 98 F (36.7 C)  TempSrc: Temporal  SpO2: 97%  Weight: 180 lb (81.6 kg)  Height: 5' 3.5" (1.613 m)   Body mass index is 31.39 kg/m.   General: Well developed, well nourished. No acute distress. Psych: Alert and oriented. Normal mood and affect.  Health Maintenance Due  Topic Date Due   Medicare Annual Wellness (AWV)  Never done   Hepatitis C Screening  Never done   DTaP/Tdap/Td (1 - Tdap) Never done   Zoster Vaccines- Shingrix (1 of 2) Never done   Colonoscopy  Never done   COVID-19 Vaccine (6 - 2023-24 season) 12/23/2021     Assessment & Plan:   Problem List Items Addressed This Visit       Cardiovascular and Mediastinum   Essential hypertension - Primary    Blood pressure is in good control. Continue losartan 25 mg daily and metoprolol succinate 50 mg daily. I will assess renal function today.      Relevant Medications   losartan (COZAAR) 50 MG tablet   metoprolol succinate (TOPROL-XL) 50 MG 24 hr tablet     Digestive   Ulcerative colitis (HCC)    Stable. No signs of bowel obstruction.        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      Relevant Orders   Comprehensive metabolic panel   CBC   Recurrent UTI    Continue trimethoprim 100 mg daily and probiotic use.      Relevant Medications   trimethoprim (TRIMPEX) 100 MG tablet     Other   Ileostomy in place, secondary to ulcerative colitis x 20 years (Chronic)    Functioning well without complaints.      Relevant Orders  Comprehensive metabolic panel   Hyponatremia     Prior low sodium. I will reassess this.      Relevant Orders   Comprehensive metabolic panel   Hypokalemia    Prior low potassium. I will check this. Continue potassium supplement.      Relevant Orders   Comprehensive metabolic panel   H/O unilateral nephrectomy   Vitamin D deficiency    I will reassess this today.      Relevant Orders   VITAMIN D 25 Hydroxy (Vit-D Deficiency, Fractures)   Hypercholesterolemia    I will reassess lipids today.      Relevant Medications   losartan (COZAAR) 50 MG tablet   metoprolol succinate (TOPROL-XL) 50 MG 24 hr tablet   Other Relevant Orders   Lipid panel   Other Visit Diagnoses     Need for pneumococcal 20-valent conjugate vaccination       Relevant Orders   Pneumococcal conjugate vaccine 20-valent (Completed)       Return in about 3 months (around 01/09/2023) for Reassessment.   Loyola Mast, MD

## 2022-10-09 NOTE — Assessment & Plan Note (Signed)
Blood pressure is in good control. Continue losartan 25 mg daily and metoprolol succinate 50 mg daily. I will assess renal function today.

## 2022-10-09 NOTE — Assessment & Plan Note (Signed)
I will reassess lipids today. 

## 2022-10-09 NOTE — Assessment & Plan Note (Signed)
Prior low sodium. I will reassess this.

## 2022-10-09 NOTE — Assessment & Plan Note (Signed)
Functioning well without complaints.

## 2022-10-09 NOTE — Assessment & Plan Note (Signed)
Continue trimethoprim 100 mg daily and probiotic use.

## 2022-10-10 ENCOUNTER — Encounter: Payer: Self-pay | Admitting: Family Medicine

## 2022-10-23 ENCOUNTER — Other Ambulatory Visit: Payer: Self-pay | Admitting: Family Medicine

## 2022-10-23 MED ORDER — METOPROLOL SUCCINATE ER 50 MG PO TB24: 50.0000 mg | ORAL_TABLET | Freq: Every day | ORAL | 3 refills | Status: DC

## 2022-10-23 MED ORDER — FUROSEMIDE 20 MG PO TABS: 20.0000 mg | ORAL_TABLET | Freq: Every day | ORAL | 3 refills | Status: DC

## 2022-10-23 NOTE — Telephone Encounter (Signed)
Prescription Request  10/23/2022  LOV: 10/09/2022  What is the name of the medication or equipment? metoprolol succinate (TOPROL-XL) 50 MG 24 hr tablet [[id number]] and furosemide (LASIX) 20 MG tablet [[id number]]   Have you contacted your pharmacy to request a refill? Yes   Which pharmacy would you like this sent to?  [id number] [place], [place] - [address] AT [facility] OF [address] & [address] [place] [address] Phone: [phone] Fax: [phone]    Patient notified that their request is being sent to the clinical staff for review and that they should receive a response within 2 business days.   Please advise at [facility] [phone]

## 2022-10-23 NOTE — Telephone Encounter (Signed)
Refill request for  Metoprolol Furosemide  LR hx provider LOV 10/09/22 FOV  none scheduled.

## 2022-10-24 NOTE — Telephone Encounter (Signed)
Patient notified VIA phone. Dm/cma  

## 2022-12-01 ENCOUNTER — Ambulatory Visit: Payer: Medicare Other

## 2022-12-01 DIAGNOSIS — Z0000 Encounter for general adult medical examination without abnormal findings: Secondary | ICD-10-CM

## 2022-12-01 NOTE — Patient Instructions (Signed)
Deanna Burke , Thank you for taking time to come for your Medicare Wellness Visit. I appreciate your ongoing commitment to your health goals. Please review the following plan we discussed and let me know if I can assist you in the future.   Referrals/Orders/Follow-Ups/Clinician Recommendations: none  This is a list of the screening recommended for you and due dates:  Health Maintenance  Topic Date Due   Hepatitis C Screening  Never done   DTaP/Tdap/Td vaccine (1 - Tdap) Never done   Zoster (Shingles) Vaccine (1 of 2) Never done   COVID-19 Vaccine (6 - 2023-24 season) 12/23/2021   Flu Shot  11/23/2022   Medicare Annual Wellness Visit  12/01/2023   Mammogram  07/03/2024   Pneumonia Vaccine  Completed   DEXA scan (bone density measurement)  Completed   HPV Vaccine  Aged Out   Colon Cancer Screening  Discontinued    Advanced directives: (Copy Requested) Please bring a copy of your health care power of attorney and living will to the office to be added to your chart at your convenience.  Next Medicare Annual Wellness Visit scheduled for next year: Yes  Preventive Care 74 Years and Older, Female Preventive care refers to lifestyle choices and visits with your health care provider that can promote health and wellness. What does preventive care include? A yearly physical exam. This is also called an annual well check. Dental exams once or twice a year. Routine eye exams. Ask your health care provider how often you should have your eyes checked. Personal lifestyle choices, including: Daily care of your teeth and gums. Regular physical activity. Eating a healthy diet. Avoiding tobacco and drug use. Limiting alcohol use. Practicing safe sex. Taking low-dose aspirin every day. Taking vitamin and mineral supplements as recommended by your health care provider. What happens during an annual well check? The services and screenings done by your health care provider during your annual well  check will depend on your age, overall health, lifestyle risk factors, and family history of disease. Counseling  Your health care provider may ask you questions about your: Alcohol use. Tobacco use. Drug use. Emotional well-being. Home and relationship well-being. Sexual activity. Eating habits. History of falls. Memory and ability to understand (cognition). Work and work Astronomer. Reproductive health. Screening  You may have the following tests or measurements: Height, weight, and BMI. Blood pressure. Lipid and cholesterol levels. These may be checked every 5 years, or more frequently if you are over 29 years old. Skin check. Lung cancer screening. You may have this screening every year starting at age 53 if you have a 30-pack-year history of smoking and currently smoke or have quit within the past 15 years. Fecal occult blood test (FOBT) of the stool. You may have this test every year starting at age 59. Flexible sigmoidoscopy or colonoscopy. You may have a sigmoidoscopy every 5 years or a colonoscopy every 10 years starting at age 29. Hepatitis C blood test. Hepatitis B blood test. Sexually transmitted disease (STD) testing. Diabetes screening. This is done by checking your blood sugar (glucose) after you have not eaten for a while (fasting). You may have this done every 1-3 years. Bone density scan. This is done to screen for osteoporosis. You may have this done starting at age 58. Mammogram. This may be done every 1-2 years. Talk to your health care provider about how often you should have regular mammograms. Talk with your health care provider about your test results, treatment options, and if  necessary, the need for more tests. Vaccines  Your health care provider may recommend certain vaccines, such as: Influenza vaccine. This is recommended every year. Tetanus, diphtheria, and acellular pertussis (Tdap, Td) vaccine. You may need a Td booster every 10 years. Zoster  vaccine. You may need this after age 42. Pneumococcal 13-valent conjugate (PCV13) vaccine. One dose is recommended after age 84. Pneumococcal polysaccharide (PPSV23) vaccine. One dose is recommended after age 32. Talk to your health care provider about which screenings and vaccines you need and how often you need them. This information is not intended to replace advice given to you by your health care provider. Make sure you discuss any questions you have with your health care provider. Document Released: 05/07/2015 Document Revised: 12/29/2015 Document Reviewed: 02/09/2015 Elsevier Interactive Patient Education  2017 [employer].  Fall Prevention in the Home Falls can cause injuries. They can happen to people of all ages. There are many things you can do to make your home safe and to help prevent falls. What can I do on the outside of my home? Regularly fix the edges of walkways and driveways and fix any cracks. Remove anything that might make you trip as you walk through a door, such as a raised step or threshold. Trim any bushes or trees on the path to your home. Use bright outdoor lighting. Clear any walking paths of anything that might make someone trip, such as rocks or tools. Regularly check to see if handrails are loose or broken. Make sure that both sides of any steps have handrails. Any raised decks and porches should have guardrails on the edges. Have any leaves, snow, or ice cleared regularly. Use sand or salt on walking paths during winter. Clean up any spills in your garage right away. This includes oil or grease spills. What can I do in the bathroom? Use night lights. Install grab bars by the toilet and in the tub and shower. Do not use towel bars as grab bars. Use non-skid mats or decals in the tub or shower. If you need to sit down in the shower, use a plastic, non-slip stool. Keep the floor dry. Clean up any water that spills on the floor as soon as it happens. Remove  soap buildup in the tub or shower regularly. Attach bath mats securely with double-sided non-slip rug tape. Do not have throw rugs and other things on the floor that can make you trip. What can I do in the bedroom? Use night lights. Make sure that you have a light by your bed that is easy to reach. Do not use any sheets or blankets that are too Burke for your bed. They should not hang down onto the floor. Have a firm chair that has side arms. You can use this for support while you get dressed. Do not have throw rugs and other things on the floor that can make you trip. What can I do in the kitchen? Clean up any spills right away. Avoid walking on wet floors. Keep items that you use a lot in easy-to-reach places. If you need to reach something above you, use a strong step stool that has a grab bar. Keep electrical cords out of the way. Do not use floor polish or wax that makes floors slippery. If you must use wax, use non-skid floor wax. Do not have throw rugs and other things on the floor that can make you trip. What can I do with my stairs? Do not leave any items  on the stairs. Make sure that there are handrails on both sides of the stairs and use them. Fix handrails that are broken or loose. Make sure that handrails are as long as the stairways. Check any carpeting to make sure that it is firmly attached to the stairs. Fix any carpet that is loose or worn. Avoid having throw rugs at the top or bottom of the stairs. If you do have throw rugs, attach them to the floor with carpet tape. Make sure that you have a light switch at the top of the stairs and the bottom of the stairs. If you do not have them, ask someone to add them for you. What else can I do to help prevent falls? Wear shoes that: Do not have high heels. Have rubber bottoms. Are comfortable and fit you well. Are closed at the toe. Do not wear sandals. If you use a stepladder: Make sure that it is fully opened. Do not climb a  closed stepladder. Make sure that both sides of the stepladder are locked into place. Ask someone to hold it for you, if possible. Clearly mark and make sure that you can see: Any grab bars or handrails. First and last steps. Where the edge of each step is. Use tools that help you move around (mobility aids) if they are needed. These include: Canes. Walkers. Scooters. Crutches. Turn on the lights when you go into a dark area. Replace any light bulbs as soon as they burn out. Set up your furniture so you have a clear path. Avoid moving your furniture around. If any of your floors are uneven, fix them. If there are any pets around you, be aware of where they are. Review your medicines with your doctor. Some medicines can make you feel dizzy. This can increase your chance of falling. Ask your doctor what other things that you can do to help prevent falls. This information is not intended to replace advice given to you by your health care provider. Make sure you discuss any questions you have with your health care provider. Document Released: 02/04/2009 Document Revised: 09/16/2015 Document Reviewed: 05/15/2014 Elsevier Interactive Patient Education  2017 [employer].

## 2022-12-01 NOTE — Progress Notes (Signed)
Subjective:   Deanna Burke is a 72 y.o. female who presents for an Initial Medicare Annual Wellness Visit.  Visit Complete: Virtual  I connected with  Enrique Sack on 12/01/22 by a audio enabled telemedicine application and verified that I am speaking with the correct person using two identifiers.  Patient Location: Home  Provider Location: Office/Clinic  I discussed the limitations of evaluation and management by telemedicine. The patient expressed understanding and agreed to proceed.  Vital Signs: Unable to obtain new vitals due to this being a telehealth visit.  Review of Systems     Cardiac Risk Factors include: advanced age (>2men, >81 women);hypertension     Objective:    Today's Vitals   12/01/22 0926  PainSc: 2    There is no height or weight on file to calculate BMI.     12/01/2022    9:33 AM 07/19/2019    7:31 PM 06/19/2019    2:25 PM 06/17/2019    1:05 PM 12/31/2017    3:04 AM 12/30/2017    8:16 AM 08/08/2016    2:42 PM  Advanced Directives  Does Patient Have a Medical Advance Directive? Yes Yes Yes Yes  No Yes  Type of Estate agent of [place];Living will Healthcare Power of [place];Living will Healthcare Power of [place];Living will Healthcare Power of [place];Living will   Healthcare Power of [place];Living will  Does patient want to make changes to medical advance directive?   No - Patient declined      Copy of Healthcare Power of Attorney in Chart? No - copy requested  No - copy requested      Would patient like information on creating a medical advance directive?     No - Patient declined      Current Medications (verified) Outpatient Encounter Medications as of 12/01/2022  Medication Sig   acetaminophen (TYLENOL) 500 MG tablet Take 1,000 mg by mouth every 6 (six) hours as needed for moderate pain.   allopurinol (ZYLOPRIM) 300 MG tablet Take 300 mg by mouth daily.    aspirin EC 81 MG tablet Take 81 mg by mouth daily.     Bismuth Subgallate 200 MG CHEW Chew 400 mg by mouth in the morning, at noon, in the evening, and at bedtime.    ergocalciferol (VITAMIN D2) 1.25 MG (50000 UT) capsule Take 50,000 Units by mouth once a week.   furosemide (LASIX) 20 MG tablet Take 1 tablet (20 mg total) by mouth daily.   losartan (COZAAR) 50 MG tablet Take 50 mg by mouth daily.   melatonin 3 MG TABS tablet Take by mouth.   metoprolol succinate (TOPROL-XL) 50 MG 24 hr tablet Take 1 tablet (50 mg total) by mouth daily.   potassium chloride SA (K-DUR,KLOR-CON) 20 MEQ tablet Take 1 tablet (20 mEq total) by mouth daily.   Probiotic Product (PROBIOTIC DAILY PO) Take 1 capsule by mouth daily.    trimethoprim (TRIMPEX) 100 MG tablet Take 100 mg by mouth daily.   nitrofurantoin (MACRODANTIN) 100 MG capsule Take 100 mg by mouth daily.  (Patient not taking: Reported on 12/01/2022)   No facility-administered encounter medications on file as of 12/01/2022.    Allergies (verified) Bactrim [sulfamethoxazole-trimethoprim], Demerol, Dilaudid  [hydromorphone hcl], Dilaudid [hydromorphone hcl], Reglan [metoclopramide], Stadol [butorphanol tartrate], Cephalexin, and Penicillins   History: Past Medical History:  Diagnosis Date   Abnormal finding on cardiovascular stress test, ischemia anterolateral 02/13/2012   follow heart cath - pt told one tiny blockage that  didn't even matter   Anemia 1/2/-21   Arthritis    hands, lumbar spine, hips, right ankle   Breast cancer (HCC)    Cancer (HCC)    DCIS L breast   Cataract    Left Eye   Complication of anesthesia 1996   pt has ileostomy and had surgery in 1996 that paralyzed   Fatigue    H/O nephrostomy 08/13/12   H/O ulcerative colitis    History of bone density study 2012   History of kidney stones 2015   nephrectomy   History of recurrent UTIs 02/13/2012   Hot flashes    Hypertension    Ileostomy in place, secondary to ulcerative colitis x 20 years 02/13/2012   Kidney disease, 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 anginal equivilant 02/13/2012   Torn rotator cuff 2005   Ulcerative colitis 1969-1973   Past Surgical History:  Procedure Laterality Date   ABDOMINAL HYSTERECTOMY  1996   partial   APPENDECTOMY     BREAST BIOPSY     BREAST LUMPECTOMY  05/31/11   LEFT BREAST LUMPECTOMY, NEGATIVE MARGINS, HIGH GRADE  DUCTAL  CARCINOMA IN SITU WITH ASSOCIATED CALCIFICATIONS.  ER:+, PR+,    BREAST SURGERY  05/03/11   LEFT BREAST NEEDLE CORE BIOPSY- DCIS   CARDIAC CATHETERIZATION     CATARACT EXTRACTION Left    CHOLECYSTECTOMY N/A 06/19/2019   Procedure: LAPAROSCOPIC CHOLECYSTECTOMY;  Surgeon: Abigail Miyamoto, MD;  Location: [facility];  Service: General;  Laterality: N/A;   COLECTOMY  1973   EXPLORATORY LAPAROTOMY  1978/1990   with lysis of adhesions   exploratory laps     several due to abd pain related to ulcerative colitis   EYE SURGERY  05/08/2011   left cataract removal    ILEOSTOMY  1973   IR GENERIC HISTORICAL  03/08/2016   IR VENOCAVAGRAM IVC 03/08/2016 Oley Balm, MD [facility]   IR GENERIC HISTORICAL  03/08/2016   IR US GUIDE VASC ACCESS RIGHT 03/08/2016 Oley Balm, MD [facility]   JOINT REPLACEMENT     KNEE ARTHROPLASTY     LAPAROSCOPIC NEPHRECTOMY Right 08/14/2012   Procedure: RIGHT LAPAROSCOPIC RETROPERITONEAL LAPAROSCOPIC NEPHRECTOMY ;  Surgeon: Sebastian Ache, MD;  Location: [facility];  Service: Urology;  Laterality: Right;  RIGHT LAPAROSCOPIC RETROPERITONEAL LAPAROSCOPIC NEPHRECTOMY, POSSIBLE OPEN    LEFT HEART  CATHETERIZATION WITH CORONARY ANGIOGRAM N/A 02/13/2012   Procedure: LEFT HEART CATHETERIZATION WITH CORONARY ANGIOGRAM;  Surgeon: Runell Gess, MD;  Location: [facility];  Service: Cardiovascular;  Laterality: N/A;   ROTATOR CUFF REPAIR  2006   Right   TOTAL KNEE ARTHROPLASTY Left 04/01/2015   Procedure: LEFT TOTAL KNEE ARTHROPLASTY;  Surgeon: Jene Every, MD;  Location: [facility];  Service: Orthopedics;  Laterality: Left;   TOTAL KNEE ARTHROPLASTY Right 11/04/2015   Procedure: RIGHT TOTAL KNEE ARTHROPLASTY;  Surgeon: Jene Every, MD;  Location: [facility];  Service: Orthopedics;  Laterality: Right;   TOTAL SHOULDER  ARTHROPLASTY     VAGINOPLASTY  1975   Family History  Problem Relation Age of Onset   Atrial fibrillation Mother    Heart disease Father    Heart attack Father    Atrial fibrillation Brother    Heart attack Paternal Uncle    Breast cancer Maternal Grandmother    Stomach cancer Maternal Grandfather    Cancer Paternal Grandmother        stomach   Cancer Other        Breast   Social History   Socioeconomic History   Marital status: Divorced    Spouse name: Not on file   Number of children: Not on file   Years of education: Not on file   Highest education level: Bachelor's degree (e.g., BA, AB, BS)  Occupational History   Not on file  Tobacco Use   Smoking status: Never   Smokeless tobacco: Never  Vaping Use   Vaping status: Never Used  Substance and Sexual Activity   Alcohol use: No   Drug use: No   Sexual activity: Never    Birth control/protection: Other-see comments    Comment: MENARCHE 11, Nulliparity, MENOPAUSE 1996, HRT X 16 YEARS- STOPPED 04/21/11, BC X 2 YEARS  Other Topics Concern   Not on file  Social History Narrative   Lives alone.  Does not use any assist device.     Social Determinants of Health   Financial Resource Strain: Low Risk  (12/01/2022)   Overall Financial Resource Strain (CARDIA)    Difficulty of Paying Living Expenses: Not hard  at all  Food Insecurity: No Food Insecurity (12/01/2022)   Hunger Vital Sign    Worried About Running Out of Food in the Last Year: Never true    Ran Out of Food in the Last Year: Never true  Transportation Needs: No Transportation Needs (12/01/2022)   PRAPARE - Administrator, Civil Service (Medical): No    Lack of Transportation (Non-Medical): No  Physical Activity: Inactive (12/01/2022)   Exercise Vital Sign    Days of Exercise per Week: 0 days    Minutes of Exercise per Session: 0 min  Stress: No Stress Concern Present (12/01/2022)   [employer] of Occupational Health - Occupational Stress Questionnaire    Feeling of Stress : Not at all  Social Connections: Moderately Isolated (12/01/2022)   Social Connection and Isolation Panel [NHANES]    Frequency of Communication with Friends and Family: More than three times a week    Frequency of Social Gatherings with Friends and Family: More than three times a week    Attends Religious Services: More than 4 times per year    Active Member of [employer] or Organizations: No    Attends Engineer, structural: Never    Marital Status: Divorced    Tobacco Counseling Counseling given: Not Answered   Clinical Intake:  Pre-visit preparation completed: Yes  Pain : 0-10 Pain Score: 2  Pain Type: Chronic pain Pain Location: Shoulder Pain Orientation: Right Pain Descriptors / Indicators: Aching Pain Onset: 1 to 4 weeks ago Pain Frequency: Intermittent     Nutritional Risks: None Diabetes: No  How often do you need to have someone help you when you read instructions, pamphlets, or other written materials from your doctor or pharmacy?: 1 - Never  Interpreter Needed?: No  Information entered by :: NAllen LPN   Activities of Daily Living    12/01/2022    9:27 AM  In your  present state of health, do you have any difficulty performing the following activities:  Hearing? 0  Vision? 0  Difficulty concentrating or making  decisions? 0  Walking or climbing stairs? 0  Dressing or bathing? 0  Doing errands, shopping? 0  Preparing Food and eating ? N  Using the Toilet? N  In the past six months, have you accidently leaked urine? N  Do you have problems with loss of bowel control? N  Managing your Medications? N  Managing your Finances? N  Housekeeping or managing your Housekeeping? N    Patient Care Team: Loyola Mast, MD as PCP - General (Family Medicine)  Indicate any recent Medical Services you may have received from other than [facility] providers in the past year (date may be approximate).     Assessment:   This is a routine wellness examination for Aashrita.  Hearing/Vision screen Hearing Screening - Comments:: Denies hearing issues Vision Screening - Comments:: Regular eye exams, [facility]  Dietary issues and exercise activities discussed:     Goals Addressed             This Visit's Progress    Patient Stated       12/01/2022, wants to eat better and do more exercise       Depression Screen    12/01/2022    9:34 AM 10/09/2022    1:32 PM  PHQ 2/9 Scores  PHQ - 2 Score 0 0  PHQ- 9 Score 0 0    Fall Risk    12/01/2022    9:34 AM 10/09/2022    1:32 PM  Fall Risk   Falls in the past year? 0 0  Number falls in past yr: 0 0  Injury with Fall? 0 0  Risk for fall due to : Medication side effect No Fall Risks  Follow up Falls prevention discussed;Falls evaluation completed Falls evaluation completed    MEDICARE RISK AT HOME:  Medicare Risk at Home - 12/01/22 0934     Any stairs in or around the home? Yes    If so, are there any without handrails? Yes    Home free of loose throw rugs in walkways, pet beds, electrical cords, etc? Yes    Adequate lighting in your home to reduce risk of falls? Yes    Life alert? No    Use of a cane, walker or w/c? No    Grab bars in the bathroom? Yes    Shower chair or bench in shower? Yes    Elevated toilet seat or a handicapped toilet? Yes              TIMED UP AND GO:  Was the test performed? No    Cognitive Function:        12/01/2022    9:35 AM  6CIT Screen  What Year? 0 points  What month? 0 points  What time? 0 points  Count back from 20 0 points  Months in reverse 0 points  Repeat phrase 0 points  Total Score 0 points    Immunizations Immunization History  Administered Date(s) Administered   Influenza Split 01/23/2012, 02/20/2022   Influenza, High Dose Seasonal PF 01/01/2018   Influenza-Unspecified 01/24/2016   PFIZER(Purple Top)SARS-COV-2 Vaccination 05/19/2019, 06/09/2019, 03/29/2020   PNEUMOCOCCAL CONJUGATE-20 10/09/2022   Pfizer Covid-19 Vaccine Bivalent Booster 24yrs & up 10/22/2020, 03/23/2021   RSV,unspecified 05/04/2022    TDAP status: Due, Education has been provided regarding the importance of this vaccine. Advised may  receive this vaccine at local pharmacy or Health Dept. Aware to provide a copy of the vaccination record if obtained from local pharmacy or Health Dept. Verbalized acceptance and understanding.  Flu Vaccine status: Due, Education has been provided regarding the importance of this vaccine. Advised may receive this vaccine at local pharmacy or Health Dept. Aware to provide a copy of the vaccination record if obtained from local pharmacy or Health Dept. Verbalized acceptance and understanding.  Pneumococcal vaccine status: Up to date  Covid-19 vaccine status: Information provided on how to obtain vaccines.   Qualifies for Shingles Vaccine? Yes   Zostavax completed Yes   Shingrix Completed?: Yes  Screening Tests Health Maintenance  Topic Date Due   Hepatitis C Screening  Never done   DTaP/Tdap/Td (1 - Tdap) Never done   Zoster Vaccines- Shingrix (1 of 2) Never done   COVID-19 Vaccine (6 - 2023-24 season) 12/23/2021   INFLUENZA VACCINE  11/23/2022   Medicare Annual Wellness (AWV)  12/01/2023   MAMMOGRAM  07/03/2024   Pneumonia Vaccine 75+ Years old  Completed   DEXA  SCAN  Completed   HPV VACCINES  Aged Out   Colonoscopy  Discontinued    Health Maintenance  Health Maintenance Due  Topic Date Due   Hepatitis C Screening  Never done   DTaP/Tdap/Td (1 - Tdap) Never done   Zoster Vaccines- Shingrix (1 of 2) Never done   COVID-19 Vaccine (6 - 2023-24 season) 12/23/2021   INFLUENZA VACCINE  11/23/2022    Colorectal cancer screening: No longer required.   Mammogram status: Completed 07/04/2022. Repeat every year  Bone Density status: Completed 04/04/2006.  Lung Cancer Screening: (Low Dose CT Chest recommended if Age 63-80 years, 20 pack-year currently smoking OR have quit w/in 15years.) does not qualify.   Lung Cancer Screening Referral: no  Additional Screening:  Hepatitis C Screening: does qualify;   Vision Screening: Recommended annual ophthalmology exams for early detection of glaucoma and other disorders of the eye. Is the patient up to date with their annual eye exam?  Yes  Who is the provider or what is the name of the office in which the patient attends annual eye exams? [facility] If pt is not established with a provider, would they like to be referred to a provider to establish care? No .   Dental Screening: Recommended annual dental exams for proper oral hygiene  Diabetic Foot Exam: n/a  Community Resource Referral / Chronic Care Management: CRR required this visit?  No   CCM required this visit?  No     Plan:     I have personally reviewed and noted the following in the patient's chart:   Medical and social history Use of alcohol, tobacco or illicit drugs  Current medications and supplements including opioid prescriptions. Patient is not currently taking opioid prescriptions. Functional ability and status Nutritional status Physical activity Advanced directives List of other physicians Hospitalizations, surgeries, and ER visits in previous 12 months Vitals Screenings to include cognitive, depression, and  falls Referrals and appointments  In addition, I have reviewed and discussed with patient certain preventive protocols, quality metrics, and best practice recommendations. A written personalized care plan for preventive services as well as general preventive health recommendations were provided to patient.     Barb Merino, LPN   08/26/979   After Visit Summary: (MyChart) Due to this being a telephonic visit, the after visit summary with patients personalized plan was offered to patient via MyChart  Nurse Notes: none

## 2022-12-11 ENCOUNTER — Ambulatory Visit (INDEPENDENT_AMBULATORY_CARE_PROVIDER_SITE_OTHER): Payer: Medicare Other | Admitting: Family Medicine

## 2022-12-11 VITALS — BP 120/68 | HR 91 | Temp 98.4°F | Ht 63.5 in | Wt 182.0 lb

## 2022-12-11 DIAGNOSIS — M7581 Other shoulder lesions, right shoulder: Secondary | ICD-10-CM

## 2022-12-11 MED ORDER — TRIAMCINOLONE ACETONIDE 40 MG/ML IJ SUSP: 40.0000 mg | Freq: Once | INTRAMUSCULAR | Status: DC

## 2022-12-11 MED ORDER — LIDOCAINE HCL 2 % IJ SOLN
1.0000 mL | Freq: Once | INTRAMUSCULAR | Status: AC
  Administered 2022-12-11: 20 mg

## 2022-12-11 MED ORDER — TRIAMCINOLONE ACETONIDE 40 MG/ML IJ SUSP
40.0000 mg | Freq: Once | INTRAMUSCULAR | Status: AC
  Administered 2022-12-11: 40 mg via INTRA_ARTICULAR

## 2022-12-11 NOTE — Progress Notes (Signed)
[facility] [address] [place] [address] Dept: [phone] Dept Fax: [phone]  Office Visit  Subjective:    Patient ID: Deanna Burke, female    DOB: [birth year], 72 y.o..   MRN: [id number]  Chief Complaint  Patient presents with   Shoulder Pain    Right   History of Present Illness:  Patient is in today complaining of right shoulder pain. She notes she had surgery on this shoulder about 10 years ago for an acromioplasty. She started having pain 2 months ago, gradual in onset. Ms. Snavely plays the piano. She finds that holding her arms up to play for an hour is almost impossible for her right now.   Past Medical History: Patient Active Problem List   Diagnosis Date Noted   Small bowel obstruction (HCC) 12/30/2017   History of chronic kidney disease 10/08/2017   History of kidney stones 10/08/2017   History of osteoporosis 09/19/2017   Vitamin D insufficiency 09/19/2017   Hypercholesterolemia 09/19/2017   OSA (obstructive sleep apnea) 09/19/2017   Other insomnia 09/19/2017   Recurrent UTI 09/19/2017   Primary osteoarthritis of both hands 09/19/2017   S/P TKR (total knee replacement), bilateral 09/19/2017   Ulcerative colitis (HCC) 05/10/2015   H/O unilateral nephrectomy 05/10/2015   Essential hypertension 04/07/2015   History of pulmonary embolism    Malignant neoplasm of upper-outer quadrant of left breast in female, estrogen receptor positive (HCC) 04/07/2013   Neoplasm of left breast, primary tumor staging category Tis: ductal carcinoma in situ (DCIS) 01/27/2013   Pyelonephritis, acute 06/05/2012   Hydronephrosis of right kidney 06/05/2012   Hyponatremia 06/05/2012   Hypokalemia 06/05/2012   Acute kidney injury (HCC) 06/05/2012   Stage 3b chronic kidney disease (CKD) (HCC) 02/13/2012   Abnormal finding on cardiovascular stress test, ischemia anterolateral 02/13/2012   Syncope, ? anginal equivilant  02/13/2012   Ileostomy in place, secondary to ulcerative colitis x 20 years 02/13/2012   Past Surgical History:  Procedure Laterality Date   ABDOMINAL HYSTERECTOMY  1996   partial   APPENDECTOMY     BREAST BIOPSY     BREAST LUMPECTOMY  05/31/11   LEFT BREAST LUMPECTOMY, NEGATIVE MARGINS, HIGH GRADE  DUCTAL  CARCINOMA IN SITU WITH ASSOCIATED CALCIFICATIONS.  ER:+, PR+,    BREAST SURGERY  05/03/11   LEFT BREAST NEEDLE CORE BIOPSY- DCIS   CARDIAC CATHETERIZATION     CATARACT EXTRACTION Left    CHOLECYSTECTOMY N/A 06/19/2019   Procedure: LAPAROSCOPIC CHOLECYSTECTOMY;  Surgeon: Abigail Miyamoto, MD;  Location: [facility];  Service: General;  Laterality: N/A;   COLECTOMY  1973   EXPLORATORY LAPAROTOMY  1978/1990   with lysis of adhesions   exploratory laps     several due to abd pain related to ulcerative colitis   EYE SURGERY  05/08/2011   left cataract removal    ILEOSTOMY  1973   IR GENERIC HISTORICAL  03/08/2016   IR VENOCAVAGRAM IVC 03/08/2016 Oley Balm, MD [facility]   IR GENERIC HISTORICAL  03/08/2016   IR US GUIDE VASC ACCESS RIGHT 03/08/2016 Oley Balm, MD [facility]   JOINT REPLACEMENT     KNEE ARTHROPLASTY     LAPAROSCOPIC NEPHRECTOMY Right 08/14/2012   Procedure: RIGHT LAPAROSCOPIC RETROPERITONEAL LAPAROSCOPIC NEPHRECTOMY ;  Surgeon: Sebastian Ache, MD;  Location: [facility];  Service: Urology;  Laterality: Right;  RIGHT LAPAROSCOPIC RETROPERITONEAL LAPAROSCOPIC NEPHRECTOMY, POSSIBLE OPEN    LEFT HEART CATHETERIZATION WITH CORONARY ANGIOGRAM N/A 02/13/2012   Procedure: LEFT  HEART CATHETERIZATION WITH CORONARY ANGIOGRAM;  Surgeon: Runell Gess, MD;  Location: [facility] CATH [facility];  Service: Cardiovascular;  Laterality: N/A;   ROTATOR CUFF REPAIR  2006   Right   TOTAL KNEE ARTHROPLASTY Left 04/01/2015   Procedure: LEFT TOTAL KNEE ARTHROPLASTY;  Surgeon: Jene Every, MD;  Location: [facility];  Service: Orthopedics;  Laterality: Left;   TOTAL KNEE ARTHROPLASTY Right 11/04/2015    Procedure: RIGHT TOTAL KNEE ARTHROPLASTY;  Surgeon: Jene Every, MD;  Location: [facility];  Service: Orthopedics;  Laterality: Right;   TOTAL SHOULDER ARTHROPLASTY     VAGINOPLASTY  1975   Family History  Problem Relation Age of Onset   Atrial fibrillation Mother    Heart disease Father    Heart attack Father    Atrial fibrillation Brother    Heart attack Paternal Uncle    Breast cancer Maternal Grandmother    Stomach cancer Maternal Grandfather    Cancer Paternal Grandmother        stomach   Cancer Other        Breast   Outpatient Medications Prior to Visit  Medication Sig Dispense Refill   acetaminophen (TYLENOL) 500 MG tablet Take 1,000 mg by mouth every 6 (six) hours as needed for moderate pain.     allopurinol (ZYLOPRIM) 300 MG tablet Take 300 mg by mouth daily.   2   aspirin EC 81 MG tablet Take 81 mg by mouth daily.      Bismuth Subgallate 200 MG CHEW Chew 400 mg by mouth in the morning, at noon, in the evening, and at bedtime.      cholecalciferol (VITAMIN D3) 25 MCG (1000 UNIT) tablet Take 1,000 Units by mouth daily.     furosemide (LASIX) 20 MG tablet Take 1 tablet (20 mg total) by mouth daily. 90 tablet 3   losartan (COZAAR) 50 MG tablet Take 25 mg by mouth daily.     melatonin 3 MG TABS tablet Take 10 mg by mouth.     metoprolol succinate (TOPROL-XL) 50 MG 24 hr tablet Take 1 tablet (50 mg total) by mouth daily. 90 tablet 3   potassium chloride SA (K-DUR,KLOR-CON) 20 MEQ tablet Take 1 tablet (20 mEq total) by mouth daily. 30 tablet 0   Probiotic Product (PROBIOTIC DAILY PO) Take 1 capsule by mouth daily.      trimethoprim (TRIMPEX) 100 MG tablet Take 100 mg by mouth daily.     ergocalciferol (VITAMIN D2) 1.25 MG (50000 UT) capsule Take 50,000 Units by mouth once a week. (Patient not taking: Reported on 12/11/2022)     nitrofurantoin (MACRODANTIN) 100 MG capsule Take 100 mg by mouth daily.  (Patient not taking: Reported on 12/01/2022)     No facility-administered medications  prior to visit.   Allergies  Allergen Reactions   Bactrim [Sulfamethoxazole-Trimethoprim] Nausea And Vomiting   Demerol Nausea And Vomiting   Dilaudid  [Hydromorphone Hcl]    Dilaudid [Hydromorphone Hcl] Nausea And Vomiting and Other (See Comments)    Muscle cramping   Reglan [Metoclopramide] Other (See Comments)    Cramps in hands   Stadol [Butorphanol Tartrate] Nausea And Vomiting    Injection   Cephalexin Rash   Penicillins Rash    Rash only; note also rash to Keflex Has patient had a PCN reaction causing immediate rash, facial/tongue/throat swelling, SOB or lightheadedness with hypotension: rash Has patient had a PCN reaction causing severe rash involving mucus membranes or skin necrosis: no Has patient had a PCN reaction that required  hospitalization no Has patient had a PCN reaction occurring within the last 10 years: no If all of the above answers are "NO", then may proceed with Cephalosporin use.     Objective:   Today's Vitals   12/11/22 1448  BP: 120/68  Pulse: 91  Temp: 98.4 F (36.9 C)  TempSrc: Temporal  SpO2: 97%  Weight: 182 lb (82.6 kg)  Height: 5' 3.5" (1.613 m)   Body mass index is 31.73 kg/m.   General: Well developed, well nourished. No acute distress. Extremities: Mildly limited in full abduction and internal rotation. No joint swelling, popping or tenderness. No   subluxation noted. There is increased pain with resisted FABER, but with good rotator cuff strength.  Psych: Alert and oriented. Normal mood and affect.  Health Maintenance Due  Topic Date Due   Hepatitis C Screening  Never done   DTaP/Tdap/Td (1 - Tdap) Never done   Zoster Vaccines- Shingrix (1 of 2) Never done   PROCEDURE- Steroid Joint Injection Indication: Right rotator cuff tendinitis Joint: Right shulder Medication: Triamcinolone 40 mg/ml 1 ml, lidocaine 2% 1 ml  PARQ reviewed with patient. Consent signed. Injection site cleaned with alcohol. Needle introduced into joint  space without difficulty. Medication administered without difficulty and needle removed. Sterile bandage applied. Joint moved through gentle range of motion. Patient tolerated procedure well. Routine post injection care instructions reviewed with patient.    Assessment & Plan:   Problem List Items Addressed This Visit   None Visit Diagnoses     Tendinitis of right rotator cuff    -  Primary   Shoulder injected as above. I will refer her to orthopedics for further management.   Relevant Orders   Ambulatory referral to Orthopedics      No follow-ups on file.   Loyola Mast, MD

## 2023-05-04 ENCOUNTER — Other Ambulatory Visit: Payer: Self-pay | Admitting: Family Medicine

## 2023-05-13 ENCOUNTER — Encounter: Payer: Self-pay | Admitting: Family Medicine

## 2023-05-14 ENCOUNTER — Ambulatory Visit: Payer: Self-pay | Admitting: Family Medicine

## 2023-05-14 ENCOUNTER — Ambulatory Visit (INDEPENDENT_AMBULATORY_CARE_PROVIDER_SITE_OTHER): Payer: Medicare Other | Admitting: Family Medicine

## 2023-05-14 VITALS — BP 126/72 | HR 111 | Temp 98.6°F | Ht 63.0 in | Wt 184.2 lb

## 2023-05-14 DIAGNOSIS — S4992XA Unspecified injury of left shoulder and upper arm, initial encounter: Secondary | ICD-10-CM | POA: Insufficient documentation

## 2023-05-14 DIAGNOSIS — S0081XA Abrasion of other part of head, initial encounter: Secondary | ICD-10-CM | POA: Insufficient documentation

## 2023-05-14 NOTE — Telephone Encounter (Signed)
Copied from CRM [id number]. Topic: Clinical - Red Word Triage >> May 14, 2023  9:52 AM Isabell A wrote: Red Word that prompted transfer to Nurse Triage: Patient had a fall on ice Friday night, experiencing shoulder pain.   Chief Complaint: Left shoulder pain. Symptoms: Weakness, Unable to lift Frequency: Since Friday Pertinent Negatives: Patient denies numbness or weakness.  Disposition: [] ED /[] Urgent Care (no appt availability in office) / [x] Appointment(In office/virtual)/ []  [facility]/ [] Home Care/ [] Refused Recommended Disposition /[] [facility]/ []  Follow-up with PCP Additional Notes: Deanna Burke is a 73 year old female being triaged today for limited ROM and pain in the left shoulder. The patient is unable to lift her left arm without pain and states feeling like a "vice grip," is on her shoulder. The patient is without acute distress like symptoms but previously fell this past Friday. Although the patient does not report injuring her left shoulder during the fall, the patient reports the injury could have been caused by improper positioning during the event. History of a rotator cuff repair on the right shoulder. States her prescribed prednisone has been helping with the pain, but not so much with the Range of motion.      Reason for Disposition  [1] MODERATE pain (e.g., interferes with normal activities) AND [2] present > 3 days  Answer Assessment - Initial Assessment Questions 1. ONSET: "When did the pain start?"     Since Friday  2. LOCATION: "Where is the pain located?"     Left Shoulder  3. PAIN: "How bad is the pain?" (Scale 1-10; or mild, moderate, severe)   - MILD (1-3): doesn't interfere with normal activities   - MODERATE (4-7): interferes with normal activities (e.g., work or school) or awakens from sleep   - SEVERE (8-10): excruciating pain, unable to do any normal activities, unable to move arm at all due to pain     2 at rest, 8 with  exertion  4. WORK OR EXERCISE: "Has there been any recent work or exercise that involved this part of the body?"     No  5. CAUSE: "What do you think is causing the shoulder pain?"     Fall on Friday  6. OTHER SYMPTOMS: "Do you have any other symptoms?" (e.g., neck pain, swelling, rash, fever, numbness, weakness)     Unable to lift left arm.   7. PREGNANCY: "Is there any chance you are pregnant?" "When was your last menstrual period?"     No  Protocols used: Shoulder Pain-A-AH

## 2023-05-14 NOTE — Telephone Encounter (Signed)
Patient scheduled with Dr Doreene Burke @ 2:00 pm today. Dm/cma

## 2023-05-14 NOTE — Progress Notes (Signed)
Established Patient Office Visit   Subjective:  Patient ID: Deanna Burke, female    DOB: July 17, 1950  Age: 73 y.o. MRN: [id number]  No chief complaint on file.   HPI Encounter Diagnoses  Name Primary?   Abrasion of face, initial encounter Yes   Injury of left shoulder, initial encounter    Slipped on the ice 3 days ago and fell forward landing on her face.  She sustained abrasions to her knees, left upper lip and nose.  These are healing nicely.  She is also having significant pain in her left shoulder.  There is pain with any range of motion.  She had a 6-day prednisone Dosepak that her orthopedist is given her and decided to start it.  It has been helping.   Review of Systems  Constitutional: Negative.   HENT: Negative.    Eyes:  Negative for blurred vision, discharge and redness.  Respiratory: Negative.    Cardiovascular: Negative.   Gastrointestinal:  Negative for abdominal pain.  Genitourinary: Negative.   Musculoskeletal:  Positive for joint pain. Negative for myalgias.  Skin:  Negative for rash.  Neurological:  Negative for tingling, loss of consciousness and weakness.  Endo/Heme/Allergies:  Negative for polydipsia.     Current Outpatient Medications:    acetaminophen (TYLENOL) 500 MG tablet, Take 1,000 mg by mouth every 6 (six) hours as needed for moderate pain., Disp: , Rfl:    allopurinol (ZYLOPRIM) 300 MG tablet, Take 300 mg by mouth daily. , Disp: , Rfl: 2   aspirin EC 81 MG tablet, Take 81 mg by mouth daily. , Disp: , Rfl:    Bismuth Subgallate 200 MG CHEW, Chew 400 mg by mouth in the morning, at noon, in the evening, and at bedtime. , Disp: , Rfl:    cholecalciferol (VITAMIN D3) 25 MCG (1000 UNIT) tablet, Take 1,000 Units by mouth daily., Disp: , Rfl:    furosemide (LASIX) 20 MG tablet, Take 1 tablet (20 mg total) by mouth daily., Disp: 90 tablet, Rfl: 3   losartan (COZAAR) 50 MG tablet, TAKE 1 TABLET BY MOUTH DAILY, Disp: 90 tablet, Rfl: 3   melatonin 3  MG TABS tablet, Take 10 mg by mouth., Disp: , Rfl:    metoprolol succinate (TOPROL-XL) 50 MG 24 hr tablet, Take 1 tablet (50 mg total) by mouth daily., Disp: 90 tablet, Rfl: 3   potassium chloride SA (K-DUR,KLOR-CON) 20 MEQ tablet, Take 1 tablet (20 mEq total) by mouth daily., Disp: 30 tablet, Rfl: 0   Probiotic Product (PROBIOTIC DAILY PO), Take 1 capsule by mouth daily. , Disp: , Rfl:    trimethoprim (TRIMPEX) 100 MG tablet, Take 100 mg by mouth daily., Disp: , Rfl:    Objective:     BP 126/72   Pulse (!) 111   Temp 98.6 F (37 C)   Ht 5\' 3"  (1.6 m)   Wt 184 lb 3.2 oz (83.6 kg)   SpO2 97%   BMI 32.63 kg/m    Physical Exam Constitutional:      General: She is not in acute distress.    Appearance: Normal appearance. She is not ill-appearing, toxic-appearing or diaphoretic.  HENT:     Head: Normocephalic and atraumatic.     Right Ear: External ear normal.     Left Ear: External ear normal.  Eyes:     General: No scleral icterus.       Right eye: No discharge.        Left eye: No  discharge.     Extraocular Movements: Extraocular movements intact.     Conjunctiva/sclera: Conjunctivae normal.  Pulmonary:     Effort: Pulmonary effort is normal. No respiratory distress.  Musculoskeletal:     Left shoulder: Tenderness and bony tenderness present. Decreased range of motion.     Comments: Internal rotation intact with pain.  Cannot abduct past 30 degrees without pain.  Tenderness to palpation of proximal humerus and AC joint.  Skin:    General: Skin is warm and dry.     Comments: Sealed and healing laceration left upper lip.  Healing abrasion on bridge of left side of nose.  Neurological:     Mental Status: She is alert and oriented to person, place, and time.  Psychiatric:        Mood and Affect: Mood normal.        Behavior: Behavior normal.      No results found for any visits on 05/14/23.    The 10-year ASCVD risk score (Arnett DK, et al., 2019) is: 18.5%     Assessment & Plan:   Abrasion of face, initial encounter  Injury of left shoulder, initial encounter    Return if symptoms worsen or fail to improve.  Advised patient to be seen at [facility].   Mliss Sax, MD

## 2023-05-31 ENCOUNTER — Other Ambulatory Visit: Payer: Self-pay | Admitting: Specialist

## 2023-05-31 DIAGNOSIS — M25512 Pain in left shoulder: Secondary | ICD-10-CM

## 2023-06-04 ENCOUNTER — Ambulatory Visit
Admission: RE | Admit: 2023-06-04 | Discharge: 2023-06-04 | Disposition: A | Discharge status: Home / Self Care | Payer: Medicare Other | Source: Ambulatory Visit | Attending: Specialist | Admitting: Specialist

## 2023-06-04 DIAGNOSIS — M25512 Pain in left shoulder: Secondary | ICD-10-CM

## 2023-06-18 ENCOUNTER — Other Ambulatory Visit: Payer: Self-pay | Admitting: Family Medicine

## 2023-06-18 DIAGNOSIS — Z1231 Encounter for screening mammogram for malignant neoplasm of breast: Secondary | ICD-10-CM

## 2023-07-06 ENCOUNTER — Ambulatory Visit
Admission: RE | Admit: 2023-07-06 | Discharge: 2023-07-06 | Disposition: A | Discharge status: Home / Self Care | Payer: Medicare Other | Source: Ambulatory Visit | Attending: Family Medicine | Admitting: Family Medicine

## 2023-07-06 DIAGNOSIS — Z1231 Encounter for screening mammogram for malignant neoplasm of breast: Secondary | ICD-10-CM

## 2023-07-13 ENCOUNTER — Other Ambulatory Visit: Payer: Self-pay | Admitting: Physician Assistant

## 2023-07-13 DIAGNOSIS — M25511 Pain in right shoulder: Secondary | ICD-10-CM

## 2023-07-23 ENCOUNTER — Encounter: Payer: Self-pay | Admitting: Family Medicine

## 2023-07-23 ENCOUNTER — Ambulatory Visit (INDEPENDENT_AMBULATORY_CARE_PROVIDER_SITE_OTHER): Admitting: Family Medicine

## 2023-07-23 VITALS — BP 120/70 | HR 108 | Temp 98.2°F | Ht 63.0 in | Wt 182.8 lb

## 2023-07-23 DIAGNOSIS — M19011 Primary osteoarthritis, right shoulder: Secondary | ICD-10-CM | POA: Diagnosis not present

## 2023-07-23 DIAGNOSIS — Z932 Ileostomy status: Secondary | ICD-10-CM

## 2023-07-23 DIAGNOSIS — Z01818 Encounter for other preprocedural examination: Secondary | ICD-10-CM | POA: Diagnosis not present

## 2023-07-23 DIAGNOSIS — I10 Essential (primary) hypertension: Secondary | ICD-10-CM | POA: Diagnosis not present

## 2023-07-23 DIAGNOSIS — M19012 Primary osteoarthritis, left shoulder: Secondary | ICD-10-CM

## 2023-07-23 DIAGNOSIS — N1832 Chronic kidney disease, stage 3b: Secondary | ICD-10-CM | POA: Diagnosis not present

## 2023-07-23 LAB — CBC
HCT: 41.9 % (ref 36.0–46.0)
Hemoglobin: 14 g/dL (ref 12.0–15.0)
MCHC: 33.4 g/dL (ref 30.0–36.0)
MCV: 94.9 fl (ref 78.0–100.0)
Platelets: 274 10*3/uL (ref 150.0–400.0)
RBC: 4.41 Mil/uL (ref 3.87–5.11)
RDW: 14.4 % (ref 11.5–15.5)
WBC: 6.1 10*3/uL (ref 4.0–10.5)

## 2023-07-23 LAB — COMPREHENSIVE METABOLIC PANEL WITH GFR
ALT: 16 U/L (ref 0–35)
AST: 18 U/L (ref 0–37)
Albumin: 4.5 g/dL (ref 3.5–5.2)
Alkaline Phosphatase: 94 U/L (ref 39–117)
BUN: 26 mg/dL — ABNORMAL HIGH (ref 6–23)
CO2: 24 meq/L (ref 19–32)
Calcium: 10.6 mg/dL — ABNORMAL HIGH (ref 8.4–10.5)
Chloride: 104 meq/L (ref 96–112)
Creatinine, Ser: 1.2 mg/dL (ref 0.40–1.20)
GFR: 44.58 mL/min — ABNORMAL LOW (ref >60.00)
Glucose, Bld: 89 mg/dL (ref 70–99)
Potassium: 3.9 meq/L (ref 3.5–5.1)
Sodium: 139 meq/L (ref 135–145)
Total Bilirubin: 1.1 mg/dL (ref 0.2–1.2)
Total Protein: 7.4 g/dL (ref 6.0–8.3)

## 2023-07-23 NOTE — Assessment & Plan Note (Signed)
 I will reassess renal function today. BP is in good control.

## 2023-07-23 NOTE — Progress Notes (Signed)
 [facility] [address] [place] [address] Dept: [phone] Dept Fax: [phone]  Preoperative Exam Visit  Subjective:    Patient ID: Deanna Burke, female    DOB: 1951-04-23, 73 y.o..   MRN: [id number]  Chief Complaint  Patient presents with   Pre-op Exam    Pre-op clearance for shoulder.   Having both RT and LT shoulder .   History of Present Illness:  Patient is in today for clearance prior to a proposed left reverse total shoulder arthroplasty. She has osteoarthritis of both shoulders. It has progressed to where she can no longer play the piano more than a few minutes. Elwyn Reach playing is one of her joys in life.  Ms. Brasington has a history of hypertension. She is managed on losartan 50 mg 1/2 tab daily and metoprolol succinate 50 mg daily.  She does take some furosemide for lower leg edema issues, but denies any edema currently.  Ms. Diesing denies any chest pain. She is not normally short of breath, but does admit to dyspnea when climbing a full flight of stairs. Her record indicates she had a prior issue with an abnormal stress test in 2013. She notes at the time, so further evaluation or treatment was recommended. She has never been a smoker. Past Medical History: Patient Active Problem List   Diagnosis Date Noted   Primary osteoarthritis of both shoulders 07/23/2023   Abrasion of face 05/14/2023   Injury of left shoulder 05/14/2023   Small bowel obstruction (HCC) 12/30/2017   History of chronic kidney disease 10/08/2017   History of kidney stones 10/08/2017   History of osteoporosis 09/19/2017   Vitamin D insufficiency 09/19/2017   Hypercholesterolemia 09/19/2017   Other insomnia 09/19/2017   Recurrent UTI 09/19/2017   Primary osteoarthritis of both hands 09/19/2017   S/P TKR (total knee replacement), bilateral 09/19/2017   Ulcerative colitis (HCC) 05/10/2015   H/O unilateral nephrectomy 05/10/2015    Essential hypertension 04/07/2015   History of pulmonary embolism    Malignant neoplasm of upper-outer quadrant of left breast in female, estrogen receptor positive (HCC) 04/07/2013   Neoplasm of left breast, primary tumor staging category Tis: ductal carcinoma in situ (DCIS) 01/27/2013   Pyelonephritis, acute 06/05/2012   Hydronephrosis of right kidney 06/05/2012   Hyponatremia 06/05/2012   Hypokalemia 06/05/2012   Acute kidney injury (HCC) 06/05/2012   Stage 3b chronic kidney disease (CKD) (HCC) 02/13/2012   Abnormal finding on cardiovascular stress test, ischemia anterolateral 02/13/2012   Syncope, ? anginal equivilant 02/13/2012   Ileostomy in place, secondary to ulcerative colitis x 20 years 02/13/2012   Past Surgical History:  Procedure Laterality Date   ABDOMINAL HYSTERECTOMY  1996   partial   APPENDECTOMY     BREAST BIOPSY     BREAST LUMPECTOMY  05/31/11   LEFT BREAST LUMPECTOMY, NEGATIVE MARGINS, HIGH GRADE  DUCTAL  CARCINOMA IN SITU WITH ASSOCIATED CALCIFICATIONS.  ER:+, PR+,    BREAST SURGERY  05/03/11   LEFT BREAST NEEDLE CORE BIOPSY- DCIS   CARDIAC CATHETERIZATION     CATARACT EXTRACTION Left    CHOLECYSTECTOMY N/A 06/19/2019   Procedure: LAPAROSCOPIC CHOLECYSTECTOMY;  Surgeon: Abigail Miyamoto, MD;  Location: [facility];  Service: General;  Laterality: N/A;   COLECTOMY  1973   EXPLORATORY LAPAROTOMY  1978/1990   with lysis of adhesions   exploratory laps     several due to abd pain related to ulcerative colitis   EYE SURGERY  05/08/2011  left cataract removal    ILEOSTOMY  1973   IR GENERIC HISTORICAL  03/08/2016   IR VENOCAVAGRAM IVC 03/08/2016 Oley Balm, MD [facility]   IR GENERIC HISTORICAL  03/08/2016   IR US GUIDE VASC ACCESS RIGHT 03/08/2016 Oley Balm, MD [facility]   JOINT REPLACEMENT     KNEE ARTHROPLASTY     LAPAROSCOPIC NEPHRECTOMY Right 08/14/2012   Procedure: RIGHT LAPAROSCOPIC RETROPERITONEAL LAPAROSCOPIC NEPHRECTOMY ;  Surgeon: Sebastian Ache, MD;  Location: [facility];  Service: Urology;  Laterality: Right;  RIGHT LAPAROSCOPIC RETROPERITONEAL LAPAROSCOPIC NEPHRECTOMY, POSSIBLE OPEN    LEFT HEART CATHETERIZATION WITH CORONARY ANGIOGRAM N/A 02/13/2012   Procedure: LEFT HEART CATHETERIZATION WITH CORONARY ANGIOGRAM;  Surgeon: Runell Gess, MD;  Location: [facility] CATH LAB;  Service: Cardiovascular;  Laterality: N/A;   ROTATOR CUFF REPAIR  2006   Right   TOTAL KNEE ARTHROPLASTY Left 04/01/2015   Procedure: LEFT TOTAL KNEE ARTHROPLASTY;  Surgeon: Jene Every, MD;  Location: [facility];  Service: Orthopedics;  Laterality: Left;   TOTAL KNEE ARTHROPLASTY Right 11/04/2015   Procedure: RIGHT TOTAL KNEE ARTHROPLASTY;  Surgeon: Jene Every, MD;  Location: [facility];  Service: Orthopedics;  Laterality: Right;   TOTAL SHOULDER ARTHROPLASTY     VAGINOPLASTY  1975   Family History  Problem Relation Age of Onset   Atrial fibrillation Mother    Heart disease Father    Heart attack Father    Atrial fibrillation Brother    Heart attack Paternal Uncle    Breast cancer Maternal Grandmother    Stomach cancer Maternal Grandfather    Cancer Paternal Grandmother        stomach   Cancer Other        Breast   Outpatient Medications Prior to Visit  Medication Sig Dispense Refill   acetaminophen (TYLENOL) 500 MG tablet Take 1,000 mg by mouth every 6 (six) hours as needed for moderate pain.     allopurinol (ZYLOPRIM) 300 MG tablet Take 300 mg by mouth daily.   2   Bismuth Subgallate 200 MG CHEW Chew 400 mg by mouth in the morning, at noon, in the evening, and at bedtime.      cholecalciferol (VITAMIN D3) 25 MCG (1000 UNIT) tablet Take 1,000 Units by mouth daily.     furosemide (LASIX) 20 MG tablet Take 1 tablet (20 mg total) by mouth daily. 90 tablet 3   losartan (COZAAR) 50 MG tablet TAKE 1 TABLET BY MOUTH DAILY (Patient taking differently: Take 50 mg by mouth daily. 1/2 tablet daily) 90 tablet 3   melatonin 3 MG TABS tablet Take 10 mg by mouth.      metoprolol succinate (TOPROL-XL) 50 MG 24 hr tablet Take 1 tablet (50 mg total) by mouth daily. 90 tablet 3   potassium chloride SA (K-DUR,KLOR-CON) 20 MEQ tablet Take 1 tablet (20 mEq total) by mouth daily. 30 tablet 0   Probiotic Product (PROBIOTIC DAILY PO) Take 1 capsule by mouth daily.      trimethoprim (TRIMPEX) 100 MG tablet Take 100 mg by mouth daily.     No facility-administered medications prior to visit.   Allergies  Allergen Reactions   Bactrim [Sulfamethoxazole-Trimethoprim] Nausea And Vomiting   Demerol Nausea And Vomiting   Dilaudid  [Hydromorphone Hcl]    Dilaudid [Hydromorphone Hcl] Nausea And Vomiting and Other (See Comments)    Muscle cramping   Reglan [Metoclopramide] Other (See Comments)    Cramps in hands   Stadol [Butorphanol Tartrate] Nausea And Vomiting  Injection   Cephalexin Rash   Penicillins Rash    Rash only; note also rash to Keflex Has patient had a PCN reaction causing immediate rash, facial/tongue/throat swelling, SOB or lightheadedness with hypotension: rash Has patient had a PCN reaction causing severe rash involving mucus membranes or skin necrosis: no Has patient had a PCN reaction that required hospitalization no Has patient had a PCN reaction occurring within the last 10 years: no If all of the above answers are "NO", then may proceed with Cephalosporin use.     Objective:   Today's Vitals   07/23/23 1253  BP: 120/70  Pulse: (!) 108  Temp: 98.2 F (36.8 C)  TempSrc: Temporal  SpO2: 98%  Weight: 182 lb 12.8 oz (82.9 kg)  Height: 5\' 3"  (1.6 m)   Body mass index is 32.38 kg/m.   General: Well developed, well nourished. No acute distress. HEENT: Normocephalic, non-traumatic. PERRL, EOMI. Conjunctiva clear. External ears normal. EAC and   TMs normal bilaterally. Nose clear without congestion or rhinorrhea. Mucous membranes moist.   Oropharynx clear. Good dentition. Neck: Supple. No lymphadenopathy. No thyromegaly. Lungs: Clear to  auscultation bilaterally. No wheezing, rales or rhonchi. CV: RRR without murmurs or rubs. Pulses 2+ bilaterally. Abdomen: Soft, non-tender. Bowel sounds positive, normal pitch and frequency. No hepatosplenomegaly.   No rebound or guarding. Ileostomy appliance in place. Extremities: Limited ROM of shoudlers. No joint swelling or tenderness. No edema noted. Skin: Warm and dry. There is a 4-5 cm smooth, freely mobile subcutaneous mass overlying the left   shoulder c/w a lipoma. Psych: Alert and oriented. Normal mood and affect.  Health Maintenance Due  Topic Date Due   Hepatitis C Screening  Never done   DTaP/Tdap/Td (1 - Tdap) Never done   Zoster Vaccines- Shingrix (1 of 2) Never done     EKG: Normal sinus rhythm (rate = 95)  Assessment & Plan:   Problem List Items Addressed This Visit       Cardiovascular and Mediastinum   Essential hypertension   Blood pressure is in good control. Continue losartan 25 mg 1/2 tab daily and metoprolol succinate 50 mg daily. I will assess renal function today.      Relevant Orders   Comprehensive metabolic panel with GFR     Musculoskeletal and Integument   Primary osteoarthritis of both shoulders   Pending MRI to assess right shoulder. Orthopedics plans eventual bilateral shoulder joint arthoplasty.        Genitourinary   Stage 3b chronic kidney disease (CKD) (HCC)   I will reassess renal function today. BP is in good control.       Relevant Orders   CBC   Comprehensive metabolic panel with GFR     Other   Ileostomy in place, secondary to ulcerative colitis x 20 years (Chronic)   Stable. Functioning well.      Other Visit Diagnoses       Preoperative examination    -  Primary   I will cehck labs to assess status of blood counts, renal function, and liver health. If normal, anticipate clearance for surgery.   Relevant Orders   CBC   Comprehensive metabolic panel with GFR   EKG [id number] (Completed)       Return in about 3  months (around 10/22/2023).   Loyola Mast, MD

## 2023-07-23 NOTE — Assessment & Plan Note (Signed)
 Blood pressure is in good control. Continue losartan 25 mg 1/2 tab daily and metoprolol succinate 50 mg daily. I will assess renal function today.

## 2023-07-23 NOTE — Assessment & Plan Note (Signed)
 Stable. Functioning well.

## 2023-07-23 NOTE — Assessment & Plan Note (Signed)
 Pending MRI to assess right shoulder. Orthopedics plans eventual bilateral shoulder joint arthoplasty.

## 2023-07-25 ENCOUNTER — Telehealth: Payer: Self-pay | Admitting: Family Medicine

## 2023-07-25 NOTE — Telephone Encounter (Signed)
 Copied from CRM [id number]. Topic: General - Other >> Jul 25, 2023 10:58 AM Gurney Maxin H wrote: Reason for CRM: Keri with [facility] states she's returning a call to [place] regarding patient states she didn't receive form that Angelique Blonder was calling regarding, please re-fax to number below, thanks.  Lorina Rabon [phone] fax >> Jul 25, 2023 11:08 AM Einar Gip wrote: Please see message.

## 2023-07-25 NOTE — Telephone Encounter (Signed)
 Refaxing clearance form, EKG, labs to [phone].  Dm/cma

## 2023-07-30 ENCOUNTER — Encounter: Payer: Self-pay | Admitting: Specialist

## 2023-07-31 ENCOUNTER — Ambulatory Visit
Admission: RE | Admit: 2023-07-31 | Discharge: 2023-07-31 | Disposition: A | Discharge status: Home / Self Care | Source: Ambulatory Visit | Attending: Physician Assistant | Admitting: Physician Assistant

## 2023-07-31 DIAGNOSIS — M25511 Pain in right shoulder: Secondary | ICD-10-CM

## 2023-08-21 NOTE — H&P (Signed)
 Patient's anticipated LOS is less than 2 midnights, meeting these requirements: - Younger than 61 - Lives within 1 hour of care - Has a competent adult at home to recover with post-op recover - NO history of  - Chronic pain requiring opiods  - Diabetes  - Coronary Artery Disease  - Heart failure  - Heart attack  - Stroke  - DVT/VTE  - Cardiac arrhythmia  - Respiratory Failure/COPD  - Renal failure  - Anemia  - Advanced Liver disease     Deanna Burke is an 73 y.o. female.    Chief Complaint: right shoulder pain  HPI: Pt is a 73 y.o. female complaining of right shoulder pain for multiple years. Pain had continually increased since the beginning. X-rays in the clinic show end-stage arthritic changes of the right shoulder. Pt has tried various conservative treatments which have failed to alleviate their symptoms, including injections and therapy. Various options are discussed with the patient. Risks, benefits and expectations were discussed with the patient. Patient understand the risks, benefits and expectations and wishes to proceed with surgery.   PCP:  Graig Lawyer, MD  D/C Plans: Home  PMH: Past Medical History:  Diagnosis Date   Abnormal finding on cardiovascular stress test, ischemia anterolateral 02/13/2012   follow heart cath - pt told one tiny blockage that didn't even matter   Anemia 1/2/-21   Arthritis    hands, lumbar spine, hips, right ankle   Breast cancer (HCC)    Cancer (HCC)    DCIS L breast   Cataract    Left Eye   Complication of anesthesia 1996   pt has ileostomy and had surgery in 1996 that paralyzed   Fatigue    H/O nephrostomy 08/13/12   H/O ulcerative colitis    History of bone density study 2012   History of kidney stones 2015   nephrectomy   History of recurrent UTIs 02/13/2012   Hot flashes    Hypertension    Ileostomy in place, secondary to ulcerative colitis x 20 years 02/13/2012   Kidney disease, 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  ER:+, PR+,    BREAST SURGERY  05/03/11   LEFT BREAST NEEDLE CORE BIOPSY- DCIS   CARDIAC CATHETERIZATION     CATARACT EXTRACTION Left    CHOLECYSTECTOMY N/A 06/19/2019   Procedure: LAPAROSCOPIC CHOLECYSTECTOMY;  Surgeon: Oza Blumenthal, MD;  Location: [facility];  Service: General;  Laterality: N/A;   COLECTOMY  1973   EXPLORATORY LAPAROTOMY  1978/1990   with lysis of adhesions  exploratory laps     several due to abd pain related to ulcerative colitis   EYE SURGERY  05/08/2011   left cataract removal    ILEOSTOMY  1973   IR GENERIC HISTORICAL  03/08/2016   IR VENOCAVAGRAM IVC 03/08/2016 Marland Silvas, MD [facility]   IR GENERIC HISTORICAL  03/08/2016   IR US  GUIDE VASC ACCESS RIGHT 03/08/2016 Marland Silvas, MD [facility]   JOINT REPLACEMENT     KNEE ARTHROPLASTY     LAPAROSCOPIC NEPHRECTOMY Right 08/14/2012   Procedure: RIGHT LAPAROSCOPIC RETROPERITONEAL LAPAROSCOPIC NEPHRECTOMY ;  Surgeon: Osborn Blaze, MD;  Location: [facility];  Service: Urology;  Laterality: Right;  RIGHT LAPAROSCOPIC RETROPERITONEAL LAPAROSCOPIC NEPHRECTOMY, POSSIBLE OPEN    LEFT HEART  CATHETERIZATION WITH CORONARY ANGIOGRAM N/A 02/13/2012   Procedure: LEFT HEART CATHETERIZATION WITH CORONARY ANGIOGRAM;  Surgeon: Avanell Leigh, MD;  Location: [facility] CATH LAB;  Service: Cardiovascular;  Laterality: N/A;   ROTATOR CUFF REPAIR  2006   Right   TOTAL KNEE ARTHROPLASTY Left 04/01/2015   Procedure: LEFT TOTAL KNEE ARTHROPLASTY;  Surgeon: Orvan Blanch, MD;  Location: [facility];  Service: Orthopedics;  Laterality: Left;   TOTAL KNEE ARTHROPLASTY Right 11/04/2015   Procedure: RIGHT TOTAL KNEE ARTHROPLASTY;  Surgeon: Orvan Blanch, MD;  Location: [facility];  Service: Orthopedics;  Laterality: Right;   TOTAL SHOULDER ARTHROPLASTY     VAGINOPLASTY  1975    Social History:  reports that she has never smoked. She has never used smokeless tobacco. She reports that she does not drink alcohol and does not use drugs. BMI: Estimated body mass index is 32.38 kg/m as calculated from the following:   Height as of 07/23/23: 5\' 3"  (1.6 m).   Weight as of 07/23/23: 82.9 kg.  Lab Results  Component Value Date   ALBUMIN 4.5 07/23/2023   Diabetes: Patient does not have a diagnosis of diabetes.     Smoking Status:   reports that she has never smoked. She has never used smokeless tobacco.    Allergies:  Allergies  Allergen Reactions   Bactrim  [Sulfamethoxazole -Trimethoprim ] Nausea And Vomiting   Demerol  Nausea And Vomiting   Dilaudid   [Hydromorphone  Hcl]    Dilaudid  [Hydromorphone  Hcl] Nausea And Vomiting and Other (See Comments)    Muscle cramping   Reglan  [Metoclopramide ] Other (See Comments)    Cramps in hands   Stadol [Butorphanol Tartrate] Nausea And Vomiting    Injection   Cephalexin Rash   Penicillins Rash    Rash only; note also rash to Keflex Has patient had a PCN reaction causing immediate rash, facial/tongue/throat swelling, SOB or lightheadedness with hypotension: rash Has patient had a PCN reaction causing severe rash involving mucus membranes or skin necrosis: no Has patient  had a PCN reaction that required hospitalization no Has patient had a PCN reaction occurring within the last 10 years: no If all of the above answers are "NO", then may proceed with Cephalosporin use.    Medications: No current facility-administered medications for this encounter.   Current Outpatient Medications  Medication Sig Dispense Refill   acetaminophen  (TYLENOL ) 500 MG tablet Take 1,000 mg by mouth every 6 (six) hours as needed for moderate pain.     allopurinol  (ZYLOPRIM ) 300 MG tablet Take 300 mg by mouth daily.   2   Bismuth  Subgallate 200 MG CHEW Chew 400 mg by mouth in the morning, at noon, in the evening, and at bedtime.      cholecalciferol (VITAMIN D3) 25 MCG (1000 UNIT) tablet  Take 1,000 Units by mouth daily.     furosemide  (LASIX ) 20 MG tablet Take 1 tablet (20 mg total) by mouth daily. 90 tablet 3   losartan (COZAAR) 50 MG tablet TAKE 1 TABLET BY MOUTH DAILY (Patient taking differently: Take 50 mg by mouth daily. 1/2 tablet daily) 90 tablet 3   melatonin 3 MG TABS tablet Take 10 mg by mouth.     metoprolol  succinate (TOPROL -XL) 50 MG 24 hr tablet Take 1 tablet (50 mg total) by mouth daily. 90 tablet 3   potassium chloride  SA (K-DUR,KLOR-CON ) 20 MEQ tablet Take 1 tablet (20 mEq total) by mouth daily. 30 tablet 0   Probiotic Product (PROBIOTIC DAILY PO) Take 1 capsule by mouth daily.      trimethoprim  (TRIMPEX ) 100 MG tablet Take 100 mg by mouth daily.      No results found for this or any previous visit (from the past 48 hours). No results found.  ROS: Pain with rom of the right upper extremity  Physical Exam: Alert and oriented 73 y.o. female in no acute distress Cranial nerves 2-12 intact Cervical spine: full rom with no tenderness, nv intact distally Chest: active breath sounds bilaterally, no wheeze rhonchi or rales Heart: regular rate and rhythm, no murmur Abd: non tender non distended with active bowel sounds Hip is stable with rom  Right shoulder painful  and weak rom Nv intact distally No rashes or edema distally  Assessment/Plan Assessment: right shoulder cuff arthropathy  Plan:  Patient will undergo a right reverse total shoulder by Dr. Brunilda Capra at [facility] Risks benefits and expectations were discussed with the patient. Patient understand risks, benefits and expectations and wishes to proceed. Preoperative templating of the joint replacement has been completed, documented, and submitted to the Operating Room personnel in order to optimize intra-operative equipment management.   Lorina Roosevelt PA-C, MPAS [facility] is now [employer] [address], [address], [place], [place] [address] Phone: [phone] [email or web address] Facebook  [employer]

## 2023-08-24 ENCOUNTER — Telehealth: Payer: Self-pay

## 2023-08-24 ENCOUNTER — Encounter (HOSPITAL_COMMUNITY): Payer: Self-pay

## 2023-08-24 DIAGNOSIS — M109 Gout, unspecified: Secondary | ICD-10-CM

## 2023-08-24 NOTE — Telephone Encounter (Signed)
 Last Ov 07/23/23 Filled by historical provider   Copied from CRM [id number]. Topic: Clinical - Medication Question >> Aug 24, 2023  9:35 AM Bambi Bonine D wrote: Reason for CRM: Patient stated that she needs Dr.Rudd to create a new prescription for the allopurinol  (ZYLOPRIM ) 300 MG tablet. Patient stated that she called the pharmacy and they stated that they are unable to refill the subscription because it was with another provider and needs to have a new prescription made.

## 2023-08-27 NOTE — Progress Notes (Signed)
 COVID Vaccine received:  []  No [x]  Yes Date of any COVID positive Test in last 90 days: no PCP - Remo Carls MD Cardiologist - n/a  Chest x-ray -  EKG -  07/23/23 Epic Stress Test -  ECHO -  Cardiac Cath - 02/13/12 Epic  Bowel Prep - [x]  No  []   Yes ______  Pacemaker / ICD device [x]  No []  Yes   Spinal Cord Stimulator:[x]  No []  Yes       History of Sleep Apnea? [x]  No []  Yes   CPAP used?- [x]  No []  Yes    Does the patient monitor blood sugar?          [x]  No []  Yes  []  N/A  Patient has: [x]  NO Hx DM   []  Pre-DM                 []  DM1  []   DM2 Does patient have a [employer] or [employer]? []  No []  Yes   Fasting Blood Sugar Ranges-  Checks Blood Sugar _____ times a day  GLP1 agonist / usual dose - no GLP1 instructions:  SGLT-2 inhibitors / usual dose - no SGLT-2 instructions:   Blood Thinner / Instructions:no Aspirin  Instructions:no  Comments:   Activity level: Patient is able  to climb a flight of stairs without difficulty; [x]  No CP  []  No SOB,   Patient can  perform ADLs without assistance.   Anesthesia review: CKD-has 1 kidney, HTN  Patient denies shortness of breath, fever, cough and chest pain at PAT appointment.  Patient verbalized understanding and agreement to the Pre-Surgical Instructions that were given to them at this PAT appointment. Patient was also educated of the need to review these PAT instructions again prior to his/her surgery.I reviewed the appropriate phone numbers to call if they have any and questions or concerns.

## 2023-08-27 NOTE — Patient Instructions (Signed)
 SURGICAL WAITING ROOM VISITATION  Patients having surgery or a procedure may have no more than 2 support people in the waiting area - these visitors may rotate.    Children under the age of 12 must have an adult with them who is not the patient.  Due to an increase in RSV and influenza rates and associated hospitalizations, children ages 12 and under may not visit patients in [facility] hospitals.  Visitors with respiratory illnesses are discouraged from visiting and should remain at home.  If the patient needs to stay at the hospital during part of their recovery, the visitor guidelines for inpatient rooms apply. Pre-op nurse will coordinate an appropriate time for 1 support person to accompany patient in pre-op.  This support person may not rotate.    Please refer to the [facility] website for the visitor guidelines for Inpatients (after your surgery is over and you are in a regular room).       Your procedure is scheduled on: 09/05/23   Report to [facility] Main Entrance    Report to admitting at 10:15 AM   Call this number if you have problems the morning of surgery [phone]   Do not eat food :After Midnight.   After Midnight you may have the following liquids until 9:45 AM DAY OF SURGERY  Water  Non-Citrus Juices (without pulp, NO RED-Apple, White grape, White cranberry) Black Coffee (NO MILK/CREAM OR CREAMERS, sugar ok)  Clear Tea (NO MILK/CREAM OR CREAMERS, sugar ok) regular and decaf                             Plain Jell-O (NO RED)                                           Fruit ices (not with fruit pulp, NO RED)                                     Popsicles (NO RED)                                                               Sports drinks like Gatorade (NO RED)                The day of surgery:  Drink ONE (1) Pre-Surgery Clear Ensure at 9:45 AM the morning of surgery. Drink in one sitting. Do not sip.  This drink was given to you during your hospital   pre-op appointment visit. Nothing else to drink after completing the  Pre-Surgery Clear Ensure .     Oral Hygiene is also important to reduce your risk of infection.                                    Remember - BRUSH YOUR TEETH THE MORNING OF SURGERY WITH YOUR REGULAR TOOTHPASTE  DENTURES WILL BE REMOVED PRIOR TO SURGERY PLEASE DO NOT APPLY "Poly grip" OR ADHESIVES!!!   Stop all vitamins  and herbal supplements 7 days before surgery.   Take these medicines the morning of surgery with A SIP OF WATER : allopurinol , tylenol , metoprolol .  Bring CPAP mask and tubing day of surgery.                              You may not have any metal on your body including hair pins, jewelry, and body piercing             Do not wear make-up, lotions, powders, perfumes/cologne, or deodorant  Do not wear nail polish including gel and S&S, artificial/acrylic nails, or any other type of covering on natural nails including finger and toenails. If you have artificial nails, gel coating, etc. that needs to be removed by a nail salon please have this removed prior to surgery or surgery may need to be canceled/ delayed if the surgeon/ anesthesia feels like they are unable to be safely monitored.   Do not shave  48 hours prior to surgery.               Men may shave face and neck.   Do not bring valuables to the hospital. [place] IS NOT             RESPONSIBLE   FOR VALUABLES.   Contacts, glasses, dentures or bridgework may not be worn into surgery.   Bring small overnight bag day of surgery.   DO NOT BRING YOUR HOME MEDICATIONS TO THE HOSPITAL. PHARMACY WILL DISPENSE MEDICATIONS LISTED ON YOUR MEDICATION LIST TO YOU DURING YOUR ADMISSION IN THE HOSPITAL!    Patients discharged on the day of surgery will not be allowed to drive home.  Someone NEEDS to stay with you for the first 24 hours after anesthesia.   Special Instructions: Bring a copy of your healthcare power of attorney and living will  documents the day of surgery if you haven't scanned them before.              Please read over the following fact sheets you were given: IF YOU HAVE QUESTIONS ABOUT YOUR PRE-OP INSTRUCTIONS PLEASE CALL [phone] Ammon Bales   If you received a COVID test during your pre-op visit  it is requested that you wear a mask when out in public, stay away from anyone that may not be feeling well and notify your surgeon if you develop symptoms. If you test positive for Covid or have been in contact with anyone that has tested positive in the last 10 days please notify you surgeon.      Pre-operative 5 CHG Bath Instructions   You can play a key role in reducing the risk of infection after surgery. Your skin needs to be as free of germs as possible. You can reduce the number of germs on your skin by washing with CHG (chlorhexidine  gluconate) soap before surgery. CHG is an antiseptic soap that kills germs and continues to kill germs even after washing.   DO NOT use if you have an allergy to chlorhexidine /CHG or antibacterial soaps. If your skin becomes reddened or irritated, stop using the CHG and notify one of our RNs at [phone].   Please shower with the CHG soap starting 4 days before surgery using the following schedule:     Please keep in mind the following:  DO NOT shave, including legs and underarms, starting the day of your first shower.   You may shave your face at any  point before/day of surgery.  Place clean sheets on your bed the day you start using CHG soap. Use a clean washcloth (not used since being washed) for each shower. DO NOT sleep with pets once you start using the CHG.   CHG Shower Instructions:  If you choose to wash your hair and private area, wash first with your normal shampoo/soap.  After you use shampoo/soap, rinse your hair and body thoroughly to remove shampoo/soap residue.  Turn the water  OFF and apply about 3 tablespoons (45 ml) of CHG soap to a CLEAN washcloth.   Apply CHG soap ONLY FROM YOUR NECK DOWN TO YOUR TOES (washing for 3-5 minutes)  DO NOT use CHG soap on face, private areas, open wounds, or sores.  Pay special attention to the area where your surgery is being performed.  If you are having back surgery, having someone wash your back for you may be helpful. Wait 2 minutes after CHG soap is applied, then you may rinse off the CHG soap.  Pat dry with a clean towel  Put on clean clothes/pajamas   If you choose to wear lotion, please use ONLY the CHG-compatible lotions on the back of this paper.     Additional instructions for the day of surgery: DO NOT APPLY any lotions, deodorants, cologne, or perfumes.   Put on clean/comfortable clothes.  Brush your teeth.  Ask your nurse before applying any prescription medications to the skin.      CHG Compatible Lotions   Aveeno Moisturizing lotion  Cetaphil Moisturizing Cream  Cetaphil Moisturizing Lotion  Clairol Herbal Essence Moisturizing Lotion, Dry Skin  Clairol Herbal Essence Moisturizing Lotion, Extra Dry Skin  Clairol Herbal Essence Moisturizing Lotion, Normal Skin  Curel Age Defying Therapeutic Moisturizing Lotion with Alpha Hydroxy  Curel Extreme Care Body Lotion  Curel Soothing Hands Moisturizing Hand Lotion  Curel Therapeutic Moisturizing Cream, Fragrance-Free  Curel Therapeutic Moisturizing Lotion, Fragrance-Free  Curel Therapeutic Moisturizing Lotion, Original Formula  Eucerin Daily Replenishing Lotion  Eucerin Dry Skin Therapy Plus Alpha Hydroxy Crme  Eucerin Dry Skin Therapy Plus Alpha Hydroxy Lotion  Eucerin Original Crme  Eucerin Original Lotion  Eucerin Plus Crme Eucerin Plus Lotion  Eucerin TriLipid Replenishing Lotion  Keri Anti-Bacterial Hand Lotion  Keri Deep Conditioning Original Lotion Dry Skin Formula Softly Scented  Keri Deep Conditioning Original Lotion, Fragrance Free Sensitive Skin Formula  Keri Lotion Fast Absorbing Fragrance Free Sensitive Skin  Formula  Keri Lotion Fast Absorbing Softly Scented Dry Skin Formula  Keri Original Lotion  Keri Skin Renewal Lotion Keri Silky Smooth Lotion  Keri Silky Smooth Sensitive Skin Lotion  Nivea Body Creamy Conditioning Oil  Nivea Body Extra Enriched Lotion  Nivea Body Original Lotion  Nivea Body Sheer Moisturizing Lotion Nivea Crme  Nivea Skin Firming Lotion  NutraDerm 30 Skin Lotion  NutraDerm Skin Lotion  NutraDerm Therapeutic Skin Cream  NutraDerm Therapeutic Skin Lotion  ProShield Protective Hand Cream   Incentive Spirometer  An incentive spirometer is a tool that can help keep your lungs clear and active. This tool measures how well you are filling your lungs with each breath. Taking long deep breaths may help reverse or decrease the chance of developing breathing (pulmonary) problems (especially infection) following: A long period of time when you are unable to move or be active. BEFORE THE PROCEDURE  If the spirometer includes an indicator to show your best effort, your nurse or respiratory therapist will set it to a desired goal. If possible, sit up straight or  lean slightly forward. Try not to slouch. Hold the incentive spirometer in an upright position. INSTRUCTIONS FOR USE  Sit on the edge of your bed if possible, or sit up as far as you can in bed or on a chair. Hold the incentive spirometer in an upright position. Breathe out normally. Place the mouthpiece in your mouth and seal your lips tightly around it. Breathe in slowly and as deeply as possible, raising the piston or the ball toward the top of the column. Hold your breath for 3-5 seconds or for as long as possible. Allow the piston or ball to fall to the bottom of the column. Remove the mouthpiece from your mouth and breathe out normally. Rest for a few seconds and repeat Steps 1 through 7 at least 10 times every 1-2 hours when you are awake. Take your time and take a few normal breaths between deep breaths. The  spirometer may include an indicator to show your best effort. Use the indicator as a goal to work toward during each repetition. After each set of 10 deep breaths, practice coughing to be sure your lungs are clear. If you have an incision (the cut made at the time of surgery), support your incision when coughing by placing a pillow or rolled up towels firmly against it. Once you are able to get out of bed, walk around indoors and cough well. You may stop using the incentive spirometer when instructed by your caregiver.  RISKS AND COMPLICATIONS Take your time so you do not get dizzy or light-headed. If you are in pain, you may need to take or ask for pain medication before doing incentive spirometry. It is harder to take a deep breath if you are having pain. AFTER USE Rest and breathe slowly and easily. It can be helpful to keep track of a log of your progress. Your caregiver can provide you with a simple table to help with this. If you are using the spirometer at home, follow these instructions: SEEK MEDICAL CARE IF:  You are having difficultly using the spirometer. You have trouble using the spirometer as often as instructed. Your pain medication is not giving enough relief while using the spirometer. You develop fever of 100.5 F (38.1 C) or higher. SEEK IMMEDIATE MEDICAL CARE IF:  You cough up bloody sputum that had not been present before. You develop fever of 102 F (38.9 C) or greater. You develop worsening pain at or near the incision site. MAKE SURE YOU:  Understand these instructions. Will watch your condition. Will get help right away if you are not doing well or get worse.    Before surgery, you can play an important role. Because skin is not sterile, your skin needs to be as free of germs as possible. You can reduce the number of germs on your skin by using the following products. Benzoyl Peroxide Gel Reduces the number of germs present on the skin Applied twice a day to  shoulder area starting two days before surgery    ==================================================================  Please follow these instructions carefully:  BENZOYL PEROXIDE 5% GEL  Please do not use if you have an allergy to benzoyl peroxide.   If your skin becomes reddened/irritated stop using the benzoyl peroxide.  Starting two days before surgery, apply as follows: Apply benzoyl peroxide in the morning and at night. Apply after taking a shower. If you are not taking a shower clean entire shoulder front, back, and side along with the armpit with a clean  wet washcloth.  Place a quarter-sized dollop on your shoulder and rub in thoroughly, making sure to cover the front, back, and side of your shoulder, along with the armpit.   2 days before ____ AM   ____ PM              1 day before ____ AM   ____ PM                         Do this twice a day for two days.  (Last application is the night before surgery, AFTER using the CHG soap as described below).  Do NOT apply benzoyl peroxide gel on the day of surgery.

## 2023-08-28 ENCOUNTER — Encounter (HOSPITAL_COMMUNITY)
Admission: RE | Admit: 2023-08-28 | Discharge: 2023-08-28 | Disposition: A | Discharge status: Home / Self Care | Source: Ambulatory Visit | Attending: Orthopedic Surgery | Admitting: Orthopedic Surgery

## 2023-08-28 ENCOUNTER — Other Ambulatory Visit: Payer: Self-pay

## 2023-08-28 VITALS — BP 132/81 | HR 89 | Temp 98.4°F | Resp 16 | Ht 64.0 in | Wt 180.0 lb

## 2023-08-28 DIAGNOSIS — I10 Essential (primary) hypertension: Secondary | ICD-10-CM

## 2023-08-28 DIAGNOSIS — Z86711 Personal history of pulmonary embolism: Secondary | ICD-10-CM | POA: Diagnosis not present

## 2023-08-28 DIAGNOSIS — G4733 Obstructive sleep apnea (adult) (pediatric): Secondary | ICD-10-CM | POA: Insufficient documentation

## 2023-08-28 DIAGNOSIS — N189 Chronic kidney disease, unspecified: Secondary | ICD-10-CM | POA: Insufficient documentation

## 2023-08-28 DIAGNOSIS — M12811 Other specific arthropathies, not elsewhere classified, right shoulder: Secondary | ICD-10-CM | POA: Insufficient documentation

## 2023-08-28 DIAGNOSIS — Z853 Personal history of malignant neoplasm of breast: Secondary | ICD-10-CM | POA: Diagnosis not present

## 2023-08-28 DIAGNOSIS — Z01812 Encounter for preprocedural laboratory examination: Secondary | ICD-10-CM | POA: Insufficient documentation

## 2023-08-28 DIAGNOSIS — Z923 Personal history of irradiation: Secondary | ICD-10-CM | POA: Diagnosis not present

## 2023-08-28 DIAGNOSIS — I129 Hypertensive chronic kidney disease with stage 1 through stage 4 chronic kidney disease, or unspecified chronic kidney disease: Secondary | ICD-10-CM | POA: Diagnosis not present

## 2023-08-28 DIAGNOSIS — Z01818 Encounter for other preprocedural examination: Secondary | ICD-10-CM

## 2023-08-28 LAB — CBC
HCT: 43.7 % (ref 36.0–46.0)
Hemoglobin: 14 g/dL (ref 12.0–15.0)
MCH: 30.8 pg (ref 26.0–34.0)
MCHC: 32 g/dL (ref 30.0–36.0)
MCV: 96 fL (ref 80.0–100.0)
Platelets: 270 10*3/uL (ref 150–400)
RBC: 4.55 MIL/uL (ref 3.87–5.11)
RDW: 13.8 % (ref 11.5–15.5)
WBC: 5.9 10*3/uL (ref 4.0–10.5)
nRBC: 0 % (ref 0.0–0.2)

## 2023-08-28 LAB — BASIC METABOLIC PANEL WITH GFR
Anion gap: 11 (ref 5–15)
BUN: 29 mg/dL — ABNORMAL HIGH (ref 8–23)
CO2: 20 mmol/L — ABNORMAL LOW (ref 22–32)
Calcium: 9.8 mg/dL (ref 8.9–10.3)
Chloride: 107 mmol/L (ref 98–111)
Creatinine, Ser: 1.19 mg/dL — ABNORMAL HIGH (ref 0.44–1.00)
GFR, Estimated: 48 mL/min — ABNORMAL LOW (ref >60)
Glucose, Bld: 92 mg/dL (ref 70–99)
Potassium: 4.1 mmol/L (ref 3.5–5.1)
Sodium: 138 mmol/L (ref 135–145)

## 2023-08-28 LAB — SURGICAL PCR SCREEN
MRSA, PCR: NEGATIVE
Staphylococcus aureus: NEGATIVE

## 2023-08-28 MED ORDER — SODIUM CHLORIDE (PF) 0.9 % IJ SOLN
INTRAMUSCULAR | Status: AC
  Filled 2023-08-28: qty 50

## 2023-08-29 ENCOUNTER — Encounter (HOSPITAL_COMMUNITY): Payer: Self-pay

## 2023-08-29 NOTE — Anesthesia Preprocedure Evaluation (Signed)
 Anesthesia Evaluation    Airway        Dental   Pulmonary           Cardiovascular hypertension,      Neuro/Psych    GI/Hepatic   Endo/Other    Renal/GU      Musculoskeletal   Abdominal   Peds  Hematology   Anesthesia Other Findings   Reproductive/Obstetrics                              Anesthesia Physical Anesthesia Plan  ASA:   Anesthesia Plan:    Post-op Pain Management:    Induction:   PONV Risk Score and Plan:   Airway Management Planned:   Additional Equipment:   Intra-op Plan:   Post-operative Plan:   Informed Consent:   Plan Discussed with:   Anesthesia Plan Comments: (See PAT note from 5/6)         Anesthesia Quick Evaluation

## 2023-08-29 NOTE — Progress Notes (Signed)
 Case: [id number] Date/Time: 09/05/23 1230   Procedure: ARTHROPLASTY, SHOULDER, TOTAL, REVERSE (Right: Shoulder)   Anesthesia type: Choice   Pre-op diagnosis: RIGHT shoulder rotator cuff arthropathy   Location: [facility] 06 / [facility]   Surgeons: Winston Hawking, MD       DISCUSSION: Deanna Burke is a 73 yo female who presents to PAT prior to surgery above. PMH of HTN, hx of PE (remotely in 1970s), OSA (uses CPAP), UC s/p ileostomy, CKD, breast cancer s/p lumpectomy and XRT.  Seen by PCP on 07/23/23 for pre op clearance. BP controlled. CKD stable. Cleared from medical/cardiac perspective (scanned in media on 07/27/23)  Patient was seen by Cardiology in the past for chest pain. She had an abnormal stress test and subsequently underwent a cath on 02/13/2012 which showed normal coronaries. Has not needed ongoing cardiology f/u.   VS: BP 132/81   Pulse 89   Temp 36.9 C (Oral)   Resp 16   Ht 5\' 4"  (1.626 m)   Wt 81.6 kg   SpO2 96%   BMI 30.90 kg/m   PROVIDERS: Graig Lawyer, MD   LABS: Labs reviewed: Acceptable for surgery. CKD stable (all labs ordered are listed, but only abnormal results are displayed)  Labs Reviewed  BASIC METABOLIC PANEL WITH GFR - Abnormal; Notable for the following components:      Result Value   CO2 20 (*)    BUN 29 (*)    Creatinine, Ser 1.19 (*)    GFR, Estimated 48 (*)    All other components within normal limits  SURGICAL PCR SCREEN  CBC     IMAGES:   EKG 07/23/23:  NSR, rate 95  CV:  LHC 02/13/2012:  ANGIOGRAPHIC RESULTS:    1. Left main; normal  2. LAD; fluoroscopically calcified in its proximal segment without any obstructive disease. 3. Left circumflex; dominant with a 50% ostial OM1 stenosis.  4. Right coronary artery; nondominant and free of significant disease 5. Left ventriculography; RAO left ventriculogram was performed using  25 mL of Visipaque dye at 12 mL/second. The overall LVEF estimated  60 %  With/Without wall motion  abnormalities   IMPRESSION:[name] has essentially normal coronary arteries and normal left ventricular function. I do not think her OM1 ostial stenosis is physiologically significant. I think that her Myoview was false positive and her symptoms noncardiac.  Past Medical History:  Diagnosis Date   Abnormal finding on cardiovascular stress test, ischemia anterolateral 02/13/2012   follow heart cath - pt told one tiny blockage that didn't even matter   Anemia 1/2/-21   Arthritis    hands, lumbar spine, hips, right ankle   Breast cancer (HCC)    Cancer (HCC)    DCIS L breast   Cataract    Left Eye   Complication of anesthesia 1996   pt has ileostomy and had surgery in 1996 that paralyzed   Fatigue    H/O nephrostomy 08/13/12   H/O ulcerative colitis    History of bone density study 2012   History of kidney stones 2015   nephrectomy   History of recurrent UTIs 02/13/2012   Hot flashes    Hypertension    Ileostomy in place, secondary to ulcerative colitis x 20 years 02/13/2012   Kidney disease, 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 anginal equivilant 02/13/2012   Torn rotator cuff 2005   Ulcerative colitis 1969-1973    Past Surgical History:  Procedure Laterality Date   ABDOMINAL HYSTERECTOMY  1996   partial   APPENDECTOMY     BREAST BIOPSY     BREAST LUMPECTOMY  05/31/11   LEFT BREAST LUMPECTOMY, NEGATIVE MARGINS, HIGH GRADE  DUCTAL  CARCINOMA IN SITU WITH ASSOCIATED CALCIFICATIONS.  ER:+, PR+,    BREAST SURGERY  05/03/11   LEFT  BREAST NEEDLE CORE BIOPSY- DCIS   CARDIAC CATHETERIZATION     CATARACT EXTRACTION Left    CHOLECYSTECTOMY N/A 06/19/2019   Procedure: LAPAROSCOPIC CHOLECYSTECTOMY;  Surgeon: Oza Blumenthal, MD;  Location: [facility];  Service: General;  Laterality: N/A;   COLECTOMY  1973   EXPLORATORY LAPAROTOMY  1978/1990   with lysis of adhesions   exploratory laps     several due to abd pain related to ulcerative colitis   EYE SURGERY  05/08/2011   left cataract removal    ILEOSTOMY  1973   IR GENERIC HISTORICAL  03/08/2016   IR VENOCAVAGRAM IVC 03/08/2016 Marland Silvas, MD [facility]   IR GENERIC HISTORICAL  03/08/2016   IR US  GUIDE VASC ACCESS RIGHT 03/08/2016 Marland Silvas, MD [facility]   JOINT REPLACEMENT     KNEE ARTHROPLASTY     LAPAROSCOPIC NEPHRECTOMY Right 08/14/2012   Procedure: RIGHT LAPAROSCOPIC RETROPERITONEAL LAPAROSCOPIC NEPHRECTOMY ;  Surgeon: Osborn Blaze, MD;  Location: [facility];  Service: Urology;  Laterality: Right;  RIGHT LAPAROSCOPIC RETROPERITONEAL LAPAROSCOPIC NEPHRECTOMY, POSSIBLE OPEN    LEFT HEART CATHETERIZATION WITH CORONARY ANGIOGRAM N/A 02/13/2012   Procedure: LEFT HEART CATHETERIZATION WITH CORONARY ANGIOGRAM;  Surgeon: Avanell Leigh, MD;  Location: [facility] C[facility] LAB;  Service: Cardiovascular;  Laterality: N/A;   ROTATOR CUFF REPAIR  2006   Right   TOTAL KNEE ARTHROPLASTY Left 04/01/2015   Procedure: LEFT TOTAL KNEE ARTHROPLASTY;  Surgeon: Orvan Blanch, MD;  Location: [facility];  Service: Orthopedics;  Laterality: Left;   TOTAL KNEE ARTHROPLASTY Right 11/04/2015   Procedure: RIGHT TOTAL KNEE ARTHROPLASTY;  Surgeon: Orvan Blanch, MD;  Location: [facility];  Service: Orthopedics;  Laterality: Right;   TOTAL SHOULDER ARTHROPLASTY     VAGINOPLASTY  1975    MEDICATIONS:  acetaminophen  (TYLENOL ) 650 MG CR tablet   allopurinol  (ZYLOPRIM ) 300 MG tablet   Bismuth  Subgallate 200 MG CHEW   cholecalciferol (VITAMIN D3) 25 MCG (1000 UNIT) tablet   furosemide  (LASIX ) 20 MG  tablet   losartan (COZAAR) 50 MG tablet   Melatonin 10 MG TABS   metoprolol  succinate (TOPROL -XL) 50 MG 24 hr tablet   potassium chloride  SA (K-DUR,KLOR-CON ) 20 MEQ tablet   Probiotic Product (PROBIOTIC DAILY PO)   trimethoprim  (TRIMPEX ) 100 MG tablet   No current facility-administered medications for this encounter.

## 2023-09-03 MED ORDER — ALLOPURINOL 300 MG PO TABS: 300.0000 mg | ORAL_TABLET | Freq: Every day | ORAL | 3 refills | Status: AC

## 2023-09-03 NOTE — Telephone Encounter (Signed)
 Patient is calling in regarding her medication request she would like a call back regarding this

## 2023-09-03 NOTE — Addendum Note (Signed)
 Addended by: Graig Lawyer on: 09/03/2023 10:50 AM   Modules accepted: Orders

## 2023-09-05 ENCOUNTER — Encounter (HOSPITAL_COMMUNITY): Payer: Self-pay | Admitting: Medical

## 2023-09-05 ENCOUNTER — Ambulatory Visit (HOSPITAL_COMMUNITY): Admission: RE | Admit: 2023-09-05 | Source: Home / Self Care | Admitting: Orthopedic Surgery

## 2023-09-05 ENCOUNTER — Encounter (HOSPITAL_COMMUNITY): Admission: RE | Payer: Self-pay | Source: Home / Self Care

## 2023-09-05 SURGERY — ARTHROPLASTY, SHOULDER, TOTAL, REVERSE
Anesthesia: Choice | Site: Shoulder | Laterality: Right

## 2023-09-19 NOTE — H&P (Signed)
 Patient's anticipated LOS is less than 2 midnights, meeting these requirements: - Younger than 36 - Lives within 1 hour of care - Has a competent adult at home to recover with post-op recover - NO history of  - Chronic pain requiring opiods  - Diabetes  - Coronary Artery Disease  - Heart failure  - Heart attack  - Stroke  - DVT/VTE  - Cardiac arrhythmia  - Respiratory Failure/COPD  - Renal failure  - Anemia  - Advanced Liver disease     Deanna Burke is an 73 y.o. female.    Chief Complaint: right shoulder pain  HPI: Pt is a 72 y.o. female complaining of right shoulder pain for multiple years. Pain had continually increased since the beginning. X-rays in the clinic show end-stage arthritic changes of the right shoulder. Pt has tried various conservative treatments which have failed to alleviate their symptoms, including injections and therapy. Various options are discussed with the patient. Risks, benefits and expectations were discussed with the patient. Patient understand the risks, benefits and expectations and wishes to proceed with surgery.   PCP:  Graig Lawyer, MD  D/C Plans: Home  PMH: Past Medical History:  Diagnosis Date   Abnormal finding on cardiovascular stress test, ischemia anterolateral 02/13/2012   follow heart cath - pt told one tiny blockage that didn't even matter   Anemia 1/2/-21   Arthritis    hands, lumbar spine, hips, right ankle   Breast cancer (HCC)    Cancer (HCC)    DCIS L breast   Cataract    Left Eye   Complication of anesthesia 1996   pt has ileostomy and had surgery in 1996 that paralyzed   Fatigue    H/O nephrostomy 08/13/12   H/O ulcerative colitis    History of bone density study 2012   History of kidney stones 2015   nephrectomy   History of recurrent UTIs 02/13/2012   Hot flashes    Hypertension    Ileostomy in place, secondary to ulcerative colitis x 20 years 02/13/2012   Kidney disease, 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  ER:+, PR+,    BREAST SURGERY  05/03/11   LEFT BREAST NEEDLE CORE BIOPSY- DCIS   CARDIAC CATHETERIZATION     CATARACT EXTRACTION Left    CHOLECYSTECTOMY N/A 06/19/2019   Procedure: LAPAROSCOPIC CHOLECYSTECTOMY;  Surgeon: Oza Blumenthal, MD;  Location: [facility];  Service: General;  Laterality: N/A;   COLECTOMY  1973   EXPLORATORY LAPAROTOMY  1978/1990   with lysis of adhesions  exploratory laps     several due to abd pain related to ulcerative colitis   EYE SURGERY  05/08/2011   left cataract removal    ILEOSTOMY  1973   IR GENERIC HISTORICAL  03/08/2016   IR VENOCAVAGRAM IVC 03/08/2016 Marland Silvas, MD [facility]   IR GENERIC HISTORICAL  03/08/2016   IR US  GUIDE VASC ACCESS RIGHT 03/08/2016 Marland Silvas, MD [facility]   JOINT REPLACEMENT     KNEE ARTHROPLASTY     LAPAROSCOPIC NEPHRECTOMY Right 08/14/2012   Procedure: RIGHT LAPAROSCOPIC RETROPERITONEAL LAPAROSCOPIC NEPHRECTOMY ;  Surgeon: Osborn Blaze, MD;  Location: [facility];  Service: Urology;  Laterality: Right;  RIGHT LAPAROSCOPIC RETROPERITONEAL LAPAROSCOPIC NEPHRECTOMY, POSSIBLE OPEN    LEFT HEART  CATHETERIZATION WITH CORONARY ANGIOGRAM N/A 02/13/2012   Procedure: LEFT HEART CATHETERIZATION WITH CORONARY ANGIOGRAM;  Surgeon: Avanell Leigh, MD;  [facility];  Service: Cardiovascular;  Laterality: N/A;   ROTATOR CUFF REPAIR  2006   Right   TOTAL KNEE ARTHROPLASTY Left 04/01/2015   Procedure: LEFT TOTAL KNEE ARTHROPLASTY;  Surgeon: Orvan Blanch, MD;  Location: [facility];  Service: Orthopedics;  Laterality: Left;   TOTAL KNEE ARTHROPLASTY Right 11/04/2015   Procedure: RIGHT TOTAL KNEE ARTHROPLASTY;  Surgeon: Orvan Blanch, MD;  Location: [facility];  Service: Orthopedics;  Laterality: Right;   TOTAL SHOULDER ARTHROPLASTY     VAGINOPLASTY  1975    Social History:  reports that she has never smoked. She has never used smokeless tobacco. She reports that she does not drink alcohol and does not use drugs. BMI: Estimated body mass index is 30.9 kg/m as calculated from the following:   Height as of 08/28/23: 5\' 4"  (1.626 m).   Weight as of 08/28/23: 81.6 kg.  Lab Results  Component Value Date   ALBUMIN 4.5 07/23/2023   Diabetes: Patient does not have a diagnosis of diabetes.     Smoking Status:   reports that she has never smoked. She has never used smokeless tobacco.    Allergies:  Allergies  Allergen Reactions   Demerol  Nausea And Vomiting   Dilaudid   [Hydromorphone  Hcl]    Dilaudid  [Hydromorphone  Hcl] Nausea And Vomiting and Other (See Comments)    Muscle cramping   Reglan  [Metoclopramide ] Other (See Comments)    Cramps in hands   Stadol [Butorphanol Tartrate] Nausea And Vomiting    Injection   Sulfamethoxazole  Nausea And Vomiting   Cephalexin Rash   Penicillins Rash    Rash only; note also rash to Keflex Has patient had a PCN reaction causing immediate rash, facial/tongue/throat swelling, SOB or lightheadedness with hypotension: rash Has patient had a PCN reaction causing severe rash involving mucus membranes or skin necrosis: no Has patient had a PCN reaction that  required hospitalization no Has patient had a PCN reaction occurring within the last 10 years: no If all of the above answers are "NO", then may proceed with Cephalosporin use.    Medications: No current facility-administered medications for this encounter.   Current Outpatient Medications  Medication Sig Dispense Refill   acetaminophen  (TYLENOL ) 650 MG CR tablet Take 650-1,300 mg by mouth every 8 (eight) hours as needed for pain.     allopurinol  (ZYLOPRIM ) 300 MG tablet Take 1 tablet (300 mg total) by mouth daily. 90 tablet 3   Bismuth  Subgallate 200 MG CHEW Chew 600 mg by mouth in the morning, at noon, in the evening, and at bedtime.     cholecalciferol (VITAMIN D3) 25 MCG (1000 UNIT)  tablet Take 1,000 Units by mouth in the morning.     furosemide  (LASIX ) 20 MG tablet Take 1 tablet (20 mg total) by mouth daily. 90 tablet 3   losartan (COZAAR) 50 MG tablet TAKE 1 TABLET BY MOUTH DAILY (Patient taking differently: Take 25 mg by mouth every evening.) 90 tablet 3   Melatonin 10 MG TABS Take 10 mg by mouth at bedtime.     metoprolol  succinate (TOPROL -XL) 50 MG 24 hr tablet Take 1 tablet (50 mg total) by mouth daily. 90 tablet 3   potassium chloride  SA (K-DUR,KLOR-CON ) 20 MEQ tablet Take 1 tablet (20 mEq total) by mouth daily. 30 tablet 0   Probiotic Product (PROBIOTIC DAILY PO) Take 1 capsule by mouth in the morning.     trimethoprim  (TRIMPEX ) 100 MG tablet Take 100 mg by mouth at bedtime.      No results found for this or any previous visit (from the past 48 hours). No results found.  ROS: Pain with rom of the right upper extremity  Physical Exam: Alert and oriented 73 y.o. female in no acute distress Cranial nerves 2-12 intact Cervical spine: full rom with no tenderness, nv intact distally Chest: active breath sounds bilaterally, no wheeze rhonchi or rales Heart: regular rate and rhythm, no murmur Abd: non tender non distended with active bowel sounds Hip is stable with rom  Right  shoulder painful and weak rom Nv intact distally No rashes or edema distally  Assessment/Plan Assessment: right shoulder cuff arthropathy  Plan:  Patient will undergo a right reverse total shoulder by Dr. Brunilda Capra at [facility] Risks benefits and expectations were discussed with the patient. Patient understand risks, benefits and expectations and wishes to proceed. Preoperative templating of the joint replacement has been completed, documented, and submitted to the Operating Room personnel in order to optimize intra-operative equipment management.   Lorina Roosevelt PA-C, MPAS [facility] is now [employer] [address], [address], [place], [place] [address] Phone: [phone] [email or web address] Facebook  [employer]

## 2023-10-02 ENCOUNTER — Other Ambulatory Visit: Payer: Self-pay | Admitting: Family Medicine

## 2023-10-02 DIAGNOSIS — E876 Hypokalemia: Secondary | ICD-10-CM

## 2023-10-02 MED ORDER — POTASSIUM CHLORIDE CRYS ER 20 MEQ PO TBCR: 20.0000 meq | EXTENDED_RELEASE_TABLET | Freq: Every day | ORAL | 2 refills | Status: DC

## 2023-10-02 NOTE — Telephone Encounter (Signed)
 Last Fill: 06/22/24 Historical Provider  Last OV: 07/23/23 Next OV: 10/22/23  Routing to provider for review/authorization.

## 2023-10-02 NOTE — Telephone Encounter (Signed)
 Copied from CRM [id number]. Topic: Clinical - Medication Refill >> Oct 02, 2023 10:27 AM Juleen Oakland F wrote: Medication: potassium chloride  SA (K-DUR,KLOR-CON ) 20 MEQ tablet   Has the patient contacted their pharmacy? Yes (Agent: If no, request that the patient contact the pharmacy for the refill. If patient does not wish to contact the pharmacy document the reason why and proceed with request.) (Agent: If yes, when and what did the pharmacy advise?)  This is the patient's preferred pharmacy:   [id number] - [place], [place] - [address] AT [address] [place] [address] Phone: [phone] Fax: [phone]  Is this the correct pharmacy for this prescription? Yes If no, delete pharmacy and type the correct one.   Has the prescription been filled recently? Yes  Is the patient out of the medication? No  Has the patient been seen for an appointment in the last year OR does the patient have an upcoming appointment? Yes  Can we respond through MyChart? Yes  Agent: Please be advised that Rx refills may take up to 3 business days. We ask that you follow-up with your pharmacy.

## 2023-10-04 NOTE — Progress Notes (Addendum)
 COVID Vaccine received:  []  No [x]  Yes Date of any COVID positive Test in last 90 days: NO PCP - Remo Carls MD Cardiologist -   Chest x-ray -  EKG -  07/23/23 Stress Test -  ECHO -  Cardiac Cath - 02/13/12 Epic  Bowel Prep - [x]  No  []   Yes ______  Pacemaker / ICD device [x]  No []  Yes   Spinal Cord Stimulator:[x]  No []  Yes       History of Sleep Apnea? []  No [x]  Yes   CPAP used?- [x]  No []  Yes    Does the patient monitor blood sugar?          [x]  No []  Yes  []  N/A  Patient has: [x]  NO Hx DM   []  Pre-DM                 []  DM1  []   DM2 Does patient have a [employer] or Dexacom? []  No []  Yes   Fasting Blood Sugar Ranges-  Checks Blood Sugar _____ times a day  GLP1 agonist / usual dose - NO GLP1 instructions:  SGLT-2 inhibitors / usual dose - NO SGLT-2 instructions:   Blood Thinner / Instructions:NO Aspirin  Instructions:NO  Comments:   Activity level: Patient is able  to climb a flight of stairs without difficulty; [x]  No CP  [x]  No SOB,_   Patient can  perform ADLs without assistance.   Anesthesia review: Has 1 kidney, CKD, HTN  Patient denies shortness of breath, fever, cough and chest pain at PAT appointment.  Patient verbalized understanding and agreement to the Pre-Surgical Instructions that were given to them at this PAT appointment. Patient was also educated of the need to review these PAT instructions again prior to his/her surgery.I reviewed the appropriate phone numbers to call if they have any and questions or concerns.

## 2023-10-04 NOTE — Patient Instructions (Addendum)
 SURGICAL WAITING ROOM VISITATION  Patients having surgery or a procedure may have no more than 2 support people in the waiting area - these visitors may rotate.    Children under the age of 25 must have an adult with them who is not the patient.  Visitors with respiratory illnesses are discouraged from visiting and should remain at home.  If the patient needs to stay at the hospital during part of their recovery, the visitor guidelines for inpatient rooms apply. Pre-op nurse will coordinate an appropriate time for 1 support person to accompany patient in pre-op.  This support person may not rotate.    Please refer to the [facility] website for the visitor guidelines for Inpatients (after your surgery is over and you are in a regular room).       Your procedure is scheduled on: 10/19/23   Report to [facility] Main Entrance    Report to admitting at 6:45 AM   Call this number if you have problems the morning of surgery [phone]   Do not eat food :After Midnight.   After Midnight you may have the following liquids until 6:15 AM DAY OF SURGERY  Water  Non-Citrus Juices (without pulp, NO RED-Apple, White grape, White cranberry) Black Coffee (NO MILK/CREAM OR CREAMERS, sugar ok)  Clear Tea (NO MILK/CREAM OR CREAMERS, sugar ok) regular and decaf                             Plain Jell-O (NO RED)                                           Fruit ices (not with fruit pulp, NO RED)                                     Popsicles (NO RED)                                                               Sports drinks like Gatorade (NO RED)                  The day of surgery:  Drink ONE (1) Pre-Surgery Clear Ensure at 6:15 AM the morning of surgery. Drink in one sitting. Do not sip.  This drink was given to you during your hospital  pre-op appointment visit. Nothing else to drink after completing the  Pre-Surgery Clear Ensure.          If you have questions, please contact your  surgeon's office.   Oral Hygiene is also important to reduce your risk of infection.                                    Remember - BRUSH YOUR TEETH THE MORNING OF SURGERY WITH YOUR REGULAR TOOTHPASTE   Stop all vitamins and herbal supplements 7 days before surgery.   Take these medicines the morning of surgery with A SIP OF WATER : Allopurinol , metoprolol , tylenol  if  needed.              Do not take Lasix  or Losartan the morning of surgery.             You may not have any metal on your body including hair pins, jewelry, and body piercing             Do not wear make-up, lotions, powders, perfumes/cologne, or deodorant  Do not wear nail polish including gel and S&S, artificial/acrylic nails, or any other type of covering on natural nails including finger and toenails. If you have artificial nails, gel coating, etc. that needs to be removed by a nail salon please have this removed prior to surgery or surgery may need to be canceled/ delayed if the surgeon/ anesthesia feels like they are unable to be safely monitored.   Do not shave  48 hours prior to surgery.    Do not bring valuables to the hospital. [facility] IS NOT             RESPONSIBLE   FOR VALUABLES.   Contacts, glasses, dentures or bridgework may not be worn into surgery.   Bring small overnight bag day of surgery.   DO NOT BRING YOUR HOME MEDICATIONS TO THE HOSPITAL. PHARMACY WILL DISPENSE MEDICATIONS LISTED ON YOUR MEDICATION LIST TO YOU DURING YOUR ADMISSION IN THE HOSPITAL!    Patients discharged on the day of surgery will not be allowed to drive home.  Someone NEEDS to stay with you for the first 24 hours after anesthesia.   Special Instructions: Bring a copy of your healthcare power of attorney and living will documents the day of surgery if you haven't scanned them before.              Please read over the following fact sheets you were given: IF YOU HAVE QUESTIONS ABOUT YOUR PRE-OP INSTRUCTIONS PLEASE CALL  [phone] Ammon Bales   If you received a COVID test during your pre-op visit  it is requested that you wear a mask when out in public, stay away from anyone that may not be feeling well and notify your surgeon if you develop symptoms. If you test positive for Covid or have been in contact with anyone that has tested positive in the last 10 days please notify you surgeon.      Pre-operative 5 CHG Bath Instructions   You can play a key role in reducing the risk of infection after surgery. Your skin needs to be as free of germs as possible. You can reduce the number of germs on your skin by washing with CHG (chlorhexidine  gluconate) soap before surgery. CHG is an antiseptic soap that kills germs and continues to kill germs even after washing.   DO NOT use if you have an allergy to chlorhexidine /CHG or antibacterial soaps. If your skin becomes reddened or irritated, stop using the CHG and notify one of our RNs at [phone].   Please shower with the CHG soap starting 4 days before surgery using the following schedule:     Please keep in mind the following:  DO NOT shave, including legs and underarms, starting the day of your first shower.   You may shave your face at any point before/day of surgery.  Place clean sheets on your bed the day you start using CHG soap. Use a clean washcloth (not used since being washed) for each shower. DO NOT sleep with pets once you start using the CHG.   CHG Shower  Instructions:  If you choose to wash your hair and private area, wash first with your normal shampoo/soap.  After you use shampoo/soap, rinse your hair and body thoroughly to remove shampoo/soap residue.  Turn the water  OFF and apply about 3 tablespoons (45 ml) of CHG soap to a CLEAN washcloth.  Apply CHG soap ONLY FROM YOUR NECK DOWN TO YOUR TOES (washing for 3-5 minutes)  DO NOT use CHG soap on face, private areas, open wounds, or sores.  Pay special attention to the area where your surgery is  being performed.  If you are having back surgery, having someone wash your back for you may be helpful. Wait 2 minutes after CHG soap is applied, then you may rinse off the CHG soap.  Pat dry with a clean towel  Put on clean clothes/pajamas   If you choose to wear lotion, please use ONLY the CHG-compatible lotions on the back of this paper.     Additional instructions for the day of surgery: DO NOT APPLY any lotions, deodorants, cologne, or perfumes.   Put on clean/comfortable clothes.  Brush your teeth.  Ask your nurse before applying any prescription medications to the skin.      CHG Compatible Lotions   Aveeno Moisturizing lotion  Cetaphil Moisturizing Cream  Cetaphil Moisturizing Lotion  Clairol Herbal Essence Moisturizing Lotion, Dry Skin  Clairol Herbal Essence Moisturizing Lotion, Extra Dry Skin  Clairol Herbal Essence Moisturizing Lotion, Normal Skin  Curel Age Defying Therapeutic Moisturizing Lotion with Alpha Hydroxy  Curel Extreme Care Body Lotion  Curel Soothing Hands Moisturizing Hand Lotion  Curel Therapeutic Moisturizing Cream, Fragrance-Free  Curel Therapeutic Moisturizing Lotion, Fragrance-Free  Curel Therapeutic Moisturizing Lotion, Original Formula  Eucerin Daily Replenishing Lotion  Eucerin Dry Skin Therapy Plus Alpha Hydroxy Crme  Eucerin Dry Skin Therapy Plus Alpha Hydroxy Lotion  Eucerin Original Crme  Eucerin Original Lotion  Eucerin Plus Crme Eucerin Plus Lotion  Eucerin TriLipid Replenishing Lotion  Keri Anti-Bacterial Hand Lotion  Keri Deep Conditioning Original Lotion Dry Skin Formula Softly Scented  Keri Deep Conditioning Original Lotion, Fragrance Free Sensitive Skin Formula  Keri Lotion Fast Absorbing Fragrance Free Sensitive Skin Formula  Keri Lotion Fast Absorbing Softly Scented Dry Skin Formula  Keri Original Lotion  Keri Skin Renewal Lotion Keri Silky Smooth Lotion  Keri Silky Smooth Sensitive Skin Lotion  Nivea Body Creamy  Conditioning Oil  Nivea Body Extra Enriched Lotion  Nivea Body Original Lotion  Nivea Body Sheer Moisturizing Lotion Nivea Crme  Nivea Skin Firming Lotion  NutraDerm 30 Skin Lotion  NutraDerm Skin Lotion  NutraDerm Therapeutic Skin Cream  NutraDerm Therapeutic Skin Lotion  ProShield Protective Hand Cream   Incentive Spirometer  An incentive spirometer is a tool that can help keep your lungs clear and active. This tool measures how well you are filling your lungs with each breath. Taking long deep breaths may help reverse or decrease the chance of developing breathing (pulmonary) problems (especially infection) following: A long period of time when you are unable to move or be active. BEFORE THE PROCEDURE  If the spirometer includes an indicator to show your best effort, your nurse or respiratory therapist will set it to a desired goal. If possible, sit up straight or lean slightly forward. Try not to slouch. Hold the incentive spirometer in an upright position. INSTRUCTIONS FOR USE  Sit on the edge of your bed if possible, or sit up as far as you can in bed or on a chair. Hold the incentive  spirometer in an upright position. Breathe out normally. Place the mouthpiece in your mouth and seal your lips tightly around it. Breathe in slowly and as deeply as possible, raising the piston or the ball toward the top of the column. Hold your breath for 3-5 seconds or for as long as possible. Allow the piston or ball to fall to the bottom of the column. Remove the mouthpiece from your mouth and breathe out normally. Rest for a few seconds and repeat Steps 1 through 7 at least 10 times every 1-2 hours when you are awake. Take your time and take a few normal breaths between deep breaths. The spirometer may include an indicator to show your best effort. Use the indicator as a goal to work toward during each repetition. After each set of 10 deep breaths, practice coughing to be sure your lungs are  clear. If you have an incision (the cut made at the time of surgery), support your incision when coughing by placing a pillow or rolled up towels firmly against it. Once you are able to get out of bed, walk around indoors and cough well. You may stop using the incentive spirometer when instructed by your caregiver.  RISKS AND COMPLICATIONS Take your time so you do not get dizzy or light-headed. If you are in pain, you may need to take or ask for pain medication before doing incentive spirometry. It is harder to take a deep breath if you are having pain. AFTER USE Rest and breathe slowly and easily. It can be helpful to keep track of a log of your progress. Your caregiver can provide you with a simple table to help with this. If you are using the spirometer at home, follow these instructions: SEEK MEDICAL CARE IF:  You are having difficultly using the spirometer. You have trouble using the spirometer as often as instructed. Your pain medication is not giving enough relief while using the spirometer. You develop fever of 100.5 F (38.1 C) or higher. SEEK IMMEDIATE MEDICAL CARE IF:  You cough up bloody sputum that had not been present before. You develop fever of 102 F (38.9 C) or greater. You develop worsening pain at or near the incision site. MAKE SURE YOU:  Understand these instructions. Will watch your condition. Will get help right away if you are not doing well or get worse. Document Released: 08/21/2006 Document Revised: 07/03/2011 Document Reviewed: 10/22/2006 [facility] Patient Information 2014 [place], [place]- Preparing for Total Shoulder Arthroplasty    Before surgery, you can play an important role. Because skin is not sterile, your skin needs to be as free of germs as possible. You can reduce the number of germs on your skin by using the following products. Benzoyl Peroxide Gel Reduces the number of germs present on the skin Applied twice a day to shoulder area  starting two days before surgery    ==================================================================  Please follow these instructions carefully:  BENZOYL PEROXIDE 5% GEL  Please do not use if you have an allergy to benzoyl peroxide.   If your skin becomes reddened/irritated stop using the benzoyl peroxide.  Starting two days before surgery, apply as follows: Apply benzoyl peroxide in the morning and at night. Apply after taking a shower. If you are not taking a shower clean entire shoulder front, back, and side along with the armpit with a clean wet washcloth.  Place a quarter-sized dollop on your shoulder and rub in thoroughly, making sure to cover the front, back, and side of  your shoulder, along with the armpit.   2 days before ____ AM   ____ PM              1 day before ____ AM   ____ PM                         Do this twice a day for two days.  (Last application is the night before surgery, AFTER using the CHG soap as described below).  Do NOT apply benzoyl peroxide gel on the day of surgery.

## 2023-10-08 ENCOUNTER — Encounter (HOSPITAL_COMMUNITY): Payer: Self-pay

## 2023-10-08 ENCOUNTER — Encounter (HOSPITAL_COMMUNITY)
Admission: RE | Admit: 2023-10-08 | Discharge: 2023-10-08 | Disposition: A | Discharge status: Home / Self Care | Source: Ambulatory Visit | Attending: Orthopedic Surgery | Admitting: Orthopedic Surgery

## 2023-10-08 ENCOUNTER — Other Ambulatory Visit: Payer: Self-pay

## 2023-10-08 VITALS — BP 135/80 | HR 93 | Resp 16 | Ht 63.0 in | Wt 180.0 lb

## 2023-10-08 DIAGNOSIS — Z01812 Encounter for preprocedural laboratory examination: Secondary | ICD-10-CM | POA: Insufficient documentation

## 2023-10-08 DIAGNOSIS — Z01818 Encounter for other preprocedural examination: Secondary | ICD-10-CM

## 2023-10-08 DIAGNOSIS — I10 Essential (primary) hypertension: Secondary | ICD-10-CM | POA: Diagnosis not present

## 2023-10-08 LAB — BASIC METABOLIC PANEL WITH GFR
Anion gap: 10 (ref 5–15)
BUN: 21 mg/dL (ref 8–23)
CO2: 21 mmol/L — ABNORMAL LOW (ref 22–32)
Calcium: 9.8 mg/dL (ref 8.9–10.3)
Chloride: 108 mmol/L (ref 98–111)
Creatinine, Ser: 1.27 mg/dL — ABNORMAL HIGH (ref 0.44–1.00)
GFR, Estimated: 44 mL/min — ABNORMAL LOW (ref >60)
Glucose, Bld: 96 mg/dL (ref 70–99)
Potassium: 4.1 mmol/L (ref 3.5–5.1)
Sodium: 139 mmol/L (ref 135–145)

## 2023-10-08 LAB — CBC
HCT: 44.5 % (ref 36.0–46.0)
Hemoglobin: 14.3 g/dL (ref 12.0–15.0)
MCH: 31.3 pg (ref 26.0–34.0)
MCHC: 32.1 g/dL (ref 30.0–36.0)
MCV: 97.4 fL (ref 80.0–100.0)
Platelets: 252 10*3/uL (ref 150–400)
RBC: 4.57 MIL/uL (ref 3.87–5.11)
RDW: 13.6 % (ref 11.5–15.5)
WBC: 4.8 10*3/uL (ref 4.0–10.5)
nRBC: 0 % (ref 0.0–0.2)

## 2023-10-08 LAB — SURGICAL PCR SCREEN
MRSA, PCR: NEGATIVE
Staphylococcus aureus: NEGATIVE

## 2023-10-15 ENCOUNTER — Other Ambulatory Visit: Payer: Self-pay | Admitting: Family Medicine

## 2023-10-15 NOTE — Telephone Encounter (Unsigned)
 Copied from CRM [id number]. Topic: Clinical - Medication Refill >> Oct 15, 2023  3:58 PM Thersia C wrote: Medication: furosemide  (LASIX ) 20 MG tablet  Has the patient contacted their pharmacy? Yes (Agent: If no, request that the patient contact the pharmacy for the refill. If patient does not wish to contact the pharmacy document the reason why and proceed with request.) (Agent: If yes, when and what did the pharmacy advise?)  This is the patient's preferred pharmacy:  [id number] - [place], [place] - [address] AT [facility] [address] [address] [place] [address] Phone: [phone] Fax: [phone]  Is this the correct pharmacy for this prescription? Yes If no, delete pharmacy and type the correct one.   Has the prescription been filled recently? No  Is the patient out of the medication? Yes  Has the patient been seen for an appointment in the last year OR does the patient have an upcoming appointment? Yes  Can we respond through MyChart? Yes  Agent: Please be advised that Rx refills may take up to 3 business days. We ask that you follow-up with your pharmacy.

## 2023-10-16 MED ORDER — FUROSEMIDE 20 MG PO TABS: 20.0000 mg | ORAL_TABLET | Freq: Every day | ORAL | 0 refills | Status: DC

## 2023-10-18 NOTE — Progress Notes (Signed)
 Called and updated in change in surgery time.   Patient aware to be at [facility] at 0800 for surgery time of 1050.

## 2023-10-19 ENCOUNTER — Observation Stay (HOSPITAL_COMMUNITY)
Admission: RE | Admit: 2023-10-19 | Discharge: 2023-10-20 | Disposition: A | Discharge status: Home / Self Care | Attending: Orthopedic Surgery | Admitting: Orthopedic Surgery

## 2023-10-19 ENCOUNTER — Other Ambulatory Visit: Payer: Self-pay

## 2023-10-19 ENCOUNTER — Encounter (HOSPITAL_COMMUNITY): Payer: Self-pay | Admitting: Orthopedic Surgery

## 2023-10-19 ENCOUNTER — Ambulatory Visit (HOSPITAL_COMMUNITY)

## 2023-10-19 ENCOUNTER — Ambulatory Visit (HOSPITAL_BASED_OUTPATIENT_CLINIC_OR_DEPARTMENT_OTHER): Admitting: Certified Registered"

## 2023-10-19 ENCOUNTER — Encounter (HOSPITAL_COMMUNITY)
Admission: RE | Disposition: A | Discharge status: Home / Self Care | Payer: Self-pay | Source: Home / Self Care | Attending: Orthopedic Surgery

## 2023-10-19 ENCOUNTER — Ambulatory Visit (HOSPITAL_COMMUNITY): Payer: Self-pay | Admitting: Physician Assistant

## 2023-10-19 DIAGNOSIS — I10 Essential (primary) hypertension: Secondary | ICD-10-CM | POA: Diagnosis not present

## 2023-10-19 DIAGNOSIS — Z96611 Presence of right artificial shoulder joint: Principal | ICD-10-CM

## 2023-10-19 DIAGNOSIS — M75101 Unspecified rotator cuff tear or rupture of right shoulder, not specified as traumatic: Principal | ICD-10-CM | POA: Insufficient documentation

## 2023-10-19 DIAGNOSIS — Z96653 Presence of artificial knee joint, bilateral: Secondary | ICD-10-CM | POA: Diagnosis not present

## 2023-10-19 DIAGNOSIS — Z86711 Personal history of pulmonary embolism: Secondary | ICD-10-CM | POA: Diagnosis not present

## 2023-10-19 DIAGNOSIS — Z79899 Other long term (current) drug therapy: Secondary | ICD-10-CM | POA: Insufficient documentation

## 2023-10-19 DIAGNOSIS — Z96619 Presence of unspecified artificial shoulder joint: Secondary | ICD-10-CM | POA: Diagnosis not present

## 2023-10-19 DIAGNOSIS — N1832 Chronic kidney disease, stage 3b: Secondary | ICD-10-CM

## 2023-10-19 DIAGNOSIS — Z853 Personal history of malignant neoplasm of breast: Secondary | ICD-10-CM | POA: Diagnosis not present

## 2023-10-19 DIAGNOSIS — E7800 Pure hypercholesterolemia, unspecified: Secondary | ICD-10-CM | POA: Diagnosis not present

## 2023-10-19 DIAGNOSIS — M12811 Other specific arthropathies, not elsewhere classified, right shoulder: Secondary | ICD-10-CM | POA: Diagnosis not present

## 2023-10-19 DIAGNOSIS — I129 Hypertensive chronic kidney disease with stage 1 through stage 4 chronic kidney disease, or unspecified chronic kidney disease: Secondary | ICD-10-CM

## 2023-10-19 HISTORY — PX: REVERSE SHOULDER ARTHROPLASTY: SHX5054

## 2023-10-19 SURGERY — ARTHROPLASTY, SHOULDER, TOTAL, REVERSE
Anesthesia: General | Site: Shoulder | Laterality: Right

## 2023-10-19 MED ORDER — SODIUM CHLORIDE 0.9% FLUSH
3.0000 mL | Freq: Two times a day (BID) | INTRAVENOUS | Status: DC
  Administered 2023-10-19: 10 mL via INTRAVENOUS
  Administered 2023-10-19: 3 mL via INTRAVENOUS
  Administered 2023-10-20: 5 mL via INTRAVENOUS

## 2023-10-19 MED ORDER — ROCURONIUM BROMIDE 10 MG/ML (PF) SYRINGE
PREFILLED_SYRINGE | INTRAVENOUS | Status: AC
  Filled 2023-10-19: qty 10

## 2023-10-19 MED ORDER — ORAL CARE MOUTH RINSE: 15.0000 mL | Freq: Once | OROMUCOSAL | Status: AC

## 2023-10-19 MED ORDER — ONDANSETRON HCL 4 MG PO TABS: 4.0000 mg | ORAL_TABLET | Freq: Four times a day (QID) | ORAL | Status: DC | PRN

## 2023-10-19 MED ORDER — TRANEXAMIC ACID-NACL 1000-0.7 MG/100ML-% IV SOLN
1000.0000 mg | INTRAVENOUS | Status: AC
  Administered 2023-10-19: 1000 mg via INTRAVENOUS
  Filled 2023-10-19: qty 100

## 2023-10-19 MED ORDER — BUPIVACAINE HCL (PF) 0.5 % IJ SOLN
INTRAMUSCULAR | Status: DC | PRN
  Administered 2023-10-19: 15 mL via PERINEURAL

## 2023-10-19 MED ORDER — METOCLOPRAMIDE HCL 5 MG/ML IJ SOLN: 5.0000 mg | Freq: Three times a day (TID) | INTRAMUSCULAR | Status: DC | PRN

## 2023-10-19 MED ORDER — LOSARTAN POTASSIUM 50 MG PO TABS
50.0000 mg | ORAL_TABLET | Freq: Every day | ORAL | Status: DC
  Administered 2023-10-19: 50 mg via ORAL
  Filled 2023-10-19: qty 1

## 2023-10-19 MED ORDER — ACETAMINOPHEN ER 650 MG PO TBCR: 650.0000 mg | EXTENDED_RELEASE_TABLET | Freq: Three times a day (TID) | ORAL | Status: DC | PRN

## 2023-10-19 MED ORDER — RISAQUAD PO CAPS
1.0000 | ORAL_CAPSULE | Freq: Every day | ORAL | Status: DC
  Filled 2023-10-19: qty 1

## 2023-10-19 MED ORDER — METOCLOPRAMIDE HCL 5 MG PO TABS: 5.0000 mg | ORAL_TABLET | Freq: Three times a day (TID) | ORAL | Status: DC | PRN

## 2023-10-19 MED ORDER — OXYCODONE HCL 5 MG/5ML PO SOLN: 5.0000 mg | Freq: Once | ORAL | Status: DC | PRN

## 2023-10-19 MED ORDER — POLYETHYLENE GLYCOL 3350 17 G PO PACK
17.0000 g | PACK | Freq: Every day | ORAL | Status: DC | PRN
Start: 2023-10-19 — End: 2023-10-20

## 2023-10-19 MED ORDER — LIDOCAINE HCL (PF) 2 % IJ SOLN
INTRAMUSCULAR | Status: AC
  Filled 2023-10-19: qty 5

## 2023-10-19 MED ORDER — METOPROLOL SUCCINATE ER 50 MG PO TB24
50.0000 mg | ORAL_TABLET | Freq: Every day | ORAL | Status: DC
  Administered 2023-10-20: 50 mg via ORAL
  Filled 2023-10-19: qty 1

## 2023-10-19 MED ORDER — PHENYLEPHRINE HCL-NACL 20-0.9 MG/250ML-% IV SOLN
INTRAVENOUS | Status: DC | PRN
  Administered 2023-10-19: 25 ug/min via INTRAVENOUS

## 2023-10-19 MED ORDER — MELATONIN 10 MG PO TABS: 10.0000 mg | ORAL_TABLET | Freq: Every day | ORAL | Status: DC

## 2023-10-19 MED ORDER — PROPOFOL 10 MG/ML IV BOLUS
INTRAVENOUS | Status: DC | PRN
  Administered 2023-10-19: 150 mg via INTRAVENOUS
  Administered 2023-10-19: 50 mg via INTRAVENOUS

## 2023-10-19 MED ORDER — ALLOPURINOL 300 MG PO TABS
300.0000 mg | ORAL_TABLET | Freq: Every day | ORAL | Status: DC
  Administered 2023-10-20: 300 mg via ORAL
  Filled 2023-10-19: qty 1

## 2023-10-19 MED ORDER — BUPIVACAINE-EPINEPHRINE (PF) 0.25% -1:200000 IJ SOLN
INTRAMUSCULAR | Status: AC
  Filled 2023-10-19: qty 30

## 2023-10-19 MED ORDER — 0.9 % SODIUM CHLORIDE (POUR BTL) OPTIME
TOPICAL | Status: DC | PRN
  Administered 2023-10-19: 1000 mL

## 2023-10-19 MED ORDER — DOCUSATE SODIUM 100 MG PO CAPS
100.0000 mg | ORAL_CAPSULE | Freq: Two times a day (BID) | ORAL | Status: DC
Start: 2023-10-19 — End: 2023-10-20
  Filled 2023-10-19 (×2): qty 1

## 2023-10-19 MED ORDER — FENTANYL CITRATE PF 50 MCG/ML IJ SOSY: 25.0000 ug | PREFILLED_SYRINGE | INTRAMUSCULAR | Status: DC | PRN

## 2023-10-19 MED ORDER — ROCURONIUM BROMIDE 10 MG/ML (PF) SYRINGE
PREFILLED_SYRINGE | INTRAVENOUS | Status: DC | PRN
  Administered 2023-10-19: 30 mg via INTRAVENOUS

## 2023-10-19 MED ORDER — VITAMIN D 25 MCG (1000 UNIT) PO TABS
1000.0000 [IU] | ORAL_TABLET | Freq: Every day | ORAL | Status: DC
  Administered 2023-10-20: 1000 [IU] via ORAL
  Filled 2023-10-19: qty 1

## 2023-10-19 MED ORDER — SODIUM CHLORIDE 0.9% FLUSH: 3.0000 mL | INTRAVENOUS | Status: DC | PRN

## 2023-10-19 MED ORDER — ONDANSETRON HCL 4 MG/2ML IJ SOLN: 4.0000 mg | Freq: Four times a day (QID) | INTRAMUSCULAR | Status: DC | PRN

## 2023-10-19 MED ORDER — ONDANSETRON HCL 4 MG/2ML IJ SOLN
INTRAMUSCULAR | Status: AC
  Filled 2023-10-19: qty 2

## 2023-10-19 MED ORDER — MORPHINE SULFATE (PF) 2 MG/ML IV SOLN: 0.5000 mg | INTRAVENOUS | Status: DC | PRN

## 2023-10-19 MED ORDER — BISMUTH SUBGALLATE 200 MG PO CHEW: 600.0000 mg | CHEWABLE_TABLET | Freq: Three times a day (TID) | ORAL | Status: DC

## 2023-10-19 MED ORDER — LIDOCAINE HCL (PF) 2 % IJ SOLN
INTRAMUSCULAR | Status: DC | PRN
  Administered 2023-10-19: 80 mg via INTRADERMAL

## 2023-10-19 MED ORDER — BISMUTH SUBSALICYLATE 262 MG PO CHEW
524.0000 mg | CHEWABLE_TABLET | Freq: Three times a day (TID) | ORAL | Status: DC
  Administered 2023-10-20: 524 mg via ORAL
  Filled 2023-10-19 (×3): qty 2

## 2023-10-19 MED ORDER — TRANEXAMIC ACID-NACL 1000-0.7 MG/100ML-% IV SOLN
1000.0000 mg | Freq: Once | INTRAVENOUS | Status: AC
  Administered 2023-10-19: 1000 mg via INTRAVENOUS
  Filled 2023-10-19: qty 100

## 2023-10-19 MED ORDER — VANCOMYCIN HCL IN DEXTROSE 1-5 GM/200ML-% IV SOLN
1000.0000 mg | INTRAVENOUS | Status: AC
  Administered 2023-10-19: 1000 mg via INTRAVENOUS
  Filled 2023-10-19: qty 200

## 2023-10-19 MED ORDER — MELATONIN 5 MG PO TABS
10.0000 mg | ORAL_TABLET | Freq: Every day | ORAL | Status: DC
  Administered 2023-10-20: 10 mg via ORAL
  Filled 2023-10-19: qty 2

## 2023-10-19 MED ORDER — PROBIOTIC DAILY PO CAPS: ORAL_CAPSULE | Freq: Every morning | ORAL | Status: DC

## 2023-10-19 MED ORDER — CHLORHEXIDINE GLUCONATE 0.12 % MT SOLN
15.0000 mL | Freq: Once | OROMUCOSAL | Status: AC
  Administered 2023-10-19: 15 mL via OROMUCOSAL

## 2023-10-19 MED ORDER — FENTANYL CITRATE PF 50 MCG/ML IJ SOSY
50.0000 ug | PREFILLED_SYRINGE | Freq: Once | INTRAMUSCULAR | Status: AC
  Administered 2023-10-19: 50 ug via INTRAVENOUS
  Filled 2023-10-19: qty 2

## 2023-10-19 MED ORDER — TRAMADOL HCL 50 MG PO TABS: 50.0000 mg | ORAL_TABLET | Freq: Four times a day (QID) | ORAL | 0 refills | Status: DC | PRN

## 2023-10-19 MED ORDER — POTASSIUM CHLORIDE CRYS ER 20 MEQ PO TBCR
20.0000 meq | EXTENDED_RELEASE_TABLET | Freq: Every day | ORAL | Status: DC
  Administered 2023-10-19 – 2023-10-20 (×2): 20 meq via ORAL
  Filled 2023-10-19 (×2): qty 1

## 2023-10-19 MED ORDER — ACETAMINOPHEN 325 MG PO TABS: 325.0000 mg | ORAL_TABLET | Freq: Four times a day (QID) | ORAL | Status: DC | PRN

## 2023-10-19 MED ORDER — DEXAMETHASONE SODIUM PHOSPHATE 10 MG/ML IJ SOLN
INTRAMUSCULAR | Status: AC
  Filled 2023-10-19: qty 1

## 2023-10-19 MED ORDER — LACTATED RINGERS IV SOLN: INTRAVENOUS | Status: DC

## 2023-10-19 MED ORDER — ACETAMINOPHEN 500 MG PO TABS: 500.0000 mg | ORAL_TABLET | Freq: Four times a day (QID) | ORAL | Status: DC | PRN

## 2023-10-19 MED ORDER — OXYCODONE HCL 5 MG PO TABS: 5.0000 mg | ORAL_TABLET | Freq: Once | ORAL | Status: DC | PRN

## 2023-10-19 MED ORDER — METHOCARBAMOL 1000 MG/10ML IJ SOLN: 500.0000 mg | Freq: Four times a day (QID) | INTRAMUSCULAR | Status: DC | PRN

## 2023-10-19 MED ORDER — PHENOL 1.4 % MT LIQD: 1.0000 | OROMUCOSAL | Status: DC | PRN

## 2023-10-19 MED ORDER — ONDANSETRON HCL 4 MG/2ML IJ SOLN: 4.0000 mg | Freq: Once | INTRAMUSCULAR | Status: DC | PRN

## 2023-10-19 MED ORDER — PROPOFOL 10 MG/ML IV BOLUS
INTRAVENOUS | Status: AC
  Filled 2023-10-19: qty 20

## 2023-10-19 MED ORDER — ONDANSETRON HCL 4 MG/2ML IJ SOLN
INTRAMUSCULAR | Status: DC | PRN
Start: 2023-10-19 — End: 2023-10-19
  Administered 2023-10-19: 4 mg via INTRAVENOUS

## 2023-10-19 MED ORDER — BUPIVACAINE LIPOSOME 1.3 % IJ SUSP
INTRAMUSCULAR | Status: DC | PRN
Start: 2023-10-19 — End: 2023-10-19
  Administered 2023-10-19: 10 mL via PERINEURAL

## 2023-10-19 MED ORDER — FUROSEMIDE 20 MG PO TABS
20.0000 mg | ORAL_TABLET | Freq: Every day | ORAL | Status: DC
Start: 2023-10-19 — End: 2023-10-20
  Administered 2023-10-19: 20 mg via ORAL
  Filled 2023-10-19 (×2): qty 1

## 2023-10-19 MED ORDER — VANCOMYCIN HCL IN DEXTROSE 1-5 GM/200ML-% IV SOLN
1000.0000 mg | Freq: Two times a day (BID) | INTRAVENOUS | Status: AC
  Administered 2023-10-19: 1000 mg via INTRAVENOUS
  Filled 2023-10-19: qty 200

## 2023-10-19 MED ORDER — ACETAMINOPHEN 500 MG PO TABS
1000.0000 mg | ORAL_TABLET | Freq: Once | ORAL | Status: AC
  Administered 2023-10-19: 1000 mg via ORAL
  Filled 2023-10-19: qty 2

## 2023-10-19 MED ORDER — SUGAMMADEX SODIUM 200 MG/2ML IV SOLN
INTRAVENOUS | Status: DC | PRN
  Administered 2023-10-19: 175 mg via INTRAVENOUS

## 2023-10-19 MED ORDER — TRAMADOL HCL 50 MG PO TABS: 50.0000 mg | ORAL_TABLET | Freq: Four times a day (QID) | ORAL | Status: DC | PRN

## 2023-10-19 MED ORDER — TRIMETHOPRIM 100 MG PO TABS
100.0000 mg | ORAL_TABLET | Freq: Every day | ORAL | Status: DC
  Administered 2023-10-20: 100 mg via ORAL
  Filled 2023-10-19: qty 1

## 2023-10-19 MED ORDER — MIDAZOLAM HCL 2 MG/2ML IJ SOLN: 1.0000 mg | Freq: Once | INTRAMUSCULAR | Status: DC

## 2023-10-19 MED ORDER — LEVOFLOXACIN IN D5W 500 MG/100ML IV SOLN
500.0000 mg | INTRAVENOUS | Status: DC
  Filled 2023-10-19: qty 100

## 2023-10-19 MED ORDER — METHOCARBAMOL 500 MG PO TABS: 500.0000 mg | ORAL_TABLET | Freq: Four times a day (QID) | ORAL | Status: DC | PRN

## 2023-10-19 MED ORDER — MENTHOL 3 MG MT LOZG: 1.0000 | LOZENGE | OROMUCOSAL | Status: DC | PRN

## 2023-10-19 MED ORDER — DEXAMETHASONE SODIUM PHOSPHATE 10 MG/ML IJ SOLN
INTRAMUSCULAR | Status: DC | PRN
  Administered 2023-10-19: 10 mg via INTRAVENOUS

## 2023-10-19 SURGICAL SUPPLY — 60 items
BAG COUNTER SPONGE SURGICOUNT (BAG) IMPLANT
BAG ZIPLOCK 12X15 (MISCELLANEOUS) IMPLANT
BIT DRILL 1.6MX128 (BIT) IMPLANT
BIT DRILL 170X2.5X (BIT) IMPLANT
BLADE SAG 18X100X1.27 (BLADE) ×2 IMPLANT
COVER BACK TABLE 60X90IN (DRAPES) ×2 IMPLANT
COVER SURGICAL LIGHT HANDLE (MISCELLANEOUS) ×2 IMPLANT
DRAPE INCISE IOBAN 66X45 STRL (DRAPES) ×2 IMPLANT
DRAPE SHEET LG 3/4 BI-LAMINATE (DRAPES) ×2 IMPLANT
DRAPE SURG ORHT 6 SPLT 77X108 (DRAPES) ×4 IMPLANT
DRAPE TOP 10253 STERILE (DRAPES) ×2 IMPLANT
DRAPE U-SHAPE 47X51 STRL (DRAPES) ×2 IMPLANT
DRSG ADAPTIC 3X8 NADH LF (GAUZE/BANDAGES/DRESSINGS) ×2 IMPLANT
DRSG AQUACEL AG ADV 3.5X10 (GAUZE/BANDAGES/DRESSINGS) ×2 IMPLANT
DURAPREP 26ML APPLICATOR (WOUND CARE) ×2 IMPLANT
ECCENTRIC EPIPHYSI MODULAR SZ1 (Trauma) IMPLANT
ELECT BLADE TIP CTD 4 INCH (ELECTRODE) ×2 IMPLANT
ELECT NDL TIP 2.8 STRL (NEEDLE) ×2 IMPLANT
ELECT NEEDLE TIP 2.8 STRL (NEEDLE) ×1 IMPLANT
ELECT PENCIL ROCKER SW 15FT (MISCELLANEOUS) ×2 IMPLANT
ELECT REM PT RETURN 15FT ADLT (MISCELLANEOUS) ×2 IMPLANT
FACESHIELD WRAPAROUND (MASK) ×1 IMPLANT
FACESHIELD WRAPAROUND OR TEAM (MASK) ×2 IMPLANT
GAUZE PAD ABD 8X10 STRL (GAUZE/BANDAGES/DRESSINGS) ×2 IMPLANT
GAUZE SPONGE 4X4 12PLY STRL (GAUZE/BANDAGES/DRESSINGS) ×2 IMPLANT
GLENOSPHERE DELTA XTEND LAT 38 (Miscellaneous) IMPLANT
GLOVE BIOGEL PI IND STRL 7.5 (GLOVE) ×2 IMPLANT
GLOVE BIOGEL PI IND STRL 8.5 (GLOVE) ×2 IMPLANT
GLOVE ORTHO TXT STRL SZ7.5 (GLOVE) ×2 IMPLANT
GLOVE SURG ORTHO 8.5 STRL (GLOVE) ×2 IMPLANT
GOWN STRL REUS W/ TWL XL LVL3 (GOWN DISPOSABLE) ×4 IMPLANT
KIT BASIN OR (CUSTOM PROCEDURE TRAY) ×2 IMPLANT
KIT TURNOVER KIT A (KITS) ×2 IMPLANT
MANIFOLD NEPTUNE II (INSTRUMENTS) ×2 IMPLANT
METAGLENE DELTA EXTEND (Trauma) IMPLANT
NDL MAYO CATGUT SZ4 TPR NDL (NEEDLE) IMPLANT
NEEDLE MAYO CATGUT SZ4 (NEEDLE) IMPLANT
NS IRRIG 1000ML POUR BTL (IV SOLUTION) ×2 IMPLANT
PACK SHOULDER (CUSTOM PROCEDURE TRAY) ×2 IMPLANT
PIN GUIDE 1.2 (PIN) IMPLANT
PIN GUIDE GLENOPHERE 1.5MX300M (PIN) IMPLANT
PIN METAGLENE 2.5 (PIN) IMPLANT
RESTRAINT HEAD UNIVERSAL NS (MISCELLANEOUS) ×2 IMPLANT
SCREW 4.5X36MM (Screw) IMPLANT
SCREW BN 18X4.5XSTRL SHLDR (Screw) IMPLANT
SLING ARM FOAM STRAP LRG (SOFTGOODS) IMPLANT
SPACER 38 PLUS 3 (Spacer) IMPLANT
SPIKE FLUID TRANSFER (MISCELLANEOUS) ×2 IMPLANT
STEM DELTA DIA 10 HA (Stem) IMPLANT
STRIP CLOSURE SKIN 1/2X4 (GAUZE/BANDAGES/DRESSINGS) ×2 IMPLANT
SUT MNCRL AB 4-0 PS2 18 (SUTURE) ×2 IMPLANT
SUT VIC AB 0 CT1 36 (SUTURE) ×2 IMPLANT
SUT VIC AB 0 CT2 27 (SUTURE) ×2 IMPLANT
SUT VIC AB 2-0 CT1 TAPERPNT 27 (SUTURE) ×2 IMPLANT
SUTURE FIBERWR #2 38 T-5 BLUE (SUTURE) ×4 IMPLANT
SUTURE FIBERWR#2 38 REV NDL BL (SUTURE) IMPLANT
TAPE CLOTH SURG 4X10 WHT LF (GAUZE/BANDAGES/DRESSINGS) IMPLANT
TOWEL GREEN STERILE FF (TOWEL DISPOSABLE) ×2 IMPLANT
TOWEL OR 17X26 10 PK STRL BLUE (TOWEL DISPOSABLE) ×2 IMPLANT
YANKAUER SUCT BULB TIP NO VENT (SUCTIONS) ×2 IMPLANT

## 2023-10-19 NOTE — Transfer of Care (Signed)
 Immediate Anesthesia Transfer of Care Note  Patient: Deanna Burke  Procedure(s) Performed: ARTHROPLASTY, SHOULDER, TOTAL, REVERSE (Right: Shoulder)  Patient Location: PACU  Anesthesia Type:General and Regional  Level of Consciousness: awake, alert , and oriented  Airway & Oxygen Therapy: Patient Spontanous Breathing and Patient connected to face mask oxygen  Post-op Assessment: Report given to RN and Post -op Vital signs reviewed and stable  Post vital signs: Reviewed and stable  Last Vitals:  Vitals Value Taken Time  BP 124/64 10/19/23 13:00  Temp    Pulse 83 10/19/23 13:02  Resp 23 10/19/23 13:02  SpO2 96 % 10/19/23 13:02  Vitals shown include unfiled device data.  Last Pain:  Vitals:   10/19/23 0950  TempSrc:   PainSc: 0-No pain         Complications: No notable events documented.

## 2023-10-19 NOTE — Op Note (Signed)
 NAME: Deanna Burke, Deanna F. MEDICAL RECORD NO: [id number] ACCOUNT NO: [id number] DATE OF BIRTH: 07-27-1950 FACILITY: [facility] LOCATION: [id number] PHYSICIAN: Elspeth SAUNDERS. Kay, MD  Operative Report   DATE OF PROCEDURE: 10/19/2023  PREOPERATIVE DIAGNOSIS:  Right shoulder rotator cuff tear arthropathy.  POSTOPERATIVE DIAGNOSIS: Right shoulder rotator cuff tear arthropathy.  PROCEDURE PERFORMED:  Right reverse total shoulder arthroplasty using DePuy Delta Xtend prosthesis with no subscapularis repair.  ATTENDING SURGEON:  Elspeth SAUNDERS. Kay, MD  ASSISTANT:  Debby Crock [place], [place], who was scrubbed during the entire procedure, and necessary for satisfactory completion of surgery.  ANESTHESIA:  General anesthesia was used plus interscalene block.  ESTIMATED BLOOD LOSS:  Minimal.  FLUID REPLACEMENT:  1200 mL crystalloid.  INSTRUMENT COUNTS:  Correct.  COMPLICATIONS:  None.  ANTIBIOTICS: Perioperative antibiotics were given.  INDICATIONS:  The patient is a 73 year old female who presents with worsening right shoulder pain and dysfunction due to end-stage osteoarthritis and rotator cuff tear arthropathy.  The patient has failed an extended period of conservative management and  presents desiring operative treatment to eliminate pain and to restore function.  Informed consent obtained.  DESCRIPTION OF PROCEDURE:  After an adequate level of anesthesia was achieved, the patient was positioned in a modified beach chair position.  The right shoulder was correctly identified and sterile prep and drape were performed.  Timeout called  verifying correct patient and correct site.  We entered the patient's shoulder using a standard deltopectoral approach starting at the coracoid process and extending down the anterior humerus.  Dissection down through the subcutaneous tissues using  Bovie.  Cephalic vein was identified and taken laterally with the deltoid.  Pectoralis taken medially.  The conjoined tendon was  identified and retracted medially.  Deep retractors were placed.  We went ahead and tenodesed the biceps in situ with 0  Vicryl figure-of-eight suture x2.  Next, we released the subscapularis remnant, which was scarred and not repairable, off the lesser tuberosity.  We tagged it for protection of the axillary nerve.  We then released the inferior capsule, progressively  externally rotating and extending the humerus out of the wound.  We removed some of the supraspinatus, which was still attached with some suture material and back to the infraspinatus.  We tried to preserve posterior infraspinatus and teres minor.  Next,  we placed our intramedullary guide.  After we extended the shoulder and delivered the humeral head out of the wound, there was advanced arthritis noted.  We placed our intramedullary guide and started reaming.  Next, we went ahead and placed her entry  reamer, 6-mm reamer, into the proximal humerus down the shaft.  We reamed up to a size 10.  We then placed our 10-mm T-handle guide and resected the head at 20 degrees of retroversion with the oscillating saw.  We removed excess osteophytes with a  rongeur.  We subluxed the humerus posteriorly, gaining good exposure of the glenoid face.  We removed the biceps stump, the labrum, and did a capsular release.  Next, we removed the remaining cartilage.  We had our deep retractors placed.  We placed our  guide pin centered low in the glenoid and reamed to subchondral bone for the metaglene baseplate.  We did our peripheral T-handle reamer.  We drilled out our central peg hole.  We then impacted the HA-coated press-fit baseplate into position.  We placed  a 42-mm screw inferiorly, a 36-mm screw superiorly, and an 18-mm screw anteriorly.  We locked the superior and inferior screws.  The baseplate was secured.  We selected a real 38 +0 standard poly and attached that to the baseplate with the screwdriver.   I did a finger sweep to make sure we had no  soft tissue caught up between the baseplate and the glenosphere.  We went to the humeral side and reamed for the one right metaphysis, so we trialed with the 10 stem in the one right metaphysis set in the 0  setting and impacted in 20 degrees of retroversion.  We used a 38+0 poly trial.  We reduced the shoulder.  We had excellent soft tissue balance and stability.  We removed the trial components.  We irrigated thoroughly.  We then used available bone graft  from the humeral head with impaction grafting technique, we impacted the HA-coated stem, size 10, and then one right metaphysis set in the 0 setting and impacted in 20 degrees of retroversion.  With a stable stem, we selected the real 38+3 poly, impacted  on the humeral tray, and reduced the shoulder with a nice little pop.  We then ranged the shoulder and had no impingement and excellent stability throughout a full arc of motion.  We irrigated thoroughly.  We then went ahead and resected the  subscapularis remnant.  Next, we closed the deltopectoral interval with 0 Vicryl suture followed by 2-0 Vicryl for subcutaneous closure and 4-0 running Monocryl skin.  Steri-Strips were applied followed by a sterile dressing.  The patient tolerated the  surgery well.   PUS D: 10/19/2023 3:10:34 pm T: 10/19/2023 8:05:00 pm  JOB: [id number]/ [id number]

## 2023-10-19 NOTE — Brief Op Note (Signed)
 10/19/2023  3:05 PM  PATIENT:  Deanna Burke  73 y.o. female  PRE-OPERATIVE DIAGNOSIS:  RIGHT shoulder rotator cuff arthropathy  POST-OPERATIVE DIAGNOSIS:  RIGHT shoulder rotator cuff arthropathy  PROCEDURE:  Procedure(s): ARTHROPLASTY, SHOULDER, TOTAL, REVERSE (Right) DePuy Delta Xtend with no subscap repair  SURGEON:  Surgeons and Role:    DEWAINE Kay Kemps, MD - Primary  PHYSICIAN ASSISTANT:   ASSISTANTS: Debby KATHEE Fireman, PA-C   ANESTHESIA:   regional and general  EBL:  50 mL   BLOOD ADMINISTERED:none  DRAINS: none   LOCAL MEDICATIONS USED:  MARCAINE      SPECIMEN:  No Specimen  DISPOSITION OF SPECIMEN:  N/A  COUNTS:  YES  TOURNIQUET:  * No tourniquets in log *  DICTATION: .Other Dictation: Dictation Number [id number]  PLAN OF CARE: Admit for overnight observation  PATIENT DISPOSITION:  PACU - hemodynamically stable.   Delay start of Pharmacological VTE agent (>24hrs) due to surgical blood loss or risk of bleeding: not applicable

## 2023-10-19 NOTE — Plan of Care (Signed)
  Problem: Activity: Goal: Risk for activity intolerance will decrease Outcome: Progressing   Problem: Nutrition: Goal: Adequate nutrition will be maintained Outcome: Progressing   Problem: Coping: Goal: Level of anxiety will decrease Outcome: Progressing   Problem: Elimination: Goal: Will not experience complications related to bowel motility Outcome: Progressing Goal: Will not experience complications related to urinary retention Outcome: Progressing   Problem: Pain Managment: Goal: General experience of comfort will improve and/or be controlled Outcome: Progressing   Problem: Safety: Goal: Ability to remain free from injury will improve Outcome: Progressing   Problem: Skin Integrity: Goal: Risk for impaired skin integrity will decrease Outcome: Progressing   Problem: Activity: Goal: Ability to tolerate increased activity will improve Outcome: Progressing   Problem: Pain Management: Goal: Pain level will decrease with appropriate interventions Outcome: Progressing

## 2023-10-19 NOTE — Anesthesia Procedure Notes (Signed)
 Anesthesia Regional Block: Interscalene brachial plexus block   Pre-Anesthetic Checklist: , timeout performed,  Correct Patient, Correct Site, Correct Laterality,  Correct Procedure, Correct Position, site marked,  Risks and benefits discussed,  Surgical consent,  Pre-op evaluation,  At surgeon's request and post-op pain management  Laterality: Right  Prep: chloraprep       Needles:  Injection technique: Single-shot  Needle Type: Echogenic Needle     Needle Length: 5cm  Needle Gauge: 21     Additional Needles:   Narrative:  Start time: 10/19/2023 9:38 AM End time: 10/19/2023 9:41 AM Injection made incrementally with aspirations every 5 mL.  Performed by: Personally  Anesthesiologist: Lucious Debby BRAVO, MD  Additional Notes: No pain on injection. No increased resistance to injection. Injection made in 5cc increments. Good needle visualization. Patient tolerated the procedure well.

## 2023-10-19 NOTE — Interval H&P Note (Signed)
 History and Physical Interval Note:  10/19/2023 9:12 AM  Deanna Burke  has presented today for surgery, with the diagnosis of RIGHT shoulder rotator cuff arthropathy.  The various methods of treatment have been discussed with the patient and family. After consideration of risks, benefits and other options for treatment, the patient has consented to  Procedure(s): ARTHROPLASTY, SHOULDER, TOTAL, REVERSE (Right) as a surgical intervention.  The patient's history has been reviewed, patient examined, no change in status, stable for surgery.  I have reviewed the patient's chart and labs.  Questions were answered to the patient's satisfaction.     [name]

## 2023-10-19 NOTE — Anesthesia Procedure Notes (Signed)
 Procedure Name: Intubation Date/Time: 10/19/2023 11:28 AM  Performed by: Cleon Thoma D, CRNAPre-anesthesia Checklist: Patient identified, Emergency Drugs available, Suction available and Patient being monitored Patient Re-evaluated:Patient Re-evaluated prior to induction Oxygen Delivery Method: Circle system utilized Preoxygenation: Pre-oxygenation with 100% oxygen Induction Type: IV induction Ventilation: Mask ventilation without difficulty Laryngoscope Size: Mac and 4 Grade View: Grade II Tube type: Oral Tube size: 7.0 mm Number of attempts: 1 Airway Equipment and Method: Stylet and Oral airway Placement Confirmation: ETT inserted through vocal cords under direct vision, positive ETCO2 and breath sounds checked- equal and bilateral Secured at: 22 cm Tube secured with: Tape Dental Injury: Teeth and Oropharynx as per pre-operative assessment

## 2023-10-19 NOTE — Discharge Instructions (Addendum)
 Ice to the shoulder constantly.  Keep the incision covered and clean and dry for one week, then ok to remove the Aquacel bandage and to get it wet in the shower.  Do exercise as instructed several times per day.  DO NOT reach behind your back or push up out of a chair with the operative arm.  Use a sling while you are up and around for comfort, may remove while seated.  Keep pillow propped behind the operative elbow.  Follow up with Dr Kay in two weeks in the office, call [phone] for appt  Please call Dr Kay (cell) [phone] with any questions or concerns

## 2023-10-19 NOTE — Anesthesia Postprocedure Evaluation (Signed)
 Anesthesia Post Note  Patient: Deanna Burke  Procedure(s) Performed: ARTHROPLASTY, SHOULDER, TOTAL, REVERSE (Right: Shoulder)     Patient location during evaluation: PACU Anesthesia Type: General Level of consciousness: awake and alert Pain management: pain level controlled Vital Signs Assessment: post-procedure vital signs reviewed and stable Respiratory status: spontaneous breathing, nonlabored ventilation and respiratory function stable Cardiovascular status: stable and blood pressure returned to baseline Anesthetic complications: no   No notable events documented.  Last Vitals:  Vitals:   10/19/23 1345 10/19/23 1400  BP: 100/70 107/60  Pulse: 82 80  Resp: (!) 27 (!) 22  Temp:    SpO2: 97% 95%    Last Pain:  Vitals:   10/19/23 1400  TempSrc:   PainSc: 0-No pain                 [name] Like

## 2023-10-19 NOTE — Anesthesia Preprocedure Evaluation (Addendum)
 Anesthesia Evaluation  Patient identified by MRN, date of birth, ID band Patient awake    Reviewed: Allergy & Precautions, NPO status , Patient's Chart, lab work & pertinent test results, reviewed documented beta blocker date and time   History of Anesthesia Complications Negative for: history of anesthetic complications  Airway Mallampati: II  TM Distance: >3 FB Neck ROM: Full    Dental  (+) Dental Advisory Given, Teeth Intact   Pulmonary sleep apnea (no longer on cpap) , PE   Pulmonary exam normal        Cardiovascular hypertension, Pt. on medications and Pt. on home beta blockers Normal cardiovascular exam     Neuro/Psych negative neurological ROS  negative psych ROS   GI/Hepatic Neg liver ROS, PUD,,, UC Ileostomy    Endo/Other   Obesity   Renal/GU CRFRenal disease     Musculoskeletal  (+) Arthritis ,    Abdominal   Peds  Hematology negative hematology ROS (+)   Anesthesia Other Findings   Reproductive/Obstetrics                             Anesthesia Physical Anesthesia Plan  ASA: 3  Anesthesia Plan: General   Post-op Pain Management: Regional block* and Tylenol  PO (pre-op)*   Induction: Intravenous  PONV Risk Score and Plan: 3 and Treatment may vary due to age or medical condition, Ondansetron  and Dexamethasone   Airway Management Planned: Oral ETT  Additional Equipment: None  Intra-op Plan:   Post-operative Plan: Extubation in OR  Informed Consent: I have reviewed the patients History and Physical, chart, labs and discussed the procedure including the risks, benefits and alternatives for the proposed anesthesia with the patient or authorized representative who has indicated his/her understanding and acceptance.     Dental advisory given  Plan Discussed with: CRNA and Anesthesiologist  Anesthesia Plan Comments:         Anesthesia Quick Evaluation

## 2023-10-20 DIAGNOSIS — M75101 Unspecified rotator cuff tear or rupture of right shoulder, not specified as traumatic: Secondary | ICD-10-CM | POA: Diagnosis not present

## 2023-10-20 MED ORDER — LOSARTAN POTASSIUM 25 MG PO TABS
25.0000 mg | ORAL_TABLET | Freq: Every day | ORAL | Status: DC
  Administered 2023-10-20: 25 mg via ORAL
  Filled 2023-10-20: qty 1

## 2023-10-20 NOTE — Progress Notes (Signed)
 Orthopedics Progress Note  Subjective: Right shoulder still numb from block.   Objective:  Vitals:   10/20/23 0131 10/20/23 0538  BP: 115/70 (!) 117/59  Pulse: 92 85  Resp: 18 18  Temp: 97.6 F (36.4 C) 97.9 F (36.6 C)  SpO2: 93% 93%    General: Awake and alert  Musculoskeletal: Right shoulder incision CDI. Bandage changed to Aquacel. Min swelling and bruising Neurovascularly intact  Lab Results  Component Value Date   WBC 4.8 10/08/2023   HGB 14.3 10/08/2023   HCT 44.5 10/08/2023   MCV 97.4 10/08/2023   PLT 252 10/08/2023       Component Value Date/Time   NA 139 10/08/2023 1121   NA 137 11/12/2015 0000   NA 140 05/03/2015 1402   K 4.1 10/08/2023 1121   K 4.0 05/03/2015 1402   CL 108 10/08/2023 1121   CL 105 10/09/2012 1103   CO2 21 (L) 10/08/2023 1121   CO2 25 05/03/2015 1402   GLUCOSE 96 10/08/2023 1121   GLUCOSE 96 05/03/2015 1402   GLUCOSE 117 (H) 10/09/2012 1103   BUN 21 10/08/2023 1121   BUN 25 (A) 11/12/2015 0000   BUN 20.6 05/03/2015 1402   CREATININE 1.27 (H) 10/08/2023 1121   CREATININE 1.21 (H) 09/19/2017 0945   CREATININE 1.5 (H) 05/03/2015 1402   CALCIUM  9.8 10/08/2023 1121   CALCIUM  10.1 05/03/2015 1402   GFRNONAA 44 (L) 10/08/2023 1121   GFRNONAA 46 (L) 09/19/2017 0945   GFRAA 57 (L) 06/18/2019 1052   GFRAA 53 (L) 09/19/2017 0945    Lab Results  Component Value Date   INR 0.99 03/08/2016   INR 0.96 10/28/2015   INR 1.04 03/25/2015    Assessment/Plan: POD #1 s/p Procedure(s): ARTHROPLASTY, SHOULDER, TOTAL, REVERSE Stable this AM. D/C to home after therapy Follow up in two weeks in the office  Elspeth R. Kay, MD 10/20/2023 8:10 AM

## 2023-10-20 NOTE — Plan of Care (Signed)
   Problem: Education: Goal: Knowledge of General Education information will improve Description Including pain rating scale, medication(s)/side effects and non-pharmacologic comfort measures Outcome: Progressing   Problem: Health Behavior/Discharge Planning: Goal: Ability to manage health-related needs will improve Outcome: Progressing

## 2023-10-20 NOTE — Discharge Summary (Signed)
 In most cases prophylactic antibiotics for Dental procdeures after total joint surgery are not necessary.  Exceptions are as follows:  1. History of prior total joint infection  2. Severely immunocompromised (Organ Transplant, cancer chemotherapy, Rheumatoid biologic meds such as Humera)  3. Poorly controlled diabetes (A1C &gt; 8.0, blood glucose over 200)  If you have one of these conditions, contact your surgeon for an antibiotic prescription, prior to your dental procedure. Orthopedic Discharge Summary        Physician Discharge Summary  Patient ID: Deanna Burke MRN: [id number] DOB/AGE: 1951/01/15 72 y.o.  Admit date: 10/19/2023 Discharge date: 10/20/2023   Procedures:  Procedure(s) (LRB): ARTHROPLASTY, SHOULDER, TOTAL, REVERSE (Right)  Attending Physician:  Dr. Elspeth Her  Admission Diagnoses:   Right shoulder RCT arthropathy  Discharge Diagnoses:  same   Past Medical History:  Diagnosis Date   Abnormal finding on cardiovascular stress test, ischemia anterolateral 02/13/2012   follow heart cath - pt told one tiny blockage that didn't even matter   Anemia 1/2/-21   Arthritis    hands, lumbar spine, hips, right ankle   Breast cancer (HCC)    Cancer (HCC)    DCIS L breast   Cataract    Left Eye   Complication of anesthesia 1996   pt has ileostomy and had surgery in 1996 that paralyzed   Fatigue    H/O nephrostomy 08/13/12   H/O ulcerative colitis    History of bone density study 2012   History of kidney stones 2015   nephrectomy   History of recurrent UTIs 02/13/2012   Hot flashes    Hypertension    Ileostomy in place, secondary to ulcerative colitis x 20 years 02/13/2012   Kidney disease, 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 anginal equivilant 02/13/2012   Torn rotator cuff 2005   Ulcerative colitis 1969-1973    PCP: Thedora Garnette HERO, MD   Discharged Condition: good  Hospital Course:  Patient underwent the above stated procedure on 10/19/2023. Patient tolerated the procedure well and brought to the recovery room in good condition and subsequently to the floor. Patient had an uncomplicated hospital course and was stable for discharge.   Disposition: Discharge disposition: 01-Home or Self Care      with follow up in 2 weeks    Follow-up Information     Her Kemps, MD Follow up in 2 week(s).   Specialty: Orthopedic Surgery Why: please call [phone] for appt in two weeks Contact information: [address] [place] [address] [phone]                 Dental Antibiotics:  In most cases prophylactic antibiotics for Dental procdeures after total joint surgery are not necessary.  Exceptions are as follows:  1. History of prior total joint infection  2. Severely immunocompromised (Organ Transplant, cancer chemotherapy, Rheumatoid biologic meds such as Humera)  3. Poorly controlled diabetes (A1C &gt; 8.0,  blood glucose over 200)  If you have one of these conditions, contact your surgeon for an antibiotic prescription, prior to your dental procedure.  Discharge Instructions     Call MD / Call 911   Complete by: As directed    If you experience chest pain or shortness of breath, CALL 911 and be transported to the hospital emergency room.  If you develope a fever above 101 F, pus (white drainage) or increased drainage or redness at the wound, or calf pain, call your surgeon's office.   Constipation Prevention   Complete by: As  directed    Drink plenty of fluids.  Prune juice may be helpful.  You may use a stool softener, such as Colace (over the counter) 100 mg twice a day.  Use MiraLax  (over the counter) for constipation as needed.   Diet - low sodium heart healthy   Complete by: As directed    Increase activity slowly as tolerated   Complete by: As directed    Post-operative opioid taper instructions:   Complete by: As directed    POST-OPERATIVE OPIOID TAPER INSTRUCTIONS: It is important to wean off of your opioid medication as soon as possible. If you do not need pain medication after your surgery it is ok to stop day one. Opioids include: Codeine, Hydrocodone (Norco, Vicodin), Oxycodone (Percocet, oxycontin ) and hydromorphone  amongst others.  Long term and even short term use of opiods can cause: Increased pain response Dependence Constipation Depression Respiratory depression And more.  Withdrawal symptoms can include Flu like symptoms Nausea, vomiting And more Techniques to manage these symptoms Hydrate well Eat regular healthy meals Stay active Use relaxation techniques(deep breathing, meditating, yoga) Do Not substitute Alcohol to help with tapering If you have been on opioids for less than two weeks and do not have pain than it is ok to stop all together.  Plan to wean off of opioids This plan should start within one week post op of your joint replacement. Maintain the same interval or time between taking each dose and first decrease the dose.  Cut the total daily intake of opioids by one tablet each day Next start to increase the time between doses. The last dose that should be eliminated is the evening dose.          Allergies as of 10/20/2023       Reactions   Demerol  Nausea And Vomiting   Dilaudid  [hydromorphone  Hcl] Nausea And Vomiting, Other (See Comments)   Muscle cramping   Reglan  [metoclopramide ] Other (See Comments)   Cramps in hands   Stadol [butorphanol Tartrate] Nausea  And Vomiting   Injection   Sulfamethoxazole  Nausea And Vomiting   Cephalexin Rash   Penicillins Rash   Rash only; note also rash to Keflex Has patient had a PCN reaction causing immediate rash, facial/tongue/throat swelling, SOB or lightheadedness with hypotension: rash Has patient had a PCN reaction causing severe rash involving mucus membranes or skin necrosis: no Has patient had a PCN reaction that required hospitalization no Has patient had a PCN reaction occurring within the last 10 years: no If all of the above answers are NO, then may proceed with Cephalosporin use.        Medication List     TAKE these medications    acetaminophen  650 MG CR tablet Commonly known as: TYLENOL  Take 650-1,300 mg by mouth every 8 (eight) hours as needed for pain.   allopurinol  300 MG tablet Commonly known as: ZYLOPRIM  Take 1 tablet (300 mg total)  by mouth daily.   Bismuth  Subgallate 200 MG Chew Chew 600 mg by mouth in the morning, at noon, in the evening, and at bedtime.   cholecalciferol 25 MCG (1000 UNIT) tablet Commonly known as: VITAMIN D3 Take 1,000 Units by mouth in the morning.   furosemide  20 MG tablet Commonly known as: LASIX  Take 1 tablet (20 mg total) by mouth daily.   losartan 50 MG tablet Commonly known as: COZAAR TAKE 1 TABLET BY MOUTH DAILY What changed:  how much to take when to take this   Melatonin 10 MG Tabs Take 10 mg by mouth at bedtime.   metoprolol  succinate 50 MG 24 hr tablet Commonly known as: TOPROL -XL Take 1 tablet (50 mg total) by mouth daily.   potassium chloride  SA 20 MEQ tablet Commonly known as: KLOR-CON  M Take 1 tablet (20 mEq total) by mouth daily.   PROBIOTIC DAILY PO Take 1 capsule by mouth in the morning.   traMADol  50 MG tablet Commonly known as: Ultram  Take 1 tablet (50 mg total) by mouth every 6 (six) hours as needed.   trimethoprim  100 MG tablet Commonly known as: TRIMPEX  Take 100 mg by mouth at bedtime.           Signed: [name] 10/20/2023, 8:12 AM  [facility] is now [facility] [address], [address], [place], [place] [address] Phone: [phone] Facebook  Instagram  [employer]

## 2023-10-20 NOTE — Care Management Obs Status (Signed)
 MEDICARE OBSERVATION STATUS NOTIFICATION   Patient Details  Name: Deanna Burke MRN: [id number] Date of Birth: 07-Sep-1950   Medicare Observation Status Notification Given:  Yes    Kyler Germer LILLETTE Fenton, LCSW 10/20/2023, 9:18 AM

## 2023-10-20 NOTE — Evaluation (Signed)
 Occupational Therapy Evaluation Patient Details Name: Deanna Burke MRN: [id number] DOB: Sep 06, 1950 Today's Date: 10/20/2023   History of Present Illness   Right reverse total shoulder arthroplasty 10/19/23. h/o R TKA with hx of L TKA, ileostomy (current), breast cancer     Clinical Impressions Pt is a 73 year old female, s/p reverse shoulder replacement without functional use of RT dominant upper extremity secondary to effects of surgery and interscalene block and shoulder precautions. Therapist provided education and instruction to patient in regards to exercises, precautions, positioning, donning upper extremity clothing and bathing while maintaining shoulder precautions, ice and edema management and donning/doffing sling. Patient verbalized understanding and demonstrated as needed. Patient needed assistance to donn shirt, to perform her ostomy care until full feeling in RUE returns, and deferred LBD to RN as pt wanted to wait until post-ostomy care, so pt was educated on tips to make LBD easier for her.  Pt provided with instruction on compensatory strategies to perform ADLs and to correctly don sling herself. Patient limited by decreased ROM in RT shoulder so therefore will need some form of assistance at home. Patient lives alone and will have a little help from friends.  Pt does need some assistance, so home health PT and OT recommended to keep addressing and problem solving methods to optimize her independence and safety at home. Pt verbalized and/or demonstrated understanding to all instruction. Patient to follow up with MD for further therapy needs.   ?      If plan is discharge home, recommend the following:   A little help with walking and/or transfers;A little help with bathing/dressing/bathroom;Assistance with cooking/housework;Assist for transportation     Functional Status Assessment   Patient has had a recent decline in their functional status and demonstrates the  ability to make significant improvements in function in a reasonable and predictable amount of time.     Equipment Recommendations   Other (comment) (Button hook. Pt aware)     Recommendations for Other Services         Precautions/Restrictions   Precautions Precautions: Fall;Shoulder Type of Shoulder Precautions: rTSA Shoulder Interventions: Shoulder sling/immobilizer Precaution Booklet Issued: Yes (comment) Recall of Precautions/Restrictions: Intact Precaution/Restrictions Comments: Sling at all times except ADL/exerciseYesNon weight bearingYesAROM elbow, wrist and hand to toleranceYesPROM of shoulderNoAROM of shoulderNoOT Consult Special Instructionsok for gentle ADLs Required Braces or Orthoses: Sling Restrictions Weight Bearing Restrictions Per Provider Order: Yes RUE Weight Bearing Per Provider Order: Non weight bearing     Mobility Bed Mobility Overal bed mobility: Modified Independent             General bed mobility comments: HOB elevated. Pt with 2 lift recliners at home.    Transfers                          Balance Overall balance assessment: Mild deficits observed, not formally tested                                         ADL either performed or assessed with clinical judgement   ADL Overall ADL's : (P) Needs assistance/impaired Eating/Feeding: Modified independent Eating/Feeding Details (indicate cue type and reason): Pt reported using teeth to open things on hospital tray. Grooming: Wash/dry hands;Standing;Modified independent   Upper Body Bathing: Supervision/ safety;Cueing for compensatory techniques;Cueing for UE precautions;Sitting;Adhering to UE precautions   Lower Body Bathing:  Supervison/ safety;Sitting/lateral leans;Cueing for compensatory techniques   Upper Body Dressing : Minimal assistance;Sitting;Cueing for UE precautions;Cueing for compensatory techniques;Cueing for sequencing Upper Body Dressing  Details (indicate cue type and reason): Pt required assistance today with buttons. Pt shown button hook as solution at home. Pt also educated on sequence for overhead shirts or dresses. Lower Body Dressing: Set up;Supervision/safety;Sit to/from stand;Sitting/lateral leans   Toilet Transfer: Supervision/safety;Ambulation   Toileting- Clothing Manipulation and Hygiene: Modified independent;Sitting/lateral lean;Sit to/from stand       Functional mobility during ADLs: Supervision/safety       Vision Baseline Vision/History: 1 Wears glasses Ability to See in Adequate Light: 0 Adequate Patient Visual Report: No change from baseline       Perception         Praxis         Pertinent Vitals/Pain Pain Assessment Pain Assessment: Faces Faces Pain Scale: Hurts a little bit Pain Location: RT shoulder while adjusting sling. Pain Intervention(s): Limited activity within patient's tolerance, Monitored during session, Premedicated before session     Extremity/Trunk Assessment Upper Extremity Assessment Upper Extremity Assessment: RUE deficits/detail;LUE deficits/detail RUE: Unable to fully assess due to immobilization RUE Sensation: decreased light touch LUE Deficits / Details: Pt reports LT UE is limited due to same fall that necessitated RT shoulder sx. Pt is able to show full ROM with LUE as needed for self care.   Lower Extremity Assessment Lower Extremity Assessment: Overall WFL for tasks assessed       Communication Communication Communication: No apparent difficulties   Cognition Arousal: Alert Behavior During Therapy: WFL for tasks assessed/performed Cognition: No apparent impairments                               Following commands: Intact       Cueing  General Comments   Cueing Techniques: Visual cues;Verbal cues      Exercises Other Exercises Other Exercises: Pt ed on post-op hand/wrist/forearm/elbow exercise per Dr. Annette protocol.    Shoulder Instructions Shoulder Instructions Donning/doffing shirt without moving shoulder: Minimal assistance;Patient able to independently direct caregiver Method for sponge bathing under operated UE: Supervision/safety;Patient able to independently direct caregiver Donning/doffing sling/immobilizer: Supervision/safety;Patient able to independently direct caregiver Correct positioning of sling/immobilizer: Modified independent;Patient able to independently direct caregiver ROM for elbow, wrist and digits of operated UE: Independent;Patient able to independently direct caregiver Sling wearing schedule (on at all times/off for ADL's): Independent;Patient able to independently direct caregiver Proper positioning of operated UE when showering: Patient able to independently direct caregiver;Modified independent Positioning of UE while sleeping: Modified independent;Patient able to independently direct caregiver    Home Living Family/patient expects to be discharged to:: Private residence Living Arrangements: Non-relatives/Friends Available Help at Discharge: Available PRN/intermittently;Friend(s) Type of Home: House Home Access: Stairs to enter [employer] of Steps: 3 Entrance Stairs-Rails:  (back door with pull bar) Home Layout: One level     Bathroom Shower/Tub: Chief Strategy Officer: Handicapped height     Home Equipment: None;Shower seat          Prior Functioning/Environment Prior Level of Function : Independent/Modified Independent;Driving;History of Falls (last six months)             Mobility Comments: Fall caused RT shoulder injury. Independent with all mobility without use of AD.      OT Problem List: Decreased strength;Decreased range of motion;Impaired UE functional use   OT Treatment/Interventions:  OT Goals(Current goals can be found in the care plan section)   Acute Rehab OT Goals Patient Stated Goal: To be as independent  as possible living alone at home. Pt agreeable to [facility] PT and OT to support this. OT Goal Formulation: All assessment and education complete, DC therapy Potential to Achieve Goals: Good ADL Goals Additional ADL Goal #1: Pt will demonstrate UE/LE dressing, donning/doffing of sling, correct positioning of RT UE, and compensatory strategies for RT axilla hygiene, as well as use of Ice, and understanding of HEP while correctly following all shoulder post-op precautions/restrictions.   OT Frequency:       Co-evaluation              AM-PAC OT 6 Clicks Daily Activity     Outcome Measure Help from another person eating meals?: None Help from another person taking care of personal grooming?: None Help from another person toileting, which includes using toliet, bedpan, or urinal?: None Help from another person bathing (including washing, rinsing, drying)?: A Little Help from another person to put on and taking off regular upper body clothing?: A Little Help from another person to put on and taking off regular lower body clothing?: A Little 6 Click Score: 21   End of Session Nurse Communication: Other (comment) (Coordinated visit with RN. Pt will need some assist with ostomy care and asked to wait to don pants until then.SABRA)  Activity Tolerance: Patient tolerated treatment well Patient left: with call bell/phone within reach (EOB)  OT Visit Diagnosis: Unsteadiness on feet (R26.81);Muscle weakness (generalized) (M62.81);History of falling (Z91.81);Pain Pain - Right/Left: Right Pain - part of body: Shoulder                Time: 9064-8984 OT Time Calculation (min): 40 min Charges:  OT General Charges $OT Visit: 1 Visit OT Evaluation $OT Eval Low Complexity: 1 Low OT Treatments $Self Care/Home Management : 23-37 mins Delon, OT Acute Rehab Services Office: [phone] 10/20/2023   Delon Falter 10/20/2023, 10:31 AM

## 2023-10-20 NOTE — TOC Progression Note (Signed)
 Transition of Care [facility]) - Progression Note    Patient Details  Name: Deanna Burke MRN: [id number] Date of Birth: March 09, 1951  Transition of Care [facility]) CM/SW Contact  [name], LCSW Phone Number: 10/20/2023, 11:03 AM  Clinical Narrative:    Pt evaluated and recommended for [facility] OT/PT.  CSW visited pt, she agreed to recommendation, had no choice. CSW agreed to look for potential providers. [facility] accepted pt and CSW added to AVS.  CSW followed up with pt advised [facility] will call. No further TOC needs.      Barriers to Discharge: No Barriers Identified  Expected Discharge Plan and Services         Expected Discharge Date: 10/20/23                                     Social Determinants of Health (SDOH) Interventions SDOH Screenings   Food Insecurity: No Food Insecurity (10/19/2023)  Housing: Low Risk  (10/19/2023)  Transportation Needs: No Transportation Needs (10/19/2023)  Utilities: Not At Risk (10/19/2023)  Alcohol Screen: Low Risk  (12/01/2022)  Depression (PHQ2-9): Low Risk  (07/23/2023)  Financial Resource Strain: Low Risk  (05/14/2023)  Physical Activity: Inactive (05/14/2023)  Social Connections: Moderately Isolated (10/19/2023)  Stress: No Stress Concern Present (05/14/2023)  Tobacco Use: Low Risk  (10/19/2023)  Health Literacy: Adequate Health Literacy (12/01/2022)    Readmission Risk Interventions     No data to display

## 2023-10-21 ENCOUNTER — Other Ambulatory Visit: Payer: Self-pay | Admitting: Family Medicine

## 2023-10-22 ENCOUNTER — Ambulatory Visit: Admitting: Family Medicine

## 2023-10-22 ENCOUNTER — Encounter (HOSPITAL_COMMUNITY): Payer: Self-pay | Admitting: Orthopedic Surgery

## 2023-10-27 ENCOUNTER — Ambulatory Visit
Admission: EM | Admit: 2023-10-27 | Discharge: 2023-10-27 | Disposition: A | Discharge status: Home / Self Care | Attending: Family Medicine | Admitting: Family Medicine

## 2023-10-27 ENCOUNTER — Encounter: Payer: Self-pay | Admitting: Emergency Medicine

## 2023-10-27 DIAGNOSIS — N3000 Acute cystitis without hematuria: Secondary | ICD-10-CM | POA: Insufficient documentation

## 2023-10-27 LAB — POCT URINALYSIS DIP (MANUAL ENTRY)
Bilirubin, UA: NEGATIVE
Glucose, UA: NEGATIVE mg/dL
Ketones, POC UA: NEGATIVE mg/dL
Nitrite, UA: POSITIVE — AB
Protein Ur, POC: 30 mg/dL — AB
Spec Grav, UA: >=1.03 — AB (ref 1.010–1.025)
Urobilinogen, UA: 0.2 U/dL
pH, UA: 6 (ref 5.0–8.0)

## 2023-10-27 MED ORDER — CIPROFLOXACIN HCL 500 MG PO TABS: 500.0000 mg | ORAL_TABLET | Freq: Two times a day (BID) | ORAL | 0 refills | Status: AC

## 2023-10-27 NOTE — ED Triage Notes (Signed)
 Pt c/o urinary frequency and burning for 1 day. States she had kidney removed in 2015 and was put on daily trimpex  and probiotic about 4 years ago to prevent UTIs.  She had uti back in May

## 2023-10-27 NOTE — ED Provider Notes (Signed)
 [facility]    CSN: [id number] Arrival date & time: 10/27/23  1238      History   Chief Complaint No chief complaint on file.   HPI Deanna Burke is a 73 y.o. female.   Presents to clinic over concern of suprapubic discomfort, urgency, frequency and dysuria that started yesterday.  She had a kidney removed in 2015 and has chronic kidney disease.  Does have a history of reoccurring UTIs and follows with urology, she is on daily trimprex and a probiotic.   Recently had a UTI in May that was treated with Macrobid  twice daily for 10 days.  The main liquid she drinks is water , has increased her water  intake since yesterday.  Does not have any abdominal pain, fever, nausea, vomiting or chills.  Does have some flank pain which is typical for her urinary tract infections.  The history is provided by the patient and medical records.    Past Medical History:  Diagnosis Date   Abnormal finding on cardiovascular stress test, ischemia anterolateral 02/13/2012   follow heart cath - pt told one tiny blockage that didn't even matter   Anemia 1/2/-21   Arthritis    hands, lumbar spine, hips, right ankle   Breast cancer (HCC)    Cancer (HCC)    DCIS L breast   Cataract    Left Eye   Complication of anesthesia 1996   pt has ileostomy and had surgery in 1996 that paralyzed   Fatigue    H/O nephrostomy 08/13/12   H/O ulcerative colitis    History of bone density study 2012   History of kidney stones 2015   nephrectomy   History of recurrent UTIs 02/13/2012   Hot flashes    Hypertension    Ileostomy in place, secondary to ulcerative colitis x 20 years 02/13/2012   Kidney disease, with 15% use of Rt kidney due to congential  02/13/2012   Large bowel perforation (HCC) 1971/1996   Personal history of radiation therapy    Phlebitis 1971   Pulmonary embolus (HCC) 1971   S/P ileostomy (HCC)    S/P radiation therapy 07/24/11 - 09/06/11   Left Breast/ 5000 cGy in 25 Fractions  with Boost of 1000 cGy in 5 Fractions   Scoliosis    Sleep apnea 2009   uses cpap-setting is 1   Small bowel obstruction (HCC) 1974-1989   Small bowel perforation (HCC) 1971/1996   Syncope, ? anginal equivilant 02/13/2012   Torn rotator cuff 2005   Ulcerative colitis 1969-1973    Patient Active Problem List   Diagnosis Date Noted   S/P shoulder replacement, right 10/19/2023   Primary osteoarthritis of both shoulders 07/23/2023   Abrasion of face 05/14/2023   Injury of left shoulder 05/14/2023   Small bowel obstruction (HCC) 12/30/2017   History of chronic kidney disease 10/08/2017   History of kidney stones 10/08/2017   History of osteoporosis 09/19/2017   Vitamin D  insufficiency 09/19/2017   Hypercholesterolemia 09/19/2017   Other insomnia 09/19/2017   Recurrent UTI 09/19/2017   Primary osteoarthritis of both hands 09/19/2017   S/P TKR (total knee replacement), bilateral 09/19/2017   Ulcerative colitis (HCC) 05/10/2015   H/O unilateral nephrectomy 05/10/2015   Essential hypertension 04/07/2015   History of pulmonary embolism    Malignant neoplasm of upper-outer quadrant of left breast in female, estrogen receptor positive (HCC) 04/07/2013   Neoplasm of left breast, primary tumor staging category Tis: ductal carcinoma in situ (DCIS) 01/27/2013  Pyelonephritis, acute 06/05/2012   Hydronephrosis of right kidney 06/05/2012   Hyponatremia 06/05/2012   Hypokalemia 06/05/2012   Acute kidney injury (HCC) 06/05/2012   Stage 3b chronic kidney disease (CKD) (HCC) 02/13/2012   Abnormal finding on cardiovascular stress test, ischemia anterolateral 02/13/2012   Syncope, ? anginal equivilant 02/13/2012   Ileostomy in place, secondary to ulcerative colitis x 20 years 02/13/2012    Past Surgical History:  Procedure Laterality Date   ABDOMINAL HYSTERECTOMY  1996   partial   APPENDECTOMY     BREAST BIOPSY     BREAST LUMPECTOMY  05/31/11   LEFT BREAST LUMPECTOMY, NEGATIVE MARGINS, HIGH  GRADE  DUCTAL  CARCINOMA IN SITU WITH ASSOCIATED CALCIFICATIONS.  ER:+, PR+,    BREAST SURGERY  05/03/11   LEFT BREAST NEEDLE CORE BIOPSY- DCIS   CARDIAC CATHETERIZATION     CATARACT EXTRACTION Left    CHOLECYSTECTOMY N/A 06/19/2019   Procedure: LAPAROSCOPIC CHOLECYSTECTOMY;  Surgeon: Vernetta Berg, MD;  Location: [facility];  Service: General;  Laterality: N/A;   COLECTOMY  1973   EXPLORATORY LAPAROTOMY  1978/1990   with lysis of adhesions   exploratory laps     several due to abd pain related to ulcerative colitis   EYE SURGERY  05/08/2011   left cataract removal    ILEOSTOMY  1973   IR GENERIC HISTORICAL  03/08/2016   IR VENOCAVAGRAM IVC 03/08/2016 Toribio Faes, MD [facility]   IR GENERIC HISTORICAL  03/08/2016   IR US  GUIDE VASC ACCESS RIGHT 03/08/2016 Toribio Faes, MD [facility]   JOINT REPLACEMENT     KNEE ARTHROPLASTY     LAPAROSCOPIC NEPHRECTOMY Right 08/14/2012   Procedure: RIGHT LAPAROSCOPIC RETROPERITONEAL LAPAROSCOPIC NEPHRECTOMY ;  Surgeon: Ricardo Likens, MD;  Location: [facility];  Service: Urology;  Laterality: Right;  RIGHT LAPAROSCOPIC RETROPERITONEAL LAPAROSCOPIC NEPHRECTOMY, POSSIBLE OPEN    LEFT HEART CATHETERIZATION WITH CORONARY ANGIOGRAM N/A 02/13/2012   Procedure: LEFT HEART CATHETERIZATION WITH CORONARY ANGIOGRAM;  Surgeon: Dorn JINNY Lesches, MD;  Location: [facility] CATH LAB;  Service: Cardiovascular;  Laterality: N/A;   REVERSE SHOULDER ARTHROPLASTY Right 10/19/2023   Procedure: ARTHROPLASTY, SHOULDER, TOTAL, REVERSE;  Surgeon: Kay Kemps, MD;  Location: [facility];  Service: Orthopedics;  Laterality: Right;   ROTATOR CUFF REPAIR  2006   Right   TOTAL KNEE ARTHROPLASTY Left 04/01/2015   Procedure: LEFT TOTAL KNEE ARTHROPLASTY;  Surgeon: Reyes Billing, MD;  Location: [facility];  Service: Orthopedics;  Laterality: Left;   TOTAL KNEE ARTHROPLASTY Right 11/04/2015   Procedure: RIGHT TOTAL KNEE ARTHROPLASTY;  Surgeon: Reyes Billing, MD;  Location: [facility];  Service:  Orthopedics;  Laterality: Right;   TOTAL SHOULDER ARTHROPLASTY     VAGINOPLASTY  1975    OB History   No obstetric history on file.      Home Medications    Prior to Admission medications   Medication Sig Start Date End Date Taking? Authorizing Provider  ciprofloxacin  (CIPRO ) 500 MG tablet Take 1 tablet (500 mg total) by mouth 2 (two) times daily for 5 days. 10/27/23 11/01/23 Yes Rebeckah Masih  N, FNP  acetaminophen  (TYLENOL ) 650 MG CR tablet Take 650-1,300 mg by mouth every 8 (eight) hours as needed for pain.    [provider]  allopurinol  (ZYLOPRIM ) 300 MG tablet Take 1 tablet (300 mg total) by mouth daily. 09/03/23   Thedora Garnette HERO, MD  Bismuth  Subgallate 200 MG CHEW Chew 600 mg by mouth in the morning, at noon, in the evening, and at bedtime.  [provider]  cholecalciferol  (VITAMIN D3) 25 MCG (1000 UNIT) tablet Take 1,000 Units by mouth in the morning.    [provider]  furosemide  (LASIX ) 20 MG tablet Take 1 tablet (20 mg total) by mouth daily. 10/16/23   Thedora Garnette HERO, MD  losartan  (COZAAR ) 50 MG tablet TAKE 1 TABLET BY MOUTH DAILY Patient taking differently: Take 25 mg by mouth every evening. 05/06/23   Thedora Garnette HERO, MD  Melatonin 10 MG TABS Take 10 mg by mouth at bedtime. 07/15/20   [provider]  metoprolol  succinate (TOPROL -XL) 50 MG 24 hr tablet TAKE 1 TABLET BY MOUTH DAILY 10/22/23   Thedora Garnette HERO, MD  potassium chloride  SA (KLOR-CON  M) 20 MEQ tablet Take 1 tablet (20 mEq total) by mouth daily. 10/02/23   Thedora Garnette HERO, MD  Probiotic Product (PROBIOTIC DAILY PO) Take 1 capsule by mouth in the morning.    [provider]  traMADol  (ULTRAM ) 50 MG tablet Take 1 tablet (50 mg total) by mouth every 6 (six) hours as needed. 10/19/23 10/18/24  Kay Kemps, MD  trimethoprim  (TRIMPEX ) 100 MG tablet Take 100 mg by mouth at bedtime.    [provider]    Family History Family History  Problem Relation Age of Onset    Atrial fibrillation Mother    Heart disease Father    Heart attack Father    Atrial fibrillation Brother    Heart attack Paternal Uncle    Breast cancer Maternal Grandmother    Stomach cancer Maternal Grandfather    Cancer Paternal Grandmother        stomach   Cancer Other        Breast    Social History Social History   Tobacco Use   Smoking status: Never   Smokeless tobacco: Never  Vaping Use   Vaping status: Never Used  Substance Use Topics   Alcohol use: No   Drug use: No     Allergies   Demerol , Dilaudid  [hydromorphone  hcl], Reglan  [metoclopramide ], Stadol [butorphanol tartrate], Sulfamethoxazole , Cephalexin, and Penicillins   Review of Systems Review of Systems  Per HPI  Physical Exam Triage Vital Signs ED Triage Vitals  Encounter Vitals Group     BP 10/27/23 1246 126/65     Girls Systolic BP Percentile --      Girls Diastolic BP Percentile --      Boys Systolic BP Percentile --      Boys Diastolic BP Percentile --      Pulse Rate 10/27/23 1246 93     Resp 10/27/23 1244 17     Temp 10/27/23 1246 98.1 F (36.7 C)     Temp src --      SpO2 10/27/23 1246 95 %     Weight --      Height --      Head Circumference --      Peak Flow --      Pain Score 10/27/23 1246 0     Pain Loc --      Pain Education --      Exclude from Growth Chart --    No data found.  Updated Vital Signs BP 126/65 (BP Location: Left Arm)   Pulse 93   Temp 98.1 F (36.7 C)   Resp 17   SpO2 95%   Visual Acuity Right Eye Distance:   Left Eye Distance:   Bilateral Distance:    Right Eye Near:   Left Eye Near:  Bilateral Near:     Physical Exam Vitals and nursing note reviewed.  Constitutional:      Appearance: Normal appearance.  HENT:     Head: Normocephalic and atraumatic.     Nose: Nose normal.     Mouth/Throat:     Mouth: Mucous membranes are moist.  Eyes:     Conjunctiva/sclera: Conjunctivae normal.  Cardiovascular:     Rate and Rhythm: Normal rate.   Pulmonary:     Effort: Pulmonary effort is normal. No respiratory distress.  Abdominal:     Tenderness: There is no right CVA tenderness or left CVA tenderness.  Neurological:     General: No focal deficit present.     Mental Status: She is alert and oriented to person, place, and time.  Psychiatric:        Mood and Affect: Mood normal.        Behavior: Behavior normal.      UC Treatments / Results  Labs (all labs ordered are listed, but only abnormal results are displayed) Labs Reviewed  POCT URINALYSIS DIP (MANUAL ENTRY) - Abnormal; Notable for the following components:      Result Value   Color, UA straw (*)    Clarity, UA cloudy (*)    Spec Grav, UA >=1.030 (*)    Blood, UA trace-intact (*)    Protein Ur, POC =30 (*)    Nitrite, UA Positive (*)    Leukocytes, UA Small (1+) (*)    All other components within normal limits  URINE CULTURE    EKG   Radiology No results found.  Procedures Procedures (including critical care time)  Medications Ordered in UC Medications - No data to display  Initial Impression / Assessment and Plan / UC Course  I have reviewed the triage vital signs and the nursing notes.  Pertinent labs & imaging results that were available during my care of the patient were reviewed by me and considered in my medical decision making (see chart for details).  Vitals and triage reviewed, patient is hemodynamically stable.  Without signs of pyelonephritis such as fever, tachycardia, CVA tenderness, nausea or vomiting.  UA is concerning for UTI with trace red blood cells, positive nitrites and small leukocytes.  Will cover with Cipro  and send for culture.  Staff will contact patient if treatment modification is needed.  Plan of care, follow-up care return precautions given, no questions at this time.      Final Clinical Impressions(s) / UC Diagnoses   Final diagnoses:  Acute cystitis without hematuria     Discharge Instructions      Thank  you for letting be be a part of your care today.   Your urine showed that you have a urinary tract infection.  We are treating you with Cipro , take this twice daily with food for the next 5 days.  Ensure you are drinking plenty of water  to help flush your kidneys.  We are sending your urine off for culture and we will contact you if we need to modify your antibiotic therapy.    ED Prescriptions     Medication Sig Dispense Auth. Provider   ciprofloxacin  (CIPRO ) 500 MG tablet Take 1 tablet (500 mg total) by mouth 2 (two) times daily for 5 days. 10 tablet Dreama, Athony Coppa  N, FNP      PDMP not reviewed this encounter.   Dreama, Jakia Kennebrew  N, FNP 10/27/23 1323

## 2023-10-27 NOTE — Discharge Instructions (Addendum)
 Thank you for letting be be a part of your care today.   Your urine showed that you have a urinary tract infection.  We are treating you with Cipro , take this twice daily with food for the next 5 days.  Ensure you are drinking plenty of water  to help flush your kidneys.  We are sending your urine off for culture and we will contact you if we need to modify your antibiotic therapy.

## 2023-10-29 ENCOUNTER — Ambulatory Visit (HOSPITAL_COMMUNITY): Payer: Self-pay

## 2023-10-29 ENCOUNTER — Telehealth: Payer: Self-pay

## 2023-10-29 LAB — URINE CULTURE: Culture: >=100000 — AB

## 2023-10-29 NOTE — Telephone Encounter (Signed)
Left VM to rtn call. Dm/cma       

## 2023-10-29 NOTE — Telephone Encounter (Signed)
 Copied from CRM [id number]. Topic: Clinical - Medication Question >> Oct 25, 2023  1:14 PM Drema MATSU wrote: Reason for CRM: Signe Eastern stated that patient was just in the hospital and had surgery and it showed a level 2 interaction between a few of her medications. trimethoprim  (TRIMPEX ) 100 MG tablet react to potassium chloride  SA (KLOR-CON  M) 20 MEQ tablet trimethoprim  (TRIMPEX ) 100 MG tablet reacts to losartan  (COZAAR ) 50 MG tablet

## 2023-11-07 ENCOUNTER — Ambulatory Visit (INDEPENDENT_AMBULATORY_CARE_PROVIDER_SITE_OTHER): Admitting: Family Medicine

## 2023-11-07 ENCOUNTER — Encounter: Payer: Self-pay | Admitting: Family Medicine

## 2023-11-07 VITALS — BP 118/66 | HR 91 | Temp 97.0°F | Ht 63.0 in | Wt 177.0 lb

## 2023-11-07 DIAGNOSIS — N390 Urinary tract infection, site not specified: Secondary | ICD-10-CM

## 2023-11-07 DIAGNOSIS — I10 Essential (primary) hypertension: Secondary | ICD-10-CM | POA: Diagnosis not present

## 2023-11-07 DIAGNOSIS — Z96611 Presence of right artificial shoulder joint: Secondary | ICD-10-CM | POA: Diagnosis not present

## 2023-11-07 NOTE — Progress Notes (Signed)
 [facility] [address] [place] [address] Dept: [phone] Dept Fax: [phone]  Chronic Care Office Visit  Subjective:    Patient ID: Deanna Burke, female    DOB: 04/13/1951, 73 y.o..   MRN: [id number]  Chief Complaint  Patient presents with   Hypertension    3 month fu HTN.     History of Present Illness:  Patient is in today for reassessment of chronic medical issues.  Deanna Burke has a history of hypertension. She is managed on losartan  25 mg daily and metoprolol  succinate 50 mg daily. She does take some furosemide  for lower leg edema issues, but denies any edema currently.   Deanna Burke underwent a reverse shoulder joint arthoplasty on 6/27. She feels the shoulder is doing better at this point. She is excited to report that she has been able to return to piano playing. She is following restricitons put in place by her surgeon.  Deanna Burke has had two recent UTIs. She has compelted a course of nitrofurantoin . She has concerns about recurrence. She is not currently having symptoms.  Past Medical History: Patient Active Problem List   Diagnosis Date Noted   S/P shoulder replacement, right 10/19/2023   Primary osteoarthritis of both shoulders 07/23/2023   Abrasion of face 05/14/2023   Injury of left shoulder 05/14/2023   Small bowel obstruction (HCC) 12/30/2017   History of chronic kidney disease 10/08/2017   History of kidney stones 10/08/2017   History of osteoporosis 09/19/2017   Vitamin D  insufficiency 09/19/2017   Hypercholesterolemia 09/19/2017   Other insomnia 09/19/2017   Recurrent UTI 09/19/2017   Primary osteoarthritis of both hands 09/19/2017   S/P TKR (total knee replacement), bilateral 09/19/2017   Ulcerative colitis (HCC) 05/10/2015   H/O unilateral nephrectomy 05/10/2015   Essential hypertension 04/07/2015   History of pulmonary embolism    Malignant neoplasm of upper-outer quadrant of  left breast in female, estrogen receptor positive (HCC) 04/07/2013   Neoplasm of left breast, primary tumor staging category Tis: ductal carcinoma in situ (DCIS) 01/27/2013   Pyelonephritis, acute 06/05/2012   Hydronephrosis of right kidney 06/05/2012   Hyponatremia 06/05/2012   Hypokalemia 06/05/2012   Stage 3b chronic kidney disease (CKD) (HCC) 02/13/2012   Abnormal finding on cardiovascular stress test, ischemia anterolateral 02/13/2012   Syncope, ? anginal equivilant 02/13/2012   Ileostomy in place, secondary to ulcerative colitis x 20 years 02/13/2012   Past Surgical History:  Procedure Laterality Date   ABDOMINAL HYSTERECTOMY  1996   partial   APPENDECTOMY     BREAST BIOPSY     BREAST LUMPECTOMY  05/31/11   LEFT BREAST LUMPECTOMY, NEGATIVE MARGINS, HIGH GRADE  DUCTAL  CARCINOMA IN SITU WITH ASSOCIATED CALCIFICATIONS.  ER:+, PR+,    BREAST SURGERY  05/03/11   LEFT BREAST NEEDLE CORE BIOPSY- DCIS   CARDIAC CATHETERIZATION     CATARACT EXTRACTION Left    CHOLECYSTECTOMY N/A 06/19/2019   Procedure: LAPAROSCOPIC CHOLECYSTECTOMY;  Surgeon: Vernetta Berg, MD;  Location: [facility];  Service: General;  Laterality: N/A;   COLECTOMY  1973   EXPLORATORY LAPAROTOMY  1978/1990   with lysis of adhesions   exploratory laps     several due to abd pain related to ulcerative colitis   EYE SURGERY  05/08/2011   left cataract removal    ILEOSTOMY  1973   IR GENERIC HISTORICAL  03/08/2016   IR VENOCAVAGRAM IVC 03/08/2016 Toribio Faes, MD [facility]   IR GENERIC HISTORICAL  03/08/2016   IR US  GUIDE VASC ACCESS RIGHT 03/08/2016 Toribio Faes, MD [facility]   JOINT REPLACEMENT     KNEE ARTHROPLASTY     LAPAROSCOPIC NEPHRECTOMY Right 08/14/2012   Procedure: RIGHT LAPAROSCOPIC RETROPERITONEAL LAPAROSCOPIC NEPHRECTOMY ;  Surgeon: Ricardo Likens, MD;  Location: [facility];  Service: Urology;  Laterality: Right;  RIGHT LAPAROSCOPIC RETROPERITONEAL LAPAROSCOPIC NEPHRECTOMY, POSSIBLE OPEN    LEFT  HEART CATHETERIZATION WITH CORONARY ANGIOGRAM N/A 02/13/2012   Procedure: LEFT HEART CATHETERIZATION WITH CORONARY ANGIOGRAM;  Surgeon: Dorn JINNY Lesches, MD;  Location: [facility] CATH LAB;  Service: Cardiovascular;  Laterality: N/A;   REVERSE SHOULDER ARTHROPLASTY Right 10/19/2023   Procedure: ARTHROPLASTY, SHOULDER, TOTAL, REVERSE;  Surgeon: Kay Kemps, MD;  Location: [facility];  Service: Orthopedics;  Laterality: Right;   ROTATOR CUFF REPAIR  2006   Right   TOTAL KNEE ARTHROPLASTY Left 04/01/2015   Procedure: LEFT TOTAL KNEE ARTHROPLASTY;  Surgeon: Reyes Billing, MD;  Location: [facility];  Service: Orthopedics;  Laterality: Left;   TOTAL KNEE ARTHROPLASTY Right 11/04/2015   Procedure: RIGHT TOTAL KNEE ARTHROPLASTY;  Surgeon: Reyes Billing, MD;  Location: [facility];  Service: Orthopedics;  Laterality: Right;   TOTAL SHOULDER ARTHROPLASTY     VAGINOPLASTY  1975   Family History  Problem Relation Age of Onset   Atrial fibrillation Mother    Heart disease Father    Heart attack Father    Atrial fibrillation Brother    Heart attack Paternal Uncle    Breast cancer Maternal Grandmother    Stomach cancer Maternal Grandfather    Cancer Paternal Grandmother        stomach   Cancer Other        Breast   Outpatient Medications Prior to Visit  Medication Sig Dispense Refill   acetaminophen  (TYLENOL ) 650 MG CR tablet Take 650-1,300 mg by mouth every 8 (eight) hours as needed for pain.     allopurinol  (ZYLOPRIM ) 300 MG tablet Take 1 tablet (300 mg total) by mouth daily. 90 tablet 3   Bismuth  Subgallate 200 MG CHEW Chew 600 mg by mouth in the morning, at noon, in the evening, and at bedtime.     cholecalciferol  (VITAMIN D3) 25 MCG (1000 UNIT) tablet Take 1,000 Units by mouth in the morning.     furosemide  (LASIX ) 20 MG tablet Take 1 tablet (20 mg total) by mouth daily. 90 tablet 0   losartan  (COZAAR ) 50 MG tablet TAKE 1 TABLET BY MOUTH DAILY (Patient taking differently: Take 25 mg by mouth daily.) 90 tablet 3    Melatonin 10 MG TABS Take 10 mg by mouth at bedtime.     metoprolol  succinate (TOPROL -XL) 50 MG 24 hr tablet TAKE 1 TABLET BY MOUTH DAILY 90 tablet 1   nitrofurantoin  (MACRODANTIN ) 100 MG capsule Take 100 mg by mouth daily.     potassium chloride  SA (KLOR-CON  M) 20 MEQ tablet Take 1 tablet (20 mEq total) by mouth daily. 30 tablet 2   Probiotic Product (PROBIOTIC DAILY PO) Take 1 capsule by mouth in the morning.     traMADol  (ULTRAM ) 50 MG tablet Take 1 tablet (50 mg total) by mouth every 6 (six) hours as needed. 20 tablet 0   trimethoprim  (TRIMPEX ) 100 MG tablet Take 100 mg by mouth at bedtime.     No facility-administered medications prior to visit.   Allergies  Allergen Reactions   Demerol  Nausea And Vomiting   Dilaudid  [Hydromorphone  Hcl] Nausea And Vomiting and Other (See Comments)    Muscle  cramping   Reglan  [Metoclopramide ] Other (See Comments)    Cramps in hands   Stadol [Butorphanol Tartrate] Nausea And Vomiting    Injection   Sulfamethoxazole  Nausea And Vomiting   Cephalexin Rash   Penicillins Rash    Rash only; note also rash to Keflex Has patient had a PCN reaction causing immediate rash, facial/tongue/throat swelling, SOB or lightheadedness with hypotension: rash Has patient had a PCN reaction causing severe rash involving mucus membranes or skin necrosis: no Has patient had a PCN reaction that required hospitalization no Has patient had a PCN reaction occurring within the last 10 years: no If all of the above answers are NO, then may proceed with Cephalosporin use.   Objective:   Today's Vitals   11/07/23 1253  BP: 118/66  Pulse: 91  Temp: (!) 97 F (36.1 C)  TempSrc: Temporal  SpO2: 98%  Weight: 177 lb (80.3 kg)  Height: 5' 3 (1.6 m)   Body mass index is 31.35 kg/m.   General: Well developed, well nourished. No acute distress. Extremities: Surgical wound is healing well. No sign of infection. Psych: Alert and oriented. Normal mood and affect.  Health  Maintenance Due  Topic Date Due   Hepatitis C Screening  Never done   DTaP/Tdap/Td (1 - Tdap) Never done   Zoster Vaccines- Shingrix (1 of 2) Never done   Medicare Annual Wellness (AWV)  12/01/2023     Assessment & Plan:   Problem List Items Addressed This Visit       Cardiovascular and Mediastinum   Essential hypertension   Blood pressure is in good control. Continue losartan  25 mg 1/2 tab daily and metoprolol  succinate 50 mg daily.         Genitourinary   Recurrent UTI   Recent Klebisella infection. This should have responded to nitrofurantoin . I will check a urine culture to make sure this is cleared now.      Relevant Medications   nitrofurantoin  (MACRODANTIN ) 100 MG capsule   Other Relevant Orders   Urine Culture     Other   Status post reverse arthroplasty of right shoulder - Primary   Surgical wound healed. Continue to follow with orthopedics.       Return in about 3 months (around 02/07/2024) for Reassessment.   Garnette CHRISTELLA Simpler, MD

## 2023-11-07 NOTE — Assessment & Plan Note (Signed)
 Recent Klebisella infection. This should have responded to nitrofurantoin . I will check a urine culture to make sure this is cleared now.

## 2023-11-07 NOTE — Assessment & Plan Note (Signed)
 Blood pressure is in good control. Continue losartan  25 mg 1/2 tab daily and metoprolol  succinate 50 mg daily.

## 2023-11-07 NOTE — Assessment & Plan Note (Signed)
 Surgical wound healed. Continue to follow with orthopedics.

## 2023-11-08 LAB — URINE CULTURE
MICRO NUMBER:: 16706611
Result:: NO GROWTH
SPECIMEN QUALITY:: ADEQUATE

## 2023-11-09 ENCOUNTER — Ambulatory Visit: Payer: Self-pay | Admitting: Family Medicine

## 2023-11-29 ENCOUNTER — Telehealth: Payer: Self-pay

## 2023-11-29 DIAGNOSIS — I10 Essential (primary) hypertension: Secondary | ICD-10-CM

## 2023-11-29 NOTE — Telephone Encounter (Signed)
 Form on providers desk to review and make that decision.  Patient notified that we will call her if she needs to come back in for an appointment.  Dm/cma

## 2023-11-29 NOTE — Telephone Encounter (Signed)
 Copied from CRM #[id number]. Topic: General - Other >> Nov 29, 2023  3:42 PM Franky GRADE wrote: Reason for CRM: [facility] was [facility] by [facility] and informed she needed to have another shoulder replacement for her left shoulder this time but needs clearance from Dr.Rudd. She saw Dr.Rudd on 11/07/2023 and wanted to know if she needed to come in and see him again or if he can clear her for the procedure.

## 2023-12-03 ENCOUNTER — Ambulatory Visit (INDEPENDENT_AMBULATORY_CARE_PROVIDER_SITE_OTHER): Payer: Medicare Other

## 2023-12-03 DIAGNOSIS — Z0000 Encounter for general adult medical examination without abnormal findings: Secondary | ICD-10-CM | POA: Diagnosis not present

## 2023-12-03 NOTE — Patient Instructions (Signed)
 Ms. Kurtenbach , Thank you for taking time out of your busy schedule to complete your Annual Wellness Visit with me. I enjoyed our conversation and look forward to speaking with you again next year. I, as well as your care team,  appreciate your ongoing commitment to your health goals. Please review the following plan we discussed and let me know if I can assist you in the future. Your Game plan/ To Do List    Referrals: If you haven't heard from the office you've been referred to, please reach out to them at the phone provided.   Follow up Visits: We will see or speak with you next year for your Next Medicare AWV with our clinical staff Have you seen your provider in the last 6 months (3 months if uncontrolled diabetes)? Yes  Clinician Recommendations:  Aim for 30 minutes of exercise or brisk walking, 6-8 glasses of water , and 5 servings of fruits and vegetables each day.       This is a list of the screenings recommended for you:  Health Maintenance  Topic Date Due   Hepatitis C Screening  Never done   DTaP/Tdap/Td vaccine (1 - Tdap) Never done   Zoster (Shingles) Vaccine (1 of 2) Never done   COVID-19 Vaccine (7 - Pfizer risk 2024-25 season) 07/31/2023   Flu Shot  11/23/2023   Medicare Annual Wellness Visit  12/02/2024   Mammogram  07/05/2025   Pneumococcal Vaccine for age over 41  Completed   DEXA scan (bone density measurement)  Completed   Hepatitis B Vaccine  Aged Out   HPV Vaccine  Aged Out   Meningitis B Vaccine  Aged Out   Colon Cancer Screening  Discontinued    Advanced directives: (Copy Requested) Please bring a copy of your health care power of attorney and living will to the office to be added to your chart at your convenience. You can mail to [facility] [address] [place], [place] [address] or email to [email or web address] Advance Care Planning is important because it:  [x]  Makes sure you receive the medical care that is consistent with your  values, goals, and preferences  [x]  It provides guidance to your family and loved ones and reduces their decisional burden about whether or not they are making the right decisions based on your wishes.  Follow the link provided in your after visit summary or read over the paperwork we have mailed to you to help you started getting your Advance Directives in place. If you need assistance in completing these, please reach out to us  so that we can help you!  See attachments for Preventive Care and Fall Prevention Tips.

## 2023-12-03 NOTE — Progress Notes (Signed)
 Subjective:   Deanna Burke is a 73 y.o. who presents for a Medicare Wellness preventive visit.  As a reminder, Annual Wellness Visits don't include a physical exam, and some assessments may be limited, especially if this visit is performed virtually. We may recommend an in-person follow-up visit with your provider if needed.  Visit Complete: Virtual I connected with  Deanna Burke on 12/03/23 by a audio enabled telemedicine application and verified that I am speaking with the correct person using two identifiers.  Patient Location: Home  Provider Location: Office/Clinic  I discussed the limitations of evaluation and management by telemedicine. The patient expressed understanding and agreed to proceed.  Vital Signs: Because this visit was a virtual/telehealth visit, some criteria may be missing or patient reported. Any vitals not documented were not able to be obtained and vitals that have been documented are patient reported.  VideoError- Librarian, academic were attempted between this provider and patient, however failed, due to patient having technical difficulties OR patient did not have access to video capability.  We continued and completed visit with audio only.   Persons Participating in Visit: Patient.  AWV Questionnaire: No: Patient Medicare AWV questionnaire was not completed prior to this visit.  Cardiac Risk Factors include: advanced age (>59men, >86 women);hypertension     Objective:    Today's Vitals   12/03/23 0926  PainSc: 4    There is no height or weight on file to calculate BMI.     12/03/2023    9:34 AM 10/19/2023    8:06 AM 10/08/2023   11:10 AM 08/28/2023    1:01 PM 12/01/2022    9:33 AM 07/19/2019    7:31 PM 06/19/2019    2:25 PM  Advanced Directives  Does Patient Have a Medical Advance Directive? Yes No No Yes Yes Yes Yes  Type of Estate agent of [place];Living will   Healthcare Power of  [place];Living will Healthcare Power of [place];Living will Healthcare Power of [place];Living will Healthcare Power of [place];Living will  Does patient want to make changes to medical advance directive?    No - Patient declined   No - Patient declined  Copy of Healthcare Power of Attorney in Chart? No - copy requested    No - copy requested  No - copy requested  Would patient like information on creating a medical advance directive?  No - Patient declined No - Patient declined        Current Medications (verified) Outpatient Encounter Medications as of 12/03/2023  Medication Sig   acetaminophen  (TYLENOL ) 650 MG CR tablet Take 650-1,300 mg by mouth every 8 (eight) hours as needed for pain.   allopurinol  (ZYLOPRIM ) 300 MG tablet Take 1 tablet (300 mg total) by mouth daily.   Bismuth  Subgallate 200 MG CHEW Chew 600 mg by mouth in the morning, at noon, in the evening, and at bedtime.   cholecalciferol  (VITAMIN D3) 25 MCG (1000 UNIT) tablet Take 1,000 Units by mouth in the morning.   furosemide  (LASIX ) 20 MG tablet Take 1 tablet (20 mg total) by mouth daily.   losartan  (COZAAR ) 50 MG tablet TAKE 1 TABLET BY MOUTH DAILY (Patient taking differently: Take 25 mg by mouth daily.)   Melatonin 10 MG TABS Take 10 mg by mouth at bedtime.   metoprolol  succinate (TOPROL -XL) 50 MG 24 hr tablet TAKE 1 TABLET BY MOUTH DAILY   nitrofurantoin  (MACRODANTIN ) 100 MG capsule Take 100 mg by mouth daily.   potassium  chloride SA (KLOR-CON  M) 20 MEQ tablet Take 1 tablet (20 mEq total) by mouth daily.   Probiotic Product (PROBIOTIC DAILY PO) Take 1 capsule by mouth in the morning.   No facility-administered encounter medications on file as of 12/03/2023.    Allergies (verified) Demerol , Dilaudid  [hydromorphone  hcl], Reglan  [metoclopramide ], Stadol [butorphanol tartrate], Sulfamethoxazole , Cephalexin, and Penicillins   History: Past Medical History:  Diagnosis Date   Abnormal finding on cardiovascular stress  test, ischemia anterolateral 02/13/2012   follow heart cath - pt told one tiny blockage that didn't even matter   Anemia 1/2/-21   Arthritis    hands, lumbar spine, hips, right ankle   Breast cancer (HCC)    Cancer (HCC)    DCIS L breast   Cataract    Left Eye   Complication of anesthesia 1996   pt has ileostomy and had surgery in 1996 that paralyzed   Fatigue    H/O nephrostomy 08/13/12   H/O ulcerative colitis    History of bone density study 2012   History of kidney stones 2015   nephrectomy   History of recurrent UTIs 02/13/2012   Hot flashes    Hypertension    Ileostomy in place, secondary to ulcerative colitis x 20 years 02/13/2012   Kidney disease, 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 anginal equivilant 02/13/2012   Torn rotator cuff 2005   Ulcerative colitis 1969-1973   Past Surgical History:  Procedure Laterality Date   ABDOMINAL HYSTERECTOMY  1996   partial   APPENDECTOMY     BREAST BIOPSY     BREAST LUMPECTOMY  05/31/11   LEFT BREAST LUMPECTOMY, NEGATIVE MARGINS, HIGH GRADE  DUCTAL  CARCINOMA IN SITU WITH ASSOCIATED CALCIFICATIONS.  ER:+, PR+,    BREAST SURGERY  05/03/11   LEFT BREAST NEEDLE CORE BIOPSY- DCIS   CARDIAC CATHETERIZATION     CATARACT EXTRACTION Left    CHOLECYSTECTOMY N/A 06/19/2019   Procedure: LAPAROSCOPIC CHOLECYSTECTOMY;  Surgeon: Vernetta Berg, MD;  Location: [facility];  Service: General;  Laterality: N/A;   COLECTOMY  1973   EXPLORATORY LAPAROTOMY  1978/1990   with lysis of adhesions   exploratory  laps     several due to abd pain related to ulcerative colitis   EYE SURGERY  05/08/2011   left cataract removal    ILEOSTOMY  1973   IR GENERIC HISTORICAL  03/08/2016   IR VENOCAVAGRAM IVC 03/08/2016 Toribio Faes, MD [facility]   IR GENERIC HISTORICAL  03/08/2016   IR US  GUIDE VASC ACCESS RIGHT 03/08/2016 Toribio Faes, MD [facility]   JOINT REPLACEMENT     KNEE ARTHROPLASTY     LAPAROSCOPIC NEPHRECTOMY Right 08/14/2012   Procedure: RIGHT LAPAROSCOPIC RETROPERITONEAL LAPAROSCOPIC NEPHRECTOMY ;  Surgeon: Ricardo Likens, MD;  Location: [facility];  Service: Urology;  Laterality: Right;  RIGHT LAPAROSCOPIC RETROPERITONEAL LAPAROSCOPIC NEPHRECTOMY, POSSIBLE OPEN    LEFT HEART CATHETERIZATION WITH CORONARY ANGIOGRAM N/A 02/13/2012   Procedure: LEFT HEART CATHETERIZATION  WITH CORONARY ANGIOGRAM;  Surgeon: Dorn JINNY Lesches, MD;  Location: [facility] LAB;  Service: Cardiovascular;  Laterality: N/A;   REVERSE SHOULDER ARTHROPLASTY Right 10/19/2023   Procedure: ARTHROPLASTY, SHOULDER, TOTAL, REVERSE;  Surgeon: Kay Kemps, MD;  Location: [facility];  Service: Orthopedics;  Laterality: Right;   ROTATOR CUFF REPAIR  2006   Right   TOTAL KNEE ARTHROPLASTY Left 04/01/2015   Procedure: LEFT TOTAL KNEE ARTHROPLASTY;  Surgeon: Reyes Billing, MD;  Location: [facility];  Service: Orthopedics;  Laterality: Left;   TOTAL KNEE ARTHROPLASTY Right 11/04/2015   Procedure: RIGHT TOTAL KNEE ARTHROPLASTY;  Surgeon: Reyes Billing, MD;  Location: [facility];  Service: Orthopedics;  Laterality: Right;   TOTAL SHOULDER ARTHROPLASTY     VAGINOPLASTY  1975   Family History  Problem Relation Age of Onset   Atrial fibrillation Mother    Heart disease Father    Heart attack Father    Atrial fibrillation Brother    Heart attack Paternal Uncle    Breast cancer Maternal Grandmother    Stomach cancer Maternal Grandfather    Cancer Paternal Grandmother        stomach   Cancer Other        Breast   Social History    Socioeconomic History   Marital status: Divorced    Spouse name: Not on file   Number of children: Not on file   Years of education: Not on file   Highest education level: Bachelor's degree (e.g., BA, AB, BS)  Occupational History   Not on file  Tobacco Use   Smoking status: Never   Smokeless tobacco: Never  Vaping Use   Vaping status: Never Used  Substance and Sexual Activity   Alcohol use: No   Drug use: No   Sexual activity: Never    Birth control/protection: Other-see comments    Comment: MENARCHE 11, Nulliparity, MENOPAUSE 1996, HRT X 16 YEARS- STOPPED 04/21/11, BC X 2 YEARS  Other Topics Concern   Not on file  Social History Narrative   Lives alone.  Does not use any assist device.     Social Drivers of Corporate investment banker Strain: Low Risk  (12/03/2023)   Overall Financial Resource Strain (CARDIA)    Difficulty of Paying Living Expenses: Not hard at all  Food Insecurity: No Food Insecurity (12/03/2023)   Hunger Vital Sign    Worried About Running Out of Food in the Last Year: Never true    Ran Out of Food in the Last Year: Never true  Transportation Needs: No Transportation Needs (12/03/2023)   PRAPARE - Administrator, Civil Service (Medical): No    Lack of Transportation (Non-Medical): No  Physical Activity: Inactive (12/03/2023)   Exercise Vital Sign    Days of Exercise per Week: 0 days    Minutes of Exercise per Session: 0 min  Stress: No Stress Concern Present (12/03/2023)   [employer] of Occupational Health - Occupational Stress Questionnaire    Feeling of Stress: Not at all  Social Connections: Moderately Isolated (12/03/2023)   Social Connection and Isolation Panel    Frequency of Communication with Friends and Family: More than three times a week    Frequency of Social Gatherings with Friends and Family: More than three times a week    Attends Religious Services: More than 4 times per year    Active Member of [employer] or  Organizations: No    Attends Banker Meetings: Never  Marital Status: Divorced    Tobacco Counseling Counseling given: Not Answered    Clinical Intake:  Pre-visit preparation completed: Yes  Pain : 0-10 Pain Score: 4  Pain Type: Other (Comment) (post surgery) Pain Location: Shoulder Pain Orientation: Right Pain Descriptors / Indicators: Sore Pain Onset: 1 to 4 weeks ago Pain Frequency: Constant     Nutritional Risks: None Diabetes: No  No results found for: HGBA1C   How often do you need to have someone help you when you read instructions, pamphlets, or other written materials from your doctor or pharmacy?: 1 - Never  Interpreter Needed?: No  Information entered by :: NAllen LPN   Activities of Daily Living     12/03/2023    9:29 AM 10/19/2023    3:18 PM  In your present state of health, do you have any difficulty performing the following activities:  Hearing? 0   Vision? 0   Difficulty concentrating or making decisions? 0   Walking or climbing stairs? 1   Dressing or bathing? 0   Doing errands, shopping? 0 0  Preparing Food and eating ? N   Using the Toilet? N   In the past six months, have you accidently leaked urine? N   Do you have problems with loss of bowel control? N   Managing your Medications? N   Managing your Finances? N   Housekeeping or managing your Housekeeping? N     Patient Care Team: Thedora Garnette HERO, MD as PCP - General (Family Medicine)  I have updated your Care Teams any recent Medical Services you may have received from other providers in the past year.     Assessment:   This is a routine wellness examination for Deanna Burke.  Hearing/Vision screen Hearing Screening - Comments:: Denies hearing issues Vision Screening - Comments:: Regular eye exams, [facility]   Goals Addressed             This Visit's Progress    Patient Stated       12/03/2023, wants to get healed up       Depression Screen      12/03/2023    9:37 AM 07/23/2023    1:01 PM 12/11/2022    2:55 PM 12/01/2022    9:34 AM 10/09/2022    1:32 PM  PHQ 2/9 Scores  PHQ - 2 Score 0 0 0 0 0  PHQ- 9 Score 0   0 0    Fall Risk     12/03/2023    9:35 AM 07/23/2023    1:01 PM 05/14/2023    2:14 PM 12/11/2022    2:55 PM 12/01/2022    9:34 AM  Fall Risk   Falls in the past year? 1 1 1  0 0  Comment slipped on ice      Number falls in past yr: 0 0 0 0 0  Injury with Fall? 1 1 1  0 0  Comment hurt shoulders  left shoulder    Risk for fall due to : Medication side effect History of fall(s)  No Fall Risks Medication side effect  Follow up Falls prevention discussed;Falls evaluation completed Falls evaluation completed  Falls evaluation completed Falls prevention discussed;Falls evaluation completed    MEDICARE RISK AT HOME:  Medicare Risk at Home Any stairs in or around the home?: Yes If so, are there any without handrails?: Yes Home free of loose throw rugs in walkways, pet beds, electrical cords, etc?: Yes Adequate lighting in your home to reduce  risk of falls?: Yes Life alert?: No Use of a cane, walker or w/c?: No Grab bars in the bathroom?: Yes Shower chair or bench in shower?: Yes Elevated toilet seat or a handicapped toilet?: Yes  TIMED UP AND GO:  Was the test performed?  No  Cognitive Function: 6CIT completed        12/03/2023    9:38 AM 12/01/2022    9:35 AM  6CIT Screen  What Year? 0 points 0 points  What month? 0 points 0 points  What time? 0 points 0 points  Count back from 20 0 points 0 points  Months in reverse 0 points 0 points  Repeat phrase 0 points 0 points  Total Score 0 points 0 points    Immunizations Immunization History  Administered Date(s) Administered   Influenza Split 01/23/2012, 02/20/2022   Influenza, High Dose Seasonal PF 01/01/2018   Influenza-Unspecified 01/24/2016   PFIZER(Purple Top)SARS-COV-2 Vaccination 05/19/2019, 06/09/2019, 03/29/2020   PNEUMOCOCCAL CONJUGATE-20 10/09/2022    Pfizer Covid-19 Vaccine Bivalent Booster 45yrs & up 10/22/2020, 03/23/2021   Pfizer(Comirnaty)Fall Seasonal Vaccine 12 years and older 01/30/2023   RSV,unspecified 05/04/2022    Screening Tests Health Maintenance  Topic Date Due   Hepatitis C Screening  Never done   DTaP/Tdap/Td (1 - Tdap) Never done   Zoster Vaccines- Shingrix (1 of 2) Never done   COVID-19 Vaccine (7 - Pfizer risk 2024-25 season) 07/31/2023   INFLUENZA VACCINE  11/23/2023   Medicare Annual Wellness (AWV)  12/02/2024   MAMMOGRAM  07/05/2025   Pneumococcal Vaccine: 50+ Years  Completed   DEXA SCAN  Completed   Hepatitis B Vaccines  Aged Out   HPV VACCINES  Aged Out   Meningococcal B Vaccine  Aged Out   Colonoscopy  Discontinued    Health Maintenance  Health Maintenance Due  Topic Date Due   Hepatitis C Screening  Never done   DTaP/Tdap/Td (1 - Tdap) Never done   Zoster Vaccines- Shingrix (1 of 2) Never done   COVID-19 Vaccine (7 - Pfizer risk 2024-25 season) 07/31/2023   INFLUENZA VACCINE  11/23/2023   Health Maintenance Items Addressed: States had shingles vaccine and due for TDAP vaccine.  Additional Screening:  Vision Screening: Recommended annual ophthalmology exams for early detection of glaucoma and other disorders of the eye. Would you like a referral to an eye doctor? No    Dental Screening: Recommended annual dental exams for proper oral hygiene  Community Resource Referral / Chronic Care Management: CRR required this visit?  No   CCM required this visit?  No   Plan:    I have personally reviewed and noted the following in the patient's chart:   Medical and social history Use of alcohol, tobacco or illicit drugs  Current medications and supplements including opioid prescriptions. Patient is not currently taking opioid prescriptions. Functional ability and status Nutritional status Physical activity Advanced directives List of other physicians Hospitalizations, surgeries, and ER  visits in previous 12 months Vitals Screenings to include cognitive, depression, and falls Referrals and appointments  In addition, I have reviewed and discussed with patient certain preventive protocols, quality metrics, and best practice recommendations. A written personalized care plan for preventive services as well as general preventive health recommendations were provided to patient.   Ardella FORBES Dawn, LPN   1/88/7974   After Visit Summary: (MyChart) Due to this being a telephonic visit, the after visit summary with patients personalized plan was offered to patient via MyChart   Notes: Nothing  significant to report at this time.

## 2023-12-04 MED ORDER — LOSARTAN POTASSIUM 50 MG PO TABS: 25.0000 mg | ORAL_TABLET | Freq: Every day | ORAL | 3 refills | Status: AC

## 2023-12-04 NOTE — Telephone Encounter (Signed)
 Copied from CRM #[id number]. Topic: Clinical - Medication Question >> Dec 04, 2023  9:27 AM Chasity T wrote: Reason for CRM: Jory pharmacist of [employer] is calling in to confirm that the medication losartan  (COZAAR ) 50 MG tablet is required for the patient to take 1/2 a tablet daily instead of an whole. He is stating if that is true a new prescription is needing to be sent to the pharmacy on file for the correct dosage for patient.

## 2023-12-04 NOTE — Telephone Encounter (Signed)
Please review and advise. Thanks. Dm/cma  

## 2023-12-04 NOTE — Addendum Note (Signed)
 Addended by: THEDORA GARNETTE HERO on: 12/04/2023 12:59 PM   Modules accepted: Orders

## 2023-12-31 ENCOUNTER — Encounter: Payer: Self-pay | Admitting: Family Medicine

## 2023-12-31 NOTE — Progress Notes (Signed)
 Sent message, via epic in basket, requesting orders in epic from Careers adviser.

## 2024-01-03 NOTE — H&P (Signed)
 Patient's anticipated LOS is less than 2 midnights, meeting these requirements: - Younger than 31 - Lives within 1 hour of care - Has a competent adult at home to recover with post-op recover - NO history of  - Chronic pain requiring opiods  - Diabetes  - Coronary Artery Disease  - Heart failure  - Heart attack  - Stroke  - DVT/VTE  - Cardiac arrhythmia  - Respiratory Failure/COPD  - Renal failure  - Anemia  - Advanced Liver disease     Deanna Burke is an 73 y.o. female.    Chief Complaint: left shoulder pain  HPI: Pt is a 73 y.o. female complaining of left shoulder pain for multiple years. Pain had continually increased since the beginning. X-rays in the clinic show end-stage arthritic changes of the left shoulder. Pt has tried various conservative treatments which have failed to alleviate their symptoms, including injections and therapy. Various options are discussed with the patient. Risks, benefits and expectations were discussed with the patient. Patient understand the risks, benefits and expectations and wishes to proceed with surgery.   PCP:  Thedora Garnette HERO, MD  D/C Plans: Home  PMH: Past Medical History:  Diagnosis Date   Abnormal finding on cardiovascular stress test, ischemia anterolateral 02/13/2012   follow heart cath - pt told one tiny blockage that didn't even matter   Anemia 1/2/-21   Arthritis    hands, lumbar spine, hips, right ankle   Breast cancer (HCC)    Cancer (HCC)    DCIS L breast   Cataract    Left Eye   Complication of anesthesia 1996   pt has ileostomy and had surgery in 1996 that paralyzed   Fatigue    H/O nephrostomy 08/13/12   H/O ulcerative colitis    History of bone density study 2012   History of kidney stones 2015   nephrectomy   History of recurrent UTIs 02/13/2012   Hot flashes    Hypertension    Ileostomy in place, secondary to ulcerative colitis x 20 years 02/13/2012   Kidney disease, 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  ER:+, PR+,    BREAST SURGERY  05/03/11   LEFT BREAST NEEDLE CORE BIOPSY- DCIS   CARDIAC CATHETERIZATION     CATARACT EXTRACTION Left    CHOLECYSTECTOMY N/A 06/19/2019   Procedure: LAPAROSCOPIC CHOLECYSTECTOMY;  Surgeon: Vernetta Berg, MD;  Location: [facility];  Service: General;  Laterality: N/A;   COLECTOMY  1973   EXPLORATORY LAPAROTOMY  1978/1990   with lysis of adhesions  exploratory laps     several due to abd pain related to ulcerative colitis   EYE SURGERY  05/08/2011   left cataract removal    ILEOSTOMY  1973   IR GENERIC HISTORICAL  03/08/2016   IR VENOCAVAGRAM IVC 03/08/2016 Toribio Faes, MD [facility]   IR GENERIC HISTORICAL  03/08/2016   IR US  GUIDE VASC ACCESS RIGHT 03/08/2016 Toribio Faes, MD [facility]   JOINT REPLACEMENT     KNEE ARTHROPLASTY     LAPAROSCOPIC NEPHRECTOMY Right 08/14/2012   Procedure: RIGHT LAPAROSCOPIC RETROPERITONEAL LAPAROSCOPIC NEPHRECTOMY ;  Surgeon: Ricardo Likens, MD;  Location: [facility];  Service: Urology;  Laterality: Right;  RIGHT LAPAROSCOPIC RETROPERITONEAL LAPAROSCOPIC NEPHRECTOMY, POSSIBLE OPEN    LEFT HEART  CATHETERIZATION WITH CORONARY ANGIOGRAM N/A 02/13/2012   Procedure: LEFT HEART CATHETERIZATION WITH CORONARY ANGIOGRAM;  Surgeon: Dorn JINNY Lesches, MD;  Location: [facility];  Service: Cardiovascular;  Laterality: N/A;   REVERSE SHOULDER ARTHROPLASTY Right 10/19/2023   Procedure: ARTHROPLASTY, SHOULDER, TOTAL, REVERSE;  Surgeon: Kay Kemps, MD;  Location: [facility];  Service: Orthopedics;  Laterality: Right;   ROTATOR CUFF REPAIR  2006   Right   TOTAL KNEE ARTHROPLASTY Left 04/01/2015   Procedure: LEFT TOTAL KNEE ARTHROPLASTY;  Surgeon: Reyes Billing, MD;  Location: [facility];  Service: Orthopedics;  Laterality: Left;   TOTAL KNEE ARTHROPLASTY Right 11/04/2015   Procedure: RIGHT TOTAL KNEE ARTHROPLASTY;  Surgeon: Reyes Billing, MD;  Location: [facility];  Service: Orthopedics;  Laterality: Right;   TOTAL SHOULDER ARTHROPLASTY     VAGINOPLASTY  1975    Social History:  reports that she has never smoked. She has never used smokeless tobacco. She reports that she does not drink alcohol and does not use drugs. BMI: Estimated body mass index is 31.35 kg/m as calculated from the following:   Height as of 11/07/23: 5' 3 (1.6 m).   Weight as of 11/07/23: 80.3 kg.  Lab Results  Component Value Date   ALBUMIN 4.5 07/23/2023   Diabetes: Patient does not have a diagnosis of diabetes.     Smoking Status:   reports that she has never smoked. She has never used smokeless tobacco.    Allergies:  Allergies  Allergen Reactions   Demerol  Nausea And Vomiting   Dilaudid  [Hydromorphone  Hcl] Nausea And Vomiting and Other (See Comments)    Muscle cramping   Reglan  [Metoclopramide ] Other (See Comments)    Cramps in hands   Stadol [Butorphanol Tartrate] Nausea And Vomiting    Injection   Sulfamethoxazole  Nausea And Vomiting   Cephalexin Rash   Penicillins Rash    Rash only; note also rash to Keflex Has patient had a PCN reaction causing immediate rash, facial/tongue/throat swelling, SOB or lightheadedness  with hypotension: rash Has patient had a PCN reaction causing severe rash involving mucus membranes or skin necrosis: no Has patient had a PCN reaction that required hospitalization no Has patient had a PCN reaction occurring within the last 10 years: no If all of the above answers are NO, then may proceed with Cephalosporin use.    Medications: No current facility-administered medications for this encounter.   Current Outpatient Medications  Medication Sig Dispense Refill   acetaminophen  (TYLENOL ) 650 MG CR tablet Take 650-1,300 mg by mouth every 8 (eight) hours as needed for pain.     allopurinol  (ZYLOPRIM ) 300 MG tablet Take 1 tablet (300 mg total) by mouth daily. 90 tablet 3   Bismuth  Subgallate 200 MG CHEW Chew 600 mg by mouth  in the morning, at noon, in the evening, and at bedtime.     cholecalciferol  (VITAMIN D3) 25 MCG (1000 UNIT) tablet Take 1,000 Units by mouth in the morning.     furosemide  (LASIX ) 20 MG tablet Take 1 tablet (20 mg total) by mouth daily. 90 tablet 0   losartan  (COZAAR ) 50 MG tablet Take 0.5 tablets (25 mg total) by mouth daily. 45 tablet 3   Melatonin 10 MG TABS Take 10 mg by mouth at bedtime.     metoprolol  succinate (TOPROL -XL) 50 MG 24 hr tablet TAKE 1 TABLET BY MOUTH DAILY 90 tablet 1   nitrofurantoin  (MACRODANTIN ) 100 MG capsule Take 100 mg by mouth daily.     potassium chloride  SA (KLOR-CON  M) 20 MEQ tablet Take 1 tablet (20 mEq total) by mouth daily. 30 tablet 2   Probiotic Product (PROBIOTIC DAILY PO) Take 1 capsule by mouth in the morning.      No results found for this or any previous visit (from the past 48 hours). No results found.  ROS: Pain with rom of the left upper extremity  Physical Exam: Alert and oriented 73 y.o. female in no acute distress Cranial nerves 2-12 intact Cervical spine: full rom with no tenderness, nv intact distally Chest: active breath sounds bilaterally, no wheeze rhonchi or rales Heart: regular rate and rhythm, no  murmur Abd: non tender non distended with active bowel sounds Hip is stable with rom  Left shoulder painful and weak rom Nv intact distally No rashes or edema distally  Assessment/Plan Assessment: left shoulder cuff arthropathy   Plan:  Patient will undergo a left reverse total shoulder by Dr. Kay at [facility] Risks benefits and expectations were discussed with the patient. Patient understand risks, benefits and expectations and wishes to proceed. Preoperative templating of the joint replacement has been completed, documented, and submitted to the Operating Room personnel in order to optimize intra-operative equipment management.   Arvella Fireman PA-C, MPAS [facility] is now [employer] [address], [address], [place], [place] [address] Phone: [phone] [email or web address] Facebook  [employer]

## 2024-01-05 NOTE — Patient Instructions (Addendum)
 SURGICAL WAITING ROOM VISITATION Patients having surgery or a procedure may have no more than 2 support people in the waiting area - these visitors may rotate in the visitor waiting room.   If the patient needs to stay at the hospital during part of their recovery, the visitor guidelines for inpatient rooms apply.  PRE-OP VISITATION  Pre-op nurse will coordinate an appropriate time for 1 support person to accompany the patient in pre-op.  This support person may not rotate.  This visitor will be contacted when the time is appropriate for the visitor to come back in the pre-op area.  Please refer to the [facility] website for the visitor guidelines for Inpatients (after your surgery is over and you are in a regular room).  You are not required to quarantine at this time prior to your surgery. However, you must do this: Hand Hygiene often Do NOT share personal items Notify your provider if you are in close contact with someone who has COVID or you develop fever 100.4 or greater, new onset of sneezing, cough, sore throat, shortness of breath or body aches.  If you test positive for Covid or have been in contact with anyone that has tested positive in the last 10 days please notify you surgeon.    Your procedure is scheduled on:  FRIDAY  January 18, 2024  Report to [facility] Main Entrance: [place] entrance where the [employer] is available.   Report to admitting at:   05:15  AM  Call this number if you have any questions or problems the morning of surgery [phone]  Do not eat food after Midnight the night prior to your surgery/procedure.  After Midnight you may have the following liquids until  04:30 AM DAY OF SURGERY  Clear Liquid Diet Water  Black Coffee (sugar ok, NO MILK/CREAM OR CREAMERS)  Tea (sugar ok, NO MILK/CREAM OR CREAMERS) regular and decaf                             Plain Jell-O  with no fruit (NO RED)                                           Fruit  ices (not with fruit pulp, NO RED)                                     Popsicles (NO RED)                                                                  Juice: NO CITRUS JUICES: only apple, WHITE grape, WHITE cranberry Sports drinks like Gatorade or Powerade (NO RED)                   The day of surgery:  Drink ONE (1) Pre-Surgery Clear Ensure  at  04:30 AM the morning of surgery. Drink in one sitting. Do not sip.  This drink was given to you during your hospital pre-op appointment visit. Nothing else to drink after  completing the Pre-Surgery Clear Ensure : No candy, chewing gum or throat lozenges.    FOLLOW ANY ADDITIONAL PRE OP INSTRUCTIONS YOU RECEIVED FROM YOUR SURGEON'S OFFICE!!!   Oral Hygiene is also important to reduce your risk of infection.        Remember - BRUSH YOUR TEETH THE MORNING OF SURGERY WITH YOUR REGULAR TOOTHPASTE  Do NOT smoke after Midnight the night before surgery.  STOP TAKING all Vitamins, Herbs and supplements 1 week before your surgery.   Take ONLY these medicines the morning of surgery with A SIP OF WATER : Metoprolol , allopurinol , Nitrofurantoin  (Macrodantin ) and Tylenol  if needed for pain.   DO NOT TAKE FUROSEMIDE  OR LOSARTAN  the morning of your surgery.                   You may not have any metal on your body including hair pins, jewelry, and body piercing  Do not wear make-up, lotions, powders, perfumes or deodorant  Do not wear nail polish including gel and S&S, artificial / acrylic nails, or any other type of covering on natural nails including finger and toenails. If you have artificial nails, gel coating, etc., that needs to be removed by a nail salon, Please have this removed prior to surgery. Not doing so may mean that your surgery could be cancelled or delayed if the Surgeon or anesthesia staff feels like they are unable to monitor you safely.   Do not shave 48 hours prior to surgery to avoid nicks in your skin which may contribute to  postoperative infections.   Contacts, Hearing Aids, dentures or bridgework may not be worn into surgery. DENTURES WILL BE REMOVED PRIOR TO SURGERY PLEASE DO NOT APPLY Poly grip OR ADHESIVES!!!  You may bring a small overnight bag with you on the day of surgery, only pack items that are not valuable. [facility] IS NOT RESPONSIBLE   FOR VALUABLES THAT ARE LOST OR STOLEN.   Do not bring your home medications to the hospital. The Pharmacy will dispense medications listed on your medication list to you during your admission in the Hospital.  Please read over the following fact sheets you were given: IF YOU HAVE QUESTIONS ABOUT YOUR PRE-OP INSTRUCTIONS, PLEASE CALL [phone].     Pre-operative 5 CHG Bath Instructions   You can play a key role in reducing the risk of infection after surgery. Your skin needs to be as free of germs as possible. You can reduce the number of germs on your skin by washing with CHG (chlorhexidine  gluconate) soap before surgery. CHG is an antiseptic soap that kills germs and continues to kill germs even after washing.   DO NOT use if you have an allergy to chlorhexidine /CHG or antibacterial soaps. If your skin becomes reddened or irritated, stop using the CHG and notify one of our RNs at [phone]  Please shower with the CHG soap starting 4 days before surgery using the following schedule: START SHOWERS ON [facility] January 14, 2024  Please keep in mind the following:  DO NOT shave, including legs and underarms, starting the day of your first shower.   You may shave your face at any point before/day of surgery.   Place clean sheets on your bed the day you start using CHG soap. Use a clean washcloth (not used since being washed) for each shower. DO NOT sleep with pets once you start using the  CHG.   CHG Shower Instructions:  If you choose to wash your hair and private area, wash first with your normal shampoo/soap.  After you use shampoo/soap, rinse your hair and body thoroughly to remove shampoo/soap residue.  Turn the water  OFF and apply about 3 tablespoons (45 ml) of CHG soap to a CLEAN washcloth.  Apply CHG soap ONLY FROM YOUR NECK DOWN TO YOUR TOES (washing for 3-5 minutes)  DO NOT use CHG soap on face, private areas, open wounds, or sores.  Pay special attention to the area where your surgery is being performed.  If you are having back surgery, having someone wash your back for you may be helpful.  Wait 2 minutes after CHG soap is applied, then you may rinse off the CHG soap.  Pat dry with a clean towel  Put on clean clothes/pajamas   If you choose to wear lotion, please use ONLY the CHG-compatible lotions on the back of this paper.     Additional instructions for the day of surgery: DO NOT APPLY any lotions, deodorants, cologne, or perfumes.   Put on clean/comfortable clothes.  Brush your teeth.  Ask your nurse before applying any prescription medications to the skin.      CHG Compatible Lotions   Aveeno Moisturizing lotion  Cetaphil Moisturizing Cream  Cetaphil Moisturizing Lotion  Clairol Herbal Essence Moisturizing Lotion, Dry Skin  Clairol Herbal Essence Moisturizing Lotion, Extra Dry Skin  Clairol Herbal Essence Moisturizing Lotion, Normal Skin  Curel Age Defying Therapeutic Moisturizing Lotion with Alpha Hydroxy  Curel Extreme Care Body Lotion  Curel Soothing Hands Moisturizing Hand Lotion  Curel Therapeutic Moisturizing Cream, Fragrance-Free  Curel Therapeutic Moisturizing Lotion, Fragrance-Free  Curel Therapeutic Moisturizing Lotion, Original Formula  Eucerin Daily Replenishing Lotion  Eucerin Dry Skin Therapy Plus Alpha Hydroxy Crme  Eucerin Dry Skin Therapy Plus Alpha Hydroxy Lotion  Eucerin Original Crme  Eucerin Original Lotion  Eucerin  Plus Crme Eucerin Plus Lotion  Eucerin TriLipid Replenishing Lotion  Keri Anti-Bacterial Hand Lotion  Keri Deep Conditioning Original Lotion Dry Skin Formula Softly Scented  Keri Deep Conditioning Original Lotion, Fragrance Free Sensitive Skin Formula  Keri Lotion Fast Absorbing Fragrance Free Sensitive Skin Formula  Keri Lotion Fast Absorbing Softly Scented Dry Skin Formula  Keri Original Lotion  Keri Skin Renewal Lotion Keri Silky Smooth Lotion  Keri Silky Smooth Sensitive Skin Lotion  Nivea Body Creamy Conditioning Oil  Nivea Body Extra Enriched Lotion  Nivea Body Original Lotion  Nivea Body Sheer Moisturizing Lotion Nivea Crme  Nivea Skin Firming Lotion  NutraDerm 30 Skin Lotion  NutraDerm Skin Lotion  NutraDerm Therapeutic Skin Cream  NutraDerm Therapeutic Skin Lotion  ProShield Protective Hand Cream  Provon moisturizing lotion       Preparing for Total Shoulder Arthroplasty ================================================================= Please follow these instructions carefully, in addition to any other special Bathing information that was explained to you at the Presurgical Appointment:  BENZOYL PEROXIDE 5% GEL: Used to kill bacteria on the skin which could cause an infection at the surgery site.   Please do not use if you  have an allergy to benzoyl peroxide. If your skin becomes reddened/irritated stop using the benzoyl peroxide and inform your Doctor.   Starting two days before surgery, apply as follows:  1. Apply benzoyl peroxide gel in the morning and at night. Apply after taking a shower. If you are not taking a shower, clean entire shoulder front, back, and side, along with the armpit with a clean wet washcloth.  2. Place a quarter-sized dollop of the gel on your SHOULDER and rub in thoroughly, making sure to cover the front, back, and side of your shoulder, along with the armpit.   2 Days prior to Surgery  [facility]  January 16, 2024 First Application  _______ Morning Second Application _______ Night  Day Before Surgery  THURSDAY  January 17, 2024 First Application______ Morning  On the night before surgery, wash your entire body (except hair, face and private areas) with CHG Soap. THEN, rub in the LAST application of the Benzoyl Peroxide Gel on your shoulder.   3. On the Morning of Surgery wash your BODY AGAIN with CHG Soap (except hair, face and private areas)  4. DO NOT USE THE BENZOYL PEROXIDE GEL ON THE DAY OF YOUR SURGERY      FAILURE TO FOLLOW THESE INSTRUCTIONS MAY RESULT IN THE CANCELLATION OF YOUR SURGERY  PATIENT SIGNATURE_________________________________  NURSE SIGNATURE__________________________________  ________________________________________________________________________         Deanna Burke    An incentive spirometer is a tool that can help keep your lungs clear and active. This tool measures how well you are filling your lungs with each breath. Taking long deep breaths may help reverse or decrease the chance of developing breathing (pulmonary) problems (especially infection) following: A long period of time when you are unable to move or be active. BEFORE THE PROCEDURE  If the spirometer includes an indicator to show your best effort, your nurse or respiratory therapist will set it to a desired goal. If possible, sit up straight or lean slightly forward. Try not to slouch. Hold the incentive spirometer in an upright position. INSTRUCTIONS FOR USE  Sit on the edge of your bed if possible, or sit up as far as you can in bed or on a chair. Hold the incentive spirometer in an upright position. Breathe out normally. Place the mouthpiece in your mouth and seal your lips tightly around it. Breathe in slowly and as deeply as possible, raising the piston or the ball toward the top of the column. Hold your breath for 3-5 seconds or for as long as possible. Allow the piston or ball to fall to the  bottom of the column. Remove the mouthpiece from your mouth and breathe out normally. Rest for a few seconds and repeat Steps 1 through 7 at least 10 times every 1-2 hours when you are awake. Take your time and take a few normal breaths between deep breaths. The spirometer may include an indicator to show your best effort. Use the indicator as a goal to work toward during each repetition. After each set of 10 deep breaths, practice coughing to be sure your lungs are clear. If you have an incision (the cut made at the time of surgery), support your incision when coughing by placing a pillow or rolled up towels firmly against it. Once you are able to get out of bed, walk around indoors and cough well. You may stop using the incentive spirometer when instructed by your caregiver.  RISKS AND COMPLICATIONS Take your time so you do  not get dizzy or light-headed. If you are in pain, you may need to take or ask for pain medication before doing incentive spirometry. It is harder to take a deep breath if you are having pain. AFTER USE Rest and breathe slowly and easily. It can be helpful to keep track of a log of your progress. Your caregiver can provide you with a simple table to help with this. If you are using the spirometer at home, follow these instructions: SEEK MEDICAL CARE IF:  You are having difficultly using the spirometer. You have trouble using the spirometer as often as instructed. Your pain medication is not giving enough relief while using the spirometer. You develop fever of 100.5 F (38.1 C) or higher.                                                                                                    SEEK IMMEDIATE MEDICAL CARE IF:  You cough up bloody sputum that had not been present before. You develop fever of 102 F (38.9 C) or greater. You develop worsening pain at or near the incision site. MAKE SURE YOU:  Understand these instructions. Will watch your condition. Will get help  right away if you are not doing well or get worse. Document Released: 08/21/2006 Document Revised: 07/03/2011 Document Reviewed: 10/22/2006 [facility] Patient Information 2014 [place], [place].          If you would like to see a video about joint replacement:   [email or web address]

## 2024-01-05 NOTE — Progress Notes (Signed)
 COVID Vaccine received:  []  No [x]  Yes Date of any COVID positive Test in last 90 days: none  PCP - Garnette Simpler, MD clearance scanned in Media 12-10-23 Cardiologist - none  Chest x-ray -  EKG - 07-23-2023  Epic  Stress Test - 01-16-2012  False positive Myoview  ECHO -  Cardiac Cath - 02-13-2012  LHC Epic  normal coronaries CT Coronary Calcium  score:   Pacemaker / ICD device [x]  No []  Yes   Spinal Cord Stimulator:[x]  No []  Yes       History of Sleep Apnea? []  No [x]  Yes   CPAP used?- [x]  No []  Yes    Patient has: [x]  NO Hx DM   []  Pre-DM   []  DM1  []   DM2 Does the patient monitor blood sugar?   [x]  N/A   []  No []  Yes   Blood Thinner / Instructions:  none Aspirin  Instructions: none  ERAS Protocol Ordered: []  No  [x]  Yes PRE-SURGERY [x]  ENSURE  []  G2    Patient is to be NPO after: 0430  Dental hx: []  Dentures:  [x]  N/A      []  Bridge or Partial:                   []  Loose or Damaged teeth:   Comments: The patient was given Benzoyl peroxide Gel as ordered. Instruction regarding application starting 2 days prior to surgery was given and patient voiced understanding.   Patient was given the 5 CHG shower / bath instructions for  Reverse Shoulder arthroplasty surgery along with 2 bottles of the CHG soap. Patient will start this on:   Monday 01-14-24           Activity level: Able to walk up 2 flights of stairs without becoming significantly short of breath or having chest pain?  []  No   [x]    Yes  Patient can perform ADLs without assistance. 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   Patient denies any S&S of respiratory illness or Covid - no shortness of breath, fever, cough or chest pain at PAT appointment.  Patient verbalized understanding and agreement to the Pre-Surgical Instructions that were given to them at this PAT appointment. Patient was also educated of the need to review these PAT  instructions again prior to her surgery.I reviewed the appropriate phone numbers to call if they have any and questions or concerns.

## 2024-01-07 ENCOUNTER — Encounter (HOSPITAL_COMMUNITY)
Admission: RE | Admit: 2024-01-07 | Discharge: 2024-01-07 | Disposition: A | Discharge status: Home / Self Care | Source: Ambulatory Visit | Attending: Orthopedic Surgery

## 2024-01-07 ENCOUNTER — Encounter (HOSPITAL_COMMUNITY): Payer: Self-pay

## 2024-01-07 ENCOUNTER — Other Ambulatory Visit: Payer: Self-pay

## 2024-01-07 VITALS — BP 113/76 | HR 102 | Temp 98.2°F | Resp 16 | Ht 63.0 in | Wt 175.0 lb

## 2024-01-07 DIAGNOSIS — I10 Essential (primary) hypertension: Secondary | ICD-10-CM

## 2024-01-07 DIAGNOSIS — Z01818 Encounter for other preprocedural examination: Secondary | ICD-10-CM | POA: Diagnosis present

## 2024-01-07 DIAGNOSIS — Z932 Ileostomy status: Secondary | ICD-10-CM | POA: Insufficient documentation

## 2024-01-07 DIAGNOSIS — S4992XS Unspecified injury of left shoulder and upper arm, sequela: Secondary | ICD-10-CM

## 2024-01-07 DIAGNOSIS — I129 Hypertensive chronic kidney disease with stage 1 through stage 4 chronic kidney disease, or unspecified chronic kidney disease: Secondary | ICD-10-CM | POA: Insufficient documentation

## 2024-01-07 DIAGNOSIS — Z853 Personal history of malignant neoplasm of breast: Secondary | ICD-10-CM | POA: Diagnosis not present

## 2024-01-07 DIAGNOSIS — M19012 Primary osteoarthritis, left shoulder: Secondary | ICD-10-CM | POA: Insufficient documentation

## 2024-01-07 DIAGNOSIS — N289 Disorder of kidney and ureter, unspecified: Secondary | ICD-10-CM

## 2024-01-07 LAB — CBC
HCT: 45.2 % (ref 36.0–46.0)
Hemoglobin: 14.1 g/dL (ref 12.0–15.0)
MCH: 29.4 pg (ref 26.0–34.0)
MCHC: 31.2 g/dL (ref 30.0–36.0)
MCV: 94.4 fL (ref 80.0–100.0)
Platelets: 289 K/uL (ref 150–400)
RBC: 4.79 MIL/uL (ref 3.87–5.11)
RDW: 13.7 % (ref 11.5–15.5)
WBC: 6.2 K/uL (ref 4.0–10.5)
nRBC: 0 % (ref 0.0–0.2)

## 2024-01-07 LAB — BASIC METABOLIC PANEL WITH GFR
Anion gap: 14 (ref 5–15)
BUN: 30 mg/dL — ABNORMAL HIGH (ref 8–23)
CO2: 20 mmol/L — ABNORMAL LOW (ref 22–32)
Calcium: 11.3 mg/dL — ABNORMAL HIGH (ref 8.9–10.3)
Chloride: 104 mmol/L (ref 98–111)
Creatinine, Ser: 1.07 mg/dL — ABNORMAL HIGH (ref 0.44–1.00)
GFR, Estimated: 54 mL/min — ABNORMAL LOW (ref >60)
Glucose, Bld: 102 mg/dL — ABNORMAL HIGH (ref 70–99)
Potassium: 4.3 mmol/L (ref 3.5–5.1)
Sodium: 138 mmol/L (ref 135–145)

## 2024-01-07 LAB — SURGICAL PCR SCREEN
MRSA, PCR: NEGATIVE
Staphylococcus aureus: NEGATIVE

## 2024-01-08 NOTE — Anesthesia Preprocedure Evaluation (Addendum)
 Anesthesia Evaluation  Patient identified by MRN, date of birth, ID band Patient awake    Reviewed: Allergy & Precautions, H&P , NPO status , Patient's Chart, lab work & pertinent test results  Airway Mallampati: II   Neck ROM: full    Dental   Pulmonary sleep apnea    breath sounds clear to auscultation       Cardiovascular hypertension,  Rhythm:regular Rate:Normal     Neuro/Psych    GI/Hepatic PUD,,,Ileostomy    Endo/Other    Renal/GU S/p nephrectomy     Musculoskeletal  (+) Arthritis ,    Abdominal   Peds  Hematology   Anesthesia Other Findings   Reproductive/Obstetrics H/o breast CA                              Anesthesia Physical Anesthesia Plan  ASA: 3  Anesthesia Plan: General   Post-op Pain Management: Regional block*   Induction: Intravenous  PONV Risk Score and Plan: 3 and Ondansetron , Dexamethasone , Midazolam  and Treatment may vary due to age or medical condition  Airway Management Planned: Oral ETT  Additional Equipment:   Intra-op Plan:   Post-operative Plan: Extubation in OR  Informed Consent: I have reviewed the patients History and Physical, chart, labs and discussed the procedure including the risks, benefits and alternatives for the proposed anesthesia with the patient or authorized representative who has indicated his/her understanding and acceptance.     Dental advisory given  Plan Discussed with: CRNA, Anesthesiologist and Surgeon  Anesthesia Plan Comments: (See PAT note from 9/15)         Anesthesia Quick Evaluation

## 2024-01-08 NOTE — Progress Notes (Signed)
 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 PMH of HTN, hx of PE (remotely in 1970s), OSA (no CPAP use), UC s/p ileostomy, CKD3, breast cancer s/p lumpectomy and XRT.  Patient had R shoulder surgery with Dr. Kay on 10/19/23. No complications noted.   Seen by PCP on 11/07/23. All issues stable at that visit. Medical and cardiac clearance signed that patient is low risk (scanned in media on 12/10/23)   Patient was seen by Cardiology in the past for chest pain. She had an abnormal stress test and subsequently underwent a cath on 02/13/2012 which showed normal coronaries. Has not needed ongoing cardiology f/u.   VS: BP 113/76 Comment: right arm sitting  Pulse (!) 102 Comment: normal for patient she states  Temp 36.8 C (Oral)   Resp 16   Ht 5' 3 (1.6 m)   Wt 79.4 kg   SpO2 96%   BMI 31.00 kg/m   PROVIDERS: Thedora Garnette HERO, MD   LABS: Labs reviewed: Acceptable for surgery. (all labs ordered are listed, but only abnormal results are displayed)  Labs Reviewed  BASIC METABOLIC PANEL WITH GFR - Abnormal; Notable for the following components:      Result Value   CO2 20 (*)    Glucose, Bld 102 (*)    BUN 30 (*)    Creatinine, Ser 1.07 (*)    Calcium  11.3 (*)    GFR, Estimated 54 (*)    All other components within normal limits  SURGICAL PCR SCREEN  CBC     EKG 07/23/23:   NSR, rate 95   CV:   LHC 02/13/2012:   ANGIOGRAPHIC RESULTS:    1. Left main; normal  2. LAD; fluoroscopically calcified in its proximal segment without any obstructive disease. 3. Left circumflex;  dominant with a 50% ostial OM1 stenosis.  4. Right coronary artery; nondominant and free of significant disease 5. Left ventriculography; RAO left ventriculogram was performed using  25 mL of Visipaque dye at 12 mL/second. The overall LVEF estimated  60 %  With/Without wall motion abnormalities   IMPRESSION:Deanna Burke has essentially normal coronary arteries and normal left ventricular function. I do not think her OM1 ostial stenosis is physiologically significant. I think that her Myoview was false positive and her symptoms noncardiac.  Past Medical History:  Diagnosis Date   Abnormal finding on cardiovascular stress test, ischemia anterolateral 02/13/2012   follow heart cath - pt told one tiny blockage that didn't even matter   Anemia 1/2/-21   Arthritis    hands, lumbar spine, hips, right ankle   Breast cancer (HCC)    Cancer (HCC)    DCIS L breast   Cataract    Left Eye   Complication of anesthesia 1996   pt has ileostomy and had surgery in 1996 that paralyzed   Fatigue    H/O nephrostomy 08/13/2012   H/O ulcerative colitis    History of bone density study 2012   History of kidney stones 2015   nephrectomy   History of recurrent UTIs 02/13/2012  Hot flashes    Hypertension    Ileostomy in place, secondary to ulcerative colitis x 20 years 02/13/2012   Kidney disease, 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  ER:+, PR+,    BREAST SURGERY  05/03/11   LEFT BREAST NEEDLE CORE BIOPSY- DCIS   CARDIAC CATHETERIZATION     CATARACT EXTRACTION Left    CHOLECYSTECTOMY N/A 06/19/2019   Procedure: LAPAROSCOPIC CHOLECYSTECTOMY;  Surgeon: Vernetta Berg, MD;  Location: [facility];  Service: General;  Laterality: N/A;   COLECTOMY  1973   EXPLORATORY LAPAROTOMY  1978/1990   with lysis of adhesions   exploratory laps     several due to abd pain related to ulcerative colitis   EYE SURGERY  05/08/2011   left cataract removal    ILEOSTOMY  1973   IR GENERIC HISTORICAL  03/08/2016   IR VENOCAVAGRAM IVC 03/08/2016 Toribio Faes, MD [facility]   IR GENERIC HISTORICAL  03/08/2016   IR US  GUIDE VASC ACCESS RIGHT 03/08/2016 Toribio Faes, MD [facility]   JOINT REPLACEMENT     KNEE ARTHROPLASTY     LAPAROSCOPIC NEPHRECTOMY Right 08/14/2012   Procedure: RIGHT LAPAROSCOPIC RETROPERITONEAL LAPAROSCOPIC NEPHRECTOMY ;  Surgeon: Ricardo Likens, MD;  Location: [facility];  Service: Urology;  Laterality: Right;  RIGHT LAPAROSCOPIC RETROPERITONEAL LAPAROSCOPIC NEPHRECTOMY, POSSIBLE OPEN    LEFT HEART CATHETERIZATION WITH CORONARY ANGIOGRAM N/A 02/13/2012   Procedure: LEFT HEART CATHETERIZATION WITH CORONARY ANGIOGRAM;  Surgeon: Dorn JINNY Lesches, MD;  Location: [facility] CATH [facility];  Service: Cardiovascular;  Laterality: N/A;   REVERSE SHOULDER ARTHROPLASTY Right 10/19/2023   Procedure: ARTHROPLASTY, SHOULDER, TOTAL, REVERSE;  Surgeon: Kay Kemps, MD;  Location: [facility];  Service: Orthopedics;  Laterality: Right;   ROTATOR CUFF REPAIR  2006   Right   TOTAL KNEE ARTHROPLASTY Left 04/01/2015   Procedure: LEFT TOTAL KNEE ARTHROPLASTY;  Surgeon: Reyes Billing, MD;  Location:  [facility];  Service: Orthopedics;  Laterality: Left;   TOTAL KNEE ARTHROPLASTY Right 11/04/2015   Procedure: RIGHT TOTAL KNEE ARTHROPLASTY;  Surgeon: Reyes Billing, MD;  Location: [facility];  Service: Orthopedics;  Laterality: Right;   TOTAL SHOULDER ARTHROPLASTY     VAGINOPLASTY  1975    MEDICATIONS:  acetaminophen  (TYLENOL ) 650 MG CR tablet   allopurinol  (ZYLOPRIM ) 300 MG tablet   Bismuth  Subgallate 200 MG CHEW   cholecalciferol  (VITAMIN D3) 25 MCG (1000 UNIT) tablet   diphenhydrAMINE  (SOMINEX) 25 MG tablet   furosemide  (LASIX ) 20 MG tablet   losartan  (COZAAR ) 50 MG tablet   Melatonin  10 MG TABS   metoprolol  succinate (TOPROL -XL) 50 MG 24 hr tablet   nitrofurantoin  (MACRODANTIN ) 100 MG capsule   potassium chloride  SA (KLOR-CON  M) 20 MEQ tablet   Probiotic Product (PROBIOTIC DAILY PO)   No current facility-administered medications for this encounter.   Burnard CHRISTELLA Odis DEVONNA [facility] Short Stay/Anesthesiology [facility] Phone [phone] 01/08/2024 10:22 AM

## 2024-01-13 ENCOUNTER — Other Ambulatory Visit: Payer: Self-pay | Admitting: Family Medicine

## 2024-01-13 DIAGNOSIS — E876 Hypokalemia: Secondary | ICD-10-CM

## 2024-01-18 ENCOUNTER — Encounter (HOSPITAL_COMMUNITY): Payer: Self-pay | Admitting: Orthopedic Surgery

## 2024-01-18 ENCOUNTER — Other Ambulatory Visit: Payer: Self-pay | Admitting: Family Medicine

## 2024-01-18 ENCOUNTER — Encounter (HOSPITAL_COMMUNITY)
Admission: RE | Disposition: A | Discharge status: Home / Self Care | Payer: Self-pay | Source: Home / Self Care | Attending: Orthopedic Surgery

## 2024-01-18 ENCOUNTER — Other Ambulatory Visit: Payer: Self-pay

## 2024-01-18 ENCOUNTER — Ambulatory Visit (HOSPITAL_COMMUNITY): Payer: Self-pay | Admitting: Medical

## 2024-01-18 ENCOUNTER — Observation Stay (HOSPITAL_COMMUNITY)

## 2024-01-18 ENCOUNTER — Ambulatory Visit (HOSPITAL_BASED_OUTPATIENT_CLINIC_OR_DEPARTMENT_OTHER): Admitting: Anesthesiology

## 2024-01-18 ENCOUNTER — Observation Stay (HOSPITAL_COMMUNITY)
Admission: RE | Admit: 2024-01-18 | Discharge: 2024-01-19 | Disposition: A | Discharge status: Home / Self Care | Attending: Orthopedic Surgery | Admitting: Orthopedic Surgery

## 2024-01-18 DIAGNOSIS — M75102 Unspecified rotator cuff tear or rupture of left shoulder, not specified as traumatic: Secondary | ICD-10-CM | POA: Diagnosis not present

## 2024-01-18 DIAGNOSIS — I10 Essential (primary) hypertension: Secondary | ICD-10-CM

## 2024-01-18 DIAGNOSIS — I129 Hypertensive chronic kidney disease with stage 1 through stage 4 chronic kidney disease, or unspecified chronic kidney disease: Secondary | ICD-10-CM | POA: Diagnosis not present

## 2024-01-18 DIAGNOSIS — Z853 Personal history of malignant neoplasm of breast: Secondary | ICD-10-CM | POA: Diagnosis not present

## 2024-01-18 DIAGNOSIS — G4730 Sleep apnea, unspecified: Secondary | ICD-10-CM

## 2024-01-18 DIAGNOSIS — Z96611 Presence of right artificial shoulder joint: Secondary | ICD-10-CM | POA: Diagnosis not present

## 2024-01-18 DIAGNOSIS — Z79899 Other long term (current) drug therapy: Secondary | ICD-10-CM | POA: Diagnosis not present

## 2024-01-18 DIAGNOSIS — Z96653 Presence of artificial knee joint, bilateral: Secondary | ICD-10-CM | POA: Insufficient documentation

## 2024-01-18 DIAGNOSIS — M19012 Primary osteoarthritis, left shoulder: Secondary | ICD-10-CM

## 2024-01-18 DIAGNOSIS — M25512 Pain in left shoulder: Secondary | ICD-10-CM | POA: Diagnosis present

## 2024-01-18 DIAGNOSIS — N189 Chronic kidney disease, unspecified: Secondary | ICD-10-CM | POA: Diagnosis not present

## 2024-01-18 DIAGNOSIS — Z96612 Presence of left artificial shoulder joint: Principal | ICD-10-CM

## 2024-01-18 HISTORY — PX: REVERSE SHOULDER ARTHROPLASTY: SHX5054

## 2024-01-18 SURGERY — ARTHROPLASTY, SHOULDER, TOTAL, REVERSE
Anesthesia: General | Site: Shoulder | Laterality: Left

## 2024-01-18 MED ORDER — MELATONIN 5 MG PO TABS
10.0000 mg | ORAL_TABLET | Freq: Every day | ORAL | Status: DC
Start: 2024-01-18 — End: 2024-01-19
  Administered 2024-01-18: 10 mg via ORAL
  Filled 2024-01-18: qty 2

## 2024-01-18 MED ORDER — STERILE WATER FOR IRRIGATION IR SOLN
Status: DC | PRN
  Administered 2024-01-18: 2000 mL

## 2024-01-18 MED ORDER — BUPIVACAINE LIPOSOME 1.3 % IJ SUSP
INTRAMUSCULAR | Status: DC | PRN
  Administered 2024-01-18: 10 mL via PERINEURAL

## 2024-01-18 MED ORDER — ROCURONIUM BROMIDE 100 MG/10ML IV SOLN
INTRAVENOUS | Status: DC | PRN
  Administered 2024-01-18: 50 mg via INTRAVENOUS

## 2024-01-18 MED ORDER — TRAMADOL HCL 50 MG PO TABS: 50.0000 mg | ORAL_TABLET | Freq: Four times a day (QID) | ORAL | Status: DC | PRN

## 2024-01-18 MED ORDER — LOSARTAN POTASSIUM 25 MG PO TABS
25.0000 mg | ORAL_TABLET | Freq: Every day | ORAL | Status: DC
  Administered 2024-01-19: 25 mg via ORAL
  Filled 2024-01-18: qty 1

## 2024-01-18 MED ORDER — BISMUTH SUBGALLATE 200 MG PO CHEW: 600.0000 mg | CHEWABLE_TABLET | Freq: Three times a day (TID) | ORAL | Status: DC

## 2024-01-18 MED ORDER — PHENYLEPHRINE HCL (PRESSORS) 10 MG/ML IV SOLN
INTRAVENOUS | Status: AC
  Filled 2024-01-18: qty 1

## 2024-01-18 MED ORDER — PROPOFOL 10 MG/ML IV BOLUS
INTRAVENOUS | Status: DC | PRN
  Administered 2024-01-18: 150 mg via INTRAVENOUS

## 2024-01-18 MED ORDER — OXYCODONE HCL 5 MG/5ML PO SOLN: 5.0000 mg | Freq: Once | ORAL | Status: DC | PRN

## 2024-01-18 MED ORDER — RISAQUAD PO CAPS
1.0000 | ORAL_CAPSULE | Freq: Every day | ORAL | Status: DC
  Administered 2024-01-19: 1 via ORAL
  Filled 2024-01-18: qty 1

## 2024-01-18 MED ORDER — DEXAMETHASONE SODIUM PHOSPHATE 10 MG/ML IJ SOLN
INTRAMUSCULAR | Status: AC
  Filled 2024-01-18: qty 1

## 2024-01-18 MED ORDER — FUROSEMIDE 20 MG PO TABS
20.0000 mg | ORAL_TABLET | Freq: Every day | ORAL | Status: DC
  Administered 2024-01-19: 20 mg via ORAL
  Filled 2024-01-18: qty 1

## 2024-01-18 MED ORDER — VANCOMYCIN HCL 1000 MG IV SOLR
INTRAVENOUS | Status: AC
  Filled 2024-01-18: qty 20

## 2024-01-18 MED ORDER — DIPHENHYDRAMINE HCL 25 MG PO CAPS
25.0000 mg | ORAL_CAPSULE | Freq: Every day | ORAL | Status: DC
  Administered 2024-01-18: 25 mg via ORAL
  Filled 2024-01-18: qty 1

## 2024-01-18 MED ORDER — BUPIVACAINE-EPINEPHRINE (PF) 0.25% -1:200000 IJ SOLN
INTRAMUSCULAR | Status: DC | PRN
  Administered 2024-01-18: 20 mL

## 2024-01-18 MED ORDER — VANCOMYCIN HCL 1 G IV SOLR
INTRAVENOUS | Status: DC | PRN
  Administered 2024-01-18: 1000 mg via TOPICAL

## 2024-01-18 MED ORDER — ONDANSETRON HCL 4 MG PO TABS: 4.0000 mg | ORAL_TABLET | Freq: Four times a day (QID) | ORAL | Status: DC | PRN

## 2024-01-18 MED ORDER — PHENYLEPHRINE HCL-NACL 20-0.9 MG/250ML-% IV SOLN
INTRAVENOUS | Status: AC
  Filled 2024-01-18: qty 250

## 2024-01-18 MED ORDER — PROPOFOL 10 MG/ML IV BOLUS
INTRAVENOUS | Status: AC
Start: 2024-01-18 — End: 2024-01-18
  Filled 2024-01-18: qty 20

## 2024-01-18 MED ORDER — METHOCARBAMOL 1000 MG/10ML IJ SOLN: 500.0000 mg | Freq: Four times a day (QID) | INTRAMUSCULAR | Status: DC | PRN

## 2024-01-18 MED ORDER — NITROFURANTOIN MACROCRYSTAL 100 MG PO CAPS
100.0000 mg | ORAL_CAPSULE | Freq: Every day | ORAL | Status: DC
  Filled 2024-01-18: qty 1

## 2024-01-18 MED ORDER — METOCLOPRAMIDE HCL 5 MG/ML IJ SOLN: 5.0000 mg | Freq: Three times a day (TID) | INTRAMUSCULAR | Status: DC | PRN

## 2024-01-18 MED ORDER — POTASSIUM CHLORIDE CRYS ER 20 MEQ PO TBCR
20.0000 meq | EXTENDED_RELEASE_TABLET | Freq: Once | ORAL | Status: AC
  Administered 2024-01-18: 20 meq via ORAL
  Filled 2024-01-18: qty 1

## 2024-01-18 MED ORDER — LACTATED RINGERS IV SOLN
INTRAVENOUS | Status: DC
Start: 2024-01-18 — End: 2024-01-18

## 2024-01-18 MED ORDER — BUPIVACAINE HCL (PF) 0.5 % IJ SOLN
INTRAMUSCULAR | Status: DC | PRN
  Administered 2024-01-18: 15 mL via PERINEURAL

## 2024-01-18 MED ORDER — PHENOL 1.4 % MT LIQD: 1.0000 | OROMUCOSAL | Status: DC | PRN

## 2024-01-18 MED ORDER — METOCLOPRAMIDE HCL 5 MG PO TABS: 5.0000 mg | ORAL_TABLET | Freq: Three times a day (TID) | ORAL | Status: DC | PRN

## 2024-01-18 MED ORDER — SUGAMMADEX SODIUM 200 MG/2ML IV SOLN
INTRAVENOUS | Status: DC | PRN
  Administered 2024-01-18: 200 mg via INTRAVENOUS

## 2024-01-18 MED ORDER — BUPIVACAINE-EPINEPHRINE (PF) 0.25% -1:200000 IJ SOLN
INTRAMUSCULAR | Status: AC
  Filled 2024-01-18: qty 30

## 2024-01-18 MED ORDER — DOCUSATE SODIUM 100 MG PO CAPS
100.0000 mg | ORAL_CAPSULE | Freq: Two times a day (BID) | ORAL | Status: DC
Start: 2024-01-18 — End: 2024-01-19
  Filled 2024-01-18 (×2): qty 1

## 2024-01-18 MED ORDER — ALLOPURINOL 300 MG PO TABS
300.0000 mg | ORAL_TABLET | Freq: Every day | ORAL | Status: DC
  Administered 2024-01-19: 300 mg via ORAL
  Filled 2024-01-18: qty 1

## 2024-01-18 MED ORDER — OXYCODONE HCL 5 MG PO TABS: 5.0000 mg | ORAL_TABLET | Freq: Once | ORAL | Status: DC | PRN

## 2024-01-18 MED ORDER — ONDANSETRON HCL 4 MG/2ML IJ SOLN
INTRAMUSCULAR | Status: AC
  Filled 2024-01-18: qty 2

## 2024-01-18 MED ORDER — LACTATED RINGERS IV SOLN: INTRAVENOUS | Status: DC | PRN

## 2024-01-18 MED ORDER — EPHEDRINE SULFATE (PRESSORS) 50 MG/ML IJ SOLN
INTRAMUSCULAR | Status: DC | PRN
Start: 2024-01-18 — End: 2024-01-18
  Administered 2024-01-18: 10 mg via INTRAVENOUS

## 2024-01-18 MED ORDER — ONDANSETRON HCL 4 MG/2ML IJ SOLN: 4.0000 mg | Freq: Four times a day (QID) | INTRAMUSCULAR | Status: DC | PRN

## 2024-01-18 MED ORDER — TRANEXAMIC ACID-NACL 1000-0.7 MG/100ML-% IV SOLN
1000.0000 mg | INTRAVENOUS | Status: AC
  Administered 2024-01-18: 1000 mg via INTRAVENOUS
  Filled 2024-01-18: qty 100

## 2024-01-18 MED ORDER — CEFAZOLIN SODIUM-DEXTROSE 2-4 GM/100ML-% IV SOLN
2.0000 g | Freq: Four times a day (QID) | INTRAVENOUS | Status: AC
  Administered 2024-01-18 (×2): 2 g via INTRAVENOUS
  Filled 2024-01-18 (×2): qty 100

## 2024-01-18 MED ORDER — CHLORHEXIDINE GLUCONATE 0.12 % MT SOLN
15.0000 mL | Freq: Once | OROMUCOSAL | Status: AC
  Administered 2024-01-18: 15 mL via OROMUCOSAL

## 2024-01-18 MED ORDER — SODIUM CHLORIDE 0.9 % IV SOLN
INTRAVENOUS | Status: AC
Start: 2024-01-18 — End: 2024-01-19

## 2024-01-18 MED ORDER — VITAMIN D 25 MCG (1000 UNIT) PO TABS
1000.0000 [IU] | ORAL_TABLET | Freq: Every day | ORAL | Status: DC
  Administered 2024-01-19: 1000 [IU] via ORAL
  Filled 2024-01-18: qty 1

## 2024-01-18 MED ORDER — ONDANSETRON HCL 4 MG/2ML IJ SOLN
INTRAMUSCULAR | Status: DC | PRN
  Administered 2024-01-18: 4 mg via INTRAVENOUS

## 2024-01-18 MED ORDER — TRANEXAMIC ACID-NACL 1000-0.7 MG/100ML-% IV SOLN
1000.0000 mg | Freq: Once | INTRAVENOUS | Status: AC
  Administered 2024-01-18: 1000 mg via INTRAVENOUS
  Filled 2024-01-18: qty 100

## 2024-01-18 MED ORDER — METOPROLOL SUCCINATE ER 50 MG PO TB24
50.0000 mg | ORAL_TABLET | Freq: Every day | ORAL | Status: DC
  Administered 2024-01-19: 50 mg via ORAL
  Filled 2024-01-18: qty 1

## 2024-01-18 MED ORDER — ACETAMINOPHEN 325 MG PO TABS
650.0000 mg | ORAL_TABLET | Freq: Three times a day (TID) | ORAL | Status: DC | PRN
  Administered 2024-01-19: 975 mg via ORAL
  Filled 2024-01-18: qty 3

## 2024-01-18 MED ORDER — TRAMADOL HCL 50 MG PO TABS: 50.0000 mg | ORAL_TABLET | Freq: Four times a day (QID) | ORAL | 0 refills | Status: AC | PRN

## 2024-01-18 MED ORDER — DEXAMETHASONE SODIUM PHOSPHATE 10 MG/ML IJ SOLN
INTRAMUSCULAR | Status: DC | PRN
Start: 2024-01-18 — End: 2024-01-18
  Administered 2024-01-18: 8 mg via INTRAVENOUS

## 2024-01-18 MED ORDER — CEFAZOLIN SODIUM-DEXTROSE 2-4 GM/100ML-% IV SOLN
2.0000 g | INTRAVENOUS | Status: AC
  Administered 2024-01-18: 2 g via INTRAVENOUS
  Filled 2024-01-18: qty 100

## 2024-01-18 MED ORDER — ORAL CARE MOUTH RINSE: 15.0000 mL | Freq: Once | OROMUCOSAL | Status: AC

## 2024-01-18 MED ORDER — FENTANYL CITRATE PF 50 MCG/ML IJ SOSY: 25.0000 ug | PREFILLED_SYRINGE | INTRAMUSCULAR | Status: DC | PRN

## 2024-01-18 MED ORDER — MENTHOL 3 MG MT LOZG: 1.0000 | LOZENGE | OROMUCOSAL | Status: DC | PRN

## 2024-01-18 MED ORDER — PHENYLEPHRINE HCL-NACL 20-0.9 MG/250ML-% IV SOLN
INTRAVENOUS | Status: DC | PRN
  Administered 2024-01-18: 35 ug/min via INTRAVENOUS

## 2024-01-18 MED ORDER — 0.9 % SODIUM CHLORIDE (POUR BTL) OPTIME
TOPICAL | Status: DC | PRN
  Administered 2024-01-18: 1000 mL

## 2024-01-18 MED ORDER — FENTANYL CITRATE (PF) 100 MCG/2ML IJ SOLN
INTRAMUSCULAR | Status: DC | PRN
  Administered 2024-01-18 (×2): 50 ug via INTRAVENOUS

## 2024-01-18 MED ORDER — PROPOFOL 10 MG/ML IV BOLUS
INTRAVENOUS | Status: AC
  Filled 2024-01-18: qty 20

## 2024-01-18 MED ORDER — FENTANYL CITRATE (PF) 100 MCG/2ML IJ SOLN
INTRAMUSCULAR | Status: AC
  Filled 2024-01-18: qty 2

## 2024-01-18 MED ORDER — METHOCARBAMOL 500 MG PO TABS: 500.0000 mg | ORAL_TABLET | Freq: Four times a day (QID) | ORAL | Status: DC | PRN

## 2024-01-18 SURGICAL SUPPLY — 59 items
BAG COUNTER SPONGE SURGICOUNT (BAG) IMPLANT
BAG ZIPLOCK 12X15 (MISCELLANEOUS) IMPLANT
BIT DRILL 1.6MX128 (BIT) IMPLANT
BIT DRILL 170X2.5X (BIT) ×1 IMPLANT
BLADE SAG 18X100X1.27 (BLADE) ×3 IMPLANT
COVER BACK TABLE 60X90IN (DRAPES) ×3 IMPLANT
COVER SURGICAL LIGHT HANDLE (MISCELLANEOUS) ×3 IMPLANT
DRAPE INCISE IOBAN 66X45 STRL (DRAPES) ×3 IMPLANT
DRAPE POUCH INSTRU U-SHP 10X18 (DRAPES) ×3 IMPLANT
DRAPE SHEET LG 3/4 BI-LAMINATE (DRAPES) ×3 IMPLANT
DRAPE SURG ORHT 6 SPLT 77X108 (DRAPES) ×6 IMPLANT
DRAPE TOP 10253 STERILE (DRAPES) ×3 IMPLANT
DRAPE U-SHAPE 47X51 STRL (DRAPES) ×3 IMPLANT
DRSG ADAPTIC 3X8 NADH LF (GAUZE/BANDAGES/DRESSINGS) ×2 IMPLANT
DRSG AQUACEL AG ADV 3.5X10 (GAUZE/BANDAGES/DRESSINGS) ×2 IMPLANT
DURAPREP 26ML APPLICATOR (WOUND CARE) ×3 IMPLANT
ELECT BLADE TIP CTD 4 INCH (ELECTRODE) ×3 IMPLANT
ELECT NDL TIP 2.8 STRL (NEEDLE) ×2 IMPLANT
ELECT NEEDLE TIP 2.8 STRL (NEEDLE) ×2 IMPLANT
ELECT PENCIL ROCKER SW 15FT (MISCELLANEOUS) ×3 IMPLANT
ELECT REM PT RETURN 15FT ADLT (MISCELLANEOUS) ×3 IMPLANT
EPIPHYSIS LT SZ 1 (Orthopedic Implant) ×1 IMPLANT
FACESHIELD WRAPAROUND OR TEAM (MASK) ×4 IMPLANT
GAUZE PAD ABD 8X10 STRL (GAUZE/BANDAGES/DRESSINGS) ×3 IMPLANT
GAUZE SPONGE 4X4 12PLY STRL (GAUZE/BANDAGES/DRESSINGS) ×3 IMPLANT
GLENOSPHERE DELTA XTEND LAT 38 (Miscellaneous) ×1 IMPLANT
GLOVE BIOGEL PI IND STRL 7.5 (GLOVE) ×3 IMPLANT
GLOVE BIOGEL PI IND STRL 8.5 (GLOVE) ×3 IMPLANT
GLOVE ORTHO TXT STRL SZ7.5 (GLOVE) ×3 IMPLANT
GLOVE SURG ORTHO 8.5 STRL (GLOVE) ×3 IMPLANT
GOWN STRL REUS W/ TWL XL LVL3 (GOWN DISPOSABLE) ×6 IMPLANT
KIT BASIN OR (CUSTOM PROCEDURE TRAY) ×3 IMPLANT
KIT TURNOVER KIT A (KITS) ×3 IMPLANT
MANIFOLD NEPTUNE II (INSTRUMENTS) ×3 IMPLANT
METAGLENE DELTA EXTEND (Trauma) ×1 IMPLANT
NDL MAYO CATGUT SZ4 TPR NDL (NEEDLE) IMPLANT
NEEDLE MAYO CATGUT SZ4 (NEEDLE) IMPLANT
NS IRRIG 1000ML POUR BTL (IV SOLUTION) ×3 IMPLANT
PACK SHOULDER (CUSTOM PROCEDURE TRAY) ×3 IMPLANT
PIN GUIDE 1.2 (PIN) ×1 IMPLANT
PIN GUIDE GLENOPHERE 1.5MX300M (PIN) ×1 IMPLANT
PIN METAGLENE 2.5 (PIN) ×1 IMPLANT
RESTRAINT HEAD UNIVERSAL NS (MISCELLANEOUS) ×3 IMPLANT
SCREW 4.5X36MM (Screw) ×1 IMPLANT
SCREW LOCK DELTA XTEND 4.5X30 (Screw) ×1 IMPLANT
SLING ARM FOAM STRAP LRG (SOFTGOODS) ×1 IMPLANT
SPACER 38 PLUS 3 (Spacer) ×1 IMPLANT
SPIKE FLUID TRANSFER (MISCELLANEOUS) ×3 IMPLANT
STEM DELTA DIA 10 HA (Stem) ×1 IMPLANT
STRIP CLOSURE SKIN 1/2X4 (GAUZE/BANDAGES/DRESSINGS) ×4 IMPLANT
SUT MNCRL AB 4-0 PS2 18 (SUTURE) ×4 IMPLANT
SUT VIC AB 0 CT1 36 (SUTURE) ×3 IMPLANT
SUT VIC AB 0 CT2 27 (SUTURE) ×3 IMPLANT
SUT VIC AB 2-0 CT1 TAPERPNT 27 (SUTURE) ×5 IMPLANT
SUTURE FIBERWR #2 38 T-5 BLUE (SUTURE) ×6 IMPLANT
SUTURE FIBERWR#2 38 REV NDL BL (SUTURE) IMPLANT
TOWEL GREEN STERILE FF (TOWEL DISPOSABLE) ×3 IMPLANT
TOWEL OR 17X26 10 PK STRL BLUE (TOWEL DISPOSABLE) ×3 IMPLANT
YANKAUER SUCT BULB TIP NO VENT (SUCTIONS) ×3 IMPLANT

## 2024-01-18 NOTE — Brief Op Note (Signed)
 01/18/2024  9:39 AM  PATIENT:  Deanna Burke  73 y.o. female  PRE-OPERATIVE DIAGNOSIS:  left shoulder arthritis, end stage and RC tear  POST-OPERATIVE DIAGNOSIS:  left shoulder arthritis, end stage and RC tear  PROCEDURE:  Procedure(s): LEFT REVERSE TOTAL SHOULDER ARTHROPLASTY, LIMPOMA REMOVAL (Left)  DePuy Delta Xtend   SURGEON:  Surgeons and Role:    DEWAINE Kay Kemps, MD - Primary  PHYSICIAN ASSISTANT:   ASSISTANTS: Debby KATHEE Fireman PA-C   ANESTHESIA:   regional and general  EBL:  minimal  BLOOD ADMINISTERED:none  DRAINS: none   LOCAL MEDICATIONS USED:  MARCAINE      SPECIMEN:  mass(lipoma) left shoulder  DISPOSITION OF SPECIMEN:  PATHOLOGY  COUNTS:  YES  TOURNIQUET:  * No tourniquets in log *  DICTATION: .Other Dictation: Dictation Number [id number]  PLAN OF CARE: admit to observation  PATIENT DISPOSITION:  PACU - hemodynamically stable.   Delay start of Pharmacological VTE agent (>24hrs) due to surgical blood loss or risk of bleeding: not applicable

## 2024-01-18 NOTE — Discharge Instructions (Addendum)
 Ice to the shoulder constantly.  Keep the incision covered and clean and dry for one week, then ok to get it wet in the shower. Leave Aquacel bandage on for one week then ok to remove it. This bandage is waterproof so you CAN shower with it on.   Do exercise as instructed several times per day.  DO NOT reach behind your back or push up out of a chair with the operative arm.  Use a sling while you are up and around for comfort, may remove while seated.  Keep pillow propped behind the operative elbow.  Follow up with Dr Kay in two weeks in the office, call [phone] for appt  Please call Dr Kay (cell) [phone] with any questions or concerns

## 2024-01-18 NOTE — Transfer of Care (Signed)
 Immediate Anesthesia Transfer of Care Note  Patient: Deanna Burke  Procedure(s) Performed: LEFT REVERSE TOTAL SHOULDER ARTHROPLASTY, LIMPOMA REMOVAL (Left: Shoulder)  Patient Location: PACU  Anesthesia Type:General  Level of Consciousness: awake and alert   Airway & Oxygen Therapy: Patient Spontanous Breathing and Patient connected to nasal cannula oxygen  Post-op Assessment: Report given to RN and Post -op Vital signs reviewed and stable  Post vital signs: Reviewed and stable  Last Vitals:  Vitals Value Taken Time  BP 146/68 01/18/24 09:48  Temp 35.9 C 01/18/24 09:48  Pulse 88 01/18/24 09:51  Resp 17 01/18/24 09:51  SpO2 98 % 01/18/24 09:51  Vitals shown include unfiled device data.  Last Pain:  Vitals:   01/18/24 0948  TempSrc:   PainSc: 0-No pain         Complications: No notable events documented.

## 2024-01-18 NOTE — Anesthesia Postprocedure Evaluation (Signed)
 Anesthesia Post Note  Patient: Deanna Burke  Procedure(s) Performed: LEFT REVERSE TOTAL SHOULDER ARTHROPLASTY, LIMPOMA REMOVAL (Left: Shoulder)     Patient location during evaluation: PACU Anesthesia Type: General and Regional Level of consciousness: awake and alert Pain management: pain level controlled Vital Signs Assessment: post-procedure vital signs reviewed and stable Respiratory status: spontaneous breathing, nonlabored ventilation, respiratory function stable and patient connected to nasal cannula oxygen Cardiovascular status: blood pressure returned to baseline and stable Postop Assessment: no apparent nausea or vomiting Anesthetic complications: no   No notable events documented.  Last Vitals:  Vitals:   01/18/24 1130 01/18/24 1209  BP:  110/71  Pulse: 73 83  Resp: 15 14  Temp:  36.6 C  SpO2: 97% 97%    Last Pain:  Vitals:   01/18/24 1100  TempSrc:   PainSc: 0-No pain                 Denissa Cozart S

## 2024-01-18 NOTE — Interval H&P Note (Signed)
 History and Physical Interval Note:  01/18/2024 7:26 AM  Deanna Burke  has presented today for surgery, with the diagnosis of left shoulder arthritis.  The various methods of treatment have been discussed with the patient and family. After consideration of risks, benefits and other options for treatment, the patient has consented to  Procedure(s): ARTHROPLASTY, SHOULDER, TOTAL, REVERSE (Left) EXCISION, MASS, UPPER EXTREMITY (Left) as a surgical intervention.  The patient's history has been reviewed, patient examined, no change in status, stable for surgery.  I have reviewed the patient's chart and labs.  Questions were answered to the patient's satisfaction.     [name]

## 2024-01-18 NOTE — Anesthesia Procedure Notes (Signed)
 Procedure Name: Intubation Date/Time: 01/18/2024 7:44 AM  Performed by: Dartha Meckel, CRNAPre-anesthesia Checklist: Patient identified, Emergency Drugs available, Suction available and Patient being monitored Patient Re-evaluated:Patient Re-evaluated prior to induction Oxygen Delivery Method: Circle system utilized Preoxygenation: Pre-oxygenation with 100% oxygen Induction Type: IV induction Ventilation: Mask ventilation without difficulty Laryngoscope Size: Mac and 3 Grade View: Grade I Tube type: Oral Tube size: 7.0 mm Number of attempts: 1 Airway Equipment and Method: Stylet and Oral airway Placement Confirmation: ETT inserted through vocal cords under direct vision, positive ETCO2 and breath sounds checked- equal and bilateral Secured at: 21 cm Tube secured with: Tape Dental Injury: Teeth and Oropharynx as per pre-operative assessment

## 2024-01-18 NOTE — Anesthesia Procedure Notes (Signed)
 Anesthesia Regional Block: Interscalene brachial plexus block   Pre-Anesthetic Checklist: , timeout performed,  Correct Patient, Correct Site, Correct Laterality,  Correct Procedure, Correct Position, site marked,  Risks and benefits discussed,  Surgical consent,  Pre-op evaluation,  At surgeon's request and post-op pain management  Laterality: Left  Prep: chloraprep       Needles:  Injection technique: Single-shot  Needle Type: Echogenic Stimulator Needle     Needle Length: 5cm  Needle Gauge: 22     Additional Needles:   Procedures:, nerve stimulator,,,,,     Nerve Stimulator or Paresthesia:  Response: biceps flexion, 0.45 mA  Additional Responses:   Narrative:  Start time: 01/18/2024 7:05 AM End time: 01/18/2024 7:13 AM Injection made incrementally with aspirations every 5 mL.  Performed by: Personally  Anesthesiologist: Maryclare Cornet, MD  Additional Notes: Functioning IV was confirmed and monitors were applied.  A 50mm 22ga Arrow echogenic stimulator needle was used. Sterile prep and drape,hand hygiene and sterile gloves were used.  Negative aspiration and negative test dose prior to incremental administration of local anesthetic. The patient tolerated the procedure well.  Ultrasound guidance: relevent anatomy identified, needle position confirmed, local anesthetic spread visualized around nerve(s), vascular puncture avoided.  Image printed for medical record.

## 2024-01-18 NOTE — Op Note (Addendum)
 NAME: Deanna Burke, Deanna F. MEDICAL RECORD NO: [id number] ACCOUNT NO: [id number] DATE OF BIRTH: 1951/04/19 FACILITY: [facility] LOCATION: [facility] PHYSICIAN: Elspeth SAUNDERS. Kay, MD  Operative Report   DATE OF PROCEDURE: 01/18/2024  PREOPERATIVE DIAGNOSES: 1.  Left shoulder mass/suspected lipoma. 2.  Left shoulder rotator cuff tear arthropathy.  POSTOPERATIVE DIAGNOSES: 1.  Left shoulder mass/suspected lipoma. 2.  Left shoulder rotator cuff tear arthropathy.  PROCEDURES PERFORMED: 1.  Left shoulder open excision of mass in the subcutaneous plane. 2.  Left reverse total shoulder arthroplasty using DePuy Delta Xtend prosthesis with no subscapularis repair.  ATTENDING SURGEON:  Elspeth SAUNDERS. Kay, MD  ASSISTANT:  Debby Crock Dixon, PA-C, who performed initial surgical patient positioning, draping and assisted with retraction and exposure during the procedure.  He was scrubbed out by Camillo Pesa, RNFA who completed the case and assisted with closure  ANESTHESIA:  General anesthesia was used, plus an interscalene block.  ESTIMATED BLOOD LOSS:  Less than 100 mL.  FLUID REPLACEMENT:  1200 mL crystalloids.  COUNTS:  Instrument count was correct.  COMPLICATIONS:  No complications.  ANTIBIOTICS:  Perioperative antibiotics were given.  INDICATIONS:  The patient is a 73 year old female who presents with a history of left shoulder end-stage OA with rotator cuff tear arthropathy. The patient also has a fairly large subcutaneous mass which is symptomatic and she has desired excisional  biopsy or removal of that mass.  Suspected lipoma. Informed consent obtained.  DESCRIPTION OF PROCEDURE:  After an adequate level of anesthesia was achieved, the patient was positioned in the modified beach chair position.  The left shoulder was correctly identified and sterile prep and drape were performed.  Timeout was called  verifying correct patient and correct site.  We identified the subcutaneous mass over the  lateral shoulder posterior to the deltopectoral incision area.  We made a longitudinal skin incision about 5 cm over the mass.  Dissection down through the  subcutaneous tissues until the apparent lipoma was identified.  That was dissected using just finger dissection from surrounding tissue.  We removed that to the back table and we sent that for final pathology in formalin.  We irrigated thoroughly and  closed the subcuticular layer with 2-0 Vicryl followed by 4-0 Monocryl for skin.  We then went to the deltopectoral incision starting at the coracoid and extending down to the humerus with a #10 blade scalpel dissection down through the subcutaneous tissues using Bovie, cephalic vein identified and taken laterally to the deltoid.  Pectoralis was taken medially. The conjoined tendon was identified and retracted medially. Deep retractor was placed. We tenodesed the biceps in situ with #0 Vicryl figure-of-eight suture incorporating part of the pec tendon. We then went and released  the subscapularis remnant off the lesser tuberosity. This was in poor condition and not able to be repaired, but we tagged it for protection of the axillary nerve. We released the inferior capsule. We extended the shoulder delivering the humeral head out  of the wound. We entered the humerus with a 6 mm reamer reaming up to a size 10. We then placed our 10-mm T-handle guide and resected the head at 20 degrees of retroversion with the oscillating saw. We irrigated. We then removed excess osteophytes with  a rongeur. We subluxed the humerus posteriorly. We had good exposure of the glenoid. We removed the biceps, the labrum, and the remaining cartilage on the glenoid. We then went ahead and found our center point and placed our guide pin bicortically,  inferiorly angled  slightly. We reamed it to subchondral bone for the Metaglene baseplate. We then did our peripheral T-handle reamer. We then drilled our central peg hole. We impacted  the HA-coated press-fit baseplate into position. The baseplate  overhung anteriorly and posteriorly as small as the patient's glenoid was, but we had good bony support for it. We placed a 36 mm screw inferiorly and locked that and a 30 mm screw superiorly and locked that. We then selected the 38 +0 standard  glenosphere and attached to the baseplate with the screwdriver. I did a finger sweep to make sure we had no soft tissue caught up between the baseplate and the glenosphere.  We then went back to the humeral side and reamed for the 1-left metaphysis. Next, we trialled with the 10 stem in the 1-left metaphysis set in the 0 setting and placed in 20 degrees of retroversion. We reduced with a 38 +3 poly trial on the humeral tray.  Nice little pops that reduced appropriate conjoint tension and good stability throughout a full arc of motion.  We irrigated thoroughly, went ahead and removed the trial components from the humeral side.  We used available bone graft from the humeral head and with impaction grafting technique, impacted the HA-coated press-fit 10 stem with the 1-left metaphysis set  in the 0 setting and impacted in 20 degrees of retroversion. We had a nice stable stem.  We selected the real 38 +3 poly, impacted on the humeral tray, reduced the shoulder.  We were happy again with her stability and range of motion.  We went ahead and  irrigated one final time and then I placed 1 g of vancomycin  powder inside the wound deep.  We then closed the deltopectoral interval with #0 Vicryl suture followed by 2-0 Vicryl for subcutaneous closure and 4-0 Monocryl for the skin.  Steri-Strips were  applied followed by a sterile dressing.  The patient tolerated the surgery well.  She was taken to the recovery room in stable condition.   MUK D: 01/18/2024 9:46:56 am T: 01/18/2024 10:17:00 am  JOB: [id number]/ [id number]

## 2024-01-19 DIAGNOSIS — M75102 Unspecified rotator cuff tear or rupture of left shoulder, not specified as traumatic: Secondary | ICD-10-CM | POA: Diagnosis not present

## 2024-01-19 MED ORDER — NITROFURANTOIN MONOHYD MACRO 100 MG PO CAPS
100.0000 mg | ORAL_CAPSULE | Freq: Every day | ORAL | Status: DC
  Administered 2024-01-19: 100 mg via ORAL
  Filled 2024-01-19: qty 1

## 2024-01-19 NOTE — Plan of Care (Signed)
  Problem: Education: Goal: Knowledge of General Education information will improve Description: Including pain rating scale, medication(s)/side effects and non-pharmacologic comfort measures Outcome: Adequate for Discharge   Problem: Health Behavior/Discharge Planning: Goal: Ability to manage health-related needs will improve Outcome: Progressing   Problem: Clinical Measurements: Goal: Ability to maintain clinical measurements within normal limits will improve Outcome: Progressing Goal: Will remain free from infection Outcome: Progressing Goal: Diagnostic test results will improve Outcome: Progressing Goal: Respiratory complications will improve Outcome: Progressing Goal: Cardiovascular complication will be avoided Outcome: Progressing   Problem: Activity: Goal: Risk for activity intolerance will decrease Outcome: Adequate for Discharge   Problem: Nutrition: Goal: Adequate nutrition will be maintained Outcome: Completed/Met   Problem: Coping: Goal: Level of anxiety will decrease Outcome: Progressing   Problem: Elimination: Goal: Will not experience complications related to bowel motility Outcome: Completed/Met Goal: Will not experience complications related to urinary retention Outcome: Completed/Met   Problem: Pain Managment: Goal: General experience of comfort will improve and/or be controlled Outcome: Progressing   Problem: Safety: Goal: Ability to remain free from injury will improve Outcome: Progressing   Problem: Safety: Goal: Ability to remain free from injury will improve Outcome: Progressing   Problem: Safety: Goal: Ability to remain free from injury will improve Outcome: Progressing   Problem: Skin Integrity: Goal: Risk for impaired skin integrity will decrease Outcome: Adequate for Discharge   Problem: Education: Goal: Knowledge of the prescribed therapeutic regimen will improve Outcome: Progressing Goal: Understanding of activity  limitations/precautions following surgery will improve Outcome: Adequate for Discharge Goal: Individualized Educational Video(s) Outcome: Completed/Met   Problem: Activity: Goal: Ability to tolerate increased activity will improve Outcome: Adequate for Discharge   Problem: Pain Management: Goal: Pain level will decrease with appropriate interventions Outcome: Adequate for Discharge

## 2024-01-19 NOTE — Discharge Summary (Signed)
 Physician Discharge Summary  Patient ID: Deanna Burke MRN: [id number] DOB/AGE: 75-Jul-1952 75 y.o.  Admit date: 01/18/2024 Discharge date: 01/19/2024  Admission Diagnoses:  H/O total shoulder replacement, left  Discharge Diagnoses:  Principal Problem:   H/O total shoulder replacement, left   Past Medical History:  Diagnosis Date   Abnormal finding on cardiovascular stress test, ischemia anterolateral 02/13/2012   follow heart cath - pt told one tiny blockage that didn't even matter   Anemia 1/2/-21   Arthritis    hands, lumbar spine, hips, right ankle   Breast cancer (HCC)    Cancer (HCC)    DCIS L breast   Cataract    Left Eye   Complication of anesthesia 1996   pt has ileostomy and had surgery in 1996 that paralyzed   Fatigue    H/O nephrostomy 08/13/2012   H/O ulcerative colitis    History of bone density study 2012   History of kidney stones 2015   nephrectomy   History of recurrent UTIs 02/13/2012   Hot flashes    Hypertension    Ileostomy in place, secondary to ulcerative colitis x 20 years 02/13/2012   Kidney disease, 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 anginal equivilant 02/13/2012   Torn rotator cuff 2005   Ulcerative colitis 1969-1973    Surgeries: Procedure(s): LEFT REVERSE TOTAL SHOULDER ARTHROPLASTY, LIMPOMA REMOVAL on 01/18/2024   Consultants (if any):   Discharged Condition: Improved  Hospital Course: Deanna Burke is an 73 y.o. female who was admitted 01/18/2024  with a diagnosis of H/O total shoulder replacement, left and went to the operating room on 01/18/2024 and underwent the above named procedures.    She was given perioperative antibiotics:  Anti-infectives (From admission, onward)    Start     Dose/Rate Route Frequency Ordered Stop   01/19/24 1000  nitrofurantoin  (MACRODANTIN ) capsule 100 mg  Status:  Discontinued        100 mg Oral Daily 01/18/24 1119 01/19/24 0744   01/19/24 1000  nitrofurantoin  (macrocrystal-monohydrate) (MACROBID ) capsule 100 mg        100 mg Oral Daily 01/19/24 0744     01/18/24 1400  ceFAZolin  (ANCEF ) IVPB 2g/100 mL premix       Note to Pharmacy: Patient tolerated in the OR   2 g 200 mL/hr over 30 Minutes Intravenous Every 6 hours 01/18/24 1119 01/18/24 2100   01/18/24 0859  vancomycin  (VANCOCIN ) powder  Status:  Discontinued          As needed 01/18/24 0901 01/18/24 0943   01/18/24 0615  ceFAZolin  (ANCEF ) IVPB 2g/100 mL premix        2 g 200 mL/hr over 30 Minutes Intravenous On call to O.R. 01/18/24 9392 01/18/24 0751       She was given sequential compression devices, early ambulation for DVT prophylaxis.  POD#1 Patient doing well. She cleared OT. D/c home. Follow-up in office with  Dr. Kay.   She benefited maximally from the hospital stay and there were no complications.    Recent vital signs:  Vitals:   01/19/24 0535 01/19/24 0938  BP: 125/62 (!) 131/54  Pulse: 88 98  Resp:    Temp: 98.2 F (36.8 C)   SpO2: 96%     Recent laboratory studies:  Lab Results  Component Value Date   HGB 14.1 01/07/2024   HGB 14.3 10/08/2023   HGB 14.0 08/28/2023   Lab Results  Component Value Date   WBC 6.2 01/07/2024   PLT 289 01/07/2024   Lab Results  Component Value Date   INR 0.99 03/08/2016   Lab Results  Component Value Date   NA 138 01/07/2024   K 4.3 01/07/2024   CL 104 01/07/2024   CO2 20 (L) 01/07/2024   BUN 30 (H) 01/07/2024   CREATININE 1.07 (H) 01/07/2024   GLUCOSE 102 (H) 01/07/2024      Allergies as of 01/19/2024       Reactions   Demerol  Nausea And Vomiting   Dilaudid  [hydromorphone  Hcl] Nausea And Vomiting, Other (See Comments)   Muscle cramping   Reglan  [metoclopramide ] Other (See Comments)   Cramps in hands   Stadol [butorphanol Tartrate] Nausea And Vomiting   Injection   Sulfamethoxazole  Nausea And Vomiting   Cephalexin Rash   Penicillins Rash   Rash only; note also rash to Keflex Has patient had a PCN reaction causing immediate rash, facial/tongue/throat swelling, SOB or lightheadedness with hypotension: rash Has patient had a PCN reaction causing severe rash involving mucus membranes or skin necrosis: no Has patient had a PCN reaction that required hospitalization no Has patient had a PCN reaction occurring within the last 10 years: no If all of the above answers are NO, then may proceed with Cephalosporin use.        Medication List     TAKE these medications    acetaminophen  650 MG CR tablet Commonly known as: TYLENOL  Take 650-1,300 mg by mouth every 8 (eight) hours as needed for pain.   allopurinol  300 MG tablet Commonly known as: ZYLOPRIM  Take 1 tablet (300 mg total) by mouth daily.   Bismuth  Subgallate 200 MG Chew Chew 600 mg by mouth in the morning, at noon, in the evening, and at bedtime.   cholecalciferol  25 MCG (1000 UNIT) tablet Commonly known as: VITAMIN D3 Take 1,000 Units by mouth in the morning.   diphenhydrAMINE  25 MG tablet Commonly known as: SOMINEX Take 25 mg by mouth at bedtime.   furosemide  20 MG tablet Commonly known as: LASIX  TAKE 1 TABLET(20 MG) BY MOUTH DAILY What changed: See the new instructions.   losartan  50 MG tablet Commonly known as: COZAAR  Take 0.5 tablets (25 mg total) by mouth daily.   Melatonin 10 MG Tabs Take 10 mg by mouth at bedtime.   metoprolol  succinate 50 MG 24 hr tablet Commonly known as: TOPROL -XL TAKE 1 TABLET BY MOUTH DAILY   nitrofurantoin  100 MG capsule Commonly known as:  MACRODANTIN  Take 100 mg by mouth daily.   potassium chloride  SA 20 MEQ tablet Commonly known as: KLOR-CON  M TAKE 1 TABLET(20 MEQ) BY MOUTH DAILY   PROBIOTIC DAILY PO Take 1 capsule by mouth in the morning.   traMADol  50 MG tablet Commonly known as: Ultram  Take 1 tablet (50 mg total) by mouth every 6 (six) hours as needed for severe pain (pain score 7-10).  Discharge Care Instructions  (From admission, onward)           Start     Ordered   01/19/24 0000  Change dressing       Comments: Do not remove your dressing.   01/19/24 1120              WEIGHT BEARING   Weight bearing as tolerated with assist device (walker, cane, etc) as directed, use it as long as suggested by your surgeon or therapist, typically at least 4-6 weeks.   EXERCISES  Results after joint replacement surgery are often greatly improved when you follow the exercise, range of motion and muscle strengthening exercises prescribed by your doctor. Safety measures are also important to protect the joint from further injury. Any time any of these exercises cause you to have increased pain or swelling, decrease what you are doing until you are comfortable again and then slowly increase them. If you have problems or questions, call your caregiver or physical therapist for advice.   Rehabilitation is important following a joint replacement. After just a few days of immobilization, the muscles of the leg can become weakened and shrink (atrophy).  These exercises are designed to build up the tone and strength of the thigh and leg muscles and to improve motion. Often times heat used for twenty to thirty minutes before working out will loosen up your tissues and help with improving the range of motion but do not use heat for the first two weeks following surgery (sometimes heat can increase post-operative swelling).   These exercises can be done on a training (exercise) mat, on the floor, on a table  or on a bed. Use whatever works the best and is most comfortable for you.    Use music or television while you are exercising so that the exercises are a pleasant break in your day. This will make your life better with the exercises acting as a break in your routine that you can look forward to.   Perform all exercises about fifteen times, three times per day or as directed.  You should exercise both the operative leg and the other leg as well.    CONSTIPATION  Constipation is defined medically as fewer than three stools per week and severe constipation as less than one stool per week.  Even if you have a regular bowel pattern at home, your normal regimen is likely to be disrupted due to multiple reasons following surgery.  Combination of anesthesia, postoperative narcotics, change in appetite and fluid intake all can affect your bowels.   YOU MUST use at least one of the following options; they are listed in order of increasing strength to get the job done.  They are all available over the counter, and you may need to use some, POSSIBLY even all of these options:    Drink plenty of fluids (prune juice may be helpful) and high fiber foods Colace 100 mg by mouth twice a day  Senokot for constipation as directed and as needed Dulcolax (bisacodyl ), take with full glass of water   Miralax  (polyethylene glycol) once or twice a day as needed.  If you have tried all these things and are unable to have a bowel movement in the first 3-4 days after surgery call either your surgeon or your primary doctor.    If you experience loose stools or diarrhea, hold the medications until you stool forms back up.  If your symptoms do not get better within 1  week or if they get worse, check with your doctor.  If you experience the worst abdominal pain ever or develop nausea or vomiting, please contact the office immediately for further recommendations for treatment.   ITCHING:  If you experience itching with your  medications, try taking only a single pain pill, or even half a pain pill at a time.  You can also use Benadryl  over the counter for itching or also to help with sleep.     MEDICATIONS:  See your medication summary on the "After Visit Summary" that nursing will review with you.  You may have some home medications which will be placed on hold until you complete the course of blood thinner medication.  It is important for you to complete the blood thinner medication as prescribed.  PRECAUTIONS:  If you experience chest pain or shortness of breath - call 911 immediately for transfer to the hospital emergency department.   If you develop a fever greater that 101 F, purulent drainage from wound, increased redness or drainage from wound, foul odor from the wound/dressing, or calf pain - CONTACT YOUR SURGEON.                                                   FOLLOW-UP APPOINTMENTS:  If you do not already have a post-op appointment, please call the office for an appointment to be seen by your surgeon.  Guidelines for how soon to be seen are listed in your "After Visit Summary", but are typically between 1-4 weeks after surgery.    MAKE SURE YOU:  Understand these instructions.  Get help right away if you are not doing well or get worse.    Thank you for letting us  be a part of your medical care team.  It is a privilege we respect greatly.  We hope these instructions will help you stay on track for a fast and full recovery!   Diagnostic Studies: DG Shoulder Left Port Result Date: 01/18/2024 EXAM: 1 VIEW XRAY OF THE LEFT SHOULDER 01/18/2024 10:32:00 AM COMPARISON: MRI 06/04/2023. CLINICAL HISTORY: History of left total shoulder replacement. FINDINGS: BONES AND JOINTS: Left reverse total shoulder arthroplasty noted. Intact hardware in expected position. [facility] joint is unremarkable in appearance. SOFT TISSUES: Subcutaneous emphysema consistent with immediate postoperative status. No abnormal  calcifications. Low lung volumes on the left with indistinct airspace opacity in the left lung base favoring atelectasis over pneumonia. Atheromatous vascular calcification of the aortic arch. IMPRESSION: 1. Left reverse total shoulder arthroplasty with intact hardware in expected position. 2. Subcutaneous emphysema consistent with immediate postoperative status. 3. Low left lung volumes with left basilar atelectasis favored. 4. Atheromatous calcification of the aortic arch. Electronically signed by: Ryan Salvage MD 01/18/2024 03:05 PM EDT RP Workstation: [id number]    Disposition: Discharge disposition: 01-Home or Self Care       Discharge Instructions     Call MD / Call 911   Complete by: As directed    If you experience chest pain or shortness of breath, CALL 911 and be transported to the hospital emergency room.  If you develope a fever above 101 F, pus (white drainage) or increased drainage or redness at the wound, or calf pain, call your surgeon's office.   Change dressing   Complete by: As directed    Do not remove your dressing.  Constipation Prevention   Complete by: As directed    Drink plenty of fluids.  Prune juice may be helpful.  You may use a stool softener, such as Colace (over the counter) 100 mg twice a day.  Use MiraLax  (over the counter) for constipation as needed.   Diet - low sodium heart healthy   Complete by: As directed    Discharge instructions   Complete by: As directed    Ice to the shoulder constantly.  Keep the incision covered and clean and dry for one week, then ok to get it wet in the shower. Leave Aquacel bandage on for one week then ok to remove it. This bandage is waterproof so you CAN shower with it on.   Do exercise as instructed several times per day.  DO NOT reach behind your back or push up out of a chair with the operative arm.  Use a sling while you are up and around for comfort, may remove while seated.  Keep pillow propped behind the  operative elbow.  Follow up with Dr Kay in two weeks in the office, call [phone] for appt   Driving restrictions   Complete by: As directed    No driving   Increase activity slowly as tolerated   Complete by: As directed    Lifting restrictions   Complete by: As directed    Do exercise as instructed several times per day. DO NOT reach behind your back or push up out of a chair with the operative arm. Use a sling while you are up and around for comfort, may remove while seated.  Keep pillow propped behind the operative elbow.   Post-operative opioid taper instructions:   Complete by: As directed    POST-OPERATIVE OPIOID TAPER INSTRUCTIONS: It is important to wean off of your opioid medication as soon as possible. If you do not need pain medication after your surgery it is ok to stop day one. Opioids include: Codeine, Hydrocodone (Norco, Vicodin), Oxycodone (Percocet, oxycontin ) and hydromorphone  amongst others.  Long term and even short term use of opiods can cause: Increased pain response Dependence Constipation Depression Respiratory depression And more.  Withdrawal symptoms can include Flu like symptoms Nausea, vomiting And more Techniques to manage these symptoms Hydrate well Eat regular healthy meals Stay active Use relaxation techniques(deep breathing, meditating, yoga) Do Not substitute Alcohol to help with tapering If you have been on opioids for less than two weeks and do not have pain than it is ok to stop all together.  Plan to wean off of opioids This plan should start within one week post op of your joint replacement. Maintain the same interval or time between taking each dose and first decrease the dose.  Cut the total daily intake of opioids by one tablet each day Next start to increase the time between doses. The last dose that should be eliminated is the evening dose.           Follow-up Information     Kay Kemps, MD. Call in 2 week(s).    Specialty: Orthopedic Surgery Why: please call [phone] for appt in two weeks Contact information: [address] [place] [address] [phone]                  Signed: Valery GORMAN Potters 01/19/2024, 11:20 AM

## 2024-01-19 NOTE — Progress Notes (Signed)
    Subjective:  Patient reports pain as mild.  Denies N/V/CP/SOB. Block is wearing off, subjective sensory change to digits  Objective:   VITALS:   Vitals:   01/18/24 2232 01/19/24 0201 01/19/24 0535 01/19/24 0938  BP: 130/68 119/64 125/62 (!) 131/54  Pulse: 97 83 88 98  Resp:  18    Temp:  97.7 F (36.5 C) 98.2 F (36.8 C)   TempSrc:   Oral   SpO2:  96% 96%   Weight:      Height:        NAD ABD soft LUE: sling in place Dressing taken down, incis c/d/i x2 - Aquacel placed (+) AIN/PIN/U Subjective sensory change R/U/M 2+ radial  Lab Results  Component Value Date   WBC 6.2 01/07/2024   HGB 14.1 01/07/2024   HCT 45.2 01/07/2024   MCV 94.4 01/07/2024   PLT 289 01/07/2024   BMET    Component Value Date/Time   NA 138 01/07/2024 1201   NA 137 11/12/2015 0000   NA 140 05/03/2015 1402   K 4.3 01/07/2024 1201   K 4.0 05/03/2015 1402   CL 104 01/07/2024 1201   CL 105 10/09/2012 1103   CO2 20 (L) 01/07/2024 1201   CO2 25 05/03/2015 1402   GLUCOSE 102 (H) 01/07/2024 1201   GLUCOSE 96 05/03/2015 1402   GLUCOSE 117 (H) 10/09/2012 1103   BUN 30 (H) 01/07/2024 1201   BUN 25 (A) 11/12/2015 0000   BUN 20.6 05/03/2015 1402   CREATININE 1.07 (H) 01/07/2024 1201   CREATININE 1.21 (H) 09/19/2017 0945   CREATININE 1.5 (H) 05/03/2015 1402   CALCIUM  11.3 (H) 01/07/2024 1201   CALCIUM  10.1 05/03/2015 1402   EGFR 36 (L) 05/03/2015 1402   GFRNONAA 54 (L) 01/07/2024 1201   GFRNONAA 46 (L) 09/19/2017 0945     Assessment/Plan: 1 Day Post-Op   Principal Problem:   H/O total shoulder replacement, left   Sling, NWB LUE DVT ppx: early ambulation, SCDs, TEDS PO pain control OT Dispo: d/c home after clears OT   Redell PARAS Zaevion Parke 01/19/2024, 10:25 AM   Redell Shoals, MD [phone] [facility] is now [facility]  [place] [address], [address], [place], [place] [address] Phone: [phone] [email or web address]  [email or web address]

## 2024-01-19 NOTE — Evaluation (Signed)
 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     Clinical Impressions Pt is functioning modified independently in ADLs and independently in mobility. Educated in positioning L UE in bed and chair, sling use, compensatory strategies for ADLs, NWB status of L UE and AROM elbow to hand. Reinforced education with written handout. Pt familiar with shoulder protocol after having had same surgery on her R shoulder in June.      If plan is discharge home, recommend the following:         Functional Status Assessment         Equipment Recommendations   None recommended by OT     Recommendations for Other Services         Precautions/Restrictions   Precautions Precautions: Shoulder Type of Shoulder Precautions: no shoulder AROM/PROM, AROM L elbow Shoulder Interventions: Shoulder sling/immobilizer;Off for dressing/bathing/exercises Precaution Booklet Issued: Yes (comment) Recall of Precautions/Restrictions: Intact Required Braces or Orthoses: Sling Restrictions Weight Bearing Restrictions Per Provider Order: Yes LUE Weight Bearing Per Provider Order: Non weight bearing     Mobility Bed Mobility               General bed mobility comments: plans to sleep in electric lift chair    Transfers Overall transfer level: Independent Equipment used: None                      Balance                                           ADL either performed or assessed with clinical judgement   ADL Overall ADL's : Modified independent                                             Vision Baseline Vision/History: 1 Wears glasses Ability to See in Adequate Light: 0 Adequate Patient Visual  Report: No change from baseline       Perception         Praxis         Pertinent Vitals/Pain       Extremity/Trunk Assessment Upper Extremity Assessment Upper Extremity Assessment: Right hand dominant;LUE deficits/detail LUE Deficits / Details: shoulder immobilized in sling, completed AAROM L elbow to hand LUE: Unable to fully assess due to immobilization LUE Coordination: decreased gross motor   Lower Extremity Assessment Lower Extremity Assessment: Overall WFL for tasks assessed   Cervical / Trunk Assessment Cervical / Trunk Assessment: Normal   Communication Communication Communication: No apparent difficulties   Cognition Arousal: Alert Behavior During Therapy: WFL for tasks assessed/performed Cognition: No apparent impairments                               Following commands: Intact       Cueing  General Comments   Cueing Techniques: Verbal cues      Exercises     Shoulder Instructions      Home Living Family/patient expects to be discharged to:: Private residence  Living Arrangements: Alone Available Help at Discharge: Friend(s);Available PRN/intermittently Type of Home: House Home Access: Stairs to enter [employer] of Steps: 3   Home Layout: One level     Bathroom Shower/Tub: Chief Strategy Officer: Handicapped height     Home Equipment: Shower seat          Prior Functioning/Environment Prior Level of Function : Independent/Modified Independent;Driving               ADLs Comments: plays piano    OT Problem List:     OT Treatment/Interventions:        OT Goals(Current goals can be found in the care plan section)       OT Frequency:       Co-evaluation              AM-PAC OT 6 Clicks Daily Activity     Outcome Measure Help from another person eating meals?: None Help from another person taking care of personal grooming?: None Help from another person toileting, which  includes using toliet, bedpan, or urinal?: None Help from another person bathing (including washing, rinsing, drying)?: None Help from another person to put on and taking off regular upper body clothing?: None Help from another person to put on and taking off regular lower body clothing?: None 6 Click Score: 24   End of Session    Activity Tolerance: Patient tolerated treatment well Patient left: in chair;with call bell/phone within reach  OT Visit Diagnosis: Muscle weakness (generalized) (M62.81)                Time: 8965-8889 OT Time Calculation (min): 36 min Charges:  OT General Charges $OT Visit: 1 Visit OT Evaluation $OT Eval Low Complexity: 1 Low OT Treatments $Self Care/Home Management : 8-22 mins  [name], OTR/L Acute Rehabilitation Services Office: [phone]   [name] 01/19/2024, 11:21 AM

## 2024-01-21 ENCOUNTER — Encounter (HOSPITAL_COMMUNITY): Payer: Self-pay | Admitting: Orthopedic Surgery

## 2024-01-22 LAB — SURGICAL PATHOLOGY

## 2024-02-05 ENCOUNTER — Ambulatory Visit: Admitting: Family Medicine

## 2024-02-22 NOTE — Progress Notes (Signed)
 Deanna Burke                                          MRN: [id number]   02/22/2024   The [facility] Quality Team Specialist reviewed this patient medical record for the purposes of chart review for care gap closure. The following were reviewed: chart review for care gap closure-colorectal cancer screening.    [facility] Quality Team

## 2024-05-07 ENCOUNTER — Other Ambulatory Visit: Payer: Self-pay | Admitting: Family Medicine

## 2024-12-08 ENCOUNTER — Ambulatory Visit
